# Patient Record
Sex: Female | Born: 1955 | Race: Black or African American | Hispanic: No | Marital: Married | Smoking: Never smoker
Health system: Southern US, Community
[De-identification: ages and names within clinical notes are randomized; demographics above are authoritative.]

## PROBLEM LIST (undated history)

## (undated) DIAGNOSIS — I10 Essential (primary) hypertension: Secondary | ICD-10-CM

## (undated) DIAGNOSIS — N189 Chronic kidney disease, unspecified: Secondary | ICD-10-CM

## (undated) DIAGNOSIS — M1990 Unspecified osteoarthritis, unspecified site: Secondary | ICD-10-CM

## (undated) DIAGNOSIS — K5090 Crohn's disease, unspecified, without complications: Secondary | ICD-10-CM

## (undated) DIAGNOSIS — K624 Stenosis of anus and rectum: Secondary | ICD-10-CM

## (undated) DIAGNOSIS — K209 Esophagitis, unspecified without bleeding: Secondary | ICD-10-CM

## (undated) DIAGNOSIS — E785 Hyperlipidemia, unspecified: Secondary | ICD-10-CM

## (undated) DIAGNOSIS — K649 Unspecified hemorrhoids: Secondary | ICD-10-CM

## (undated) DIAGNOSIS — I5189 Other ill-defined heart diseases: Secondary | ICD-10-CM

## (undated) DIAGNOSIS — K219 Gastro-esophageal reflux disease without esophagitis: Secondary | ICD-10-CM

## (undated) DIAGNOSIS — E119 Type 2 diabetes mellitus without complications: Secondary | ICD-10-CM

## (undated) DIAGNOSIS — K449 Diaphragmatic hernia without obstruction or gangrene: Secondary | ICD-10-CM

## (undated) HISTORY — DX: Stenosis of anus and rectum: K62.4

## (undated) HISTORY — DX: Esophagitis, unspecified: K20.9

## (undated) HISTORY — DX: Gastro-esophageal reflux disease without esophagitis: K21.9

## (undated) HISTORY — DX: Unspecified hemorrhoids: K64.9

## (undated) HISTORY — DX: Diaphragmatic hernia without obstruction or gangrene: K44.9

## (undated) HISTORY — DX: Chronic kidney disease, unspecified: N18.9

## (undated) HISTORY — DX: Esophagitis, unspecified without bleeding: K20.90

## (undated) HISTORY — DX: Hyperlipidemia, unspecified: E78.5

## (undated) HISTORY — DX: Other ill-defined heart diseases: I51.89

## (undated) HISTORY — DX: Essential (primary) hypertension: I10

## (undated) HISTORY — DX: Unspecified osteoarthritis, unspecified site: M19.90

## (undated) HISTORY — PX: BREAST BIOPSY: SHX20

## (undated) HISTORY — DX: Crohn's disease, unspecified, without complications: K50.90

---

## 1998-09-08 ENCOUNTER — Other Ambulatory Visit: Admission: RE | Admit: 1998-09-08 | Discharge: 1998-09-08 | Payer: Self-pay | Admitting: Internal Medicine

## 1999-09-10 ENCOUNTER — Other Ambulatory Visit: Admission: RE | Admit: 1999-09-10 | Discharge: 1999-09-10 | Payer: Self-pay | Admitting: Internal Medicine

## 2000-09-12 ENCOUNTER — Other Ambulatory Visit: Admission: RE | Admit: 2000-09-12 | Discharge: 2000-09-12 | Payer: Self-pay | Admitting: Internal Medicine

## 2001-09-13 ENCOUNTER — Other Ambulatory Visit: Admission: RE | Admit: 2001-09-13 | Discharge: 2001-09-13 | Payer: Self-pay | Admitting: Internal Medicine

## 2001-09-22 ENCOUNTER — Encounter: Admission: RE | Admit: 2001-09-22 | Discharge: 2001-12-21 | Payer: Self-pay | Admitting: Internal Medicine

## 2003-01-04 ENCOUNTER — Other Ambulatory Visit: Admission: RE | Admit: 2003-01-04 | Discharge: 2003-01-04 | Payer: Self-pay | Admitting: Internal Medicine

## 2003-03-11 ENCOUNTER — Encounter: Admission: RE | Admit: 2003-03-11 | Discharge: 2003-03-11 | Payer: Self-pay | Admitting: Internal Medicine

## 2003-04-03 ENCOUNTER — Ambulatory Visit (HOSPITAL_COMMUNITY): Admission: RE | Admit: 2003-04-03 | Discharge: 2003-04-03 | Payer: Self-pay | Admitting: Internal Medicine

## 2004-01-29 ENCOUNTER — Other Ambulatory Visit: Admission: RE | Admit: 2004-01-29 | Discharge: 2004-01-29 | Payer: Self-pay | Admitting: Internal Medicine

## 2004-03-11 ENCOUNTER — Ambulatory Visit: Payer: Self-pay | Admitting: Internal Medicine

## 2004-03-24 ENCOUNTER — Ambulatory Visit: Payer: Self-pay | Admitting: Internal Medicine

## 2004-07-21 ENCOUNTER — Ambulatory Visit: Payer: Self-pay | Admitting: Internal Medicine

## 2004-07-29 ENCOUNTER — Encounter: Admission: RE | Admit: 2004-07-29 | Discharge: 2004-07-29 | Payer: Self-pay | Admitting: Internal Medicine

## 2005-03-24 ENCOUNTER — Ambulatory Visit: Payer: Self-pay | Admitting: Internal Medicine

## 2005-05-04 ENCOUNTER — Emergency Department (HOSPITAL_COMMUNITY): Admission: EM | Admit: 2005-05-04 | Discharge: 2005-05-04 | Payer: Self-pay | Admitting: Emergency Medicine

## 2005-05-17 ENCOUNTER — Ambulatory Visit: Payer: Self-pay | Admitting: Internal Medicine

## 2005-05-25 ENCOUNTER — Ambulatory Visit: Payer: Self-pay | Admitting: Hospitalist

## 2006-04-20 ENCOUNTER — Emergency Department (HOSPITAL_COMMUNITY): Admission: EM | Admit: 2006-04-20 | Discharge: 2006-04-20 | Payer: Self-pay | Admitting: Emergency Medicine

## 2006-05-24 ENCOUNTER — Telehealth: Payer: Self-pay | Admitting: *Deleted

## 2006-05-30 DIAGNOSIS — E785 Hyperlipidemia, unspecified: Secondary | ICD-10-CM | POA: Insufficient documentation

## 2006-05-30 DIAGNOSIS — J45909 Unspecified asthma, uncomplicated: Secondary | ICD-10-CM | POA: Insufficient documentation

## 2006-05-30 DIAGNOSIS — I10 Essential (primary) hypertension: Secondary | ICD-10-CM | POA: Insufficient documentation

## 2006-06-01 ENCOUNTER — Ambulatory Visit: Payer: Self-pay | Admitting: Internal Medicine

## 2006-06-01 ENCOUNTER — Encounter (INDEPENDENT_AMBULATORY_CARE_PROVIDER_SITE_OTHER): Payer: Self-pay | Admitting: Unknown Physician Specialty

## 2006-06-01 ENCOUNTER — Ambulatory Visit (HOSPITAL_COMMUNITY): Admission: RE | Admit: 2006-06-01 | Discharge: 2006-06-01 | Payer: Self-pay | Admitting: Internal Medicine

## 2006-06-01 DIAGNOSIS — R809 Proteinuria, unspecified: Secondary | ICD-10-CM | POA: Insufficient documentation

## 2006-06-01 DIAGNOSIS — E119 Type 2 diabetes mellitus without complications: Secondary | ICD-10-CM | POA: Insufficient documentation

## 2006-06-01 DIAGNOSIS — M79609 Pain in unspecified limb: Secondary | ICD-10-CM | POA: Insufficient documentation

## 2006-06-01 HISTORY — DX: Proteinuria, unspecified: R80.9

## 2006-06-01 LAB — CONVERTED CEMR LAB
ALT: 12 units/L (ref 0–35)
AST: 13 units/L (ref 0–37)
Albumin: 4.1 g/dL (ref 3.5–5.2)
Alkaline Phosphatase: 107 units/L (ref 39–117)
BUN: 13 mg/dL (ref 6–23)
CO2: 26 meq/L (ref 19–32)
Calcium: 9.6 mg/dL (ref 8.4–10.5)
Chloride: 102 meq/L (ref 96–112)
Cholesterol: 210 mg/dL — ABNORMAL HIGH (ref 0–200)
Creatinine, Ser: 1.11 mg/dL (ref 0.40–1.20)
Glucose, Bld: 128 mg/dL
Glucose, Bld: 110 mg/dL — ABNORMAL HIGH (ref 70–99)
HDL: 41 mg/dL (ref >39)
Hgb A1c MFr Bld: 6.3 %
LDL Cholesterol: 128 mg/dL — ABNORMAL HIGH (ref 0–99)
Potassium: 4.8 meq/L (ref 3.5–5.3)
Sodium: 141 meq/L (ref 135–145)
Total Bilirubin: 0.3 mg/dL (ref 0.3–1.2)
Total CHOL/HDL Ratio: 5.1
Total Protein: 7.7 g/dL (ref 6.0–8.3)
Triglycerides: 203 mg/dL — ABNORMAL HIGH (ref <150)
VLDL: 41 mg/dL — ABNORMAL HIGH (ref 0–40)

## 2006-06-07 ENCOUNTER — Encounter (INDEPENDENT_AMBULATORY_CARE_PROVIDER_SITE_OTHER): Payer: Self-pay | Admitting: Unknown Physician Specialty

## 2006-06-07 ENCOUNTER — Encounter (INDEPENDENT_AMBULATORY_CARE_PROVIDER_SITE_OTHER): Payer: Self-pay | Admitting: *Deleted

## 2006-06-15 ENCOUNTER — Ambulatory Visit: Payer: Self-pay | Admitting: Internal Medicine

## 2006-06-15 ENCOUNTER — Encounter (INDEPENDENT_AMBULATORY_CARE_PROVIDER_SITE_OTHER): Payer: Self-pay | Admitting: Unknown Physician Specialty

## 2006-08-08 ENCOUNTER — Telehealth: Payer: Self-pay | Admitting: *Deleted

## 2006-12-27 ENCOUNTER — Telehealth (INDEPENDENT_AMBULATORY_CARE_PROVIDER_SITE_OTHER): Payer: Self-pay | Admitting: *Deleted

## 2007-08-03 ENCOUNTER — Telehealth (INDEPENDENT_AMBULATORY_CARE_PROVIDER_SITE_OTHER): Payer: Self-pay | Admitting: *Deleted

## 2007-09-24 ENCOUNTER — Ambulatory Visit (HOSPITAL_COMMUNITY): Admission: RE | Admit: 2007-09-24 | Discharge: 2007-09-24 | Payer: Self-pay | Admitting: Family Medicine

## 2007-12-22 LAB — CONVERTED CEMR LAB: Pap Smear: NORMAL

## 2008-02-29 ENCOUNTER — Ambulatory Visit: Payer: Self-pay | Admitting: *Deleted

## 2008-03-04 ENCOUNTER — Telehealth (INDEPENDENT_AMBULATORY_CARE_PROVIDER_SITE_OTHER): Payer: Self-pay | Admitting: *Deleted

## 2008-03-28 ENCOUNTER — Ambulatory Visit: Payer: Self-pay | Admitting: Diagnostic Radiology

## 2008-03-28 ENCOUNTER — Emergency Department (HOSPITAL_BASED_OUTPATIENT_CLINIC_OR_DEPARTMENT_OTHER): Admission: EM | Admit: 2008-03-28 | Discharge: 2008-03-28 | Payer: Self-pay | Admitting: Emergency Medicine

## 2008-04-02 ENCOUNTER — Ambulatory Visit: Payer: Self-pay | Admitting: *Deleted

## 2008-04-02 LAB — CONVERTED CEMR LAB
ALT: 17 units/L (ref 0–35)
AST: 17 units/L (ref 0–37)
Albumin: 3.6 g/dL (ref 3.5–5.2)
Alkaline Phosphatase: 93 units/L (ref 39–117)
BUN: 12 mg/dL (ref 6–23)
Basophils Absolute: 0 10*3/uL (ref 0.0–0.1)
Basophils Relative: 0.6 % (ref 0.0–3.0)
CO2: 33 meq/L — ABNORMAL HIGH (ref 19–32)
Calcium: 9.8 mg/dL (ref 8.4–10.5)
Chloride: 99 meq/L (ref 96–112)
Cholesterol: 185 mg/dL (ref 0–200)
Creatinine, Ser: 1.1 mg/dL (ref 0.4–1.2)
Creatinine,U: 103 mg/dL
Eosinophils Absolute: 0.2 10*3/uL (ref 0.0–0.7)
Eosinophils Relative: 2.9 % (ref 0.0–5.0)
GFR calc Af Amer: 67 mL/min
GFR calc non Af Amer: 55 mL/min
Glucose, Bld: 133 mg/dL — ABNORMAL HIGH (ref 70–99)
HCT: 38.4 % (ref 36.0–46.0)
HDL: 40.3 mg/dL (ref >39.0)
Hemoglobin: 12.5 g/dL (ref 12.0–15.0)
Hgb A1c MFr Bld: 7 % — ABNORMAL HIGH (ref 4.6–6.0)
LDL Cholesterol: 110 mg/dL — ABNORMAL HIGH (ref 0–99)
Lymphocytes Relative: 38.4 % (ref 12.0–46.0)
MCHC: 32.7 g/dL (ref 30.0–36.0)
MCV: 74.6 fL — ABNORMAL LOW (ref 78.0–100.0)
Microalb Creat Ratio: 2.9 mg/g (ref 0.0–30.0)
Microalb, Ur: 0.3 mg/dL (ref 0.0–1.9)
Monocytes Absolute: 0.3 10*3/uL (ref 0.1–1.0)
Monocytes Relative: 5.7 % (ref 3.0–12.0)
Neutro Abs: 3.1 10*3/uL (ref 1.4–7.7)
Neutrophils Relative %: 52.4 % (ref 43.0–77.0)
Platelets: 267 10*3/uL (ref 150–400)
Potassium: 4.1 meq/L (ref 3.5–5.1)
RBC: 5.14 M/uL — ABNORMAL HIGH (ref 3.87–5.11)
RDW: 14.5 % (ref 11.5–14.6)
Sodium: 138 meq/L (ref 135–145)
TSH: 2.52 microintl units/mL (ref 0.35–5.50)
Total Bilirubin: 0.5 mg/dL (ref 0.3–1.2)
Total CHOL/HDL Ratio: 4.6
Total Protein: 7.7 g/dL (ref 6.0–8.3)
Triglycerides: 176 mg/dL — ABNORMAL HIGH (ref 0–149)
VLDL: 35 mg/dL (ref 0–40)
WBC: 5.8 10*3/uL (ref 4.5–10.5)

## 2008-06-17 ENCOUNTER — Telehealth (INDEPENDENT_AMBULATORY_CARE_PROVIDER_SITE_OTHER): Payer: Self-pay | Admitting: *Deleted

## 2008-07-15 ENCOUNTER — Ambulatory Visit: Payer: Self-pay | Admitting: *Deleted

## 2008-07-15 LAB — CONVERTED CEMR LAB
ALT: 16 units/L (ref 0–35)
AST: 19 units/L (ref 0–37)
Albumin: 3.7 g/dL (ref 3.5–5.2)
Alkaline Phosphatase: 117 units/L (ref 39–117)
BUN: 10 mg/dL (ref 6–23)
CO2: 30 meq/L (ref 19–32)
Calcium: 9.3 mg/dL (ref 8.4–10.5)
Chloride: 103 meq/L (ref 96–112)
Creatinine, Ser: 1 mg/dL (ref 0.4–1.2)
GFR calc non Af Amer: 74.04 mL/min (ref >60)
Glucose, Bld: 93 mg/dL (ref 70–99)
Potassium: 4.4 meq/L (ref 3.5–5.1)
Sodium: 140 meq/L (ref 135–145)
Total Bilirubin: 0.5 mg/dL (ref 0.3–1.2)
Total Protein: 7.3 g/dL (ref 6.0–8.3)

## 2008-07-17 ENCOUNTER — Ambulatory Visit: Payer: Self-pay | Admitting: *Deleted

## 2008-07-17 DIAGNOSIS — Z8669 Personal history of other diseases of the nervous system and sense organs: Secondary | ICD-10-CM

## 2008-07-17 DIAGNOSIS — G43009 Migraine without aura, not intractable, without status migrainosus: Secondary | ICD-10-CM | POA: Insufficient documentation

## 2008-07-17 HISTORY — DX: Personal history of other diseases of the nervous system and sense organs: Z86.69

## 2008-07-29 ENCOUNTER — Ambulatory Visit: Payer: Self-pay | Admitting: *Deleted

## 2008-08-01 ENCOUNTER — Encounter: Payer: Self-pay | Admitting: Internal Medicine

## 2008-08-20 ENCOUNTER — Telehealth: Payer: Self-pay | Admitting: Internal Medicine

## 2008-09-03 ENCOUNTER — Ambulatory Visit: Payer: Self-pay | Admitting: Family Medicine

## 2008-09-03 ENCOUNTER — Telehealth: Payer: Self-pay | Admitting: Family Medicine

## 2008-09-03 LAB — CONVERTED CEMR LAB
ALT: 17 units/L (ref 0–35)
AST: 19 units/L (ref 0–37)
Albumin: 3.5 g/dL (ref 3.5–5.2)
Alkaline Phosphatase: 124 units/L — ABNORMAL HIGH (ref 39–117)
BUN: 13 mg/dL (ref 6–23)
CO2: 32 meq/L (ref 19–32)
Calcium: 9.3 mg/dL (ref 8.4–10.5)
Chloride: 103 meq/L (ref 96–112)
Cholesterol: 165 mg/dL (ref 0–200)
Creatinine, Ser: 0.9 mg/dL (ref 0.4–1.2)
GFR calc non Af Amer: 83.57 mL/min (ref >60)
Glucose, Bld: 126 mg/dL — ABNORMAL HIGH (ref 70–99)
HDL: 38.5 mg/dL — ABNORMAL LOW (ref >39.00)
Hgb A1c MFr Bld: 6.8 % — ABNORMAL HIGH (ref 4.6–6.5)
LDL Cholesterol: 102 mg/dL — ABNORMAL HIGH (ref 0–99)
Potassium: 4.2 meq/L (ref 3.5–5.1)
Sodium: 141 meq/L (ref 135–145)
Total Bilirubin: 0.4 mg/dL (ref 0.3–1.2)
Total CHOL/HDL Ratio: 4
Total Protein: 7.5 g/dL (ref 6.0–8.3)
Triglycerides: 124 mg/dL (ref 0.0–149.0)
VLDL: 24.8 mg/dL (ref 0.0–40.0)

## 2008-09-04 ENCOUNTER — Telehealth (INDEPENDENT_AMBULATORY_CARE_PROVIDER_SITE_OTHER): Payer: Self-pay | Admitting: *Deleted

## 2009-01-31 ENCOUNTER — Other Ambulatory Visit: Admission: RE | Admit: 2009-01-31 | Discharge: 2009-01-31 | Payer: Self-pay | Admitting: Family Medicine

## 2009-01-31 ENCOUNTER — Encounter: Payer: Self-pay | Admitting: Family Medicine

## 2009-01-31 ENCOUNTER — Ambulatory Visit: Payer: Self-pay | Admitting: Family Medicine

## 2009-02-05 ENCOUNTER — Telehealth (INDEPENDENT_AMBULATORY_CARE_PROVIDER_SITE_OTHER): Payer: Self-pay | Admitting: *Deleted

## 2009-04-03 ENCOUNTER — Telehealth (INDEPENDENT_AMBULATORY_CARE_PROVIDER_SITE_OTHER): Payer: Self-pay | Admitting: *Deleted

## 2009-06-03 ENCOUNTER — Ambulatory Visit: Payer: Self-pay | Admitting: Family Medicine

## 2009-06-03 ENCOUNTER — Encounter: Payer: Self-pay | Admitting: Family Medicine

## 2009-06-03 DIAGNOSIS — R350 Frequency of micturition: Secondary | ICD-10-CM | POA: Insufficient documentation

## 2009-06-03 DIAGNOSIS — R079 Chest pain, unspecified: Secondary | ICD-10-CM | POA: Insufficient documentation

## 2009-06-03 LAB — CONVERTED CEMR LAB
Bilirubin Urine: NEGATIVE
Glucose, Urine, Semiquant: NEGATIVE
Ketones, urine, test strip: NEGATIVE
Nitrite: NEGATIVE
Protein, U semiquant: NEGATIVE
Specific Gravity, Urine: 1.01
Urobilinogen, UA: 0.2
pH: 6

## 2009-06-06 ENCOUNTER — Telehealth (INDEPENDENT_AMBULATORY_CARE_PROVIDER_SITE_OTHER): Payer: Self-pay | Admitting: *Deleted

## 2009-06-10 ENCOUNTER — Encounter (INDEPENDENT_AMBULATORY_CARE_PROVIDER_SITE_OTHER): Payer: Self-pay | Admitting: *Deleted

## 2009-06-10 DIAGNOSIS — N390 Urinary tract infection, site not specified: Secondary | ICD-10-CM | POA: Insufficient documentation

## 2009-06-10 LAB — CONVERTED CEMR LAB
ALT: 17 units/L (ref 0–35)
AST: 14 units/L (ref 0–37)
Albumin: 3.8 g/dL (ref 3.5–5.2)
Alkaline Phosphatase: 119 units/L — ABNORMAL HIGH (ref 39–117)
BUN: 9 mg/dL (ref 6–23)
Bilirubin, Direct: 0 mg/dL (ref 0.0–0.3)
CK-MB: 1.6 ng/mL (ref 0.3–4.0)
CO2: 32 meq/L (ref 19–32)
Calcium: 9.6 mg/dL (ref 8.4–10.5)
Chloride: 108 meq/L (ref 96–112)
Cholesterol: 171 mg/dL (ref 0–200)
Creatinine, Ser: 0.9 mg/dL (ref 0.4–1.2)
Creatinine,U: 134.2 mg/dL
GFR calc non Af Amer: 83.34 mL/min (ref >60)
Glucose, Bld: 112 mg/dL — ABNORMAL HIGH (ref 70–99)
HDL: 49.6 mg/dL (ref >39.00)
Hgb A1c MFr Bld: 7 % — ABNORMAL HIGH (ref 4.6–6.5)
LDL Cholesterol: 100 mg/dL — ABNORMAL HIGH (ref 0–99)
Microalb Creat Ratio: 20.9 mg/g (ref 0.0–30.0)
Microalb, Ur: 2.8 mg/dL — ABNORMAL HIGH (ref 0.0–1.9)
Potassium: 4.3 meq/L (ref 3.5–5.1)
Relative Index: 1.7 (ref 0.0–2.5)
Sodium: 145 meq/L (ref 135–145)
Total Bilirubin: 0.4 mg/dL (ref 0.3–1.2)
Total CHOL/HDL Ratio: 3
Total CK: 96 units/L (ref 7–177)
Total Protein: 8.6 g/dL — ABNORMAL HIGH (ref 6.0–8.3)
Triglycerides: 108 mg/dL (ref 0.0–149.0)
VLDL: 21.6 mg/dL (ref 0.0–40.0)

## 2009-06-23 ENCOUNTER — Ambulatory Visit: Payer: Self-pay | Admitting: Internal Medicine

## 2009-06-23 ENCOUNTER — Ambulatory Visit: Payer: Self-pay

## 2009-06-23 ENCOUNTER — Ambulatory Visit (HOSPITAL_COMMUNITY): Admission: RE | Admit: 2009-06-23 | Discharge: 2009-06-23 | Payer: Self-pay | Admitting: Family Medicine

## 2009-06-23 ENCOUNTER — Encounter: Payer: Self-pay | Admitting: Family Medicine

## 2009-06-29 DIAGNOSIS — I519 Heart disease, unspecified: Secondary | ICD-10-CM | POA: Insufficient documentation

## 2009-06-30 ENCOUNTER — Encounter (INDEPENDENT_AMBULATORY_CARE_PROVIDER_SITE_OTHER): Payer: Self-pay | Admitting: *Deleted

## 2009-07-17 ENCOUNTER — Ambulatory Visit: Payer: Self-pay | Admitting: Cardiology

## 2009-07-17 DIAGNOSIS — E669 Obesity, unspecified: Secondary | ICD-10-CM | POA: Insufficient documentation

## 2009-07-17 DIAGNOSIS — R0600 Dyspnea, unspecified: Secondary | ICD-10-CM | POA: Insufficient documentation

## 2009-07-20 LAB — CONVERTED CEMR LAB: Pro B Natriuretic peptide (BNP): 22 pg/mL (ref 0.0–100.0)

## 2009-07-28 ENCOUNTER — Telehealth (INDEPENDENT_AMBULATORY_CARE_PROVIDER_SITE_OTHER): Payer: Self-pay | Admitting: *Deleted

## 2009-07-29 ENCOUNTER — Ambulatory Visit: Payer: Self-pay

## 2009-07-29 ENCOUNTER — Encounter (HOSPITAL_COMMUNITY): Admission: RE | Admit: 2009-07-29 | Discharge: 2009-09-24 | Payer: Self-pay | Admitting: Cardiology

## 2009-07-29 ENCOUNTER — Ambulatory Visit: Payer: Self-pay | Admitting: Cardiology

## 2009-07-29 HISTORY — PX: CARDIOVASCULAR STRESS TEST: SHX262

## 2009-08-12 ENCOUNTER — Telehealth: Payer: Self-pay | Admitting: Family Medicine

## 2009-11-17 ENCOUNTER — Telehealth (INDEPENDENT_AMBULATORY_CARE_PROVIDER_SITE_OTHER): Payer: Self-pay | Admitting: *Deleted

## 2010-04-22 ENCOUNTER — Telehealth (INDEPENDENT_AMBULATORY_CARE_PROVIDER_SITE_OTHER): Payer: Self-pay | Admitting: *Deleted

## 2010-04-28 ENCOUNTER — Ambulatory Visit
Admission: RE | Admit: 2010-04-28 | Discharge: 2010-04-28 | Payer: Self-pay | Source: Home / Self Care | Attending: Family Medicine | Admitting: Family Medicine

## 2010-04-28 ENCOUNTER — Other Ambulatory Visit: Payer: Self-pay | Admitting: Family Medicine

## 2010-04-28 ENCOUNTER — Encounter: Payer: Self-pay | Admitting: Family Medicine

## 2010-04-28 LAB — BASIC METABOLIC PANEL
BUN: 14 mg/dL (ref 6–23)
CO2: 31 mEq/L (ref 19–32)
Calcium: 9.3 mg/dL (ref 8.4–10.5)
Chloride: 104 mEq/L (ref 96–112)
Creatinine, Ser: 0.8 mg/dL (ref 0.4–1.2)
GFR: 89.95 mL/min (ref >60.00)
Glucose, Bld: 90 mg/dL (ref 70–99)
Potassium: 3.8 mEq/L (ref 3.5–5.1)
Sodium: 141 mEq/L (ref 135–145)

## 2010-04-28 LAB — MICROALBUMIN / CREATININE URINE RATIO
Creatinine,U: 96.1 mg/dL
Microalb Creat Ratio: 7.2 mg/g (ref 0.0–30.0)
Microalb, Ur: 6.9 mg/dL — ABNORMAL HIGH (ref 0.0–1.9)

## 2010-04-28 LAB — LIPID PANEL
Cholesterol: 172 mg/dL (ref 0–200)
HDL: 37.9 mg/dL — ABNORMAL LOW (ref >39.00)
LDL Cholesterol: 114 mg/dL — ABNORMAL HIGH (ref 0–99)
Total CHOL/HDL Ratio: 5
Triglycerides: 102 mg/dL (ref 0.0–149.0)
VLDL: 20.4 mg/dL (ref 0.0–40.0)

## 2010-04-28 LAB — CBC WITH DIFFERENTIAL/PLATELET
Basophils Absolute: 0 10*3/uL (ref 0.0–0.1)
Basophils Relative: 0.2 % (ref 0.0–3.0)
Eosinophils Absolute: 0.2 10*3/uL (ref 0.0–0.7)
Eosinophils Relative: 3 % (ref 0.0–5.0)
HCT: 38.5 % (ref 36.0–46.0)
Hemoglobin: 12.2 g/dL (ref 12.0–15.0)
Lymphocytes Relative: 38.6 % (ref 12.0–46.0)
Lymphs Abs: 2 10*3/uL (ref 0.7–4.0)
MCHC: 31.7 g/dL (ref 30.0–36.0)
MCV: 74.1 fl — ABNORMAL LOW (ref 78.0–100.0)
Monocytes Absolute: 0.3 10*3/uL (ref 0.1–1.0)
Monocytes Relative: 5.7 % (ref 3.0–12.0)
Neutro Abs: 2.8 10*3/uL (ref 1.4–7.7)
Neutrophils Relative %: 52.5 % (ref 43.0–77.0)
Platelets: 312 10*3/uL (ref 150.0–400.0)
RBC: 5.19 Mil/uL — ABNORMAL HIGH (ref 3.87–5.11)
RDW: 16.7 % — ABNORMAL HIGH (ref 11.5–14.6)
WBC: 5.3 10*3/uL (ref 4.5–10.5)

## 2010-04-28 LAB — HEPATIC FUNCTION PANEL
ALT: 16 U/L (ref 0–35)
AST: 17 U/L (ref 0–37)
Albumin: 3.7 g/dL (ref 3.5–5.2)
Alkaline Phosphatase: 120 U/L — ABNORMAL HIGH (ref 39–117)
Bilirubin, Direct: 0 mg/dL (ref 0.0–0.3)
Total Bilirubin: 0.6 mg/dL (ref 0.3–1.2)
Total Protein: 7.5 g/dL (ref 6.0–8.3)

## 2010-04-28 LAB — CONVERTED CEMR LAB: Blood Glucose, Fingerstick: 115

## 2010-04-28 LAB — HEMOGLOBIN A1C: Hgb A1c MFr Bld: 7.1 % — ABNORMAL HIGH (ref 4.6–6.5)

## 2010-04-28 LAB — TSH: TSH: 1.47 u[IU]/mL (ref 0.35–5.50)

## 2010-04-29 ENCOUNTER — Telehealth (INDEPENDENT_AMBULATORY_CARE_PROVIDER_SITE_OTHER): Payer: Self-pay | Admitting: *Deleted

## 2010-05-15 ENCOUNTER — Ambulatory Visit
Admission: RE | Admit: 2010-05-15 | Discharge: 2010-05-15 | Payer: Self-pay | Source: Home / Self Care | Attending: Family Medicine | Admitting: Family Medicine

## 2010-05-15 ENCOUNTER — Encounter: Payer: Self-pay | Admitting: Family Medicine

## 2010-05-15 ENCOUNTER — Other Ambulatory Visit: Payer: Self-pay | Admitting: Family Medicine

## 2010-05-15 DIAGNOSIS — R740 Nonspecific elevation of levels of transaminase and lactic acid dehydrogenase [LDH]: Secondary | ICD-10-CM

## 2010-05-15 LAB — HEPATIC FUNCTION PANEL
ALT: 17 U/L (ref 0–35)
AST: 17 U/L (ref 0–37)
Albumin: 3.8 g/dL (ref 3.5–5.2)
Alkaline Phosphatase: 143 U/L — ABNORMAL HIGH (ref 39–117)
Bilirubin, Direct: 0.1 mg/dL (ref 0.0–0.3)
Total Bilirubin: 0.3 mg/dL (ref 0.3–1.2)
Total Protein: 7.7 g/dL (ref 6.0–8.3)

## 2010-05-17 ENCOUNTER — Encounter: Payer: Self-pay | Admitting: Family Medicine

## 2010-05-20 ENCOUNTER — Encounter
Admission: RE | Admit: 2010-05-20 | Discharge: 2010-05-20 | Payer: Self-pay | Source: Home / Self Care | Attending: Family Medicine | Admitting: Family Medicine

## 2010-05-20 ENCOUNTER — Telehealth: Payer: Self-pay | Admitting: Family Medicine

## 2010-05-24 LAB — CONVERTED CEMR LAB
ALT: 15 units/L (ref 0–35)
AST: 18 units/L (ref 0–37)
Albumin: 3.7 g/dL (ref 3.5–5.2)
Alkaline Phosphatase: 113 units/L (ref 39–117)
BUN: 9 mg/dL (ref 6–23)
Basophils Absolute: 0 10*3/uL (ref 0.0–0.1)
Basophils Relative: 0.6 % (ref 0.0–3.0)
Bilirubin, Direct: 0.1 mg/dL (ref 0.0–0.3)
CO2: 24 meq/L (ref 19–32)
Calcium: 9.5 mg/dL (ref 8.4–10.5)
Chloride: 106 meq/L (ref 96–112)
Cholesterol: 173 mg/dL (ref 0–200)
Creatinine, Ser: 1 mg/dL (ref 0.4–1.2)
Eosinophils Absolute: 0.1 10*3/uL (ref 0.0–0.7)
Eosinophils Relative: 2.1 % (ref 0.0–5.0)
GFR calc non Af Amer: 73.89 mL/min (ref >60)
Glucose, Bld: 114 mg/dL — ABNORMAL HIGH (ref 70–99)
HCT: 37.2 % (ref 36.0–46.0)
HDL: 37.5 mg/dL — ABNORMAL LOW (ref >39.00)
Hemoglobin: 11.9 g/dL — ABNORMAL LOW (ref 12.0–15.0)
Hgb A1c MFr Bld: 6.8 % — ABNORMAL HIGH (ref 4.6–6.5)
LDL Cholesterol: 120 mg/dL — ABNORMAL HIGH (ref 0–99)
Lymphocytes Relative: 38.7 % (ref 12.0–46.0)
Lymphs Abs: 2 10*3/uL (ref 0.7–4.0)
MCHC: 32 g/dL (ref 30.0–36.0)
MCV: 75.4 fL — ABNORMAL LOW (ref 78.0–100.0)
Monocytes Absolute: 0.4 10*3/uL (ref 0.1–1.0)
Monocytes Relative: 7.3 % (ref 3.0–12.0)
Neutro Abs: 2.6 10*3/uL (ref 1.4–7.7)
Neutrophils Relative %: 51.3 % (ref 43.0–77.0)
Platelets: 268 10*3/uL (ref 150.0–400.0)
Potassium: 4.5 meq/L (ref 3.5–5.1)
RBC: 4.94 M/uL (ref 3.87–5.11)
RDW: 14.5 % (ref 11.5–14.6)
Sodium: 142 meq/L (ref 135–145)
TSH: 2.03 microintl units/mL (ref 0.35–5.50)
Total Bilirubin: 0.5 mg/dL (ref 0.3–1.2)
Total CHOL/HDL Ratio: 5
Total Protein: 7.1 g/dL (ref 6.0–8.3)
Triglycerides: 78 mg/dL (ref 0.0–149.0)
VLDL: 15.6 mg/dL (ref 0.0–40.0)
WBC: 5.1 10*3/uL (ref 4.5–10.5)

## 2010-05-28 NOTE — Progress Notes (Signed)
  Phone Note Call from Patient   Summary of Call: Pt still symptompatic- final urine result not back. Per dr Etter Sjogren send in cipro 500 mg two times a day.     New/Updated Medications: CIPRO 500 MG TABS (CIPROFLOXACIN HCL) 1 by mouth two times a day for 5 days. Prescriptions: CIPRO 500 MG TABS (CIPROFLOXACIN HCL) 1 by mouth two times a day for 5 days.  #10 x 0   Entered by:   Allyn Kenner CMA   Authorized by:   Garnet Koyanagi DO   Signed by:   Allyn Kenner CMA on 06/06/2009   Method used:   Electronically to        [facility].* (retail)       [phone]       [place], [place]  [address]       Ph: [phone]       Fax: [phone]   RxID:   [id number]

## 2010-05-28 NOTE — Assessment & Plan Note (Signed)
Summary: 2 WEEK RTO/KN   Vital Signs:  Patient profile:   55 year old female Weight:      223.4 pounds Pulse rate:   88 / minute Pulse rhythm:   regular BP sitting:   150 / 70  (left arm) Cuff size:   large  Vitals Entered By: Aron Baba CMA Deborra Medina) (May 15, 2010 9:38 AM) CC: 2 week BP f/u--thinks HCTZ may be causing chest discomfort   History of Present Illness: Pt here for bp check.  Pt states she was having chest pain on R side chest.  When she stopped all her meds she never had chest pain but had it prior to stopping everything --- has seen Dr Percival Spanish in the past.   Pt thinks it may be one of her meds.    Current Medications (verified): 1)  Centrum  Tabs (Multiple Vitamins-Minerals) .... Take 1 Tablet By Mouth Once A Day 2)  Kombiglyze Xr 08-998 Mg Xr24h-Tab (Saxagliptin-Metformin) .... Take 1 By Mouth Once Daily 3)  Ventolin Hfa 108 (90 Base) Mcg/act Aers (Albuterol Sulfate) .Marland Kitchen.. 1-2 Puffs Every 3-4 Hours As Needed Shortness of Breath 4)  Qvar 40 Mcg/act Aers (Beclomethasone Dipropionate) .Marland Kitchen.. 1 Puff Two Times A Day - Rinse Mouth After Use 5)  Bayer Low Strength 81 Mg Tbec (Aspirin) .... Take 1 Tablet By Mouth Once A Day 6)  Cozaar 100 Mg Tabs (Losartan Potassium) .Marland Kitchen.. 1 Tab By Mouth Daily 7)  Hydrochlorothiazide 12.5 Mg  Tabs (Hydrochlorothiazide) .... Take 1 Tab  By Mouth Every Morning 8)  Freestyle Freedom Lite Strips and Lancets .... Accu Check Two Times A Day 9)  Welchol 3.75 Gm Pack (Colesevelam Hcl) .Marland Kitchen.. 1 Packet Once Daily 10)  Alavert 10 Mg Tabs (Loratadine) .Marland Kitchen.. 1 By Mouth Daily 11)  Fish Oil   Oil (Fish Oil) .Marland Kitchen.. 1 Po Daily 12)  Vitamin B Complex-C   Caps (B Complex-C) .Marland Kitchen.. 1 By Mouth Daily 13)  Vitamin B-12 100 Mcg Tabs (Cyanocobalamin) .Marland Kitchen.. 1 By Mouth Daily 14)  Lipitor 20 Mg Tabs (Atorvastatin Calcium) .... Take 1 By Mouth At Bedtime 15)  Toprol Xl 50 Mg Xr24h-Tab (Metoprolol Succinate) .Marland Kitchen.. 1 By Mouth Once Daily  Allergies (verified): 1)  !  Lisinopril 2)  Pravachol  Past History:  Past Medical History: Last updated: 07/16/2009 Chronic renal insuff Cr 1.4 in 2/68 DIASTOLIC DYSFUNCTION (TMH-962.2) CHEST PAIN UNSPECIFIED (ICD-786.50) HYPERTENSION (ICD-401.9) HYPERLIPIDEMIA (ICD-272.4)   stopped meds due to Side Effect DM (ICD-250.00)  HbA1c 6.2 in 1/07 ASTHMA (ICD-493.90) UTI (ICD-599.0) FREQUENCY, URINARY (ICD-788.41) COMMON MIGRAINE (ICD-346.10) FOOT PAIN (ICD-729.5) THUMB PAIN, BILATERAL (ICD-729.5)  Past Surgical History: Last updated: 07/17/2009 C-section  Family History: Last updated: 07/17/2009 Mother - HTN, CVA at the age of 46s Aunt - s/p bil amputation due to DM No early heart disease  Social History: Last updated: 02/29/2008 Occupation:customer service in medical records Married 2 children  Risk Factors: Alcohol Use: 0 (01/31/2009) Caffeine Use: 0 (02/29/2008) Exercise: no (01/31/2009)  Risk Factors: Smoking Status: never (01/31/2009) Passive Smoke Exposure: no (02/29/2008)  Family History: Reviewed history from 07/17/2009 and no changes required. Mother - HTN, CVA at the age of 79s Aunt - s/p bil amputation due to DM No early heart disease  Social History: Reviewed history from 02/29/2008 and no changes required. 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 Lungs:  Normal respiratory effort, chest expands symmetrically. Lungs are clear to auscultation, no crackles or wheezes. Heart:  normal rate and no murmur.   Extremities:  No clubbing, cyanosis, edema, or deformity noted with normal full range of motion of all joints.   Psych:  Oriented X3 and normally interactive.     Impression & Recommendations:  Problem # 1:  HYPERTENSION  (ICD-401.9) stop norvasc ----take metoprolol The following medications were removed from the medication list:    Norvasc 10 Mg Tabs (Amlodipine besylate) .Marland Kitchen... 1 by mouth once daily Her updated medication list for this problem includes:    Cozaar 100 Mg Tabs (Losartan potassium) .Marland Kitchen... 1 tab by mouth daily    Hydrochlorothiazide 12.5 Mg Tabs (Hydrochlorothiazide) .Marland Kitchen... Take 1 tab  by mouth every morning    Toprol Xl 50 Mg Xr24h-tab (Metoprolol succinate) .Marland Kitchen... 1 by mouth once daily  BP today: 150/70 Prior BP: 160/102 (04/28/2010)  Labs Reviewed: K+: 3.8 (04/28/2010) Creat: : 0.8 (04/28/2010)   Chol: 172 (04/28/2010)   HDL: 37.90 (04/28/2010)   LDL: 114 (04/28/2010)   TG: 102.0 (04/28/2010)  Orders: EKG w/ Interpretation (93000)  Problem # 2:  CHEST PAIN UNSPECIFIED (ICD-786.50)  Orders: EKG w/ Interpretation (93000)  Complete Medication List: 1)  Centrum Tabs (Multiple vitamins-minerals) .... Take 1 tablet by mouth once a day 2)  Kombiglyze Xr 08-998 Mg Xr24h-tab (Saxagliptin-metformin) .... Take 1 by mouth once daily 3)  Ventolin Hfa 108 (90 Base) Mcg/act Aers (Albuterol sulfate) .Marland Kitchen.. 1-2 puffs every 3-4 hours as needed shortness of breath 4)  Qvar 40 Mcg/act Aers (Beclomethasone dipropionate) .Marland Kitchen.. 1 puff two times a day - rinse mouth after use 5)  Bayer Low Strength 81 Mg Tbec (Aspirin) .... Take 1 tablet by mouth once a day 6)  Cozaar 100 Mg Tabs (Losartan potassium) .Marland Kitchen.. 1 tab by mouth daily 7)  Hydrochlorothiazide 12.5 Mg Tabs (Hydrochlorothiazide) .... Take 1 tab  by mouth every morning 8)  Freestyle Freedom Lite Strips and Lancets  .... Accu check two times a day 9)  Welchol 3.75 Gm Pack (Colesevelam hcl) .Marland Kitchen.. 1 packet once daily 10)  Alavert 10 Mg Tabs (Loratadine) .Marland Kitchen.. 1 by mouth daily 11)  Fish Oil Oil (Fish oil) .Marland Kitchen.. 1 po daily 12)  Vitamin B Complex-c Caps (B complex-c) .Marland Kitchen.. 1 by mouth daily 13)  Vitamin B-12 100 Mcg Tabs (Cyanocobalamin) .Marland Kitchen.. 1 by mouth daily 14)   Lipitor 20 Mg Tabs (Atorvastatin calcium) .... Take 1 by mouth at bedtime 15)  Toprol Xl 50 Mg Xr24h-tab (Metoprolol succinate) .Marland Kitchen.. 1 by mouth once daily  Other Orders: Venipuncture (88916) TLB-Hepatic/Liver Function Pnl (80076-HEPATIC) T- * Misc. Laboratory test [phone]) Specimen Handling [phone])  Patient Instructions: 1)  Please schedule a follow-up appointment in 2 weeks.  2)  Stop norvasc --- take metoprolol Prescriptions: TOPROL XL 50 MG XR24H-TAB (METOPROLOL SUCCINATE) 1 by mouth once daily  #30 x 2   Entered and Authorized by:   Garnet Koyanagi DO   Signed by:   Garnet Koyanagi DO on 05/15/2010   Method used:   Electronically to        [facility].* (retail)       [phone]       [place], [place]  [address]       Ph: [phone]       Fax: [phone]   RxID:   [id number]  Orders Added: 1)  Venipuncture [75830] 2)  TLB-Hepatic/Liver Function Pnl [80076-HEPATIC] 3)  T- * Misc. Laboratory test [99999] 4)  Specimen Handling [99000] 5)  Est. Patient Level III [74600] 6)  EKG w/ Interpretation [93000]

## 2010-05-28 NOTE — Miscellaneous (Signed)
   Clinical Lists Changes  Problems: Added new problem of UTI (ICD-599.0) Orders: Added new Test order of T-Culture, Urine ([id number]) - Signed

## 2010-05-28 NOTE — Assessment & Plan Note (Signed)
Summary: Cardiology Nuclear Study  Nuclear Med Background Indications for Stress Test: Evaluation for Ischemia   History: Asthma, Echo  History Comments: 2/11 Echo:EF=60-65%   Symptoms: Chest Pain, Chest Pain with Exertion, DOE, Fatigue  Symptoms Comments: CP>back. Last episode of CP:2 weeks ago.   Nuclear Pre-Procedure Cardiac Risk Factors: Hypertension, Lipids, NIDDM, Obesity Caffeine/Decaff Intake: None NPO After: 7:30 PM Lungs: Clear.  Ventolin inhaler used prior to exercise.  O2 Sat 98% on RA. IV 0.9% NS with Angio Cath: 22g     IV Site: (R) AC IV Started by: Irven Baltimore RN Chest Size (in) 42     Cup Size D     Height (in): 60 Weight (lb): 218 BMI: 42.73  Nuclear Med Study 1 or 2 day study:  1 day     Stress Test Type:  Stress Reading MD:  Kirk Ruths, MD     Referring MD:  Minus Breeding, MD Resting Radionuclide:  Technetium 38mTetrofosmin     Resting Radionuclide Dose:  11 mCi  Stress Radionuclide:  Technetium 971metrofosmin     Stress Radionuclide Dose:  33 mCi   Stress Protocol Exercise Time (min):  5:30 min     Max HR:  160 bpm     Predicted Max HR:  16423pm  Max Systolic BP: 15953m Hg     Percent Max HR:  97.56 %     METS: 7.0 Rate Pressure Product:  [id number]  Stress Test Technologist:  ShValetta FullerMA-N     Nuclear Technologist:  StCharlton AmorNMT  Rest Procedure  Myocardial perfusion imaging was performed at rest 45 minutes following the intravenous administration of Myoview Technetium 9925mtrofosmin.  Stress Procedure  The patient exercised for 5:30.  The patient stopped due to fatigue.  She c/o nonspecific chest pain, 7/10, immediately post exercise. There were no diagnostic ST-T wave changes.  Myoview was injected at peak exercise and myocardial perfusion imaging was performed after a brief delay.  QPS Raw Data Images:  Mild breast attenuation.  Normal left ventricular size. Stress Images:  There is decreased uptake in the anterior  wall. Rest Images:  There is decreased uptake in the anterior wall. Subtraction (SDS):  There is a fixed anterior defect that is most consistent with breast attenuation. Transient Ischemic Dilatation:  .94  (Normal <1.22)  Lung/Heart Ratio:  .23  (Normal <0.45)  Quantitative Gated Spect Images QGS EDV:  70 ml QGS ESV:  21 ml QGS EF:  70 % QGS cine images:  Normal wall motion.   Overall Impression  Exercise Capacity: Fair exercise capacity. BP Response: Normal blood pressure response. Clinical Symptoms: There is  chest pain ECG Impression: Insignificant upsloping ST segment depression. Overall Impression: There is breast attenuation but no sign of scar or ischemia.  Appended Document: Cardiology Nuclear Study [place].  No evidence of ischemia.  Appended Document: Cardiology Nuclear Study pt aware of results

## 2010-05-28 NOTE — Progress Notes (Signed)
Summary: Results--   Phone Note Outgoing Call   Call placed by: Aron Baba CMA Deborra Medina),  April 29, 2010 2:01 PM Call placed to: Patient Details for Reason: Pt needs kombiglyza  08/998 1 by mouth once daily #30  2 refills----give coupon with rx alk phos high--  repeat lfts,  alk phos fractionated. GGT  790.4 Ideally your LDL (bad cholesterol) should be <_70___, your HDL (good cholesterol) should be >_40__ and your triglycerides should be< 150.  Diet and exercise will increase HDL and decrease the LDL and triglycerides. Read Dr. Langston Masker book--Eat Drink and Be Healthy.  We will recheck labs in __3_ months. Start Lipitor 20 mg #30  1 by mouth at bedtime ,  2 refills 250.00  272.4    hgba1c, hep, bmp, lipid   Summary of Call: Discuss with patient, Rx sent, copy of labs and coupon placed up front for pick-up.............Marland KitchenFelecia Deloach CMA  April 29, 2010 4:51 PM     New/Updated Medications: KOMBIGLYZE XR 08-998 MG XR24H-TAB (SAXAGLIPTIN-METFORMIN) Take 1 by mouth once daily LIPITOR 20 MG TABS (ATORVASTATIN CALCIUM) Take 1 by mouth at bedtime Prescriptions: LIPITOR 20 MG TABS (ATORVASTATIN CALCIUM) Take 1 by mouth at bedtime  #30 x 2   Entered by:   Rolla Flatten CMA   Authorized by:   Garnet Koyanagi DO   Signed by:   Rolla Flatten CMA on 04/29/2010   Method used:   Faxed to ...       [facility].* (retail)       [phone]       [place], [place]  [address]       Ph: [phone]       Fax: [phone]   RxID:   [id number] KOMBIGLYZE XR 08-998 MG XR24H-TAB (SAXAGLIPTIN-METFORMIN) Take 1 by mouth once daily  #30 x 2   Entered by:   Rolla Flatten CMA   Authorized by:   Garnet Koyanagi DO   Signed by:   Rolla Flatten CMA on 04/29/2010   Method used:   Faxed to ...       [facility].* (retail)       [phone]       [place], [place]  [address]       Ph: [phone]       Fax:  [phone]   RxID:   [id number]

## 2010-05-28 NOTE — Progress Notes (Signed)
Summary: REFILL REQUEST  Phone Note Refill Request Call back at [phone] Message from:  Pharmacy on April 22, 2010 9:33 AM  Refills Requested: Medication #1:  HYDROCHLOROTHIAZIDE 12.5 MG  TABS Take 1 tab  by mouth every morning   Dosage confirmed as above?Dosage Confirmed   Supply Requested: 1 month [place]  Next Appointment Scheduled: 1.3.12 Initial call taken by: Osborn Coho,  April 22, 2010 9:34 AM    Prescriptions: HYDROCHLOROTHIAZIDE 12.5 MG  TABS (HYDROCHLOROTHIAZIDE) Take 1 tab  by mouth every morning  #30 x 0   Entered by:   Malachi Bonds CMA   Authorized by:   Annye Asa MD   Signed by:   Malachi Bonds CMA on 04/22/2010   Method used:   Electronically to        [facility] (retail)       [phone]       [place], [place]  [address]       Ph: [phone]       Fax: [phone]   RxID:   [id number]

## 2010-05-28 NOTE — Progress Notes (Signed)
Summary: Wants to discuss meds  Phone Note Call from Patient   Caller: Patient Details for Reason: wants to discuss meds Summary of Call: call from patient wanted to schedule an appt with Dr.Lowne to discuss BP meds. Stated her BP has been running high, and she thinks she needs more affordable meds as well, I offered the patient and appt to come in a seen another provider due to Dr. Etter Sjogren being out of town and the patient declined an OV this week. Stated she can wait until next week. Her BP has been 160/90, I advised that is elevated and if she starts to feel bad before her appt, she will need to be seen here, go to the ED or urgent care, she stated she will be ok until seen on tuesday but if she starts to feel bad she will go and be seen at the hospt. appt scheduled 04/28/10 @ 10:45... Initial call taken by: Aron Baba CMA Deborra Medina),  April 22, 2010 8:57 AM

## 2010-05-28 NOTE — Progress Notes (Signed)
Summary: losartan refill   Phone Note Refill Request Call back at [phone] Message from:  Pharmacy on November 17, 2009 8:36 AM  Refills Requested: Medication #1:  COZAAR 100 MG TABS 1 tab by mouth daily   Dosage confirmed as above?Dosage Confirmed   Supply Requested: 1 month   Last Refilled: 10/04/2009 [facility].  Next Appointment Scheduled: none Initial call taken by: Osborn Coho,  November 17, 2009 8:36 AM    Prescriptions: COZAAR 100 MG TABS (LOSARTAN POTASSIUM) 1 tab by mouth daily  #30 x 0   Entered by:   Malachi Bonds CMA   Authorized by:   Annye Asa MD   Signed by:   Malachi Bonds CMA on 11/17/2009   Method used:   Electronically to        [facility].* (retail)       [phone]       [place], [place]  [address]       Ph: [phone]       Fax: [phone]   RxID:   [id number]

## 2010-05-28 NOTE — Progress Notes (Signed)
Summary: Nuclear Pre-Procedure  Phone Note Outgoing Call   Call placed by: Perrin Maltese, EMT-P,  July 28, 2009 2:50 PM Summary of Call: Left message with information on Myoview Information Sheet (see scanned document for details).      Nuclear Med Background Indications for Stress Test: Evaluation for Ischemia   History: Asthma, Echo  History Comments: 02/11 ECHO EF 60-65%   Symptoms: Chest Pain, DOE    Nuclear Pre-Procedure Cardiac Risk Factors: Hypertension, Lipids, NIDDM Height (in): 54  Nuclear Med Study Referring MD:  J.Hochrein

## 2010-05-28 NOTE — Assessment & Plan Note (Signed)
Summary: REACTIONS TO MEDS/PULL MUSCLE AND URINATING ALOT/KDC   Vital Signs:  Patient profile:   55 year old female Height:      60 inches Weight:      221 pounds BMI:     43.32 Temp:     98.7 degrees F oral Pulse rate:   82 / minute Pulse rhythm:   regular BP sitting:   138 / 90  (left arm) Cuff size:   large  Vitals Entered By: Allyn Kenner CMA (June 03, 2009 11:41 AM) CC: Pt c/o possible reactions to meds, urinating more often, states it feels like a pulled muscle in her chest. New meds are the amlpdipine and losartan and metformin 1000   History of Present Illness:       This is a 55 year old woman who presents with Chest Pain.  The symptoms began 4-6 months ago.  Pt here for f/u of DM, Htn etc but c/o CP since starting norvasc and losartan.  It only occurs at work.   .  The patient reports exertional chest pain, shortness of breath, and palpitations, but denies resting chest pain, nausea, vomiting, diaphoresis, dizziness, light headedness, syncope, and indigestion.  The pain is described as intermittent.  The pain is located in the substernal area and the pain does not radiate.  Episodes of chest pain last 1-2 minutes.  The pain is relieved or improved with rest.    Hypertension follow-up      The patient also presents for Hypertension follow-up.  The patient complains of urinary frequency, but denies lightheadedness, headaches, edema, impotence, rash, and fatigue.  Associated symptoms include chest pain and dyspnea.  The patient denies the following associated symptoms: chest pressure, exercise intolerance, palpitations, syncope, leg edema, and pedal edema.  The patient reports that dietary compliance has been fair.  The patient reports no exercise.  Adjunctive measures currently used by the patient include salt restriction.    Type 1 diabetes mellitus follow-up      The patient is also here for Type 2 diabetes mellitus follow-up.  The patient complains of polyuria, but denies  polydipsia, blurred vision, self managed hypoglycemia, hypoglycemia requiring help, weight loss, weight gain, and numbness of extremities.  Other symptoms include chest pain.  The patient denies the following symptoms: neuropathic pain, vomiting, orthostatic symptoms, poor wound healing, intermittent claudication, vision loss, and foot ulcer.  Since the last visit the patient reports not exercising regularly and not monitoring blood glucose.  Since the last visit, the patient reports having had eye care by an ophthalmologist.    Asthma History    Initial Asthma Severity Rating:    Age range: 12+ years    Symptoms: 0-2 days/week    Nighttime Awakenings: 0-2/month    Interferes w/ normal activity: no limitations    SABA use (not for EIB): 0-2 days/week    Exacerbations requiring oral systemic steroids: 0-1/year    Asthma Severity Assessment: Intermittent    Allergies: 1)  ! Lisinopril 2)  Pravachol (Pravastatin Sodium)  Physical Exam  General:  Well-developed,well-nourished,in no acute distress; alert,appropriate and cooperative throughout examination Lungs:  Normal respiratory effort, chest expands symmetrically. Lungs are clear to auscultation, no crackles or wheezes. Heart:  normal rate and Grade  2 /6 systolic ejection murmur.   Abdomen:  Bowel sounds positive,abdomen soft and non-tender without masses, organomegaly or hernias noted. Pulses:  +2 carotid, radial, DP Psych:  Oriented X3, memory intact for recent and remote, normally interactive, and good eye  contact.     Impression & Recommendations:  Problem # 1:  CHEST PAIN UNSPECIFIED (ICD-786.50) go to ER if pain returns Orders: Echo Referral (Echo) TLB-Cardiac Panel (58099_[id number]) T-D-Dimer Fibrin Derivatives Quantitive ([id number]) EKG w/ Interpretation (93000) UA Dipstick w/o Micro (manual) (81002)  Problem # 2:  FREQUENCY, URINARY (ICD-788.41)  pt prefers to wait on treatment until culture comes  back Orders: T-Culture, Urine ([id number]) Venipuncture (29924) TLB-Lipid Panel (80061-LIPID) TLB-BMP (Basic Metabolic Panel-BMET) (26834-[id number]) TLB-Hepatic/Liver Function Pnl (80076-HEPATIC) TLB-Microalbumin/Creat Ratio, Urine (82043-MALB) TLB-A1C / Hgb A1C (Glycohemoglobin) (83036-A1C) EKG w/ Interpretation (93000) UA Dipstick w/o Micro (manual) (81002)  Discussed use of medication.   Problem # 3:  DM (ICD-250.00)  Her updated medication list for this problem includes:    Metformin Hcl 1000 Mg Tabs (Metformin hcl) .Marland Kitchen... Take one tablet two times a day    Bayer Low Strength 81 Mg Tbec (Aspirin) .Marland Kitchen... Take 1 tablet by mouth once a day    Cozaar 100 Mg Tabs (Losartan potassium) .Marland Kitchen... 1 tab by mouth daily  Orders: Venipuncture (79892) TLB-Lipid Panel (80061-LIPID) TLB-BMP (Basic Metabolic Panel-BMET) (11941-DEYCXKG) TLB-Hepatic/Liver Function Pnl (80076-HEPATIC) TLB-Microalbumin/Creat Ratio, Urine (82043-MALB) TLB-A1C / Hgb A1C (Glycohemoglobin) (83036-A1C) EKG w/ Interpretation (93000) UA Dipstick w/o Micro (manual) (81002)  Labs Reviewed: Creat: 1.0 (01/31/2009)    Reviewed HgBA1c results: 6.8 (01/31/2009)  6.8 (09/03/2008)  Problem # 4:  HYPERLIPIDEMIA (ICD-272.4) pt not taking meds secondary to muscle ache---try welchol--- consider lipid clinic The following medications were removed from the medication list:    Zetia 10 Mg Tabs (Ezetimibe) .Marland Kitchen... Take one tablet daily Her updated medication list for this problem includes:    Welchol 3.75 Gm Pack (Colesevelam hcl) .Marland Kitchen... 1 packet once daily  Orders: Venipuncture (81856) TLB-Lipid Panel (80061-LIPID) TLB-BMP (Basic Metabolic Panel-BMET) (31497-WYOVZCH) TLB-Hepatic/Liver Function Pnl (80076-HEPATIC) TLB-Microalbumin/Creat Ratio, Urine (82043-MALB) TLB-A1C / Hgb A1C (Glycohemoglobin) (83036-A1C) EKG w/ Interpretation (93000) UA Dipstick w/o Micro (manual) (81002)  Labs Reviewed: SGOT: 18 (01/31/2009)   SGPT: 15  (01/31/2009)   HDL:37.50 (01/31/2009), 38.50 (09/03/2008)  LDL:120 (01/31/2009), 102 (09/03/2008)  Chol:173 (01/31/2009), 165 (09/03/2008)  Trig:78.0 (01/31/2009), 124.0 (09/03/2008)  Problem # 5:  HYPERTENSION (ICD-401.9)  Her updated medication list for this problem includes:    Cozaar 100 Mg Tabs (Losartan potassium) .Marland Kitchen... 1 tab by mouth daily    Norvasc 10 Mg Tabs (Amlodipine besylate) .Marland Kitchen... 1 by mouth once daily    Hydrochlorothiazide 12.5 Mg Tabs (Hydrochlorothiazide) .Marland Kitchen... Take 1 tab  by mouth every morning  Orders: Venipuncture (88502) TLB-Lipid Panel (80061-LIPID) TLB-BMP (Basic Metabolic Panel-BMET) (77412-INOMVEH) TLB-Hepatic/Liver Function Pnl (80076-HEPATIC) TLB-Microalbumin/Creat Ratio, Urine (82043-MALB) TLB-A1C / Hgb A1C (Glycohemoglobin) (83036-A1C) Echo Referral (Echo) EKG w/ Interpretation (93000) UA Dipstick w/o Micro (manual) (81002)  BP today: 138/90 Prior BP: 140/96 (01/31/2009)  Labs Reviewed: K+: 4.5 (01/31/2009) Creat: : 1.0 (01/31/2009)   Chol: 173 (01/31/2009)   HDL: 37.50 (01/31/2009)   LDL: 120 (01/31/2009)   TG: 78.0 (01/31/2009)  Problem # 6:  ASTHMA (ICD-493.90) Assessment: Improved  Her updated medication list for this problem includes:    Ventolin Hfa 108 (90 Base) Mcg/act Aers (Albuterol sulfate) .Marland Kitchen... 1-2 puffs every 3-4 hours as needed shortness of breath    Qvar 40 Mcg/act Aers (Beclomethasone dipropionate) .Marland Kitchen... 1 puff two times a day - rinse mouth after use  Orders: EKG w/ Interpretation (93000) UA Dipstick w/o Micro (manual) (81002)  Complete Medication List: 1)  Centrum Tabs (Multiple vitamins-minerals) .... Take 1 tablet by mouth once a day 2)  Metformin Hcl 1000  Mg Tabs (Metformin hcl) .... Take one tablet two times a day 3)  Ventolin Hfa 108 (90 Base) Mcg/act Aers (Albuterol sulfate) .Marland Kitchen.. 1-2 puffs every 3-4 hours as needed shortness of breath 4)  Qvar 40 Mcg/act Aers (Beclomethasone dipropionate) .Marland Kitchen.. 1 puff two times a day -  rinse mouth after use 5)  Bayer Low Strength 81 Mg Tbec (Aspirin) .... Take 1 tablet by mouth once a day 6)  Cozaar 100 Mg Tabs (Losartan potassium) .Marland Kitchen.. 1 tab by mouth daily 7)  Norvasc 10 Mg Tabs (Amlodipine besylate) .Marland Kitchen.. 1 by mouth once daily 8)  Hydrochlorothiazide 12.5 Mg Tabs (Hydrochlorothiazide) .... Take 1 tab  by mouth every morning 9)  Freestyle Freedom Lite Strips and Lancets  .... Accu check two times a day 10)  Welchol 3.75 Gm Pack (Colesevelam hcl) .Marland Kitchen.. 1 packet once daily Prescriptions: FREESTYLE FREEDOM LITE STRIPS AND LANCETS accu check two times a day  #60 x 11   Entered and Authorized by:   Garnet Koyanagi DO   Signed by:   Garnet Koyanagi DO on 06/03/2009   Method used:   Print then Give to Patient   RxID:   [id number] [place] 10 MG TABS (AMLODIPINE BESYLATE) 1 by mouth once daily  #30 x 2   Entered and Authorized by:   Garnet Koyanagi DO   Signed by:   Garnet Koyanagi DO on 06/03/2009   Method used:   Electronically to        [facility] (retail)       [phone]       [place], [place]  [address]       Ph: [phone]       Fax: [phone]   RxID:   [id number]   Laboratory Results   Urine Tests    Routine Urinalysis   Color: yellow Appearance: Hazy Glucose: negative   (Normal Range: Negative) Bilirubin: negative   (Normal Range: Negative) Ketone: negative   (Normal Range: Negative) Spec. Gravity: 1.010   (Normal Range: 1.003-1.035) Blood: trace-lysed   (Normal Range: Negative) pH: 6.0   (Normal Range: 5.0-8.0) Protein: negative   (Normal Range: Negative) Urobilinogen: 0.2   (Normal Range: 0-1) Nitrite: negative   (Normal Range: Negative) Leukocyte Esterace: small   (Normal Range: Negative)    CommentsAllyn Kenner CMA  June 03, 2009 11:54 AM      EKG  Procedure date:  06/03/2009  Findings:      Normal sinus rhythm with rate of:  79 bpm

## 2010-05-28 NOTE — Letter (Signed)
Summary: Primary Care Consult Scheduled Letter  [facility]   [place], [place] [address]   Phone: [phone]  Fax: [phone]      06/30/2009 MRN: [id number]  [address], [place]  [address]    Dear Ms. SMITH-MILLIKEN,    We have scheduled an appointment for you.  At the recommendation of Dr. Garnet Koyanagi, we have scheduled you a consult with Dr. Loralie Champagne of [facility] on 07-17-2009 at 1:45pm.  Their address is [address] floor, [place] [address]. The office phone number is [phone].  If this appointment day and time is not convenient for you, please feel free to call the office of the doctor you are being referred to at the number listed above and reschedule the appointment.    It is important for you to keep your scheduled appointments. We are here to make sure you are given good patient care.   Thank you,    Renee, Patient Care Coordinator [facility]

## 2010-05-28 NOTE — Progress Notes (Signed)
Summary: med reaction  Phone Note Call from Patient Call back at Home Phone [phone]   Caller: Patient Summary of Call: pt states that her DM med was changed from metformin to janumet 50/1000. Pt states that since starting med she has been experiencing nausea, loss of appetite, loss of energy.Pt states that she forgot to take med on one occasion and all symptoms resolved.Marland Kitchen Pls advise............Marland KitchenFelecia Deloach CMA  August 12, 2009 12:58 PM   Follow-up for Phone Call        give samples of lombiglyze Xr  08/998  1 by mouth once daily to try with rx to fill if no problems. Follow-up by: Garnet Koyanagi DO,  August 12, 2009 1:06 PM  Additional Follow-up for Phone Call Additional follow up Details #1::        Pt is aware. Allyn Kenner CMA  August 12, 2009 1:11 PM     New/Updated Medications: KOMBIGLYZE XR 08-998 MG XR24H-TAB (SAXAGLIPTIN-METFORMIN) 1 by mouth once daily Prescriptions: KOMBIGLYZE XR 08-998 MG XR24H-TAB (SAXAGLIPTIN-METFORMIN) 1 by mouth once daily  #30 x 2   Entered and Authorized by:   Garnet Koyanagi DO   Signed by:   Garnet Koyanagi DO on 08/12/2009   Method used:   Print then Give to Patient   RxID:   [id number]

## 2010-05-28 NOTE — Assessment & Plan Note (Signed)
Summary: discuss bp meds//kp   Vital Signs:  Patient profile:   55 year old female Height:      60 inches Weight:      221.6 pounds BMI:     43.43 Pulse rate:   72 / minute Pulse rhythm:   regular BP sitting:   160 / 102  (left arm) Cuff size:   large  Vitals Entered By: Aron Baba CMA Deborra Medina) (April 28, 2010 11:14 AM) CC: wants to discuss meds--patient has not been on any of her meds for over a month due to cost--pt is fasting CBG Result 115   History of Present Illness:  Type 1 diabetes mellitus follow-up      This is a 55 year old woman who presents with Type 2 diabetes mellitus follow-up.  Pt has been out of meds due to cost---started metformin alone.  The patient denies polyuria, polydipsia, blurred vision, self managed hypoglycemia, hypoglycemia requiring help, weight loss, weight gain, and numbness of extremities.  The patient denies the following symptoms: neuropathic pain, chest pain, vomiting, orthostatic symptoms, poor wound healing, intermittent claudication, vision loss, and foot ulcer.  Since the last visit the patient reports good dietary compliance, noncompliance with medications, not exercising regularly, and monitoring blood glucose.  The patient has been measuring capillary blood glucose before breakfast.  Since the last visit, the patient reports having had eye care by an ophthalmologist and no foot care.    Hypertension follow-up      The patient also presents for Hypertension follow-up.  The patient complains of headaches, but denies lightheadedness, urinary frequency, edema, impotence, rash, and fatigue.  The patient denies the following associated symptoms: chest pain, chest pressure, exercise intolerance, dyspnea, palpitations, syncope, leg edema, and pedal edema.  Compliance with medications (by patient report) has been near 100% and poor.  The patient reports that dietary compliance has been fair.  The patient reports no exercise.  Adjunctive measures  currently used by the patient include salt restriction.    Current Medications (verified): 1)  Centrum  Tabs (Multiple Vitamins-Minerals) .... Take 1 Tablet By Mouth Once A Day 2)  Metformin Hcl 1000 Mg Tabs (Metformin Hcl) .Marland Kitchen.. 1 By Mouth Two Times A Day 3)  Ventolin Hfa 108 (90 Base) Mcg/act Aers (Albuterol Sulfate) .Marland Kitchen.. 1-2 Puffs Every 3-4 Hours As Needed Shortness of Breath 4)  Qvar 40 Mcg/act Aers (Beclomethasone Dipropionate) .Marland Kitchen.. 1 Puff Two Times A Day - Rinse Mouth After Use 5)  Bayer Low Strength 81 Mg Tbec (Aspirin) .... Take 1 Tablet By Mouth Once A Day 6)  Cozaar 100 Mg Tabs (Losartan Potassium) .Marland Kitchen.. 1 Tab By Mouth Daily 7)  Norvasc 10 Mg Tabs (Amlodipine Besylate) .Marland Kitchen.. 1 By Mouth Once Daily 8)  Hydrochlorothiazide 12.5 Mg  Tabs (Hydrochlorothiazide) .... Take 1 Tab  By Mouth Every Morning 9)  Freestyle Freedom Lite Strips and Lancets .... Accu Check Two Times A Day 10)  Welchol 3.75 Gm Pack (Colesevelam Hcl) .Marland Kitchen.. 1 Packet Once Daily 11)  Alavert 10 Mg Tabs (Loratadine) .Marland Kitchen.. 1 By Mouth Daily 12)  Fish Oil   Oil (Fish Oil) .Marland Kitchen.. 1 Po Daily 13)  Vitamin B Complex-C   Caps (B Complex-C) .Marland Kitchen.. 1 By Mouth Daily 14)  Vitamin B-12 100 Mcg Tabs (Cyanocobalamin) .Marland Kitchen.. 1 By Mouth Daily 15)  Metformin Hcl 1000 Mg Tabs (Metformin Hcl) .Marland Kitchen.. 1 By Mouth Two Times A Day  Allergies (verified): 1)  ! Lisinopril 2)  Pravachol  Past History:  Past Medical  History: Last updated: 07/16/2009 Chronic renal insuff Cr 1.4 in 4/69 DIASTOLIC DYSFUNCTION (GEX-528.4) CHEST PAIN UNSPECIFIED (ICD-786.50) HYPERTENSION (ICD-401.9) HYPERLIPIDEMIA (ICD-272.4)   stopped meds due to Side Effect DM (ICD-250.00)  HbA1c 6.2 in 1/07 ASTHMA (ICD-493.90) UTI (ICD-599.0) FREQUENCY, URINARY (ICD-788.41) COMMON MIGRAINE (ICD-346.10) FOOT PAIN (ICD-729.5) THUMB PAIN, BILATERAL (ICD-729.5)  Past Surgical History: Last updated: 07/17/2009 C-section  Family History: Last updated: 07/17/2009 Mother - HTN, CVA at  the age of 7s Aunt - s/p bil amputation due to DM No early heart disease  Social History: Last updated: 02/29/2008 Occupation:customer service in medical records Married 2 children  Risk Factors: Alcohol Use: 0 (01/31/2009) Caffeine Use: 0 (02/29/2008) Exercise: no (01/31/2009)  Risk Factors: Smoking Status: never (01/31/2009) Passive Smoke Exposure: no (02/29/2008)  Family History: Reviewed history from 07/17/2009 and no changes required. Mother - HTN, CVA at the age of 61s Aunt - s/p bil amputation due to DM No early heart disease  Social History: Reviewed history from 02/29/2008 and no changes required. 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 Lungs:  Normal respiratory effort, chest expands symmetrically. Lungs are clear to auscultation, no crackles or wheezes. Heart:  normal rate and no murmur.   Extremities:  No clubbing, cyanosis, edema, or deformity noted with normal full range of motion of all joints.    Diabetes Management Exam:    Foot Exam (with socks and/or shoes not present):       Sensory-Pinprick/Light touch:          Left medial foot (L-4): normal          Left dorsal foot (L-5): normal          Left lateral foot (S-1): normal          Right medial foot (L-4): normal          Right dorsal foot (L-5): normal          Right lateral foot (S-1): normal       Sensory-Monofilament:          Left foot: normal          Right foot: normal       Inspection:          Left foot: normal          Right foot: normal       Nails:          Left foot: normal          Right foot: normal    Eye Exam:       Eye Exam done elsewhere   Impression & Recommendations:  Problem # 1:  HYPERTENSION (ICD-401.9) Assessment  Deteriorated  Her updated medication list for this problem includes:    Cozaar 100 Mg Tabs (Losartan potassium) .Marland Kitchen... 1 tab by mouth daily    Norvasc 10 Mg Tabs (Amlodipine besylate) .Marland Kitchen... 1 by mouth once daily    Hydrochlorothiazide 12.5 Mg Tabs (Hydrochlorothiazide) .Marland Kitchen... Take 1 tab  by mouth every morning  Orders: Venipuncture (13244) TLB-Lipid Panel (80061-LIPID) TLB-BMP (Basic Metabolic Panel-BMET) (01027-OZDGUYQ) TLB-CBC Platelet - w/Differential (85025-CBCD) TLB-Hepatic/Liver Function Pnl (80076-HEPATIC) TLB-TSH (Thyroid Stimulating Hormone) (84443-TSH) TLB-A1C / Hgb A1C (Glycohemoglobin) (83036-A1C) TLB-Microalbumin/Creat Ratio, Urine (82043-MALB) Specimen Handling (99000)  BP today: 160/102 Prior BP: 138/90 (06/03/2009)  Labs Reviewed: K+: 4.3 (06/03/2009)  Creat: : 0.9 (06/03/2009)   Chol: 171 (06/03/2009)   HDL: 49.60 (06/03/2009)   LDL: 100 (06/03/2009)   TG: 108.0 (06/03/2009)  Problem # 2:  HYPERLIPIDEMIA (ICD-272.4)  Her updated medication list for this problem includes:    Welchol 3.75 Gm Pack (Colesevelam hcl) .Marland Kitchen... 1 packet once daily  Orders: Venipuncture (23762) TLB-Lipid Panel (80061-LIPID) TLB-BMP (Basic Metabolic Panel-BMET) (83151-VOHYWVP) TLB-CBC Platelet - w/Differential (85025-CBCD) TLB-Hepatic/Liver Function Pnl (80076-HEPATIC) TLB-TSH (Thyroid Stimulating Hormone) (84443-TSH) TLB-A1C / Hgb A1C (Glycohemoglobin) (83036-A1C) TLB-Microalbumin/Creat Ratio, Urine (82043-MALB) Specimen Handling (99000)  Labs Reviewed: SGOT: 14 (06/03/2009)   SGPT: 17 (06/03/2009)   HDL:49.60 (06/03/2009), 37.50 (01/31/2009)  LDL:100 (06/03/2009), 120 (01/31/2009)  Chol:171 (06/03/2009), 173 (01/31/2009)  Trig:108.0 (06/03/2009), 78.0 (01/31/2009)  Problem # 3:  OBESITY, UNSPECIFIED (ICD-278.00)  Ht: 60 (04/28/2010)   Wt: 221.6 (04/28/2010)   BMI: 43.43 (04/28/2010)  Problem # 4:  DM (ICD-250.00)  Her updated medication list for this problem includes:     Metformin Hcl 1000 Mg Tabs (Metformin hcl) .Marland Kitchen... 1 by mouth two times a day    Bayer Low Strength 81 Mg Tbec (Aspirin) .Marland Kitchen... Take 1 tablet by mouth once a day    Cozaar 100 Mg Tabs (Losartan potassium) .Marland Kitchen... 1 tab by mouth daily  Orders: Venipuncture (71062) TLB-Lipid Panel (80061-LIPID) TLB-BMP (Basic Metabolic Panel-BMET) (69485-IOEVOJJ) TLB-CBC Platelet - w/Differential (85025-CBCD) TLB-Hepatic/Liver Function Pnl (80076-HEPATIC) TLB-TSH (Thyroid Stimulating Hormone) (84443-TSH) TLB-A1C / Hgb A1C (Glycohemoglobin) (83036-A1C) TLB-Microalbumin/Creat Ratio, Urine (82043-MALB) Specimen Handling (99000)  Labs Reviewed: Creat: 0.9 (06/03/2009)    Reviewed HgBA1c results: 7.0 (06/03/2009)  6.8 (01/31/2009)  Complete Medication List: 1)  Centrum Tabs (Multiple vitamins-minerals) .... Take 1 tablet by mouth once a day 2)  Metformin Hcl 1000 Mg Tabs (Metformin hcl) .Marland Kitchen.. 1 by mouth two times a day 3)  Ventolin Hfa 108 (90 Base) Mcg/act Aers (Albuterol sulfate) .Marland Kitchen.. 1-2 puffs every 3-4 hours as needed shortness of breath 4)  Qvar 40 Mcg/act Aers (Beclomethasone dipropionate) .Marland Kitchen.. 1 puff two times a day - rinse mouth after use 5)  Bayer Low Strength 81 Mg Tbec (Aspirin) .... Take 1 tablet by mouth once a day 6)  Cozaar 100 Mg Tabs (Losartan potassium) .Marland Kitchen.. 1 tab by mouth daily 7)  Norvasc 10 Mg Tabs (Amlodipine besylate) .Marland Kitchen.. 1 by mouth once daily 8)  Hydrochlorothiazide 12.5 Mg Tabs (Hydrochlorothiazide) .... Take 1 tab  by mouth every morning 9)  Freestyle Freedom Lite Strips and Lancets  .... Accu check two times a day 10)  Welchol 3.75 Gm Pack (Colesevelam hcl) .Marland Kitchen.. 1 packet once daily 11)  Alavert 10 Mg Tabs (Loratadine) .Marland Kitchen.. 1 by mouth daily 12)  Fish Oil Oil (Fish oil) .Marland Kitchen.. 1 po daily 13)  Vitamin B Complex-c Caps (B complex-c) .Marland Kitchen.. 1 by mouth daily 14)  Vitamin B-12 100 Mcg Tabs (Cyanocobalamin) .Marland Kitchen.. 1 by mouth daily 15)  Metformin Hcl 1000 Mg Tabs (Metformin hcl) .Marland Kitchen.. 1 by mouth two  times a day  Patient Instructions: 1)  Please schedule a follow-up appointment in 2 weeks.  Prescriptions: METFORMIN HCL 1000 MG TABS (METFORMIN HCL) 1 by mouth two times a day  #60 x 2   Entered and Authorized by:   Garnet Koyanagi DO   Signed by:   Garnet Koyanagi DO on 04/28/2010   Method used:   Electronically to        [facility] (retail)       [phone]       [place]  [place], [place]  [address]       Ph: [phone]       Fax: [phone]   RxID:   [id number] WELCHOL 3.75 GM PACK (COLESEVELAM HCL) 1 packet once daily  #30 x 2   Entered and Authorized by:   Garnet Koyanagi DO   Signed by:   Garnet Koyanagi DO on 04/28/2010   Method used:   Electronically to        [facility] (retail)       [phone]       [place], [place]  [address]       Ph: [phone]       Fax: [phone]   RxID:   [id number] HYDROCHLOROTHIAZIDE 12.5 MG  TABS (HYDROCHLOROTHIAZIDE) Take 1 tab  by mouth every morning  #90 x 3   Entered and Authorized by:   Garnet Koyanagi DO   Signed by:   Garnet Koyanagi DO on 04/28/2010   Method used:   Electronically to        [facility] (retail)       [phone]       [place], [place]  [address]       Ph: [phone]       Fax: [phone]   RxID:   [id number] NORVASC 10 MG TABS (AMLODIPINE BESYLATE) 1 by mouth once daily  #90 x 3   Entered and Authorized by:   Garnet Koyanagi DO   Signed by:   Garnet Koyanagi DO on 04/28/2010   Method used:   Electronically to        [facility] (retail)       [phone]       [place], [place]  [address]       Ph: [phone]       Fax: [phone]   RxID:   [id number] COZAAR 100 MG TABS (LOSARTAN POTASSIUM) 1 tab by mouth daily  #90 x 3   Entered and Authorized by:   Garnet Koyanagi DO   Signed by:   Garnet Koyanagi DO on 04/28/2010   Method  used:   Electronically to        [facility] (retail)       [phone]       [place], [place]  [address]       Ph: [phone]       Fax: [phone]   RxID:   [id number]    Orders Added: 1)  Venipuncture [92446] 2)  TLB-Lipid Panel [80061-LIPID] 3)  TLB-BMP (Basic Metabolic Panel-BMET) [28638-TRRNHAF] 4)  TLB-CBC Platelet - w/Differential [85025-CBCD] 5)  TLB-Hepatic/Liver Function Pnl [80076-HEPATIC] 6)  TLB-TSH (Thyroid Stimulating Hormone) [84443-TSH] 7)  TLB-A1C / Hgb A1C (Glycohemoglobin) [83036-A1C] 8)  TLB-Microalbumin/Creat Ratio, Urine [82043-MALB] 9)  Specimen Handling [99000] 10)  Est. Patient Level IV [79038]     Laboratory Results   Blood Tests     CBG Random:: 11m/dL

## 2010-05-28 NOTE — Assessment & Plan Note (Signed)
Summary: np6/DIASTOLIC DYSFUNCTION  Medications Added ALAVERT 10 MG TABS (LORATADINE) 1 by mouth daily FISH OIL   OIL (FISH OIL) 1 po daily VITAMIN B COMPLEX-C   CAPS (B COMPLEX-C) 1 by mouth daily VITAMIN B-12 100 MCG TABS (CYANOCOBALAMIN) 1 by mouth daily METFORMIN HCL 1000 MG TABS (METFORMIN HCL) 1 by mouth two times a day      Allergies Added:   Visit Type:  Initial Consult Primary Provider:  Annye Asa MD  CC:  Dyspnea and Chest Pain.  History of Present Illness: The patient presents for evaluation of chest pain and dyspnea. She said this started around January. She noticed on a couple of occasions that walking from her car into work cause significant dyspnea it required several minutes of rest. This doesn't happen routinely. It was not associated with chest discomfort, neck or arm discomfort. She thought this was different than her asthma symptoms. She hasn't been describing PND or orthopnea and has had no resting symptoms. She has had some resting chest discomfort however. This has been happening for about 6 months. She describes a midsternal discomfort that can radiate through to the back and lower chest. It seems to actually get better with deep breathing. She cannot describe it as sharp or dull. It is not like any previous symptoms. It comes and goes spontaneously. She has no jaw or or arm discomfort. She does not describe palpitations, presyncope or syncope.    Of note she did have an echocardiogram demonstrating a well-preserved ejection fraction but some evidence of diastolic dysfunction.  Current Medications (verified): 1)  Centrum  Tabs (Multiple Vitamins-Minerals) .... Take 1 Tablet By Mouth Once A Day 2)  Janumet 50-1000 Mg Tabs (Sitagliptin-Metformin Hcl) .Marland Kitchen.. 1 By Mouth Two Times A Day 3)  Ventolin Hfa 108 (90 Base) Mcg/act Aers (Albuterol Sulfate) .Marland Kitchen.. 1-2 Puffs Every 3-4 Hours As Needed Shortness of Breath 4)  Qvar 40 Mcg/act Aers (Beclomethasone Dipropionate)  .Marland Kitchen.. 1 Puff Two Times A Day - Rinse Mouth After Use 5)  Bayer Low Strength 81 Mg Tbec (Aspirin) .... Take 1 Tablet By Mouth Once A Day 6)  Cozaar 100 Mg Tabs (Losartan Potassium) .Marland Kitchen.. 1 Tab By Mouth Daily 7)  Norvasc 10 Mg Tabs (Amlodipine Besylate) .Marland Kitchen.. 1 By Mouth Once Daily 8)  Hydrochlorothiazide 12.5 Mg  Tabs (Hydrochlorothiazide) .... Take 1 Tab  By Mouth Every Morning 9)  Freestyle Freedom Lite Strips and Lancets .... Accu Check Two Times A Day 10)  Welchol 3.75 Gm Pack (Colesevelam Hcl) .Marland Kitchen.. 1 Packet Once Daily 11)  Alavert 10 Mg Tabs (Loratadine) .Marland Kitchen.. 1 By Mouth Daily 12)  Fish Oil   Oil (Fish Oil) .Marland Kitchen.. 1 Po Daily 13)  Vitamin B Complex-C   Caps (B Complex-C) .Marland Kitchen.. 1 By Mouth Daily 14)  Vitamin B-12 100 Mcg Tabs (Cyanocobalamin) .Marland Kitchen.. 1 By Mouth Daily 15)  Metformin Hcl 1000 Mg Tabs (Metformin Hcl) .Marland Kitchen.. 1 By Mouth Two Times A Day  Allergies (verified): 1)  ! Lisinopril 2)  Pravachol (Pravastatin Sodium)  Past History:  Past Medical History: Reviewed history from 07/16/2009 and no changes required. Chronic renal insuff Cr 1.4 in 7/35 DIASTOLIC DYSFUNCTION (HGD-924.2) CHEST PAIN UNSPECIFIED (ICD-786.50) HYPERTENSION (ICD-401.9) HYPERLIPIDEMIA (ICD-272.4)   stopped meds due to Side Effect DM (ICD-250.00)  HbA1c 6.2 in 1/07 ASTHMA (ICD-493.90) UTI (ICD-599.0) FREQUENCY, URINARY (ICD-788.41) COMMON MIGRAINE (ICD-346.10) FOOT PAIN (ICD-729.5) THUMB PAIN, BILATERAL (ICD-729.5)  Past Surgical History: C-section  Family History: Mother - HTN, CVA at the age of 54s  Aunt - s/p bil amputation due to DM No early heart disease  Review of Systems       As stated in the HPI and negative for all other systems.   Vital Signs:  Patient profile:   55 year old female Height:      60 inches Weight:      218 pounds BMI:     42.73 Pulse rate:   94 / minute Resp:     16 per minute  Vitals Entered By: Levora Angel, CNA (July 17, 2009 1:55 PM)  Physical Exam  General:  Well  developed, well nourished, in no acute distress. Head:  normocephalic and atraumatic Eyes:  PERRLA/EOM intact; conjunctiva and lids normal. Mouth:  Teeth, gums and palate normal. Oral mucosa normal. Neck:  Neck supple, no JVD. No masses, thyromegaly or abnormal cervical nodes. Chest Wall:  no deformities or breast masses noted Lungs:  Clear bilaterally to auscultation and percussion. Abdomen:  Bowel sounds positive; abdomen soft and non-tender without masses, organomegaly, or hernias noted. No hepatosplenomegaly, Obese Msk:  Back normal, normal gait. Muscle strength and tone normal. Extremities:  No clubbing or cyanosis. Neurologic:  Alert and oriented x 3. Skin:  Intact without lesions or rashes. Cervical Nodes:  no significant adenopathy Axillary Nodes:  no significant adenopathy Psych:  Normal affect.   Detailed Cardiovascular Exam  Neck    Carotids: Carotids full and equal bilaterally without bruits.      Neck Veins: Normal, no JVD.    Heart    Inspection: no deformities or lifts noted.      Palpation: normal PMI with no thrills palpable.      Auscultation: regular rate and rhythm, S1, S2 without murmurs, rubs, gallops, or clicks.    Vascular    Abdominal Aorta: no palpable masses, pulsations, or audible bruits.      Femoral Pulses: normal femoral pulses bilaterally.      Pedal Pulses: normal pedal pulses bilaterally.      Radial Pulses: normal radial pulses bilaterally.      Peripheral Circulation: no clubbing, cyanosis, or edema noted with normal capillary refill.     EKG  Procedure date:  06/03/2009  Findings:      normal sinus rhythm, rate 79, left axis deviation, no acute ST-T wave changes  Impression & Recommendations:  Problem # 1:  DYSPNEA (ICD-786.05) I suspect that this is multifactorial.  There is evidence of diastolic dysfunction. I will check a BNP level. Certainly ischemia could be playing a role in a patient with significant risk factors. I will rule  out obstructive coronary disease. Given her significant risk factors the pretest probability of obstructive disease is at least moderately high. Therefore, according to appropriateness guidelines, stress perfusion testing is indicated given the significant possibility of false-negative or false positive studies with treadmill testing alone in this population.  Problem # 2:  HYPERTENSION (ICD-401.9) Her blood pressure is controlled. She will continue the meds as listed. Of note we had a difficult time hearing it today but I reviewed previous readings.  Problem # 3:  DM (ICD-250.00) Her hemoglobin A1c in February was 7. She'll continue the meds as listed.  Problem # 4:  HYPERLIPIDEMIA (GNF-621.4) In February her HDL was 49.6 and LDL 100. Statistically she would benefit from addition of a statin but I will defer to her primary physicians.  Problem # 5:  OBESITY, UNSPECIFIED (ICD-278.00) I had a long discussion with the patient and I prescribed exercise and the [place]. She will begin the exercise program after the above screening.  Other Orders: Nuclear Stress Test (Nuc Stress Test) TLB-BNP (B-Natriuretic Peptide) (83880-BNPR)  Patient Instructions: 1)  Your physician recommends that you schedule a follow-up appointment as needed after testing 2)  Your physician recommends that you have  lab work today  BNP 780.05 3)  Your physician recommends that you continue on your current medications as directed. Please refer to the Current Medication list given to you today. 4)  Your physician has requested that you have an exercise stress myoview.  For further information please visit [email or web address].  Please follow instruction sheet, as given.

## 2010-05-28 NOTE — Progress Notes (Signed)
Summary: still having chest discomfort  Phone Note Call from Patient   Caller: Patient Call For: Garnet Koyanagi DO Summary of Call: Call from patient and she stated she was still having the chest discomfort that she described at her [place]. Pt has seen cardiology in the past and stated we should have the results from her visits. Wants to know what to do.Marland KitchenMarland KitchenMarland KitchenPlease advise  Initial call taken by: Aron Baba CMA Deborra Medina),  May 20, 2010 12:20 PM  Follow-up for Phone Call        She should call cardiology to let them know but if she is having CP now she should go to ER. Follow-up by: Garnet Koyanagi DO,  May 20, 2010 12:35 PM  Additional Follow-up for Phone Call Additional follow up Details #1::        Pt is aware of the above and agreed.... Aron Baba CMA Deborra Medina)  May 20, 2010 12:49 PM

## 2010-05-29 ENCOUNTER — Ambulatory Visit: Payer: Self-pay | Admitting: Family Medicine

## 2010-05-29 ENCOUNTER — Ambulatory Visit: Admit: 2010-05-29 | Payer: Self-pay | Admitting: Family Medicine

## 2010-07-06 ENCOUNTER — Encounter: Payer: Self-pay | Admitting: Family Medicine

## 2017-05-03 ENCOUNTER — Telehealth: Payer: Self-pay | Admitting: Physician Assistant

## 2017-05-03 NOTE — Telephone Encounter (Signed)
Tried to call pt to remind them of their appt tomorrow 05/04/17

## 2017-05-04 ENCOUNTER — Ambulatory Visit: Payer: Self-pay | Admitting: Physician Assistant

## 2017-05-04 ENCOUNTER — Encounter: Payer: Self-pay | Admitting: Physician Assistant

## 2017-05-04 ENCOUNTER — Other Ambulatory Visit: Payer: Self-pay

## 2017-05-04 VITALS — BP 138/80 | HR 82 | Temp 98.4°F | Ht 59.0 in | Wt 216.8 lb

## 2017-05-04 DIAGNOSIS — K649 Unspecified hemorrhoids: Secondary | ICD-10-CM

## 2017-05-04 DIAGNOSIS — R21 Rash and other nonspecific skin eruption: Secondary | ICD-10-CM

## 2017-05-04 MED ORDER — TERBINAFINE HCL 250 MG PO TABS
250.0000 mg | ORAL_TABLET | Freq: Every day | ORAL | 0 refills | Status: DC
Start: 2017-05-04 — End: 2017-12-14

## 2017-05-04 MED ORDER — HYDROCORTISONE ACETATE 25 MG RE SUPP: 25.0000 mg | Freq: Two times a day (BID) | RECTAL | 0 refills | Status: DC

## 2017-05-04 NOTE — Patient Instructions (Addendum)
Please make sure you increase your fiber intake I would like you to take miralax as well, once per day.  Get this over the counter. Take medication as prescribed.  Please await contact for general surgeon.   Please soak 4 times per day for 15 minutes in warm water Hemorrhoids Hemorrhoids are swollen veins in and around the rectum or anus. Hemorrhoids can cause pain, itching, or bleeding. Most of the time, they do not cause serious problems. They usually get better with diet changes, lifestyle changes, and other home treatments. Follow these instructions at home: Eating and drinking  Eat foods that have fiber, such as whole grains, beans, nuts, fruits, and vegetables. Ask your doctor about taking products that have added fiber (fibersupplements).  Drink enough fluid to keep your pee (urine) clear or pale yellow. For Pain and Swelling  Take a warm-water bath (sitz bath) for 20 minutes to ease pain. Do this 3-4 times a day.  If directed, put ice on the painful area. It may be helpful to use ice between your warm baths. ? Put ice in a plastic bag. ? Place a towel between your skin and the bag. ? Leave the ice on for 20 minutes, 2-3 times a day. General instructions  Take over-the-counter and prescription medicines only as told by your doctor. ? Medicated creams and medicines that are inserted into the anus (suppositories) may be used or applied as told.  Exercise often.  Go to the bathroom when you have the urge to poop (to have a bowel movement). Do not wait.  Avoid pushing too hard (straining) when you poop.  Keep the butt area dry and clean. Use wet toilet paper or moist paper towels.  Do not sit on the toilet for a long time. Contact a doctor if:  You have any of these: ? Pain and swelling that do not get better with treatment or medicine. ? Bleeding that will not stop. ? Trouble pooping or you cannot poop. ? Pain or swelling outside the area of the hemorrhoids. This  information is not intended to replace advice given to you by your health care provider. Make sure you discuss any questions you have with your health care provider. Document Released: 01/20/2008 Document Revised: 09/18/2015 Document Reviewed: 12/25/2014 Elsevier Interactive Patient Education  2018 [employer].    IF you received an x-ray today, you will receive an invoice from [facility]. Please contact [facility] at [phone] with questions or concerns regarding your invoice.   IF you received labwork today, you will receive an invoice from [facility]. Please contact [facility] at [phone] with questions or concerns regarding your invoice.   Our billing staff will not be able to assist you with questions regarding bills from these companies.  You will be contacted with the lab results as soon as they are available. The fastest way to get your results is to activate your My Chart account. Instructions are located on the last page of this paperwork. If you have not heard from [place] regarding the results in 2 weeks, please contact this office.        IF you received an x-ray today, you will receive an invoice from [facility]. Please contact [facility]iology at [phone] with questions or concerns regarding your invoice.   IF you received labwork today, you will receive an invoice from [facility] [place]. Please contact [facility] at [phone] with questions or concerns regarding your invoice.   Our billing staff will not be able to assist you  with questions regarding bills from these companies.  You will be contacted with the lab results as soon as they are available. The fastest way to get your results is to activate your My Chart account. Instructions are located on the last page of this paperwork. If you have not heard from [place] regarding the results in 2 weeks, please contact this office.

## 2017-05-04 NOTE — Progress Notes (Signed)
PRIMARY CARE AT [place], [place] [address] [phone]  Date:  05/04/2017   Name:  Kathy [name]   DOB:  07-08-1955   MRN:  [id number]  PCP:  Patient, No Pcp Per    History of Present Illness:  Kathy [name] is a 62 y.o. female patient who presents to PCP with  Chief Complaint  Patient presents with  . Mass    INSIDE OF BUTTOCKS FOR THE PAST 3 MOS. HAS BLOOD DURING BOWEL MOVEMENT. PAIN DURING BOWEL MOVEMENT OR TOUCH  . Establish Care    LOOKING TO EST CARE. HAS NOT BEEN SEEN IN A WHILE FOR CHRONIC     Patient has had 1 month of an appreciable mass at her buttock.  Painful bowel movements feels like something is cutting.  She has no abdominal pain associtated.  She does suffer from chronic constipation.   Last colonoscopy was 2017, she reports.     Patient Active Problem List   Diagnosis Date Noted  . TRANSAMINASES, SERUM, ELEVATED 05/15/2010  . OBESITY, UNSPECIFIED 07/17/2009  . DYSPNEA 07/17/2009  . DIASTOLIC DYSFUNCTION 44/31/5400  . UTI 06/10/2009  . CHEST PAIN UNSPECIFIED 06/03/2009  . FREQUENCY, URINARY 06/03/2009  . COMMON MIGRAINE 07/17/2008  . DM 06/01/2006  . Pain in limb 06/01/2006  . HYPERLIPIDEMIA 05/30/2006  . HYPERTENSION 05/30/2006  . ASTHMA 05/30/2006    Past Medical History:  Diagnosis Date  . Asthma   . Chronic kidney disease    Kidney insuff Cr 1.4 in 1/07  . Diabetes mellitus    hbg A1c 6.2 in 1/07  . Diastolic dysfunction   . Hyperlipidemia    stopped meds due to side effects  . Hypertension     Past Surgical History:  Procedure Laterality Date  . CESAREAN SECTION      Social History   Tobacco Use  . Smoking status: Never Smoker  . Smokeless tobacco: Never Used  Substance Use Topics  . Alcohol use: No    Frequency: Never  . Drug use: No    Family History  Problem Relation Age of Onset  . Hypertension Mother 70  . Stroke Mother 68  . Diabetes Maternal Aunt        s/p bilater amputation due to  DM    Allergies  Allergen Reactions  . Lisinopril     REACTION: itching  . Pravastatin Sodium     REACTION: leg cramps    Medication list has been reviewed and updated.  Current Outpatient Medications on File Prior to Visit  Medication Sig Dispense Refill  . albuterol (VENTOLIN HFA) 108 (90 BASE) MCG/ACT inhaler Inhale 2 puffs into the lungs every 4 (four) hours as needed.      Marland Kitchen aspirin 81 MG tablet Take 81 mg by mouth daily.      Marland Kitchen atorvastatin (LIPITOR) 20 MG tablet Take 20 mg by mouth at bedtime.      . B Complex-C (B-COMPLEX WITH VITAMIN C) tablet Take 1 tablet by mouth daily.      . beclomethasone (QVAR) 40 MCG/ACT inhaler Inhale 1 puff into the lungs 2 (two) times daily. Rinse mouth after use     . Colesevelam HCl (WELCHOL) 3.75 G PACK Take 37.5 g by mouth daily.      . fish oil-omega-3 fatty acids 1000 MG capsule Take 1 g by mouth daily.      Marland Kitchen glucose blood (FREESTYLE LITE) test strip 1 each by Other route as needed. Use as instructed     .  hydrochlorothiazide (,MICROZIDE/HYDRODIURIL,) 12.5 MG capsule Take 12.5 mg by mouth daily.      Marland Kitchen loratadine (ALAVERT) 10 MG tablet Take 10 mg by mouth daily.      Marland Kitchen losartan (COZAAR) 100 MG tablet Take 100 mg by mouth daily.      . metoprolol (TOPROL-XL) 50 MG 24 hr tablet Take 50 mg by mouth daily.      . Multiple Vitamins-Minerals (CENTRUM PO) Take 1 tablet by mouth daily.      . Saxagliptin-Metformin (KOMBIGLYZE XR) 08-998 MG TB24 Take 1 tablet by mouth daily.      . vitamin B-12 (CYANOCOBALAMIN) 100 MCG tablet Take 50 mcg by mouth daily.       No current facility-administered medications on file prior to visit.     ROS ROS otherwise unremarkable unless listed above.  Physical Examination: There were no vitals taken for this visit. Ideal Body Weight:    Physical Exam  Constitutional: She is oriented to person, place, and time. She appears well-developed and well-nourished. No distress.  HENT:  Head: Normocephalic and  atraumatic.  Right Ear: External ear normal.  Left Ear: External ear normal.  Eyes: Conjunctivae and EOM are normal. Pupils are equal, round, and reactive to light.  Cardiovascular: Normal rate.  Pulmonary/Chest: Effort normal. No respiratory distress.  Genitourinary: Rectal exam shows external hemorrhoid (thrombosed appearance). Pelvic exam was performed with patient in the knee-chest position.  Genitourinary Comments: Left buttock adjacent to the anus, is hyperpigmented and thickened.  No tenderness to this area.   Sanguinous materials when hemorrhoid was incised.   Neurological: She is alert and oriented to person, place, and time.  Skin: She is not diaphoretic.  Psychiatric: She has a normal mood and affect. Her behavior is normal.     Assessment and Plan: Kathy [name] is a 62 y.o. female who is here today for cc of  Chief Complaint  Patient presents with  . Mass    INSIDE OF BUTTOCKS FOR THE PAST 3 MOS. HAS BLOOD DURING BOWEL MOVEMENT. PAIN DURING BOWEL MOVEMENT OR TOUCH  . Establish Care    LOOKING TO EST CARE. HAS NOT BEEN SEEN IN A WHILE FOR CHRONIC  --hemorrhoid opened and may continue to express however, there is concern of rash near the anus.  I am referring her to general surgery at this time for follow up Given anusol and terbinafine to cover possible fungal etiology of the rash.  Hemorrhoids, unspecified hemorrhoid type - Plan: hydrocortisone (ANUSOL-HC) 25 MG suppository, WOUND CULTURE, Ambulatory referral to General Surgery  Rash - Plan: terbinafine (LAMISIL) 250 MG tablet, Ambulatory referral to General Surgery  Ivar Drape, PA-C [facility] 1/9/20196:15 PM

## 2017-05-06 LAB — WOUND CULTURE

## 2017-07-27 ENCOUNTER — Encounter: Payer: Self-pay | Admitting: Physician Assistant

## 2017-09-08 ENCOUNTER — Ambulatory Visit (HOSPITAL_COMMUNITY)
Admission: EM | Admit: 2017-09-08 | Discharge: 2017-09-08 | Disposition: A | Discharge status: Home / Self Care | Payer: Self-pay | Attending: Family Medicine | Admitting: Family Medicine

## 2017-09-08 ENCOUNTER — Encounter (HOSPITAL_COMMUNITY): Payer: Self-pay | Admitting: Family Medicine

## 2017-09-08 DIAGNOSIS — K6289 Other specified diseases of anus and rectum: Secondary | ICD-10-CM

## 2017-09-08 NOTE — ED Provider Notes (Signed)
[place]   [id number] 09/08/17 Arrival Time: 4098  SUBJECTIVE:  Kathy [name] is a 62 y.o. female who presents with complaint of worsening rectal pain for the past 5 months that intitially began 9 months ago.  States she was straining more frequently with BMs.  Describes her pain as intermittent and tearing sensation in character.  Has tried vaseline, and preparation H without relief.  Worse with having a BM.  Denies similar symptoms in the past.  Last BM this AM.    Complains of decreased appetite, blood in the commode, and mucus.    Denies fever, chills, weight changes, nausea, vomiting, chest pain, SOB, diarrhea, constipation, hematochezia, melena, pencil-thin stools, dysuria, difficulty urinating, increased frequency or urgency, flank pain, loss of bowel or bladder function.  Last colonoscopy: fall 2017 normal without polyps or diverticulosis  No LMP recorded.  ROS: As per HPI.  Past Medical History:  Diagnosis Date  . Asthma   . Chronic kidney disease    Kidney insuff Cr 1.4 in 1/07  . Diabetes mellitus    hbg A1c 6.2 in 1/07  . Diastolic dysfunction   . Hyperlipidemia    stopped meds due to side effects  . Hypertension    Past Surgical History:  Procedure Laterality Date  . CESAREAN SECTION     Allergies  Allergen Reactions  . Lisinopril     REACTION: itching  . Pravastatin Sodium     REACTION: leg cramps   No current facility-administered medications on file prior to encounter.    Current Outpatient Medications on File Prior to Encounter  Medication Sig Dispense Refill  . albuterol (VENTOLIN HFA) 108 (90 BASE) MCG/ACT inhaler Inhale 2 puffs into the lungs every 4 (four) hours as needed.      Marland Kitchen amlodipine-atorvastatin (CADUET) 10-10 MG tablet Take 1 tablet by mouth daily.    Marland Kitchen aspirin 81 MG tablet Take 81 mg by mouth daily.      Marland Kitchen atorvastatin (LIPITOR) 20 MG tablet Take 20 mg by mouth at bedtime.      . B Complex-C (B-COMPLEX WITH VITAMIN  C) tablet Take 1 tablet by mouth daily.      . beclomethasone (QVAR) 40 MCG/ACT inhaler Inhale 1 puff into the lungs 2 (two) times daily. Rinse mouth after use     . Colesevelam HCl (WELCHOL) 3.75 G PACK Take 37.5 g by mouth daily.      . fish oil-omega-3 fatty acids 1000 MG capsule Take 1 g by mouth daily.      Marland Kitchen glucose blood (FREESTYLE LITE) test strip 1 each by Other route as needed. Use as instructed     . hydrochlorothiazide (,MICROZIDE/HYDRODIURIL,) 12.5 MG capsule Take 12.5 mg by mouth daily.      . hydrocortisone (ANUSOL-HC) 25 MG suppository Place 1 suppository (25 mg total) rectally 2 (two) times daily. 12 suppository 0  . loratadine (ALAVERT) 10 MG tablet Take 10 mg by mouth daily.      Marland Kitchen losartan (COZAAR) 100 MG tablet Take 100 mg by mouth daily.      . metoprolol (TOPROL-XL) 50 MG 24 hr tablet Take 50 mg by mouth daily.      . Multiple Vitamins-Minerals (CENTRUM PO) Take 1 tablet by mouth daily.      . Saxagliptin-Metformin (KOMBIGLYZE XR) 08-998 MG TB24 Take 1 tablet by mouth daily.      Marland Kitchen terbinafine (LAMISIL) 250 MG tablet Take 1 tablet (250 mg total) by mouth daily. 14 tablet 0  .  vitamin B-12 (CYANOCOBALAMIN) 100 MCG tablet Take 50 mcg by mouth daily.       Social History   Socioeconomic History  . Marital status: Married    Spouse name: Not on file  . Number of children: Not on file  . Years of education: Not on file  . Highest education level: Not on file  Occupational History  . Not on file  Social Needs  . Financial resource strain: Not on file  . Food insecurity:    Worry: Not on file    Inability: Not on file  . Transportation needs:    Medical: Not on file    Non-medical: Not on file  Tobacco Use  . Smoking status: Never Smoker  . Smokeless tobacco: Never Used  Substance and Sexual Activity  . Alcohol use: No    Frequency: Never  . Drug use: No  . Sexual activity: Not on file  Lifestyle  . Physical activity:    Days per week: Not on file     Minutes per session: Not on file  . Stress: Not on file  Relationships  . Social connections:    Talks on phone: Not on file    Gets together: Not on file    Attends religious service: Not on file    Active member of club or organization: Not on file    Attends meetings of clubs or organizations: Not on file    Relationship status: Not on file  . Intimate partner violence:    Fear of current or ex partner: Not on file    Emotionally abused: Not on file    Physically abused: Not on file    Forced sexual activity: Not on file  Other Topics Concern  . Not on file  Social History Narrative  . Not on file   Family History  Problem Relation Age of Onset  . Hypertension Mother 64  . Stroke Mother 78  . Diabetes Maternal Aunt        s/p bilater amputation due to DM     OBJECTIVE:  Vitals:   09/08/17 1147  BP: (!) 162/72  Pulse: 83  Resp: 18  Temp: 98.6 F (37 C)  SpO2: 100%    General appearance: AOx3 in no acute distress HEENT: NCAT.  Oropharynx clear.  Lungs: clear to auscultation bilaterally without adventitious breath sounds Heart: regular rate and rhythm.  Radial pulses 2+ symmetrical bilaterally Abdomen: soft, non-distended; normal active bowel sounds; non-tender to light and deep palpation; no guarding Rectal: External: Approximately 4 external masses varying in size from 0.5 - 1 cm around the anal opening.  Internal: Limited due to patients discomfort, but hard mass(es) felt in 6-8 o'clock position.  Hemoccult positive.   Back: no CVA tenderness Extremities: no edema; symmetrical with no gross deformities Skin: warm and dry Neurologic: normal gait Psychological: alert and cooperative; normal mood and affect  Labs:  Hemoccult positive  Imaging: No results found.   ASSESSMENT & PLAN:  1. Mass of anus     No orders of the defined types were placed in this encounter.   Recommend further evaluation with colorectal surgeon Try sitz baths as needed in the  meantime for symptomatic relief Return or go to the ER if you have any new or worsening symptoms  Reviewed expectations re: course of current medical issues. Questions answered. Outlined signs and symptoms indicating need for more acute intervention. Patient verbalized understanding. After Visit Summary given.   Lestine Box, PA-C 09/08/17  1321  

## 2017-09-08 NOTE — ED Triage Notes (Addendum)
Pt here for possible hemorrhoids. sts that this has been ongoing for 9 months. But recently became,e much worse. She is having blood with BM and clots at times. Reports severe pain with BM. She also hasnt taken any of her chronic medication to include diabetes and HTN since September due to unemployment.

## 2017-09-08 NOTE — Discharge Instructions (Addendum)
Recommend further evaluation with colorectal surgeon Try sitz baths as needed in the meantime for symptomatic relief Return or go to the ER if you have any new or worsening symptoms

## 2017-09-09 LAB — OCCULT BLOOD, POC DEVICE: FECAL OCCULT BLD: POSITIVE — AB

## 2017-09-20 ENCOUNTER — Ambulatory Visit: Payer: Self-pay | Admitting: General Surgery

## 2017-09-20 NOTE — H&P (Signed)
CC: Anal pain x7yr HPI: Kathy [name] a pleasant 62yoFwith hx of HTN, DM, who presents to our clinic today in follow-up from an emergency department visit from 5/16/194 anal pain. She describes a one-year history of persistent anal pain with bowel movements. She describes the pain as being sharp and knifelike. For the last couple of months she has also noted increase in the frequency of fecal incontinence which she says she never had issues with prior. She describes mucoid drainage/discharge from the anal area as well as some stool which occurs daily. She does not wear a pad or diaper. She does not alter her activities based on this. She notes some blood with her bowel movements intermittently. She is not taking a daily fiber supplement. She does report drinking 64 ounces of water per day. She has 2-3 semisolid stools per day. Denies history of constipation.  [facility] [place] She states she was told she had no polyps or other concerning findings. She was told she needed a repeat colonoscopy in 10 years  PMH: HTN (currently controlled on medication); diabetes mellitus (controlled on oral hypoglycemics); HLD (well controlled on statin); denies taking blood thinners aside from 836masa daily.  PSH: C-section via Pfannenstiel; denies any other prior abdominal or anorectal procedures  FHx: Denies FHx of malignancy  Social: Denies use of tobacco/EtOH/drugs  ROS: A comprehensive 10 system review of systems was completed with the patient and pertinent findings as noted above.  The patient is a 62ear old female.   Past Surgical History (ASabino Gasser5/28/2019 11:16 AM) Cesarean Section - 1   Diagnostic Studies History (ASabino Gasser5/28/2019 11:16 AM) Colonoscopy  1-5 years ago Mammogram  1-3 years ago Pap Smear  1-5 years ago  Allergies (ASabino Gasser5/28/2019 11:17 AM) No  Known Drug Allergies [09/20/2017]: Allergies Reconciled   Medication History (ASabino Gasser5/28/2019 11:17 AM) No Current Medications Medications Reconciled  Social History (ASabino Gasser5/28/2019 11:16 AM) Alcohol use  Occasional alcohol use. Caffeine use  Tea. No drug use  Tobacco use  Never smoker.  Family History (ASabino Gasser5/28/2019 11:16 AM) Arthritis  Mother, Sister. Cerebrovascular Accident  Mother. Respiratory Condition  Father.  Pregnancy / Birth History (ASabino Gasser5/28/2019 11:16 AM) Age at menarche  [age]ears. Age of menopause  <43 Gravida  2 Irregular periods  Maternal age  64-20ara  2  Other Problems (ASabino Gasser5/28/2019 11:16 AM) Asthma  Diabetes Mellitus  Gastroesophageal Reflux Disease  High blood pressure  Migraine Headache     Review of Systems (ASabino Gasser5/28/2019 11:16 AM) General Not Present- Appetite Loss, Chills, Fatigue, Fever, Night Sweats, Weight Gain and Weight Loss. Skin Not Present- Change in Wart/Mole, Dryness, Hives, Jaundice, New Lesions, Non-Healing Wounds, Rash and Ulcer. HEENT Present- Seasonal Allergies and Wears glasses/contact lenses. Not Present- Earache, Hearing Loss, Hoarseness, Nose Bleed, Oral Ulcers, Ringing in the Ears, Sinus Pain, Sore Throat, Visual Disturbances and Yellow Eyes. Respiratory Present- Chronic Cough. Not Present- Bloody sputum, Difficulty Breathing, Snoring and Wheezing. Breast Not Present- Breast Mass, Breast Pain, Nipple Discharge and Skin Changes. Gastrointestinal Present- Hemorrhoids and Rectal Pain. Not Present- Abdominal Pain, Bloating, Bloody Stool, Change in Bowel Habits, Chronic diarrhea, Constipation, Difficulty Swallowing, Excessive gas, Gets full quickly at meals, Indigestion, Nausea and Vomiting. Female Genitourinary Not Present- Frequency, Nocturia, Painful Urination, Pelvic Pain and Urgency. Musculoskeletal Not Present- Back Pain, Joint Pain, Joint  Stiffness, Muscle Pain, Muscle Weakness and Swelling of Extremities. Neurological Not Present- Decreased Memory, Fainting, Headaches, Numbness, Seizures, Tingling, Tremor, Trouble walking and Weakness. Psychiatric Not Present- Anxiety, Bipolar, Change in Sleep Pattern, Depression, Fearful and Frequent crying. Endocrine Not Present- Cold Intolerance, Excessive Hunger, Hair Changes, Heat Intolerance, Hot flashes and New Diabetes. Hematology Not Present- Blood Thinners, Easy Bruising, Excessive bleeding, Gland problems, HIV and Persistent Infections.  Vitals Sabino Gasser; 09/20/2017 11:18 AM) 09/20/2017 11:17 AM Weight: 211.5 lb Height: 59in Body Surface Area: 1.89 m Body Mass Index: 42.72 kg/m  Temp.: 98.75F(Oral)  Pulse: 108 (Regular)  BP: 136/82 (Sitting, Left Arm, Standard)       Physical Exam [place]rell Gave M. Amire Leazer MD; 09/20/2017 11:46 AM) The physical exam findings are as follows: Note:Constitutional: No acute distress; conversant; no deformities Eyes: Moist conjunctiva; no lid lag; anicteric sclerae; pupils equal round and reactive to light Neck: Trachea midline; no palpable thyromegaly Lungs: Normal respiratory effort; no tactile fremitus CV: Regular rate and rhythm; no palpable thrill; no pitting edema GI: Abdomen soft, nontender, nondistended; no palpable hepatosplenomegaly Anorectal: 3 column external hemorrhoids-possible recent thromboses, firm nodular-like mass in the left lateral position, no surrounding erythema, no palpable fluctuance, unable to adequately visualize anal canal to inspect for fissure due to intolerance. Anoscopy and DRE was not attempted due to intolerance MSK: Normal gait; no clubbing/cyanosis Psychiatric: Appropriate affect; alert and oriented 3 Lymphatic: No palpable cervical or axillary lymphadenopathy **A chaperone, Lennart Pall, was present for the entire physical exam    Assessment & Plan Harrell Gave M. Leani Myron MD; 09/20/2017 11:49  AM) MASS OF ANUS (K62.89) Impression: Kathy [name] is a very pleasant 62yoF with hx of HTN, DM here today with external hemorrhoids, probable recent thrombosis, and additionally a firm/nodular-like mass in the left lateral position -We discussed the anatomy and physiology of the GI tract and pathophysiology of hemorrhoids as well as anal masses with associated illustrations. We discussed the possibility this could represent an underlying anal cancer. Given her poor tolerance in the office for exam as well as need for tissue biopsy, I recommended she undergo anal exam under anesthesia with possible biopsy. I am leaving town later this week for a conference and have asked my partner, Dr. Marcello Moores, to assist in her care for purposes of expediting her exam and biopsy -The planned procedure, material risks (including, but not limited to, pain, bleeding, infection, scarring, need for blood transfusion, damage to anal sphincter, incontinence of gas and/or stool, need for additional procedures, recurrence, pneumonia, heart attack, stroke, death) benefits and alternatives to surgery were discussed at length. The patient's questions were answered to her satisfaction, she voiced understanding and elected to proceed with surgery. Additionally, we discussed typical postoperative expectations and the recovery process. -I recommended she begin taking a daily fiber supplement (Metamucil/Citrucel/FiberCon/Benefiber/Konsyl), continue drinking 64 ounces of water per day (8, 8 ounce glasses) and minimize time on commode 2-3 minutes.  Signed electronically by Ileana Roup, MD (09/20/2017 11:50 AM)

## 2017-09-23 ENCOUNTER — Encounter (HOSPITAL_BASED_OUTPATIENT_CLINIC_OR_DEPARTMENT_OTHER): Payer: Self-pay | Admitting: *Deleted

## 2017-09-23 ENCOUNTER — Other Ambulatory Visit: Payer: Self-pay

## 2017-09-23 NOTE — Progress Notes (Addendum)
Spoke w/ pt via phone for pre-op interview.  Npo after mn.  Arrive at 0530.  Needs istat and ekg.  Pt has not taken any of her medication for HTN, DM2, or Asthma since 05/ 2018 , per pt unable to pay for them since unemployed and no insurance.  Will review w/ anesthesia. [facility]  And spoke w/ triage nurse, April, asked April to let dr Marcello Moores aware about pt not taking and has not taken any of medication since May 2018 due to unemployment and no insurance.  H&P dated 09-20-2017 done by dr white stated that HTN and DM2 controlled with medication, which pt is not taking.  April stated she will let dr Marcello Moores know. Will await any further instruction/orders.  ADDENDUM:  REVIEWED CHART W/ DR CARIGNAN MDA, FACE TO FACE, STATED OK TO PROCEED.

## 2017-09-30 NOTE — Progress Notes (Signed)
Pt contacted in regards to surgical instructions; pt verbalized understanding of arrival time for Monday 10/03/2017, no food or drink after midnight and which medications she is allowed to take am of surgery with sip of water. Pt aware to bring albuterol inhaler with her day of surgery.

## 2017-09-30 NOTE — Progress Notes (Signed)
Pt needs surgical orders placed in epic for surgery scheduled on Monday 10/03/2017. Thanks.

## 2017-10-03 ENCOUNTER — Ambulatory Visit (HOSPITAL_COMMUNITY)
Admission: RE | Admit: 2017-10-03 | Discharge: 2017-10-03 | Disposition: A | Discharge status: Home / Self Care | Payer: Self-pay | Source: Ambulatory Visit | Attending: Surgery | Admitting: Surgery

## 2017-10-03 ENCOUNTER — Ambulatory Visit (HOSPITAL_COMMUNITY): Payer: Self-pay | Admitting: Anesthesiology

## 2017-10-03 ENCOUNTER — Encounter (HOSPITAL_COMMUNITY): Payer: Self-pay | Admitting: Anesthesiology

## 2017-10-03 ENCOUNTER — Other Ambulatory Visit: Payer: Self-pay

## 2017-10-03 ENCOUNTER — Encounter (HOSPITAL_COMMUNITY)
Admission: RE | Disposition: A | Discharge status: Home / Self Care | Payer: Self-pay | Source: Ambulatory Visit | Attending: Surgery

## 2017-10-03 DIAGNOSIS — K6289 Other specified diseases of anus and rectum: Secondary | ICD-10-CM | POA: Insufficient documentation

## 2017-10-03 DIAGNOSIS — I10 Essential (primary) hypertension: Secondary | ICD-10-CM | POA: Insufficient documentation

## 2017-10-03 DIAGNOSIS — E785 Hyperlipidemia, unspecified: Secondary | ICD-10-CM | POA: Insufficient documentation

## 2017-10-03 DIAGNOSIS — K644 Residual hemorrhoidal skin tags: Secondary | ICD-10-CM | POA: Insufficient documentation

## 2017-10-03 DIAGNOSIS — Z7984 Long term (current) use of oral hypoglycemic drugs: Secondary | ICD-10-CM | POA: Insufficient documentation

## 2017-10-03 DIAGNOSIS — Z79899 Other long term (current) drug therapy: Secondary | ICD-10-CM | POA: Insufficient documentation

## 2017-10-03 DIAGNOSIS — E119 Type 2 diabetes mellitus without complications: Secondary | ICD-10-CM | POA: Insufficient documentation

## 2017-10-03 HISTORY — PX: RECTAL BIOPSY: SHX2303

## 2017-10-03 HISTORY — DX: Type 2 diabetes mellitus without complications: E11.9

## 2017-10-03 LAB — CBC
HCT: 37.9 % (ref 36.0–46.0)
HEMOGLOBIN: 11.9 g/dL — AB (ref 12.0–15.0)
MCH: 22.6 pg — ABNORMAL LOW (ref 26.0–34.0)
MCHC: 31.4 g/dL (ref 30.0–36.0)
MCV: 71.9 fL — AB (ref 78.0–100.0)
PLATELETS: 332 10*3/uL (ref 150–400)
RBC: 5.27 MIL/uL — ABNORMAL HIGH (ref 3.87–5.11)
RDW: 16 % — ABNORMAL HIGH (ref 11.5–15.5)
WBC: 5.9 10*3/uL (ref 4.0–10.5)

## 2017-10-03 LAB — BASIC METABOLIC PANEL
Anion gap: 8 (ref 5–15)
BUN: 16 mg/dL (ref 6–20)
CHLORIDE: 103 mmol/L (ref 101–111)
CO2: 29 mmol/L (ref 22–32)
CREATININE: 1.16 mg/dL — AB (ref 0.44–1.00)
Calcium: 9.2 mg/dL (ref 8.9–10.3)
GFR calc Af Amer: 56 mL/min — ABNORMAL LOW (ref >60)
GFR calc non Af Amer: 49 mL/min — ABNORMAL LOW (ref >60)
GLUCOSE: 170 mg/dL — AB (ref 65–99)
Potassium: 4.2 mmol/L (ref 3.5–5.1)
SODIUM: 140 mmol/L (ref 135–145)

## 2017-10-03 LAB — HEMOGLOBIN A1C
HEMOGLOBIN A1C: 6.8 % — AB (ref 4.8–5.6)
MEAN PLASMA GLUCOSE: 148.46 mg/dL

## 2017-10-03 LAB — GLUCOSE, CAPILLARY
GLUCOSE-CAPILLARY: 178 mg/dL — AB (ref 65–99)
Glucose-Capillary: 174 mg/dL — ABNORMAL HIGH (ref 65–99)

## 2017-10-03 SURGERY — EXAM UNDER ANESTHESIA
Anesthesia: General | Site: Anus

## 2017-10-03 MED ORDER — ONDANSETRON HCL 4 MG/2ML IJ SOLN
INTRAMUSCULAR | Status: AC
  Filled 2017-10-03: qty 2

## 2017-10-03 MED ORDER — BUPIVACAINE-EPINEPHRINE (PF) 0.25% -1:200000 IJ SOLN
INTRAMUSCULAR | Status: AC
  Filled 2017-10-03: qty 30

## 2017-10-03 MED ORDER — LACTATED RINGERS IV SOLN
INTRAVENOUS | Status: DC | PRN
  Administered 2017-10-03: 06:00:00 via INTRAVENOUS

## 2017-10-03 MED ORDER — BUPIVACAINE-EPINEPHRINE 0.25% -1:200000 IJ SOLN
INTRAMUSCULAR | Status: DC | PRN
  Administered 2017-10-03: 30 mL

## 2017-10-03 MED ORDER — MIDAZOLAM HCL 2 MG/2ML IJ SOLN
INTRAMUSCULAR | Status: AC
  Filled 2017-10-03: qty 2

## 2017-10-03 MED ORDER — FENTANYL CITRATE (PF) 100 MCG/2ML IJ SOLN
INTRAMUSCULAR | Status: DC | PRN
  Administered 2017-10-03: 50 ug via INTRAVENOUS

## 2017-10-03 MED ORDER — ACETAMINOPHEN 500 MG PO TABS
1000.0000 mg | ORAL_TABLET | ORAL | Status: AC
  Administered 2017-10-03: 1000 mg via ORAL
  Filled 2017-10-03: qty 2

## 2017-10-03 MED ORDER — DEXAMETHASONE SODIUM PHOSPHATE 10 MG/ML IJ SOLN
INTRAMUSCULAR | Status: AC
  Filled 2017-10-03: qty 1

## 2017-10-03 MED ORDER — OXYCODONE HCL 5 MG PO TABS: 10.0000 mg | ORAL_TABLET | Freq: Four times a day (QID) | ORAL | 0 refills | Status: AC | PRN

## 2017-10-03 MED ORDER — BUPIVACAINE LIPOSOME 1.3 % IJ SUSP
20.0000 mL | Freq: Once | INTRAMUSCULAR | Status: AC
  Administered 2017-10-03: 20 mL
  Filled 2017-10-03: qty 20

## 2017-10-03 MED ORDER — DIBUCAINE 1 % RE OINT
TOPICAL_OINTMENT | RECTAL | Status: AC
  Filled 2017-10-03: qty 28

## 2017-10-03 MED ORDER — PROPOFOL 10 MG/ML IV BOLUS
INTRAVENOUS | Status: AC
  Filled 2017-10-03: qty 40

## 2017-10-03 MED ORDER — LACTATED RINGERS IV SOLN
INTRAVENOUS | Status: DC
Start: 2017-10-03 — End: 2017-10-03
  Administered 2017-10-03: 07:00:00 via INTRAVENOUS

## 2017-10-03 MED ORDER — FENTANYL CITRATE (PF) 100 MCG/2ML IJ SOLN: 25.0000 ug | INTRAMUSCULAR | Status: DC | PRN

## 2017-10-03 MED ORDER — MIDAZOLAM HCL 5 MG/5ML IJ SOLN
INTRAMUSCULAR | Status: DC | PRN
  Administered 2017-10-03: 2 mg via INTRAVENOUS

## 2017-10-03 MED ORDER — ONDANSETRON HCL 4 MG/2ML IJ SOLN
INTRAMUSCULAR | Status: DC | PRN
  Administered 2017-10-03: 4 mg via INTRAVENOUS

## 2017-10-03 MED ORDER — ESMOLOL HCL 100 MG/10ML IV SOLN
INTRAVENOUS | Status: DC | PRN
  Administered 2017-10-03 (×5): 10 mg via INTRAVENOUS

## 2017-10-03 MED ORDER — PROPOFOL 10 MG/ML IV BOLUS
INTRAVENOUS | Status: DC | PRN
  Administered 2017-10-03: 170 mg via INTRAVENOUS

## 2017-10-03 MED ORDER — SUGAMMADEX SODIUM 200 MG/2ML IV SOLN
INTRAVENOUS | Status: DC | PRN
  Administered 2017-10-03: 400 mg via INTRAVENOUS

## 2017-10-03 MED ORDER — FENTANYL CITRATE (PF) 100 MCG/2ML IJ SOLN
INTRAMUSCULAR | Status: AC
  Filled 2017-10-03: qty 2

## 2017-10-03 MED ORDER — DEXAMETHASONE SODIUM PHOSPHATE 10 MG/ML IJ SOLN
INTRAMUSCULAR | Status: DC | PRN
  Administered 2017-10-03: 8 mg via INTRAVENOUS

## 2017-10-03 MED ORDER — LIDOCAINE 2% (20 MG/ML) 5 ML SYRINGE
INTRAMUSCULAR | Status: DC | PRN
  Administered 2017-10-03: 80 mg via INTRAVENOUS

## 2017-10-03 MED ORDER — ROCURONIUM BROMIDE 10 MG/ML (PF) SYRINGE
PREFILLED_SYRINGE | INTRAVENOUS | Status: DC | PRN
  Administered 2017-10-03: 50 mg via INTRAVENOUS

## 2017-10-03 MED ORDER — SUGAMMADEX SODIUM 200 MG/2ML IV SOLN
INTRAVENOUS | Status: AC
  Filled 2017-10-03: qty 2

## 2017-10-03 SURGICAL SUPPLY — 34 items
BLADE HEX COATED 2.75 (ELECTRODE) ×4 IMPLANT
BLADE SURG 15 STRL LF DISP TIS (BLADE) ×2 IMPLANT
BLADE SURG 15 STRL SS (BLADE) ×4
COVER BACK TABLE 60X90IN (DRAPES) ×4 IMPLANT
DECANTER SPIKE VIAL GLASS SM (MISCELLANEOUS) ×4 IMPLANT
DRAIN PENROSE 18X1/2 LTX STRL (DRAIN) ×4 IMPLANT
DRSG PAD ABDOMINAL 8X10 ST (GAUZE/BANDAGES/DRESSINGS) ×4 IMPLANT
ELECT PENCIL ROCKER SW 15FT (MISCELLANEOUS) ×4 IMPLANT
ELECT REM PT RETURN 15FT ADLT (MISCELLANEOUS) ×4 IMPLANT
GAUZE 4X4 16PLY RFD (DISPOSABLE) ×4 IMPLANT
GAUZE SPONGE 4X4 12PLY STRL (GAUZE/BANDAGES/DRESSINGS) ×4 IMPLANT
GLOVE BIO SURGEON STRL SZ7.5 (GLOVE) ×4 IMPLANT
GLOVE BIOGEL PI IND STRL 7.0 (GLOVE) ×2 IMPLANT
GLOVE BIOGEL PI INDICATOR 7.0 (GLOVE) ×2
GLOVE INDICATOR 8.0 STRL GRN (GLOVE) ×8 IMPLANT
GOWN STRL REUS W/TWL LRG LVL3 (GOWN DISPOSABLE) ×4 IMPLANT
GOWN STRL REUS W/TWL XL LVL3 (GOWN DISPOSABLE) ×8 IMPLANT
KIT BASIN OR (CUSTOM PROCEDURE TRAY) ×4 IMPLANT
LUBRICANT JELLY K Y 4OZ (MISCELLANEOUS) ×4 IMPLANT
NDL HYPO 25X1 1.5 SAFETY (NEEDLE) ×1 IMPLANT
NDL SAFETY ECLIPSE 18X1.5 (NEEDLE) ×2 IMPLANT
NEEDLE HYPO 18GX1.5 SHARP (NEEDLE) ×4
NEEDLE HYPO 25X1 1.5 SAFETY (NEEDLE) ×4 IMPLANT
NS IRRIG 1000ML POUR BTL (IV SOLUTION) ×4 IMPLANT
PACK LITHOTOMY IV (CUSTOM PROCEDURE TRAY) ×4 IMPLANT
PAD ABD 8X10 STRL (GAUZE/BANDAGES/DRESSINGS) ×2 IMPLANT
SPONGE SURGIFOAM ABS GEL 12-7 (HEMOSTASIS) ×4 IMPLANT
SUT CHROMIC 2 0 SH (SUTURE) IMPLANT
SUT CHROMIC 3 0 SH 27 (SUTURE) IMPLANT
SYR CONTROL 10ML LL (SYRINGE) ×4 IMPLANT
TOWEL OR 17X26 10 PK STRL BLUE (TOWEL DISPOSABLE) ×4 IMPLANT
TOWEL OR NON WOVEN STRL DISP B (DISPOSABLE) ×4 IMPLANT
UNDERPAD 30X30 (UNDERPADS AND DIAPERS) ×4 IMPLANT
YANKAUER SUCT BULB TIP 10FT TU (MISCELLANEOUS) ×4 IMPLANT

## 2017-10-03 NOTE — H&P (Signed)
CC: Anal pain x32yr- here today for EUA/biopsies  HPI: Kathy [name] a pleasant 89yoFwith hx of HTN, DM, who presented to our clinic today in follow-up from an emergency department visit from 09/08/17 for anal pain. She described a one-year history of persistent anal pain with bowel movements. She described the pain as being sharp and knifelike. For the last couple of months she has also noted increase in the frequency of fecal incontinence which she says she never had issues with prior. She describes mucoid drainage/discharge from the anal area as well as some stool which occurs daily. She does not wear a pad or diaper. She does not alter her activities based on this. She notes some blood with her bowel movements intermittently. She is not taking a daily fiber supplement. She does report drinking 64 ounces of water per day. She has 2-3 semisolid stools per day. Denies history of constipation.  [place] She states she was told she had no polyps or other concerning findings. She was told she needed a repeat colonoscopy in 10 years  PMH: HTN (stated controlled on medication); diabetes mellitus (controlled on oral hypoglycemics); HLD (well controlled on statin); denies taking blood thinners aside from 859masa daily.  PSH: C-section via Pfannenstiel; denies any other prior abdominal or anorectal procedures  FHx: Denies FHx of malignancy  Social: Denies use of tobacco/EtOH/drugs  ROS: A comprehensive 10 system review of systems was completed with the patient and pertinent findings as noted above.  The patient is a 89ear old female.   Past Surgical History (ASabino Gasser5/28/2019 11:16 AM) Cesarean Section - 1   Diagnostic Studies History (ASabino Gasser5/28/2019 11:16 AM) Colonoscopy  1-5 years ago Mammogram  1-3 years ago Pap Smear  1-5 years ago  Allergies (ASabino Gasser5/28/2019 11:17 AM) No Known Drug Allergies [09/20/2017]: Allergies Reconciled   Medication History (ASabino Gasser5/28/2019 11:17 AM) No Current Medications Medications Reconciled  Social History (ASabino Gasser5/28/2019 11:16 AM) Alcohol use  Occasional alcohol use. Caffeine use  Tea. No drug use  Tobacco use  Never smoker.  Family History (ASabino Gasser5/28/2019 11:16 AM) Arthritis  Mother, Sister. Cerebrovascular Accident  Mother. Respiratory Condition  Father.  Pregnancy / Birth History (ASabino Gasser5/28/2019 11:16 AM) Age at menarche  [age]. Age of menopause  <43 Gravida  2 Irregular periods  Maternal age  64-20ara  2  Other Problems (ASabino Gasser5/28/2019 11:16 AM) Asthma  Diabetes Mellitus  Gastroesophageal Reflux Disease  High blood pressure  Migraine Headache     Review of Systems (ASabino Gasser5/28/2019 11:16 AM) General Not Present- Appetite Loss, Chills, Fatigue, Fever, Night Sweats, Weight Gain and Weight Loss. Skin Not Present- Change in Wart/Mole, Dryness, Hives, Jaundice, New Lesions, Non-Healing Wounds, Rash and Ulcer. HEENT Present- Seasonal Allergies and Wears glasses/contact lenses. Not Present- Earache, Hearing Loss, Hoarseness, Nose Bleed, Oral Ulcers, Ringing in the Ears, Sinus Pain, Sore Throat, Visual Disturbances and Yellow Eyes. Respiratory Present- Chronic Cough. Not Present- Bloody sputum, Difficulty Breathing, Snoring and Wheezing. Breast Not Present- Breast Mass, Breast Pain, Nipple Discharge and Skin Changes. Gastrointestinal Present- Hemorrhoids and Rectal Pain. Not Present- Abdominal Pain, Bloating, Bloody Stool, Change in Bowel Habits, Chronic diarrhea, Constipation, Difficulty Swallowing, Excessive gas, Gets full quickly at meals, Indigestion, Nausea and Vomiting. Female Genitourinary Not Present- Frequency, Nocturia, Painful Urination, Pelvic Pain and Urgency. Musculoskeletal Not  Present-  Back Pain, Joint Pain, Joint Stiffness, Muscle Pain, Muscle Weakness and Swelling of Extremities. Neurological Not Present- Decreased Memory, Fainting, Headaches, Numbness, Seizures, Tingling, Tremor, Trouble walking and Weakness. Psychiatric Not Present- Anxiety, Bipolar, Change in Sleep Pattern, Depression, Fearful and Frequent crying. Endocrine Not Present- Cold Intolerance, Excessive Hunger, Hair Changes, Heat Intolerance, Hot flashes and New Diabetes. Hematology Not Present- Blood Thinners, Easy Bruising, Excessive bleeding, Gland problems, HIV and Persistent Infections.  Vitals Sabino Gasser; 09/20/2017 11:18 AM) 09/20/2017 11:17 AM Weight: 211.5 lb Height: 59in Body Surface Area: 1.89 m Body Mass Index: 42.72 kg/m  Temp.: 98.38F(Oral)  Pulse: 108 (Regular)  BP: 136/82 (Sitting, Left Arm, Standard)       Physical Exam Harrell Gave M. Jawan Chavarria MD; 09/20/2017 11:46 AM) The physical exam findings are as follows: Note:Constitutional: No acute distress; conversant; no deformities Eyes: Moist conjunctiva; no lid lag; anicteric sclerae; pupils equal round and reactive to light Neck: Trachea midline; no palpable thyromegaly Lungs: Normal respiratory effort; no tactile fremitus CV: Regular rate and rhythm; no palpable thrill; no pitting edema GI: Abdomen soft, nontender, nondistended; no palpable hepatosplenomegaly Anorectal: 3 column external hemorrhoids-possible recent thromboses, firm nodular-like mass in the left lateral position, no surrounding erythema, no palpable fluctuance, unable to adequately visualize anal canal to inspect for fissure due to intolerance. Anoscopy and DRE was not attempted due to intolerance MSK: Normal gait; no clubbing/cyanosis Psychiatric: Appropriate affect; alert and oriented 3 Lymphatic: No palpable cervical or axillary lymphadenopathy **A chaperone, Lennart Pall, was present for the entire physical exam  Assessment & Plan   Impression: [name] is a very pleasant 12yoF with hx of HTN, DM here today with external hemorrhoids, probable recent thrombosis, and additionally a firm/nodular-like mass in the left lateral position  -We discussed the anatomy and physiology of the GI tract and pathophysiology of hemorrhoids as well as anal masses with associated illustrations. We discussed the possibility this could represent an underlying anal cancer. Given her poor tolerance in the office for exam as well as need for tissue biopsy, I recommended she undergo anal exam under anesthesia with possible biopsy.  -The planned procedure, material risks (including, but not limited to, pain, bleeding, infection, scarring, need for blood transfusion, damage to anal sphincter, incontinence of gas and/or stool, need for additional procedures, recurrence, pneumonia, heart attack, stroke, death) benefits and alternatives to surgery were again discussed at length. The patient's questions were answered to her satisfaction, she voiced understanding and elected to proceed with surgery. Additionally, we discussed typical postoperative expectations and the recovery process.  Sharon Mt. Dema Severin, M.D. [facility]

## 2017-10-03 NOTE — Transfer of Care (Signed)
Immediate Anesthesia Transfer of Care Note  Patient: Kathy [name]  Procedure(s) Performed: Procedure(s): ANAL EXAM UNDER ANESTHESIA (N/A) POSSIBLE BIOPSY RECTAL (N/A)  Patient Location: PACU  Anesthesia Type:General  Level of Consciousness:  sedated, patient cooperative and responds to stimulation  Airway & Oxygen Therapy:Patient Spontanous Breathing and Patient connected to face mask oxgen  Post-op Assessment:  Report given to PACU RN and Post -op Vital signs reviewed and stable  Post vital signs:  Reviewed and stable  Last Vitals:  Vitals:   10/03/17 0559 10/03/17 0933  BP: (!) 190/95 (!) 160/76  Pulse: 88   Resp: 16 18  Temp: 36.8 C 36.4 C  SpO2: 29% 51%    Complications: No apparent anesthesia complications

## 2017-10-03 NOTE — Anesthesia Procedure Notes (Signed)
Procedure Name: Intubation Date/Time: 10/03/2017 8:45 AM Performed by: Lavina Hamman, CRNA Pre-anesthesia Checklist: Patient identified, Emergency Drugs available, Suction available, Patient being monitored and Timeout performed Patient Re-evaluated:Patient Re-evaluated prior to induction Oxygen Delivery Method: Circle system utilized Preoxygenation: Pre-oxygenation with 100% oxygen Induction Type: IV induction Ventilation: Mask ventilation without difficulty Laryngoscope Size: Mac and 4 Grade View: Grade II Tube type: Oral Tube size: 7.0 mm Number of attempts: 1 Airway Equipment and Method: Stylet Placement Confirmation: ETT inserted through vocal cords under direct vision,  positive ETCO2,  CO2 detector and breath sounds checked- equal and bilateral Secured at: 21 cm Tube secured with: Tape Dental Injury: Teeth and Oropharynx as per pre-operative assessment

## 2017-10-03 NOTE — Anesthesia Postprocedure Evaluation (Signed)
Anesthesia Post Note  Patient: Surveyor, mining  Procedure(s) Performed: ANAL EXAM UNDER ANESTHESIA (N/A Anus) POSSIBLE BIOPSY RECTAL (N/A )     Patient location during evaluation: PACU Anesthesia Type: General Level of consciousness: awake Pain management: pain level controlled Vital Signs Assessment: post-procedure vital signs reviewed and stable Respiratory status: spontaneous breathing Cardiovascular status: stable Anesthetic complications: no    Last Vitals:  Vitals:   10/03/17 0938 10/03/17 0945  BP:  (!) 162/72  Pulse: 95 91  Resp: 20 19  Temp:    SpO2: 99% 100%    Last Pain:  Vitals:   10/03/17 1019  TempSrc:   PainSc: 0-No pain                 Labrian Torregrossa

## 2017-10-03 NOTE — Anesthesia Preprocedure Evaluation (Addendum)
Anesthesia Evaluation  Patient identified by MRN, date of birth, ID band Patient awake    Reviewed: Allergy & Precautions, NPO status , Patient's Chart, lab work & pertinent test results  Airway Mallampati: II  TM Distance: >3 FB     Dental   Pulmonary shortness of breath, asthma ,    breath sounds clear to auscultation       Cardiovascular hypertension,  Rhythm:Regular Rate:Normal     Neuro/Psych  Headaches,    GI/Hepatic Neg liver ROS, GERD  ,  Endo/Other  diabetes  Renal/GU      Musculoskeletal   Abdominal   Peds  Hematology   Anesthesia Other Findings   Reproductive/Obstetrics                            Anesthesia Physical Anesthesia Plan  ASA: III  Anesthesia Plan: General   Post-op Pain Management:    Induction: Intravenous, Cricoid pressure planned and Rapid sequence  PONV Risk Score and Plan: Treatment may vary due to age or medical condition, Ondansetron, Dexamethasone and Midazolam  Airway Management Planned: Oral ETT  Additional Equipment:   Intra-op Plan:   Post-operative Plan: Extubation in OR  Informed Consent:   Dental advisory given  Plan Discussed with: CRNA, Anesthesiologist and Surgeon  Anesthesia Plan Comments:       Anesthesia Quick Evaluation

## 2017-10-03 NOTE — Op Note (Addendum)
10/03/2017  9:20 AM  PATIENT:  Kathy [name]  62 y.o. female  Patient Care Team: Patient, No Pcp Per as PCP - General (General Practice)  PRE-OPERATIVE DIAGNOSIS:  anal pain, perianal nodules concerning for anal malignancy  POST-OPERATIVE DIAGNOSIS:  Same  PROCEDURE:   1. Anal exam under anesthesia 2. Biopsy of anal canal/perianal nodules  SURGEON:  Surgeon(s): Ileana Roup, MD  ASSISTANT: none   ANESTHESIA:   general  SPECIMEN:   1. Right anterior perianal nodule 2. Right posterior perianal nodule 3. Left lateral perianal nodule 4. Viral swab  DISPOSITION OF SPECIMEN:  PATHOLOGY (nodules) & Lab (viral swab)  COUNTS:  YES  PLAN OF CARE: Discharge to home after PACU  PATIENT DISPOSITION:  PACU - hemodynamically stable.  INDICATION:  [name] is a pleasant 59yoF with hx of HTN, DM, who presented to our clinic today in follow-up from an emergency department visit from 09/08/17 for anal pain. She described a one-year history of persistent anal pain with bowel movements. She described the pain as being sharp and knifelike. For the last couple of months she has also noted increase in the frequency of fecal incontinence which she says she never had issues with prior. She describes mucoid drainage/discharge from the anal area as well as some stool which occurs daily. She does not wear a pad or diaper. She does not alter her activities based on this. She notes some blood with her bowel movements intermittently. She is not taking a daily fiber supplement. She does report drinking 64 ounces of water per day. She has 2-3 semisolid stools per day. Denies history of constipation.  She reports having had a colonoscopy in 2017 while living in [place] [facility] [place]. She states she was told she had no polyps or other concerning findings. She was told she needed a repeat colonoscopy in 10 years  On exam, she had 3 column external  hemorrhoidal tags and firm nodular-like mass in the left lateral position.  She was unable to tolerate a digital rectal exam in the office.  Given her pain and exam findings, she was recommended to undergo exam under anesthesia and biopsy of anal canal lesions. Please refer to H&P regarding details of this discussion  OR FINDINGS: Shallow ulcerations versus excoriations of the perianal skin.  Viral swabs were obtained of this.  Nodular-like mass seen left laterally and anteriorly.  She also had firm nodularity of each of the respective hemorrhoidal columns.  Biopsies were taken of all of these.  DESCRIPTION: The patient was identified in the preoperative holding area and taken to the OR where they were placed on the operating room table. SCDs were placed.  General endotracheal anesthesia was induced without difficulty. The patient was then positioned in prone jackknife position with buttocks gently taped apart. Pressure points were examined and padded.  The patient was then prepped and draped in usual sterile fashion.  A surgical timeout was performed indicating the correct patient, procedure, positioning.  A rectal block was performed using Marcaine with epinephrine mixed with Exparel.  A digital rectal exam was performed and circumferential firm like mass was felt of the anal canal.  No palpable masses were noted in the distal rectum.  Marcaine with Exparel was injected as a pudendal nerve block bilaterally.  The firm nodular-like masses in the left lateral position as well as anterior positions were biopsied.  The external skin tags in the right anterior and right posterior positions were also firm and nodular-like  in appearance, all concerning for malignancy.  The skin tags were also excised for additional biopsy specimen.  A viral culture was performed with some excoriations versus ulcerations of the perianal skin.  All specimens were passed off individually labeled.  Additional local anesthetic was  infiltrated into the perianal area.  Hemostasis was then verified with electrocautery.  A piece of Proctofoam coated with topical anesthetic was applied to the anal canal.  A dressing consisting of 4 x 4's and an ABD pad were placed over the buttocks. The patient was then taken out of the prone jackknife position, rolled onto a stretcher, extubated, and transported to PACU in satisfactory condition.  DISPOSITION: PACU in satisfactory condition.  Note: This dictation was prepared with Dragon/digital dictation along with [employer]. Any transcriptional errors that result from this process are unintentional.

## 2017-10-03 NOTE — Discharge Instructions (Signed)
ANORECTAL SURGERY: POST OP INSTRUCTIONS  1. DIET: Follow a light bland diet the first 24 hours after arrival home, such as soup, liquids, crackers, etc.  Be sure to include lots of fluids daily.  Avoid fast food or heavy meals as your are more likely to get nauseated.  Eat a low fat diet the next few days after surgery.    2. Take your usually prescribed home medications unless otherwise directed.  3. PAIN CONTROL: a. It is helpful to take an over-the-counter pain medication regularly for the first few days/weeks.  Choose from the following that works best for you: i. Ibuprofen (Advil, etc) Three 250m tabs every 6 hours as needed. ii. Acetaminophen (Tylenol, etc) 500-6569mevery 6 hours as needed iii. NOTE: You may take both of these medications together - most patients find it most helpful when alternating between the two (i.e. Ibuprofen at 6am, tylenol at 9am, ibuprofen at 12pm ...) b. A  prescription for pain medication may have been prescribed for you at discharge.  Take your pain medication as prescribed.  i. If you are having problems/concerns with the prescription medicine, please call us[place]or further advice.  4. Avoid getting constipated.  Between the surgery and the pain medications, it is common to experience some constipation.  Increasing fluid intake (64oz of water per day) and taking a fiber supplement (such as Metamucil, Citrucel, FiberCon) 1-2 times a day regularly will usually help prevent this problem from occurring.  Take Miralax (over the counter) 1-2x/day (1-2 cap fulls per day) while taking a narcotic pain medication. If no bowel movement after 48hours, you may additionally take a laxative like a bottle of Milk of Magnesia which can be purchased over the counter. Avoid enemas if possible as these are often painful.   5. Watch out for diarrhea.  If you have many loose bowel movements, simplify your diet to bland foods.  Stop any stool softeners and decrease your fiber supplement.  If this worsens or does not improve, please call us[place]orea 6. Wash / shower every day.  If you were discharged with a dressing, you may remove this the day after your surgery. You may shower normally, getting soap/water on your wound, particularly after bowel movements.  7. Soaking in a warm bath filled a couple inches ("Sitz bath") is a great way to clean the area after a bowel movement and many patients find it is a way to soothe the area.  8. ACTIVITIES as tolerated:   a. You may resume regular (light) daily activities beginning the next day--such as daily self-care, walking, climbing stairs--gradually increasing activities as tolerated.  If you can walk 30 minutes without difficulty, it is safe to try more intense activity such as jogging, treadmill, bicycling, low-impact aerobics, etc. b. Refrain from any heavy lifting or straining for the first 2 weeks after your procedure, particularly if your surgery was for hemorrhoids. c. Avoid activities that make your pain worse d. You may drive when you are no longer taking prescription pain medication, you can comfortably wear a seatbelt, and you can safely maneuver your car and apply brakes.  9. FOLLOW UP in our office a. Please call [facility] at [phone] to set up an appointment to see your surgeon in the office for a follow-up appointment approximately 2 weeks after your surgery. b. Make sure that you call for this appointment the day you arrive home to insure a convenient appointment time.  9. If you have disability or family leave forms  that need to be completed, you may have them completed by your primary care physician's office; for return to work instructions, please ask our office staff and they will be happy to assist you in obtaining this documentation   When to call us [phone]: 1. Poor pain control 2. Reactions / problems with new medications (rash/itching, etc)  3. Fever over 101.5 F (38.5 C) 4. Inability to  urinate 5. Nausea/vomiting 6. Worsening swelling or bruising 7. Continued bleeding from incision. 8. Increased pain, redness, or drainage from the incision  The clinic staff is available to answer your questions during regular business hours (8:30am-5pm).  Please dont hesitate to call and ask to speak to one of our nurses for clinical concerns.   A surgeon from [facility] is always on call at the hospitals   If you have a medical emergency, go to the nearest emergency room or call 911.   [facility], [place], [place], [place], [place]  [address] ? MAIN: [phone] FAX [phone] [email or web address]

## 2017-10-04 ENCOUNTER — Encounter (HOSPITAL_COMMUNITY): Payer: Self-pay | Admitting: Surgery

## 2017-10-06 LAB — HSV CULTURE AND TYPING

## 2017-10-06 LAB — CHLAMYDIA CULTURE: Chlamydia Trachomatis Culture: NEGATIVE

## 2017-11-02 ENCOUNTER — Telehealth: Payer: Self-pay | Admitting: *Deleted

## 2017-11-02 NOTE — Telephone Encounter (Signed)
Dr Hilarie Fredrickson has received office note from [facility] from 10-24-17, Dr Dema Severin indicating he would like patient seen by our office for possible IBD. I have contacted patient regarding setting up an office appointment (per Dr Vena Rua request). She has financial constraints as she does not work and has no Insurance underwriter. However, she is advised that we can do a "1 time bill" and we can give her information to apply for financial assistance. She has gone ahead and scheduled an appointment with Dr Hilarie Fredrickson for 01/10/18 at 845 am.

## 2017-12-14 ENCOUNTER — Encounter: Payer: Self-pay | Admitting: Physician Assistant

## 2017-12-14 ENCOUNTER — Other Ambulatory Visit: Payer: Self-pay

## 2017-12-14 ENCOUNTER — Ambulatory Visit: Payer: Self-pay | Admitting: Physician Assistant

## 2017-12-14 VITALS — BP 180/98 | HR 97 | Temp 98.6°F | Resp 16 | Ht 59.45 in | Wt 214.0 lb

## 2017-12-14 DIAGNOSIS — E1165 Type 2 diabetes mellitus with hyperglycemia: Secondary | ICD-10-CM

## 2017-12-14 DIAGNOSIS — D509 Iron deficiency anemia, unspecified: Secondary | ICD-10-CM

## 2017-12-14 DIAGNOSIS — I119 Hypertensive heart disease without heart failure: Secondary | ICD-10-CM

## 2017-12-14 DIAGNOSIS — E785 Hyperlipidemia, unspecified: Secondary | ICD-10-CM

## 2017-12-14 DIAGNOSIS — Z8679 Personal history of other diseases of the circulatory system: Secondary | ICD-10-CM

## 2017-12-14 LAB — GLUCOSE, POCT (MANUAL RESULT ENTRY): POC Glucose: 161 mg/dl — AB (ref 70–99)

## 2017-12-14 MED ORDER — METOPROLOL SUCCINATE ER 50 MG PO TB24: 50.0000 mg | ORAL_TABLET | Freq: Every day | ORAL | 0 refills | Status: DC

## 2017-12-14 MED ORDER — GLIPIZIDE 5 MG PO TABS: 5.0000 mg | ORAL_TABLET | Freq: Every day | ORAL | 0 refills | Status: DC

## 2017-12-14 MED ORDER — SIMVASTATIN 10 MG PO TABS: 10.0000 mg | ORAL_TABLET | Freq: Every day | ORAL | 0 refills | Status: DC

## 2017-12-14 MED ORDER — AMLODIPINE BESYLATE 5 MG PO TABS: 5.0000 mg | ORAL_TABLET | Freq: Every day | ORAL | 0 refills | Status: DC

## 2017-12-14 MED ORDER — HYDROCHLOROTHIAZIDE 12.5 MG PO CAPS: 12.5000 mg | ORAL_CAPSULE | Freq: Every day | ORAL | 0 refills | Status: DC

## 2017-12-14 NOTE — Patient Instructions (Addendum)
Please make an appointment to see one of our providers in about 2 weeks for a blood pressure and vital signs check.  Please keep you phone close so you can establish with [facility].   Please purchase a glucose monitor and check you blood sugar each morning before you eat.  Please bring the results to your next appoitnment.   For you blood pressure I have prescribed a med to help your heart expand, a med to help you move some fluid out, and a medication to help you blood vessels relax.    For your diabetes I have prescribed glipizide at a low dose.    For you cholesterol I have prescribed the lowest dose of simvastatin.  You have not tried this med yet.   If you have lab work done today you will be contacted with your lab results within the next 2 weeks.  If you have not heard from [place] then please contact us. The fastest way to get your results is to register for My Chart.   IF you received an x-ray today, you will receive an invoice from [facility]. Please contact [facility] at [phone] with questions or concerns regarding your invoice.   IF you received labwork today, you will receive an invoice from [facility]. Please contact [facility] at [phone] with questions or concerns regarding your invoice.   Our billing staff will not be able to assist you with questions regarding bills from these companies.  You will be contacted with the lab results as soon as they are available. The fastest way to get your results is to activate your My Chart account. Instructions are located on the last page of this paperwork. If you have not heard from [place] regarding the results in 2 weeks, please contact this office.

## 2017-12-14 NOTE — Progress Notes (Signed)
12/14/2017 10:13 AM   DOB: July 01, 1955 / MRN: [id number]  SUBJECTIVE:  Kathy [name] is a 62 y.o. female presenting for medication refills.  She has a long history of HTN and most recent EKG shows LVH with an axis deviation.  She has an echo from 2012 that shows some diastolic dysfunction.  She agrees to cardiology eval today. She wants to go back on Norvasc mostly. She refuses to take ACE/ARB because she has heard they cause cancer.  She has a history of diabetes.  She refuses to take metformin.  She has no insurance.  She is not complaining of sock and glove paresthesia today. She also refuses to take lipitor and has pravastatin listed as an allergy. She has heard these cause cramping in the legs.     She is allergic to lisinopril and pravastatin sodium.   She  has a past medical history of Anal pain, Asthma, Diastolic dysfunction, Hyperlipidemia, Hypertension, and Type 2 diabetes mellitus ([place]).    She  reports that she has never smoked. She has never used smokeless tobacco. She reports that she does not drink alcohol or use drugs. She  has no sexual activity history on file. The patient  has a past surgical history that includes Cesarean section (1980s); Colonoscopy (2017); transthoracic echocardiogram (06/23/2009); Cardiovascular stress test (07/29/2009); and Rectal biopsy (N/A, 10/03/2017).  Her family history includes Diabetes in her maternal aunt; Hypertension (age of onset: 22) in her mother; Stroke (age of onset: 64) in her mother.  Review of Systems  Eyes: Negative.   Respiratory: Negative for cough and shortness of breath.   Cardiovascular: Negative for chest pain and leg swelling.  Skin: Negative for rash.  Neurological: Negative for dizziness.    The problem list and medications were reviewed and updated by myself where necessary and exist elsewhere in the encounter.   OBJECTIVE:  BP (!) 180/98   Pulse 97   Temp 98.6 F (37 C) (Oral)   Resp 16   Ht 4' 11.45"  (1.51 m)   Wt 214 lb (97.1 kg)   SpO2 99%   BMI 42.57 kg/m   Wt Readings from Last 3 Encounters:  12/14/17 214 lb (97.1 kg)  10/03/17 213 lb (96.6 kg)  05/04/17 216 lb 12.8 oz (98.3 kg)   Temp Readings from Last 3 Encounters:  12/14/17 98.6 F (37 C) (Oral)  10/03/17 97.7 F (36.5 C)  09/08/17 98.6 F (37 C)   BP Readings from Last 3 Encounters:  12/14/17 (!) 180/98  10/03/17 (!) 162/72  09/08/17 (!) 162/72   Pulse Readings from Last 3 Encounters:  12/14/17 97  10/03/17 91  09/08/17 83    Physical Exam  Constitutional: She is oriented to person, place, and time. She appears well-nourished.  Non-toxic appearance. No distress.  Eyes: Pupils are equal, round, and reactive to light. EOM are normal.  Cardiovascular: Normal rate, regular rhythm, S1 normal, S2 normal, normal heart sounds and intact distal pulses. Exam reveals no gallop, no friction rub and no decreased pulses.  No murmur heard. Pulmonary/Chest: Effort normal. No stridor. No respiratory distress. She has no wheezes. She has no rales.  Abdominal: She exhibits no distension.  Musculoskeletal: She exhibits no edema.  Neurological: She is alert and oriented to person, place, and time. She has normal strength and normal reflexes. She is not disoriented. She displays no atrophy. No cranial nerve deficit or sensory deficit. She exhibits normal muscle tone. Coordination and gait normal.  Skin: Skin is  warm and dry. She is not diaphoretic. No pallor.  Psychiatric: She has a normal mood and affect. Her behavior is normal.  Vitals reviewed.   Lab Results  Component Value Date   HGBA1C 6.8 (H) 10/03/2017    Lab Results  Component Value Date   WBC 5.9 10/03/2017   HGB 11.9 (L) 10/03/2017   HCT 37.9 10/03/2017   MCV 71.9 (L) 10/03/2017   PLT 332 10/03/2017    Lab Results  Component Value Date   CREATININE 1.16 (H) 10/03/2017   BUN 16 10/03/2017   NA 140 10/03/2017   K 4.2 10/03/2017   CL 103 10/03/2017    CO2 29 10/03/2017    Lab Results  Component Value Date   ALT 17 05/15/2010   AST 17 05/15/2010   ALKPHOS 143 (H) 05/15/2010   BILITOT 0.3 05/15/2010    Lab Results  Component Value Date   TSH 1.47 04/28/2010    Lab Results  Component Value Date   CHOL 172 04/28/2010   HDL 37.90 (L) 04/28/2010   LDLCALC 114 (H) 04/28/2010   TRIG 102.0 04/28/2010   CHOLHDL 5 04/28/2010   Lipid Panel     Component Value Date/Time   CHOL 172 04/28/2010 1138   TRIG 102.0 04/28/2010 1138   HDL 37.90 (L) 04/28/2010 1138   CHOLHDL 5 04/28/2010 1138   VLDL 20.4 04/28/2010 1138   LDLCALC 114 (H) 04/28/2010 1138      ASSESSMENT AND PLAN:  Ileen was seen today for hypertension and diabetes.  Diagnoses and all orders for this visit:  History of diastolic dysfunction -     Creatinine, serum -     Lipid panel -     metoprolol succinate (TOPROL-XL) 50 MG 24 hr tablet; Take 1 tablet (50 mg total) by mouth daily. -     hydrochlorothiazide (MICROZIDE) 12.5 MG capsule; Take 1 capsule (12.5 mg total) by mouth daily. For blood pressure.  Hypertensive heart disease, unspecified whether heart failure present -     Creatinine, serum -     Potassium -     amLODipine (NORVASC) 5 MG tablet; Take 1 tablet (5 mg total) by mouth daily. -     Ambulatory referral to Cardiology  Uncontrolled type 2 diabetes mellitus with hyperglycemia ([place]) -     Hemoglobin A1c -     POCT glucose (manual entry) -     simvastatin (ZOCOR) 10 MG tablet; Take 1 tablet (10 mg total) by mouth at bedtime.  Microcytic anemia -     Iron, TIBC and Ferritin Panel  Dyslipidemia -     POCT glucose (manual entry)  Other orders -     glipiZIDE (GLUCOTROL) 5 MG tablet; Take 1 tablet (5 mg total) by mouth daily before breakfast.    The patient is advised to call or return to clinic if she does not see an improvement in symptoms, or to seek the care of the closest emergency department if she worsens with the above plan.    Philis Fendt, MHS, PA-C [facility] 12/14/2017 10:13 AM

## 2017-12-15 LAB — POTASSIUM: Potassium: 4.7 mmol/L (ref 3.5–5.2)

## 2017-12-15 LAB — CREATININE, SERUM
Creatinine, Ser: 1.06 mg/dL — ABNORMAL HIGH (ref 0.57–1.00)
GFR calc non Af Amer: 56 mL/min/{1.73_m2} — ABNORMAL LOW (ref >59)
GFR, EST AFRICAN AMERICAN: 64 mL/min/{1.73_m2} (ref >59)

## 2017-12-15 LAB — IRON,TIBC AND FERRITIN PANEL
Ferritin: 103 ng/mL (ref 15–150)
IRON: 35 ug/dL (ref 27–139)
Iron Saturation: 13 % — ABNORMAL LOW (ref 15–55)
TIBC: 274 ug/dL (ref 250–450)
UIBC: 239 ug/dL (ref 118–369)

## 2017-12-15 LAB — LIPID PANEL
CHOLESTEROL TOTAL: 177 mg/dL (ref 100–199)
Chol/HDL Ratio: 4.3 ratio (ref 0.0–4.4)
HDL: 41 mg/dL (ref >39)
LDL CALC: 114 mg/dL — AB (ref 0–99)
Triglycerides: 112 mg/dL (ref 0–149)
VLDL CHOLESTEROL CAL: 22 mg/dL (ref 5–40)

## 2017-12-15 LAB — HEMOGLOBIN A1C
ESTIMATED AVERAGE GLUCOSE: 148 mg/dL
Hgb A1c MFr Bld: 6.8 % — ABNORMAL HIGH (ref 4.8–5.6)

## 2018-01-05 ENCOUNTER — Encounter: Payer: Self-pay | Admitting: *Deleted

## 2018-01-10 ENCOUNTER — Other Ambulatory Visit (INDEPENDENT_AMBULATORY_CARE_PROVIDER_SITE_OTHER): Payer: Self-pay

## 2018-01-10 ENCOUNTER — Ambulatory Visit (INDEPENDENT_AMBULATORY_CARE_PROVIDER_SITE_OTHER): Payer: Self-pay | Admitting: Internal Medicine

## 2018-01-10 ENCOUNTER — Encounter: Payer: Self-pay | Admitting: Internal Medicine

## 2018-01-10 ENCOUNTER — Ambulatory Visit (INDEPENDENT_AMBULATORY_CARE_PROVIDER_SITE_OTHER)
Admission: RE | Admit: 2018-01-10 | Discharge: 2018-01-10 | Disposition: A | Discharge status: Home / Self Care | Payer: Self-pay | Source: Ambulatory Visit | Attending: Internal Medicine | Admitting: Internal Medicine

## 2018-01-10 VITALS — BP 100/78 | HR 84 | Ht 58.75 in | Wt 211.0 lb

## 2018-01-10 DIAGNOSIS — K6289 Other specified diseases of anus and rectum: Secondary | ICD-10-CM

## 2018-01-10 DIAGNOSIS — R109 Unspecified abdominal pain: Secondary | ICD-10-CM

## 2018-01-10 DIAGNOSIS — L929 Granulomatous disorder of the skin and subcutaneous tissue, unspecified: Secondary | ICD-10-CM

## 2018-01-10 DIAGNOSIS — R630 Anorexia: Secondary | ICD-10-CM

## 2018-01-10 DIAGNOSIS — R1084 Generalized abdominal pain: Secondary | ICD-10-CM

## 2018-01-10 LAB — COMPREHENSIVE METABOLIC PANEL
ALK PHOS: 109 U/L (ref 39–117)
ALT: 9 U/L (ref 0–35)
AST: 10 U/L (ref 0–37)
Albumin: 3.8 g/dL (ref 3.5–5.2)
BILIRUBIN TOTAL: 0.3 mg/dL (ref 0.2–1.2)
BUN: 18 mg/dL (ref 6–23)
CO2: 27 mEq/L (ref 19–32)
Calcium: 9.9 mg/dL (ref 8.4–10.5)
Chloride: 99 mEq/L (ref 96–112)
Creatinine, Ser: 1.28 mg/dL — ABNORMAL HIGH (ref 0.40–1.20)
GFR: 53.91 mL/min — AB (ref >60.00)
Glucose, Bld: 159 mg/dL — ABNORMAL HIGH (ref 70–99)
POTASSIUM: 3.8 meq/L (ref 3.5–5.1)
Sodium: 138 mEq/L (ref 135–145)
TOTAL PROTEIN: 8.6 g/dL — AB (ref 6.0–8.3)

## 2018-01-10 LAB — CBC WITH DIFFERENTIAL/PLATELET
BASOS ABS: 0 10*3/uL (ref 0.0–0.1)
Basophils Relative: 0.6 % (ref 0.0–3.0)
EOS PCT: 3.5 % (ref 0.0–5.0)
Eosinophils Absolute: 0.3 10*3/uL (ref 0.0–0.7)
HCT: 36.6 % (ref 36.0–46.0)
HEMOGLOBIN: 11.5 g/dL — AB (ref 12.0–15.0)
Lymphocytes Relative: 19.7 % (ref 12.0–46.0)
Lymphs Abs: 1.6 10*3/uL (ref 0.7–4.0)
MCHC: 31.4 g/dL (ref 30.0–36.0)
MCV: 69.8 fl — ABNORMAL LOW (ref 78.0–100.0)
MONO ABS: 0.7 10*3/uL (ref 0.1–1.0)
Monocytes Relative: 8.6 % (ref 3.0–12.0)
Neutro Abs: 5.5 10*3/uL (ref 1.4–7.7)
Neutrophils Relative %: 67.6 % (ref 43.0–77.0)
Platelets: 378 10*3/uL (ref 150.0–400.0)
RBC: 5.24 Mil/uL — ABNORMAL HIGH (ref 3.87–5.11)
RDW: 15.1 % (ref 11.5–15.5)
WBC: 8.1 10*3/uL (ref 4.0–10.5)

## 2018-01-10 LAB — HIGH SENSITIVITY CRP: CRP HIGH SENSITIVITY: 58.89 mg/L — AB (ref 0.000–5.000)

## 2018-01-10 LAB — SEDIMENTATION RATE: SED RATE: 130 mm/h — AB (ref 0–30)

## 2018-01-10 NOTE — Progress Notes (Signed)
Patient ID: Kathy [name], female   DOB: [birth year], 62 y.o.   MRN: [id number] HPI: Kathy [name] is a 62 year old female with a past medical history of DM2, HTN, HL, asthma, seen in consultation at the request of Dr. Annye English to evaluate perianal pain and swelling after exam under anesthesia revealed granulomatous inflammation of the distal rectum/anal canal.  She is here alone today.  She reports that since around May 2018 she has been dealing with painful perianal swelling.  Initially she thought this was a very "painful hemorrhoid" though she is never had hemorrhoidal problems in the past.  She was having perianal drainage, pain with passing stool, sitting and walking.  She was having at times urgent bowel movements and at times had uncontrollable bowel movement.  She reports it feels painful to sit at times she has had blood in her stool.  She reports it is very painful to wipe and clean her anus and perianal skin.  She is used Vaseline as a protectant but has not used any other medication.  Previously she was having more of a diarrhea type response but more recently stools have been formed and at times mildly constipated.  She is taking Metamucil or equivalent fiber supplement and MiraLAX 17 g on a near daily basis.  In addition to her perianal symptoms she has had decreased appetite and a mid abdominal pain.  This pain is also present at times in the epigastrium.  It is present with or without eating though there are times when it seems worse with eating.  She has not lost significant weight.  She is not having frequent heartburn and denies dysphagia and odynophagia.  She had a colonoscopy in 2017 in [place] which she reports was normal except for internal hemorrhoids.  No family history of GI tract malignancy.  No family history of sarcoidosis.  She is currently unemployed though after relocating to [place] from [place] she was looking for a job but simply does not feel that she  has energy to maintain employment at present.  She does not use tobacco, never has.  No alcohol.  No illicit drug use.  Dr. Dema Severin took the patient to the operating room in June 2019 for exam under anesthesia.  This showed right anterior and posterior perianal nodules as well as left lateral perianal nodules.  These were biopsied and showed granulomatous inflammation without dysplasia or malignancy in all biopsy specimens.  Pathology revealed abundant noncaseating granulomas with associated chronic inflammation.  There were giant cells with foreign body material.  The differential was felt to include inflammation associated with a fistula, infection, sarcoid or less likely IBD.  GMS, PAS and AFB stains were negative for organisms.  There was no malignancy.  She denies family history of sarcoidosis.  Past Medical History:  Diagnosis Date  . Anal pain   . Asthma    09-23-2017  per pt has not done inhaler since May 2018, no insurance (QVAR bid and Ventolin prn)  . Diastolic dysfunction    per echo [id number]  grade 2 diastolic dysfunction , ef 60-65%  . GERD (gastroesophageal reflux disease)   . Hemorrhoids   . Hyperlipidemia    stopped meds due to side effects  . Hypertension    09-23-2017  per pt has takes bp meds since May 2018 no insurance (losartan 163m qd, HCTZ 12.558mqd, and toprol 50 qd)  . Type 2 diabetes mellitus ([place]   09-23-2017 per pt has not taken diabetic meds since  May 2018, no insurance (kombiglyze 5-1043m qd)    Past Surgical History:  Procedure Laterality Date  . CARDIOVASCULAR STRESS TEST  07/29/2009   normal nuclear study w/ no ischemia/  normal LV function and wall motion,  ef 70%  . CESAREAN SECTION  1980s  . COLONOSCOPY  2017  . RECTAL BIOPSY N/A 10/03/2017   Procedure: POSSIBLE BIOPSY RECTAL;  Surgeon: WIleana Roup MD;  Location: [facility];  Service: General;  Laterality: N/A;  . TRANSTHORACIC ECHOCARDIOGRAM  06/23/2009   ef 60-65%,  grade 2 diastolic  dysfunction/  trivial TR    Outpatient Medications Prior to Visit  Medication Sig Dispense Refill  . amLODipine (NORVASC) 5 MG tablet Take 1 tablet (5 mg total) by mouth daily. 90 tablet 0  . glipiZIDE (GLUCOTROL) 5 MG tablet Take 1 tablet (5 mg total) by mouth daily before breakfast. 90 tablet 0  . hydrochlorothiazide (MICROZIDE) 12.5 MG capsule Take 1 capsule (12.5 mg total) by mouth daily. For blood pressure. 90 capsule 0  . metoprolol succinate (TOPROL-XL) 50 MG 24 hr tablet Take 1 tablet (50 mg total) by mouth daily. 90 tablet 0  . simvastatin (ZOCOR) 10 MG tablet Take 1 tablet (10 mg total) by mouth at bedtime. 90 tablet 0  . aspirin 81 MG tablet Take 81 mg by mouth daily.      .Marland Kitchenglucose blood (FREESTYLE LITE) test strip 1 each by Other route as needed. Use as instructed     . losartan (COZAAR) 100 MG tablet Take 100 mg by mouth daily.       No facility-administered medications prior to visit.     Allergies  Allergen Reactions  . Lipitor [Atorvastatin Calcium]     Leg cramps   . Lisinopril Itching  . Pravachol [Pravastatin Sodium]   . Pravastatin Sodium Other (See Comments)    leg cramps    Family History  Problem Relation Age of Onset  . Hypertension Mother 753 . Stroke Mother 735 . Diabetes Maternal Aunt        s/p bilater amputation due to DM  . Asthma Father     Social History   Tobacco Use  . Smoking status: Never Smoker  . Smokeless tobacco: Never Used  Substance Use Topics  . Alcohol use: No    Frequency: Never  . Drug use: No    ROS: As per history of present illness, otherwise negative  BP 100/78 (BP Location: Left Wrist, Patient Position: Sitting, Cuff Size: Normal)   Pulse 84   Ht 4' 10.75" (1.492 m) Comment: height measured without shoes  Wt 211 lb (95.7 kg)   BMI 42.98 kg/m  Constitutional: Well-developed and well-nourished. No distress. HEENT: Normocephalic and atraumatic. Oropharynx is clear and moist. Conjunctivae are normal.  No scleral  icterus. Neck: Neck supple. Trachea midline. Cardiovascular: Normal rate, regular rhythm and intact distal pulses. No M/R/G Pulmonary/chest: Effort normal and breath sounds normal. No wheezing, rales or rhonchi. Abdominal: Soft, epigastric and midabdominal tenderness without rebound or guarding, nondistended. Bowel sounds active throughout.  Rectal: External exam only reveals tender nodular mucosa at the anus which is erythematous, there is skin breakdown in the intergluteal cleft which is tender, there is evidence of fistulous type communication without purulent drainage in the left lateral position (very site of prior biopsy) Extremities: no clubbing, cyanosis, trace pretibial edema Neurological: Alert and oriented to person place and time. Skin: Skin is warm and dry.  Psychiatric: Normal mood and affect. Behavior is  normal.  RELEVANT LABS AND IMAGING: CBC    Component Value Date/Time   WBC 5.9 10/03/2017 0649   RBC 5.27 (H) 10/03/2017 0649   HGB 11.9 (L) 10/03/2017 0649   HCT 37.9 10/03/2017 0649   PLT 332 10/03/2017 0649   MCV 71.9 (L) 10/03/2017 0649   MCH 22.6 (L) 10/03/2017 0649   MCHC 31.4 10/03/2017 0649   RDW 16.0 (H) 10/03/2017 0649   LYMPHSABS 2.0 04/28/2010 1138   MONOABS 0.3 04/28/2010 1138   EOSABS 0.2 04/28/2010 1138   BASOSABS 0.0 04/28/2010 1138    CMP     Component Value Date/Time   NA 140 10/03/2017 0753   K 4.7 12/14/2017 1009   CL 103 10/03/2017 0753   CO2 29 10/03/2017 0753   GLUCOSE 170 (H) 10/03/2017 0753   BUN 16 10/03/2017 0753   CREATININE 1.06 (H) 12/14/2017 1009   CALCIUM 9.2 10/03/2017 0753   PROT 7.7 05/15/2010 0959   ALBUMIN 3.8 05/15/2010 0959   AST 17 05/15/2010 0959   ALT 17 05/15/2010 0959   ALKPHOS 143 (H) 05/15/2010 0959   BILITOT 0.3 05/15/2010 0959   GFRNONAA 56 (L) 12/14/2017 1009   GFRAA 64 12/14/2017 1009    ASSESSMENT/PLAN: 62 year old female with a past medical history of DM2, HTN, HL, asthma, seen in consultation at  the request of Dr. Annye English to evaluate perianal pain and swelling after exam under anesthesia revealed granulomatous inflammation of the distal rectum/anal canal.  1.  Granulomatous inflammation anal canal/peri-anal skin/moderate to severe perianal and anal pain/abdominal pain/poor appetite --Unfortunately she has been dealing with severe perianal discomfort for over a year.  She does not have insurance and I have encouraged her to apply for [facility] assistance program. --Differential includes perianal Crohn's disease though with the extensive noncaseating granuloma I am also concerned that she may have sarcoid.  Perianal sarcoid is rare particularly in the absence of more diffuse sarcoidosis.  For this reason I have recommended the following evaluation to better determine the etiology of this inflammation so we can pick appropriate treatment --Blood work today to include CBC, CMP, CRP, ESR, ACE level, IBD extended panel --Chest x-ray to evaluate for hilar adenopathy --Abdominal and pelvic CT scan with contrast --evaluate mid abdominal pain, evaluate for IBD, evaluate for evidence of intra-abdominal sarcoidosis --Upper endoscopy and colonoscopy --rule out GI sarcoid and IBD/Crohn's disease.  Discussed the risk, benefits and alternatives to upper endoscopy and colonoscopy and she is agreeable and wishes to proceed --Obtain records from prior colonoscopy from [facility] and clinic in [place] per box instruction for perianal pain.  Desitin over-the-counter as a barrier protectant for perianal skin.  Oral pain medication offered, patient declines for now. --Continue MiraLAX and fiber supplement to avoid constipation/hard stool --Plans to establish soon with primary care which I encouraged her to follow through with     Cc: Dr. Ruffin [facility]

## 2018-01-10 NOTE — Patient Instructions (Addendum)
We have requested your 2017 colonoscopy from [facility] in [place], [place] ________________________________________________________________________ Your provider has requested that you go to the basement level for lab work before leaving today. Press "B" on the elevator. The lab is located at the first door on the left as you exit the elevator. _________________________________________________________________________ Kathy [name] have been scheduled for an endoscopy and colonoscopy. Please follow the written instructions given to you at your visit today. Please pick up your prep supplies at the pharmacy within the next 1-3 days. If you use inhalers (even only as needed), please bring them with you on the day of your procedure. Your physician has requested that you go to www.startemmi.com and enter the access code given to you at your visit today. This web site gives a general overview about your procedure. However, you should still follow specific instructions given to you by our office regarding your preparation for the procedure. _________________________________________________________________________ Your provider has requested that you have a chest x ray before leaving today. Please go to the basement floor to our Radiology department for the test. ________________________________________________________________________ You have been scheduled for a CT scan of the abdomen and pelvis at [facility] ([address]---this is in the same building as Press photographer).   You are scheduled on Thursday, 01/12/18 at 9:00 am. You should arrive 15 minutes prior to your appointment time for registration. Please follow the written instructions below on the day of your exam:  WARNING: IF YOU ARE ALLERGIC TO IODINE/X-RAY DYE, PLEASE NOTIFY RADIOLOGY IMMEDIATELY AT [phone]! YOU WILL BE GIVEN A 13 HOUR PREMEDICATION PREP.  1) Do not eat or drink anything after 5:00 am (4 hours  prior to your test) 2) You have been given 2 bottles of oral contrast to drink. The solution may taste better if refrigerated, but do NOT add ice or any other liquid to this solution. Shake well before drinking.    Drink 1 bottle of contrast @ 7:00 am (2 hours prior to your exam)  Drink 1 bottle of contrast @ 8:00 am (1 hour prior to your exam)  You may take any medications as prescribed with a small amount of water except for the following: Metformin, Glucophage, Glucovance, Avandamet, Riomet, Fortamet, Actoplus Met, Janumet, Glumetza or Metaglip. The above medications must be held the day of the exam AND 48 hours after the exam.  The purpose of you drinking the oral contrast is to aid in the visualization of your intestinal tract. The contrast solution may cause some diarrhea. Before your exam is started, you will be given a small amount of fluid to drink. Depending on your individual set of symptoms, you may also receive an intravenous injection of x-ray contrast/dye. Plan on being at [facility] for 30 minutes or longer, depending on the type of exam you are having performed.  This test typically takes 30-45 minutes to complete.  If you have any questions regarding your exam or if you need to reschedule, you may call the CT department at [phone] between the hours of 8:00 am and 5:00 pm, Monday-Friday.  ________________________________________________________________________ Kathy [name] may purchase Recticare (lidocaine 5% gel) over the counter and apply to the rectum as needed. You may also purchase Desitin over the counter an apply as necessary to the rectum. ________________________________________________________________________  If you are age 30 or older, your body mass index should be between 23-30. Your Body mass index is 42.98 kg/m. If this is out of the aforementioned range listed, please consider follow up  with your Primary Care Provider.  If you are age 53 or younger, your  body mass index should be between 19-25. Your Body mass index is 42.98 kg/m. If this is out of the aformentioned range listed, please consider follow up with your Primary Care Provider.

## 2018-01-11 ENCOUNTER — Other Ambulatory Visit: Payer: Self-pay

## 2018-01-11 ENCOUNTER — Other Ambulatory Visit (INDEPENDENT_AMBULATORY_CARE_PROVIDER_SITE_OTHER): Payer: Self-pay

## 2018-01-11 DIAGNOSIS — D860 Sarcoidosis of lung: Secondary | ICD-10-CM

## 2018-01-11 DIAGNOSIS — D509 Iron deficiency anemia, unspecified: Secondary | ICD-10-CM

## 2018-01-11 LAB — FERRITIN: Ferritin: 62.1 ng/mL (ref 10.0–291.0)

## 2018-01-11 LAB — IBC PANEL
Iron: 39 ug/dL — ABNORMAL LOW (ref 42–145)
SATURATION RATIOS: 11.9 % — AB (ref 20.0–50.0)
TRANSFERRIN: 235 mg/dL (ref 212.0–360.0)

## 2018-01-12 ENCOUNTER — Ambulatory Visit (INDEPENDENT_AMBULATORY_CARE_PROVIDER_SITE_OTHER)
Admission: RE | Admit: 2018-01-12 | Discharge: 2018-01-12 | Disposition: A | Discharge status: Home / Self Care | Payer: Self-pay | Source: Ambulatory Visit | Attending: Internal Medicine | Admitting: Internal Medicine

## 2018-01-12 DIAGNOSIS — L929 Granulomatous disorder of the skin and subcutaneous tissue, unspecified: Secondary | ICD-10-CM

## 2018-01-12 DIAGNOSIS — R1084 Generalized abdominal pain: Secondary | ICD-10-CM

## 2018-01-12 DIAGNOSIS — K6289 Other specified diseases of anus and rectum: Secondary | ICD-10-CM

## 2018-01-12 LAB — ANGIOTENSIN CONVERTING ENZYME: ANGIOTENSIN-CONVERTING ENZYME: 45 U/L (ref 9–67)

## 2018-01-12 MED ORDER — IOPAMIDOL (ISOVUE-300) INJECTION 61%
100.0000 mL | Freq: Once | INTRAVENOUS | Status: AC | PRN
  Administered 2018-01-12: 100 mL via INTRAVENOUS

## 2018-01-17 LAB — IBD EXPANDED PANEL
ACCA: 39 units (ref 0–90)
ALCA: 48 units (ref 0–60)
AMCA: 43 U (ref 0–100)
ATYPICAL PANCA: NEGATIVE
gASCA: 36 units (ref 0–50)

## 2018-01-18 ENCOUNTER — Ambulatory Visit (AMBULATORY_SURGERY_CENTER): Payer: Self-pay | Admitting: Internal Medicine

## 2018-01-18 ENCOUNTER — Encounter: Payer: Self-pay | Admitting: Internal Medicine

## 2018-01-18 VITALS — BP 141/70 | HR 84 | Temp 98.2°F | Resp 17 | Ht <= 58 in | Wt 211.0 lb

## 2018-01-18 DIAGNOSIS — K6389 Other specified diseases of intestine: Secondary | ICD-10-CM

## 2018-01-18 DIAGNOSIS — L929 Granulomatous disorder of the skin and subcutaneous tissue, unspecified: Secondary | ICD-10-CM

## 2018-01-18 DIAGNOSIS — K2950 Unspecified chronic gastritis without bleeding: Secondary | ICD-10-CM

## 2018-01-18 DIAGNOSIS — K6289 Other specified diseases of anus and rectum: Secondary | ICD-10-CM

## 2018-01-18 MED ORDER — OMEPRAZOLE 40 MG PO CPDR: 40.0000 mg | DELAYED_RELEASE_CAPSULE | Freq: Every day | ORAL | 3 refills | Status: DC

## 2018-01-18 MED ORDER — SODIUM CHLORIDE 0.9 % IV SOLN: 500.0000 mL | Freq: Once | INTRAVENOUS | Status: DC

## 2018-01-18 MED ORDER — PREDNISONE 10 MG PO TABS: 40.0000 mg | ORAL_TABLET | Freq: Every day | ORAL | 0 refills | Status: DC

## 2018-01-18 NOTE — Progress Notes (Signed)
A and O x3. Report to RN. Tolerated MAC anesthesia well.Teeth unchanged after procedure.

## 2018-01-18 NOTE — Op Note (Signed)
[facility] Patient Name: Kathy [name] Procedure Date: 01/18/2018 2:05 PM MRN: [id number] Endoscopist: Jerene Bears , MD Age: 62 Referring MD:  Date of Birth: 1955-12-25 Gender: Female Account #: [id number] Procedure:                Upper GI endoscopy Indications:              Generalized abdominal pain, nausea, poor appetite,                            very elevated inflammatory markers, perianal                            pain/masslike lesion biopsies show noncaseating                            granulomas, rule out upper GI Crohn's versus                            sarcoidosis Medicines:                Monitored Anesthesia Care Procedure:                Pre-Anesthesia Assessment:                           - Prior to the procedure, a History and Physical                            was performed, and patient medications and                            allergies were reviewed. The patient's tolerance of                            previous anesthesia was also reviewed. The risks                            and benefits of the procedure and the sedation                            options and risks were discussed with the patient.                            All questions were answered, and informed consent                            was obtained. Prior Anticoagulants: The patient has                            taken no previous anticoagulant or antiplatelet                            agents. ASA Grade Assessment: II - A patient with  mild systemic disease. After reviewing the risks                            and benefits, the patient was deemed in                            satisfactory condition to undergo the procedure.                           After obtaining informed consent, the endoscope was                            passed under direct vision. Throughout the                            procedure, the patient's blood pressure, pulse, and                            oxygen saturations were monitored continuously. The                            Endoscope was introduced through the mouth, and                            advanced to the second part of duodenum. The upper                            GI endoscopy was accomplished without difficulty.                            The patient tolerated the procedure well. Scope In: Scope Out: Findings:                 LA Grade A (one or more mucosal breaks less than 5                            mm, not extending between tops of 2 mucosal folds)                            esophagitis with no bleeding was found 34 to 35 cm                            from the incisors.                           A 2 cm hiatal hernia was present.                           The entire examined stomach was normal. Biopsies                            were taken with a cold forceps for histology.  The examined duodenum was normal. Complications:            No immediate complications. Estimated Blood Loss:     Estimated blood loss was minimal. Impression:               - Mild reflux esophagitis.                           - 2 cm hiatal hernia.                           - Normal stomach. Biopsied.                           - Normal examined duodenum. Recommendation:           - Patient has a contact number available for                            emergencies. The signs and symptoms of potential                            delayed complications were discussed with the                            patient. Return to normal activities tomorrow.                            Written discharge instructions were provided to the                            patient.                           - Resume previous diet.                           - Continue present medications.                           - Begin omeprazole 40 mg once daily even esophagitis                           - Await pathology results.                            - See the other procedure note for documentation of                            additional recommendations. Jerene Bears, MD 01/18/2018 2:48:22 PM This report has been signed electronically.

## 2018-01-18 NOTE — Progress Notes (Signed)
Surg path bottle #1 was gastric antrum & body cb r/o gastric sarcoid specimen was put in computer as lower procedure.  Error it was upper procedure.  maw

## 2018-01-18 NOTE — Patient Instructions (Addendum)
YOU HAD AN ENDOSCOPIC PROCEDURE TODAY AT [facility]:   Refer to the procedure report that was given to you for any specific questions about what was found during the examination.  If the procedure report does not answer your questions, please call your gastroenterologist to clarify.  If you requested that your care partner not be given the details of your procedure findings, then the procedure report has been included in a sealed envelope for you to review at your convenience later.  YOU SHOULD EXPECT: Some feelings of bloating in the abdomen. Passage of more gas than usual.  Walking can help get rid of the air that was put into your GI tract during the procedure and reduce the bloating. If you had a lower endoscopy (such as a colonoscopy or flexible sigmoidoscopy) you may notice spotting of blood in your stool or on the toilet paper. If you underwent a bowel prep for your procedure, you may not have a normal bowel movement for a few days.  Please Note:  You might notice some irritation and congestion in your nose or some drainage.  This is from the oxygen used during your procedure.  There is no need for concern and it should clear up in a day or so.  SYMPTOMS TO REPORT IMMEDIATELY:   Following lower endoscopy (colonoscopy or flexible sigmoidoscopy):  Excessive amounts of blood in the stool  Significant tenderness or worsening of abdominal pains  Swelling of the abdomen that is new, acute  Fever of 100F or higher   Following upper endoscopy (EGD)  Vomiting of blood or coffee ground material  New chest pain or pain under the shoulder blades  Painful or persistently difficult swallowing  New shortness of breath  Fever of 100F or higher  Black, tarry-looking stools  For urgent or emergent issues, a gastroenterologist can be reached at any hour by calling [phone].   DIET:  We do recommend a small meal at first, but then you may proceed to your regular diet.  Drink  plenty of fluids but you should avoid alcoholic beverages for 24 hours.  MEDICATIONS: Continue present medications. Begin Omeprazole 40 mg by mouth once daily. Start Prednisone 40 mg by mouth once daily until you see Dr. Lake Bells next month at which time we may discuss tapering (decreasing) the Prednisone. Use Mirilax 17 grams daily to avoid hard stools/constipation (this is an over the counter med).   FOLLOW UP: Follow up with Dr. Raquel James in his office November 7th at 0900.  Please see handouts given to you by your recovery nurse.  DO NOT DRIVE or use heavy machinery until tomorrow (because of the sedation medicines used during the test).    FOLLOW UP: Our staff will call the number listed on your records the next business day following your procedure to check on you and address any questions or concerns that you may have regarding the information given to you following your procedure. If we do not reach you, we will leave a message.  However, if you are feeling well and you are not experiencing any problems, there is no need to return our call.  We will assume that you have returned to your regular daily activities without incident.  If any biopsies were taken you will be contacted by phone or by letter within the next 1-3 weeks.  Please call us at [phone] if you have not heard about the biopsies in 3 weeks.   Thank you for allowing [place]  to provide for your healthcare needs today.  SIGNATURES/CONFIDENTIALITY: You and/or your care partner have signed paperwork which will be entered into your electronic medical record.  These signatures attest to the fact that that the information above on your After Visit Summary has been reviewed and is understood.  Full responsibility of the confidentiality of this discharge information lies with you and/or your care-partner.

## 2018-01-18 NOTE — Op Note (Signed)
[place] Patient Name: Kathy [name] Procedure Date: 01/18/2018 2:05 PM MRN: [id number] Endoscopist: Jerene Bears , MD Age: 62 Referring MD:  Date of Birth: [birth year] Gender: Female Account #: [id number] Procedure:                Colonoscopy Indications:              Generalized abdominal pain, Abnormal CT of the GI                            tract showing rectal and anus thickening, Rectal                            pain, anal pain, anal mass which was biopsied                            recently at exam under anesthesia with Dr. Dema Severin                            showing noncaseating granulomatous inflammation,                            very elevated CRP and sed rate, hilar adenopathy,                            rule out Crohn's colitis/perianal Crohn's versus                            sarcoidosis Medicines:                Monitored Anesthesia Care Procedure:                Pre-Anesthesia Assessment:                           - Prior to the procedure, a History and Physical                            was performed, and patient medications and                            allergies were reviewed. The patient's tolerance of                            previous anesthesia was also reviewed. The risks                            and benefits of the procedure and the sedation                            options and risks were discussed with the patient.                            All questions were answered, and informed consent  was obtained. Prior Anticoagulants: The patient has                            taken no previous anticoagulant or antiplatelet                            agents. ASA Grade Assessment: II - A patient with                            mild systemic disease. After reviewing the risks                            and benefits, the patient was deemed in                            satisfactory condition to undergo the procedure.                          After obtaining informed consent, the colonoscope                            was passed under direct vision. Throughout the                            procedure, the patient's blood pressure, pulse, and                            oxygen saturations were monitored continuously. The                            Model PCF-H190DL [id number]) scope was introduced                            through the anus and advanced to the terminal                            ileum. The colonoscopy was performed without                            difficulty. The patient tolerated the procedure                            well. The quality of the bowel preparation was                            good. The terminal ileum, ileocecal valve,                            appendiceal orifice, and rectum were photographed. Scope In: 2:22:31 PM Scope Out: 2:39:29 PM Scope Withdrawal Time: 0 hours 14 minutes 45 seconds  Total Procedure Duration: 0 hours 16 minutes 58 seconds  Findings:                 The digital rectal exam findings include palpable  anal lesions which are firm and extend into the                            distal rectum.                           The terminal ileum appeared normal.                           Localized mild, very small patch of mucosal changes                            characterized by erythema were found in the cecum.                            Biopsies were taken with a cold forceps for                            histology.                           Normal mucosa was found in the descending colon, in                            the transverse colon and in the ascending colon.                            This was biopsied with a cold forceps for histology.                           Diffuse severe inflammation characterized by                            adherent blood, congestion (edema), erythema,                            friability and  granularity was found in the rectum,                            in the recto-sigmoid colon, in the mid sigmoid                            colon and in the distal sigmoid colon. The                            inflammation in the sigmoid is very mild, becoming                            more severe as you approach the distal rectum.                            Biopsies were taken with a cold forceps for  histology.                           Retroflexion in the rectum was not performed due to                            anatomy/severe inflammation. Complications:            No immediate complications. Estimated Blood Loss:     Estimated blood loss was minimal. Impression:               - No specimens collected. Recommendation:           - Patient has a contact number available for                            emergencies. The signs and symptoms of potential                            delayed complications were discussed with the                            patient. Return to normal activities tomorrow.                            Written discharge instructions were provided to the                            patient.                           - Resume previous diet.                           - Continue present medications.                           - Would use MiraLax 17 g daily to avoid hard                            stools/constipation.                           - Begin prednisone 40 mg daily (findings certainly                            suspicious for Crohn's disease though with                            noncaseating granulomas and hilar adenopathy need                            to exclude sarcoidosis; prednisone should treat                            both). She has pulmonary appointment with Dr.  McQuaid on 02/09/2018. Would continue prednisone 40                            mg daily until seeing Dr. Lake Bells and then we will                             discuss tapering.                           - Await pathology results.                           - Repeat colonoscopy is recommended. The                            colonoscopy date will be determined after pathology                            results from today's exam become available for                            review.                           - Office follow-up with me next available. Jerene Bears, MD 01/18/2018 2:58:26 PM This report has been signed electronically.

## 2018-01-18 NOTE — Progress Notes (Signed)
Called to room to assist during endoscopic procedure.  Patient ID and intended procedure confirmed with present staff. Received instructions for my participation in the procedure from the performing physician.  

## 2018-01-19 ENCOUNTER — Telehealth: Payer: Self-pay | Admitting: *Deleted

## 2018-01-19 NOTE — Telephone Encounter (Signed)
  Follow up Call-  Call back number 01/18/2018  Post procedure Call Back phone  # [phone]  Permission to leave phone message Yes  Some recent data might be hidden     Patient questions:  Do you have a fever, pain , or abdominal swelling? No. Pain Score  0 *  Have you tolerated food without any problems? Yes.    Have you been able to return to your normal activities? Yes.    Do you have any questions about your discharge instructions: Diet   No. Medications  No. Follow up visit  No.  Do you have questions or concerns about your Care? No.  Actions: * If pain score is 4 or above: No action needed, pain <4.

## 2018-02-09 ENCOUNTER — Encounter: Payer: Self-pay | Admitting: Pulmonary Disease

## 2018-02-09 ENCOUNTER — Ambulatory Visit (INDEPENDENT_AMBULATORY_CARE_PROVIDER_SITE_OTHER): Payer: Self-pay | Admitting: Pulmonary Disease

## 2018-02-09 ENCOUNTER — Other Ambulatory Visit: Payer: Self-pay

## 2018-02-09 VITALS — BP 148/98 | HR 78 | Ht <= 58 in | Wt 213.0 lb

## 2018-02-09 DIAGNOSIS — B399 Histoplasmosis, unspecified: Secondary | ICD-10-CM

## 2018-02-09 DIAGNOSIS — Z23 Encounter for immunization: Secondary | ICD-10-CM

## 2018-02-09 DIAGNOSIS — K635 Polyp of colon: Secondary | ICD-10-CM

## 2018-02-09 DIAGNOSIS — J45909 Unspecified asthma, uncomplicated: Secondary | ICD-10-CM

## 2018-02-09 DIAGNOSIS — D869 Sarcoidosis, unspecified: Secondary | ICD-10-CM

## 2018-02-09 NOTE — Patient Instructions (Signed)
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  However in the meantime I would like to check a urine test for a fungus called histoplasmosis as this can sometimes cause this problem.  I would also like to check a blood test to look for evidence of tuberculosis. Continue taking prednisone as directed by Dr. Hilarie Fredrickson for now  Asthma: Practice good hand hygiene Stay active Continue using albuterol as needed for chest tightness wheezing or shortness of breath We will review the lung function testing with you Let us know if you develop worsening shortness of breath  Deconditioning/shortness of breath: I think you need to start exercising regularly  We will see you back in 2 to 3 weeks to go over these results or sooner if needed

## 2018-02-09 NOTE — Progress Notes (Signed)
Synopsis: Referred in October 2019 for possible sarcoidosis.  She developed painful perianal nodules in 2019 and on biopsy was noted to have non-caseating granulomatous inflammation.   Subjective:   PATIENT ID: Kathy [name] GENDER: female DOB: Sep 16, 1955, MRN: [id number]   HPI  Chief Complaint  Patient presents with  . Consult    States she had abnormal CXR and was referred to [place]. Says they think she may have Sarcoidosis.     Aldine is here to see me today for a possible diagnosis of sarcoidosis.  She says that she has never smoked cigarettes and most of her life she has worked in [employer] though she has worked in some factories from time to time.  She says she has never worked in a prison or jail type environment.  She says that she grew up in [place] near [place] and has spent most of her life there.  She has lived off and on in the [place] area.  She says that her father died of asthma and respiratory related problems, she is not sure if he smoked cigarettes.  She says that she remembers having severe asthma as a child and spending time in an oxygen tent in a hospital.  She also recalls times where she has had to come to [facility] for evaluation of shortness of breath which she always attributed to asthma.  She says that she will get shortness of breath which is typically worse in the fall.  She says in addition to this though she has noticed increasing exertional dyspnea and fatigue for the last year.  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  Eventually she had to quit the job because of fatigue and shortness of breath with exertion.  She does not have problems with cough.  In 2019 she developed severe anal pain with defecation.  She was evaluated by Dr. Dema Severin and found to have a new finding of colonic and rectal  polyps.  These were biopsied and showed granulomatous inflammation, noncaseating.  She was then referred to gastroenterology and she underwent an upper endoscopy and a colonoscopy.  This showed findings of a lymphocytic inflammation in the colon but no other diagnostic findings.  She says that she was started on prednisone by Dr. Hilarie Fredrickson and the pain in her rectum has improved.  She still struggles with fatigue on a day-to-day basis.  Past Medical History:  Diagnosis Date  . Anal pain   . Asthma    09-23-2017  per pt has not done inhaler since May 2018, no insurance (QVAR bid and Ventolin prn)  . Diastolic dysfunction    per echo [id number]  grade 2 diastolic dysfunction , ef 60-65%  . GERD (gastroesophageal reflux disease)   . Hemorrhoids   . Hyperlipidemia    stopped meds due to side effects  . Hypertension    09-23-2017  per pt has takes bp meds since May 2018 no insurance (losartan 19m qd, HCTZ 12.566mqd, and toprol 50 qd)  . Type 2 diabetes mellitus ([facility]   09-23-2017 per pt has not taken diabetic meds since May 2018, no insurance ([id number])     Family History  Problem Relation Age of Onset  . Hypertension Mother 70 54 Stroke Mother 70 57 Diabetes Maternal Aunt  s/p bilater amputation due to DM  . Asthma Father   . Colon cancer Neg Hx   . Esophageal cancer Neg Hx   . Stomach cancer Neg Hx   . Rectal cancer Neg Hx      Social History   Socioeconomic History  . Marital status: Married    Spouse name: Not on file  . Number of children: 2  . Years of education: Not on file  . Highest education level: Not on file  Occupational History  . Not on file  Social Needs  . Financial resource strain: Not on file  . Food insecurity:    Worry: Not on file    Inability: Not on file  . Transportation needs:    Medical: Not on file    Non-medical: Not on file  Tobacco Use  . Smoking status: Never Smoker  . Smokeless tobacco: Never Used  Substance and  Sexual Activity  . Alcohol use: No    Frequency: Never  . Drug use: No  . Sexual activity: Not on file  Lifestyle  . Physical activity:    Days per week: Not on file    Minutes per session: Not on file  . Stress: Not on file  Relationships  . Social connections:    Talks on phone: Not on file    Gets together: Not on file    Attends religious service: Not on file    Active member of club or organization: Not on file    Attends meetings of clubs or organizations: Not on file    Relationship status: Not on file  . Intimate partner violence:    Fear of current or ex partner: Not on file    Emotionally abused: Not on file    Physically abused: Not on file    Forced sexual activity: Not on file  Other Topics Concern  . Not on file  Social History Narrative  . Not on file     Allergies  Allergen Reactions  . Lipitor [Atorvastatin Calcium]     Leg cramps   . Lisinopril Itching  . Pravachol [Pravastatin Sodium]   . Pravastatin Sodium Other (See Comments)    leg cramps     Outpatient Medications Prior to Visit  Medication Sig Dispense Refill  . amLODipine (NORVASC) 5 MG tablet Take 1 tablet (5 mg total) by mouth daily. 90 tablet 0  . glipiZIDE (GLUCOTROL) 5 MG tablet Take 1 tablet (5 mg total) by mouth daily before breakfast. 90 tablet 0  . hydrochlorothiazide (MICROZIDE) 12.5 MG capsule Take 1 capsule (12.5 mg total) by mouth daily. For blood pressure. 90 capsule 0  . metoprolol succinate (TOPROL-XL) 50 MG 24 hr tablet Take 1 tablet (50 mg total) by mouth daily. 90 tablet 0  . omeprazole (PRILOSEC) 40 MG capsule Take 1 capsule (40 mg total) by mouth daily. 90 capsule 3  . predniSONE (DELTASONE) 10 MG tablet Take 4 tablets (40 mg total) by mouth daily with breakfast. 120 tablet 0  . simvastatin (ZOCOR) 10 MG tablet Take 1 tablet (10 mg total) by mouth at bedtime. 90 tablet 0   Facility-Administered Medications Prior to Visit  Medication Dose Route Frequency Provider Last  Rate Last Dose  . 0.9 %  sodium chloride infusion  500 mL Intravenous Once Pyrtle, [name], MD        Review of Systems  Constitutional: Negative.  Negative for chills, fever, malaise/fatigue and weight loss.  HENT: Negative.  Negative for congestion,  nosebleeds, sinus pain and sore throat.   Eyes: Negative.  Negative for photophobia, pain and discharge.  Respiratory: Positive for shortness of breath. Negative for cough, hemoptysis, sputum production and wheezing.   Cardiovascular: Positive for chest pain and leg swelling. Negative for palpitations, orthopnea, claudication and PND.  Gastrointestinal: Positive for heartburn. Negative for abdominal pain, blood in stool, constipation, diarrhea, melena, nausea and vomiting.  Genitourinary: Negative.  Negative for dysuria, frequency, hematuria and urgency.  Musculoskeletal: Negative.  Negative for back pain, joint pain, myalgias and neck pain.  Skin: Negative.  Negative for itching and rash.  Neurological: Negative.  Negative for tingling, tremors, sensory change, speech change, focal weakness, seizures, weakness and headaches.  Endo/Heme/Allergies: Negative.   Psychiatric/Behavioral: Negative.  Negative for memory loss, substance abuse and suicidal ideas. The patient is not nervous/anxious.       Objective:  Physical Exam   Vitals:   02/09/18 0955  BP: (!) 148/98  Pulse: 78  SpO2: 99%  Weight: 213 lb (96.6 kg)  Height: 4' 8.4" (1.433 m)   RA   Gen: well appearing, no acute distress HENT: NCAT, OP clear, neck supple without masses Eyes: PERRL, EOMi Lymph: no cervical lymphadenopathy PULM: CTA B CV: RRR, no mgr, no JVD GI: BS+, soft, nontender, no hsm Derm: no rash or skin breakdown MSK: normal bulk and tone Neuro: A&Ox4, CN II-XII intact, strength 5/5 in all 4 extremities Psyche: normal mood and affect   CBC    Component Value Date/Time   WBC 8.1 01/10/2018 1009   RBC 5.24 (H) 01/10/2018 1009   HGB 11.5 (L) 01/10/2018  1009   HCT 36.6 01/10/2018 1009   PLT 378.0 01/10/2018 1009   MCV 69.8 Repeated and verified X2. (L) 01/10/2018 1009   MCH 22.6 (L) 10/03/2017 0649   MCHC 31.4 01/10/2018 1009   RDW 15.1 01/10/2018 1009   LYMPHSABS 1.6 01/10/2018 1009   MONOABS 0.7 01/10/2018 1009   EOSABS 0.3 01/10/2018 1009   BASOSABS 0.0 01/10/2018 1009     Chest imaging: 12/2017 CXR > personally reviewed, normal pulmoanry parenchyma, calcified hilar lymph nodes 10/219 CT abdomen/pelvis> lung windows personally reviewed showing normal pulmonary parenchyma; nonspecific mod circumferential wall thickening throughout mid to lower rectum, could be IBD or neoplasm, no evidence of malignancy, no adenopathy  PFT:  Labs: 12/2017 ANCA neg, CRP elevated, ACE 45, microcytic anemia noted  Path: 09/2017 perianal nodules: granulomatous inflammation 12/2017 gastric antrum: chronic inflammation and focal fibrosis, neg h. Pylori, no malignancy 12/2017 colon biopsies (multiple): mildly increased intraepithelial lymphocytes: microscopic colitis, though some focal neutrophilic inflammation noted, not characteristic of this finding  Echo:  Heart Catheterization:   Records from her visit with general surgery and gastroenterology reviewed.  Summarized above.  She ended up having a biopsy twice from her anal area.  The initial nodule showed noncaseating granulomatous inflammation.  Special stains were not performed.  She has been started on prednisone for possible sarcoid versus Crohn's disease     Assessment & Plan:   Histoplasmosis - Plan: QuantiFERON-TB Gold Plus, Histoplasma Antigen, Urine, CANCELED: Histoplasma Antigen, Urine, CANCELED: QuantiFERON-TB Gold Plus  Sarcoidosis - Plan: CT Chest High Resolution, Pulmonary function test  Polyp of colon, unspecified part of colon, unspecified type  Uncomplicated asthma, unspecified asthma severity, unspecified whether persistent  Discussion: Laterria comes to see me today for a  possible diagnosis of sarcoidosis.  She has a unique presentation and that the only active inflammation which has been identified it has been in her  anus and rectum.  This is quite unusual for sarcoidosis though she does have evidence of calcified lymph nodes on a chest x-ray which is suggestive of a prior diagnosis of sarcoid.  I think the differential diagnosis includes sarcoidosis versus less likely Crohn's disease or even less likely an occult infection like histoplasmosis or TB as these can manifest with colonic manifestations and cause calcified lymph nodes.  She has lived in an environment where she would be exposed to histoplasmosis.  I think the best approach moving forward is to rule out evidence of active histoplasmosis and tuberculosis and if those tests are negative she should be treated with a biologic agent for sarcoidosis.  There are reports in the literature of patients with anal sarcoidosis who have done quite well with Humira.  Her shortness of breath is likely multifactorial due to obesity, deconditioning, and asthma.  I doubt there is evidence of pulmonary parenchymal disease but I think it would be helpful to have a high-resolution CT scan of her chest and a lung function test.  Plan: Possible sarcoidosis: We will check a high-resolution CT scan of your chest We will check a lung function test  Granulomatous inflammation seen on colon biopsy: I think this is probably due to sarcoidosis and ultimately need to be treated with Humira.  However in the meantime I would like to check a urine test for a fungus called histoplasmosis as this can sometimes cause this problem.  I would also like to check a blood test to look for evidence of tuberculosis. Continue taking prednisone as directed by Dr. Hilarie Fredrickson for now  Asthma: Practice good hand hygiene Stay active Continue using albuterol as needed for chest tightness wheezing or shortness of breath We will review the lung function  testing with you Let us know if you develop worsening shortness of breath  Deconditioning/shortness of breath: I think you need to start exercising regularly  We will see you back in 2 to 3 weeks to go over these results or sooner if needed  Greater than 50% of this 60-minute visit spent face-to-face     Current Outpatient Medications:  .  amLODipine (NORVASC) 5 MG tablet, Take 1 tablet (5 mg total) by mouth daily., Disp: 90 tablet, Rfl: 0 .  glipiZIDE (GLUCOTROL) 5 MG tablet, Take 1 tablet (5 mg total) by mouth daily before breakfast., Disp: 90 tablet, Rfl: 0 .  hydrochlorothiazide (MICROZIDE) 12.5 MG capsule, Take 1 capsule (12.5 mg total) by mouth daily. For blood pressure., Disp: 90 capsule, Rfl: 0 .  metoprolol succinate (TOPROL-XL) 50 MG 24 hr tablet, Take 1 tablet (50 mg total) by mouth daily., Disp: 90 tablet, Rfl: 0 .  omeprazole (PRILOSEC) 40 MG capsule, Take 1 capsule (40 mg total) by mouth daily., Disp: 90 capsule, Rfl: 3 .  predniSONE (DELTASONE) 10 MG tablet, Take 4 tablets (40 mg total) by mouth daily with breakfast., Disp: 120 tablet, Rfl: 0 .  simvastatin (ZOCOR) 10 MG tablet, Take 1 tablet (10 mg total) by mouth at bedtime., Disp: 90 tablet, Rfl: 0  Current Facility-Administered Medications:  .  0.9 %  sodium chloride infusion, 500 mL, Intravenous, Once, Pyrtle, [name], MD

## 2018-02-13 ENCOUNTER — Ambulatory Visit (INDEPENDENT_AMBULATORY_CARE_PROVIDER_SITE_OTHER): Payer: Self-pay | Admitting: Pulmonary Disease

## 2018-02-13 DIAGNOSIS — D869 Sarcoidosis, unspecified: Secondary | ICD-10-CM

## 2018-02-13 LAB — PULMONARY FUNCTION TEST
DL/VA % pred: 146 %
DL/VA: 5.98 ml/min/mmHg/L
DLCO cor % pred: 109 %
DLCO cor: 19.19 ml/min/mmHg
DLCO unc % pred: 102 %
DLCO unc: 17.97 ml/min/mmHg
FEF 25-75 POST: 1.78 L/s
FEF 25-75 Pre: 1.29 L/sec
FEF2575-%Change-Post: 38 %
FEF2575-%PRED-POST: 112 %
FEF2575-%Pred-Pre: 81 %
FEV1-%Change-Post: 7 %
FEV1-%Pred-Post: 101 %
FEV1-%Pred-Pre: 94 %
FEV1-PRE: 1.47 L
FEV1-Post: 1.59 L
FEV1FVC-%CHANGE-POST: 0 %
FEV1FVC-%PRED-PRE: 100 %
FEV6-%Change-Post: 5 %
FEV6-%Pred-Post: 102 %
FEV6-%Pred-Pre: 96 %
FEV6-POST: 1.97 L
FEV6-Pre: 1.86 L
FEV6FVC-%PRED-POST: 104 %
FEV6FVC-%Pred-Pre: 104 %
FVC-%CHANGE-POST: 7 %
FVC-%PRED-PRE: 92 %
FVC-%Pred-Post: 98 %
FVC-POST: 1.99 L
FVC-PRE: 1.86 L
PRE FEV1/FVC RATIO: 79 %
Post FEV1/FVC ratio: 80 %
Post FEV6/FVC ratio: 100 %
Pre FEV6/FVC Ratio: 100 %

## 2018-02-13 LAB — QUANTIFERON-TB GOLD PLUS
Mitogen-NIL: >10 IU/mL
NIL: 0.04 [IU]/mL
QuantiFERON-TB Gold Plus: NEGATIVE
TB1-NIL: 0.01 IU/mL
TB2-NIL: 0.01 IU/mL

## 2018-02-13 LAB — HISTOPLASMA ANTIGEN, URINE

## 2018-02-13 NOTE — Progress Notes (Signed)
PFT done today. 

## 2018-02-20 ENCOUNTER — Encounter: Payer: Self-pay | Admitting: *Deleted

## 2018-02-21 ENCOUNTER — Other Ambulatory Visit: Payer: Self-pay

## 2018-02-21 DIAGNOSIS — K6289 Other specified diseases of anus and rectum: Secondary | ICD-10-CM

## 2018-02-21 MED ORDER — PREDNISONE 20 MG PO TABS: 30.0000 mg | ORAL_TABLET | Freq: Every day | ORAL | 0 refills | Status: DC

## 2018-02-22 ENCOUNTER — Ambulatory Visit (INDEPENDENT_AMBULATORY_CARE_PROVIDER_SITE_OTHER)
Admission: RE | Admit: 2018-02-22 | Discharge: 2018-02-22 | Disposition: A | Discharge status: Home / Self Care | Payer: Self-pay | Source: Ambulatory Visit | Attending: Pulmonary Disease | Admitting: Pulmonary Disease

## 2018-02-22 DIAGNOSIS — D869 Sarcoidosis, unspecified: Secondary | ICD-10-CM

## 2018-03-01 ENCOUNTER — Ambulatory Visit (HOSPITAL_COMMUNITY)
Admission: RE | Admit: 2018-03-01 | Discharge: 2018-03-01 | Disposition: A | Discharge status: Home / Self Care | Payer: Self-pay | Source: Ambulatory Visit | Attending: Internal Medicine | Admitting: Internal Medicine

## 2018-03-01 DIAGNOSIS — D259 Leiomyoma of uterus, unspecified: Secondary | ICD-10-CM | POA: Insufficient documentation

## 2018-03-01 DIAGNOSIS — K6289 Other specified diseases of anus and rectum: Secondary | ICD-10-CM | POA: Insufficient documentation

## 2018-03-01 LAB — POCT I-STAT CREATININE: Creatinine, Ser: 1.2 mg/dL — ABNORMAL HIGH (ref 0.44–1.00)

## 2018-03-01 MED ORDER — GADOBUTROL 1 MMOL/ML IV SOLN
9.0000 mL | Freq: Once | INTRAVENOUS | Status: AC | PRN
  Administered 2018-03-01: 9 mL via INTRAVENOUS

## 2018-03-02 ENCOUNTER — Encounter: Payer: Self-pay | Admitting: Internal Medicine

## 2018-03-02 ENCOUNTER — Other Ambulatory Visit (INDEPENDENT_AMBULATORY_CARE_PROVIDER_SITE_OTHER): Payer: Self-pay

## 2018-03-02 ENCOUNTER — Ambulatory Visit (INDEPENDENT_AMBULATORY_CARE_PROVIDER_SITE_OTHER): Payer: Self-pay | Admitting: Internal Medicine

## 2018-03-02 VITALS — BP 144/80 | HR 72 | Ht <= 58 in | Wt 218.0 lb

## 2018-03-02 DIAGNOSIS — K5010 Crohn's disease of large intestine without complications: Secondary | ICD-10-CM

## 2018-03-02 DIAGNOSIS — K50113 Crohn's disease of large intestine with fistula: Secondary | ICD-10-CM

## 2018-03-02 DIAGNOSIS — K2100 Gastro-esophageal reflux disease with esophagitis, without bleeding: Secondary | ICD-10-CM

## 2018-03-02 DIAGNOSIS — K2950 Unspecified chronic gastritis without bleeding: Secondary | ICD-10-CM

## 2018-03-02 LAB — CBC WITH DIFFERENTIAL/PLATELET
BASOS ABS: 0 10*3/uL (ref 0.0–0.1)
BASOS PCT: 0.5 % (ref 0.0–3.0)
EOS PCT: 0.2 % (ref 0.0–5.0)
Eosinophils Absolute: 0 10*3/uL (ref 0.0–0.7)
HCT: 39 % (ref 36.0–46.0)
Hemoglobin: 12.3 g/dL (ref 12.0–15.0)
LYMPHS ABS: 2.2 10*3/uL (ref 0.7–4.0)
Lymphocytes Relative: 27.1 % (ref 12.0–46.0)
MCHC: 31.5 g/dL (ref 30.0–36.0)
MCV: 70.3 fl — ABNORMAL LOW (ref 78.0–100.0)
MONO ABS: 0.6 10*3/uL (ref 0.1–1.0)
MONOS PCT: 7.6 % (ref 3.0–12.0)
Neutro Abs: 5.2 10*3/uL (ref 1.4–7.7)
Neutrophils Relative %: 64.6 % (ref 43.0–77.0)
Platelets: 320 10*3/uL (ref 150.0–400.0)
RBC: 5.55 Mil/uL — AB (ref 3.87–5.11)
RDW: 18.4 % — ABNORMAL HIGH (ref 11.5–15.5)
WBC: 8.1 10*3/uL (ref 4.0–10.5)

## 2018-03-02 LAB — COMPREHENSIVE METABOLIC PANEL
ALK PHOS: 111 U/L (ref 39–117)
ALT: 17 U/L (ref 0–35)
AST: 6 U/L (ref 0–37)
Albumin: 3.8 g/dL (ref 3.5–5.2)
BUN: 25 mg/dL — AB (ref 6–23)
CHLORIDE: 98 meq/L (ref 96–112)
CO2: 33 meq/L — AB (ref 19–32)
Calcium: 9.9 mg/dL (ref 8.4–10.5)
Creatinine, Ser: 1.06 mg/dL (ref 0.40–1.20)
GFR: 66.99 mL/min (ref >60.00)
GLUCOSE: 267 mg/dL — AB (ref 70–99)
POTASSIUM: 4.4 meq/L (ref 3.5–5.1)
SODIUM: 137 meq/L (ref 135–145)
TOTAL PROTEIN: 7.2 g/dL (ref 6.0–8.3)
Total Bilirubin: 0.3 mg/dL (ref 0.2–1.2)

## 2018-03-02 LAB — HIGH SENSITIVITY CRP: CRP HIGH SENSITIVITY: 19.13 mg/L — AB (ref 0.000–5.000)

## 2018-03-02 LAB — SEDIMENTATION RATE

## 2018-03-02 MED ORDER — DICYCLOMINE HCL 10 MG PO CAPS: 10.0000 mg | ORAL_CAPSULE | Freq: Three times a day (TID) | ORAL | 2 refills | Status: DC | PRN

## 2018-03-02 NOTE — Patient Instructions (Addendum)
Continue Prednisone 30 mg daily x 7 more days, then decrease to 20 mg daily x 14 days, the decrease further to 10 mg daily x additional 30 days (by this time, you will be seeing [place] for further instruction).  Please fill out the paperwork we have given you regarding Humira and bring it back as quickly as possible so we can get things initiated for you.   Your provider has requested that you go to the basement level for lab work before leaving today. Press "B" on the elevator. The lab is located at the first door on the left as you exit the elevator.  We have sent the following medications to your pharmacy for you to pick up at your convenience: Bentyl 10 mg three times daily as needed for cramping   You have been scheduled for a follow up with Dr Hilarie Fredrickson on April 28, 2018 at 31 am.  If you are age 83 or older, your body mass index should be between 23-30. Your Body mass index is 45.56 kg/m. If this is out of the aforementioned range listed, please consider follow up with your Primary Care Provider.  If you are age 77 or younger, your body mass index should be between 19-25. Your Body mass index is 45.56 kg/m. If this is out of the aformentioned range listed, please consider follow up with your Primary Care Provider.

## 2018-03-02 NOTE — Progress Notes (Signed)
Subjective:    Patient ID: [name], female    DOB: June 28, 1955, 62 y.o.   MRN: [id number]  HPI Cortasia Screws is a 62 year old female with a history of colitis/proctitis with perianal involvement, hypertension, hyperlipidemia, diabetes, asthma who is seen in follow-up.  I initially saw her on 01/10/2018 to evaluate her perianal and rectal pain along with the previously biopsied granulomatous inflammation in the perianal tissue.  She then came for colonoscopy on 01/18/2018.  Colonoscopy revealed palpable anal lesions which were firm extending into the distal rectum from the anal canal, normal terminal ileum, very small patch of mucosal erythema in the cecum which was biopsied, normal mucosa from the ascending colon to descending colon.  Severe diffuse inflammation characterized by adherent blood, erythema, friability in the rectum, rectosigmoid and sigmoid to the mid sigmoid.  The sigmoid inflammation was mild compared to the rectal inflammation.  This was biopsied extensively.  Upper endoscopy performed on the same day showed mild esophagitis, 2 cm hiatal hernia and was otherwise normal.  Pathology results from her studies = chronic inflammation and focal fibrosis in the body and antrum.  No H. pylori, metaplasia or dysplasia.  Cecal and ascending colon = mildly increased intraepithelial lymphocytes.  Descending and transverse colon = colitis with increased intraepithelial lymphocytes.  Sigmoid colon = colitis and increased intraepithelial lymphocytes.  Rectum = colitis with increased intraepithelial lymphocytes. --The microscopic comment said there was moderate to markedly increased infiltrate in the lamina propria with foci with neutrophils.  There was intraepithelial lymphocytosis and mild crypt architecture distortion.  It was felt this may be lymphocytic colitis but that the neutrophils in the lamina propria are unusual and lymphocytic colitis.  After her visit she had a CT scan  of the abdomen and pelvis which showed nonspecific moderate circumferential wall thickening throughout the mid to lower rectum with hyperenhancement and haziness of the perirectal fat.  No abscess or free air.  Moderate to large colonic stool volume.  No adenopathy.  She then saw Dr. Lake Bells to rule out sarcoidosis.  He has excluded sarcoidosis and histoplasmosis.  Her ACE level was negative.  Her histoplasmosis testing was negative.  A high-resolution CT of her chest showed no evidence of interstitial lung disease or sarcoid.  I then ordered an MR pelvis which was performed yesterday.  MR pelvis showed edema and mucosal hyperenhancement in the rectum and anus most consistent with proctitis.  There was a subtle fistulous track from the 5 o'clock position of the anus to the intergluteal cleft.  Trans-sphincteric.  No abscess.  Since her colonoscopy I have had her on prednisone as we have worked on diagnosis and with consultation by Dr. Lake Bells.  She took prednisone 40 mg for 1 month and then I reduced to 30 mg which she is taking currently.  Her perianal and rectal pain is definitively better but also very much still present.  She has had several days with diarrhea occurring 4-6 times per day which is nonbloody but it can be urgent and has led to an accident.  On other days bowel movements are more normal for her.  She can occasionally see blood with wiping.  This seems less than before.  She has diffuse abdominal cramping which gets worse after she eats.  She is taking the omeprazole 40 mg daily which was started after her endoscopy showing mild esophagitis.  With the prednisone she seen disruption her sleep but also considerable fatigue and muscle weakness.  She was feeling fatigue before  prednisone but she feels it is worse.   Review of Systems As per HPI, otherwise negative  Current Medications, Allergies, Past Medical History, Past Surgical History, Family History and Social History were reviewed in  [employer] record.     Objective:   Physical Exam BP (!) 144/80 Comment: not taken meds today  Pulse 72   Ht 4' 10" (1.473 m)   Wt 218 lb (98.9 kg)   BMI 45.56 kg/m  Constitutional: Well-developed and well-nourished. No distress. HEENT: Normocephalic and atraumatic. Oropharynx is clear and moist. Conjunctivae are normal.  No scleral icterus. Neck: Neck supple. Trachea midline. Cardiovascular: Normal rate, regular rhythm and intact distal pulses. No M/R/G Pulmonary/chest: Effort normal and breath sounds normal. No wheezing, rales or rhonchi. Abdominal: Soft, highly tender without rebound or guarding, nondistended. Bowel sounds active throughout. There are no masses palpable. No hepatosplenomegaly. Rectal: Skin cracking in the intergluteal cleft superiorly, perianal firmness and swelling though improved compared to rectal exam at time of colonoscopy, less though present perianal tenderness, internal rectal exam not performed today Extremities: no clubbing, cyanosis, or edema Neurological: Alert and oriented to person place and time. Skin: Skin is warm and dry. Psychiatric: Normal mood and affect. Behavior is normal.  ACE level was normal ESR 130 CRP 58.9 IBD panel negative    Assessment & Plan:  62 year old female with a history of colitis/proctitis with perianal involvement, hypertension, hyperlipidemia, diabetes, asthma who is seen in follow-up.  1.  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  I was suspicious for sarcoid but after pulmonary evaluation, negative ACE level I feel that Crohn's is more likely than sarcoid.  She did have granulomatous inflammation at the time of biopsy with Dr. Dema Severin in the summer 2019.  We reviewed the pathology results together today and while not absolutely consistent with Crohn's, this certainly is not microscopic/lymphocytic  colitis.  There is macroscopic inflammation, fistula, neither of which are consistent with microscopic colitis.  Prednisone has definitively been helpful but is not a long-term solution and is causing muscle weakness, worsening fatigue and also interfering with sleep.  I recommended that we start Humira therapy for her Crohn's disease.    We also discussed the risks in great detail including the risk of infection (including reactivation of latent TB and underlying viral hepatitis), hepatotoxicity, rash, leukopenia,  malignancy (specifically lymphoma), demyelinating disease, and even heart failure.  This discussion she is agreeable and wishes to proceed. --We will apply for drug assistance until she has Medicare next year  Continue prednisone 30 mg x 7 more days then reduce to 20 mg x 2 weeks then 10 mg x 4 weeks Start Bentyl 10 mg 3 times daily as needed for crampy abdominal pain Return to clinic in about a month Call me if worsening his prednisone tapers We will start Humira as soon as possible  2.  GERD and gastritis --esophagitis and gastritis on recent endoscopy.  Continue omeprazole 40 mg daily.  Repeat CBC, CMP sed rate and CRP today

## 2018-03-06 ENCOUNTER — Other Ambulatory Visit: Payer: Self-pay

## 2018-03-06 DIAGNOSIS — K50918 Crohn's disease, unspecified, with other complication: Secondary | ICD-10-CM

## 2018-03-06 MED ORDER — METHOTREXATE SODIUM 15 MG PO TABS: 15.0000 mg | ORAL_TABLET | ORAL | 6 refills | Status: DC

## 2018-03-08 ENCOUNTER — Ambulatory Visit: Payer: Self-pay | Admitting: Internal Medicine

## 2018-03-08 ENCOUNTER — Other Ambulatory Visit: Payer: Self-pay

## 2018-03-08 DIAGNOSIS — K449 Diaphragmatic hernia without obstruction or gangrene: Secondary | ICD-10-CM

## 2018-03-08 DIAGNOSIS — K210 Gastro-esophageal reflux disease with esophagitis: Secondary | ICD-10-CM

## 2018-03-08 DIAGNOSIS — E1159 Type 2 diabetes mellitus with other circulatory complications: Secondary | ICD-10-CM

## 2018-03-08 DIAGNOSIS — Z7984 Long term (current) use of oral hypoglycemic drugs: Secondary | ICD-10-CM

## 2018-03-08 DIAGNOSIS — B373 Candidiasis of vulva and vagina: Secondary | ICD-10-CM

## 2018-03-08 DIAGNOSIS — E119 Type 2 diabetes mellitus without complications: Secondary | ICD-10-CM

## 2018-03-08 DIAGNOSIS — I10 Essential (primary) hypertension: Secondary | ICD-10-CM

## 2018-03-08 DIAGNOSIS — Z79899 Other long term (current) drug therapy: Secondary | ICD-10-CM

## 2018-03-08 DIAGNOSIS — Z7952 Long term (current) use of systemic steroids: Secondary | ICD-10-CM

## 2018-03-08 DIAGNOSIS — I5189 Other ill-defined heart diseases: Secondary | ICD-10-CM

## 2018-03-08 DIAGNOSIS — B379 Candidiasis, unspecified: Secondary | ICD-10-CM

## 2018-03-08 DIAGNOSIS — I119 Hypertensive heart disease without heart failure: Secondary | ICD-10-CM

## 2018-03-08 DIAGNOSIS — I152 Hypertension secondary to endocrine disorders: Secondary | ICD-10-CM

## 2018-03-08 DIAGNOSIS — K50919 Crohn's disease, unspecified, with unspecified complications: Secondary | ICD-10-CM

## 2018-03-08 DIAGNOSIS — K5090 Crohn's disease, unspecified, without complications: Secondary | ICD-10-CM

## 2018-03-08 DIAGNOSIS — K219 Gastro-esophageal reflux disease without esophagitis: Secondary | ICD-10-CM

## 2018-03-08 MED ORDER — AMLODIPINE BESYLATE 10 MG PO TABS: 10.0000 mg | ORAL_TABLET | Freq: Every day | ORAL | 3 refills | Status: DC

## 2018-03-08 MED ORDER — FLUCONAZOLE 150 MG PO TABS: 150.0000 mg | ORAL_TABLET | Freq: Once | ORAL | 0 refills | Status: AC

## 2018-03-08 NOTE — Progress Notes (Signed)
CC: establishing care   HPI:  [name] is a 62 y.o. with PMH as listed below who presents to establish care. Please see the assessment and plans for the status of the patient chronic medical problems.   Past Medical History:  Diagnosis Date  . Asthma    09-23-2017  per pt has not done inhaler since May 2018, no insurance (QVAR bid and Ventolin prn)  . Diastolic dysfunction    per echo [id number]  grade 2 diastolic dysfunction , ef 60-65%  . Esophagitis   . GERD (gastroesophageal reflux disease)   . Hemorrhoids   . Hiatal hernia   . Hyperlipidemia    stopped meds due to side effects  . Hypertension    09-23-2017  per pt has takes bp meds since May 2018 no insurance (losartan 124m qd, HCTZ 12.564mqd, and toprol 50 qd)  . Type 2 diabetes mellitus (HCDaviess   09-23-2017 per pt has not taken diabetic meds since May 2018, no insurance (kombiglyze [id number])   Review of Systems:  Refer to history of present illness and assessment and plans for pertinent review of systems, all others reviewed and negative   Past Surgical History:  Procedure Laterality Date  . CARDIOVASCULAR STRESS TEST  07/29/2009   normal nuclear study w/ no ischemia/  normal LV function and wall motion,  ef 70%  . CESAREAN SECTION  1980s  . RECTAL BIOPSY N/A 10/03/2017   Procedure: POSSIBLE BIOPSY RECTAL;  Surgeon: WhiIleana RoupD;  Location: [facility];  Service: General;  Laterality: N/A;   Social History   Socioeconomic History  . Marital status: Married    Spouse name: Not on file  . Number of children: 2  . Years of education: Not on file  . Highest education level: Not on file  Occupational History  . Not on file  Social Needs  . Financial resource strain: Not on file  . Food insecurity:    Worry: Not on file    Inability: Not on file  . Transportation needs:    Medical: Not on file    Non-medical: Not on file  Tobacco Use  . Smoking status: Never Smoker  . Smokeless  tobacco: Never Used  Substance and Sexual Activity  . Alcohol use: No    Frequency: Never  . Drug use: No  . Sexual activity: Not on file  Lifestyle  . Physical activity:    Days per week: Not on file    Minutes per session: Not on file  . Stress: Not on file  Relationships  . Social connections:    Talks on phone: Not on file    Gets together: Not on file    Attends religious service: Not on file    Active member of club or organization: Not on file    Attends meetings of clubs or organizations: Not on file    Relationship status: Not on file  Other Topics Concern  . Not on file  Social History Narrative  . Not on file   Family History  Problem Relation Age of Onset  . Hypertension Mother 70 63 Stroke Mother 70 23 Diabetes Maternal Aunt        s/p bilater amputation due to DM  . Asthma Father   . Colon cancer Neg Hx   . Esophageal cancer Neg Hx   . Stomach cancer Neg Hx   . Rectal cancer Neg Hx    Physical Exam:  Vitals:  03/08/18 1010  BP: (!) 142/59  Pulse: 76  Temp: 97.7 F (36.5 C)  TempSrc: Oral  SpO2: 100%  Weight: 214 lb 14.4 oz (97.5 kg)  Height: 4' 10"  (1.473 m)   General: well appearing, no acute distress HEENT: no thyromegally, no cervical lymphadenopathy  Cardiac: no lower extremity edema    Pulm:  Normal work of breathing, lungs are clear to auscultation  Gastrointestinal: abdomen is soft, suprapubic tenderness to palpation  Neuro: alert and oriented x3, no facial droop or deficit of arm and leg movement  Psych: normal affect, normal mood   Skin: no rashes evident on the exposed skin of the hands, face, lower legs, back   Assessment & Plan:   Hypertension  Amlodipine 5 mg daily, HCTZ 12.5 mg daily, metoprolol succinate 50 mg daily. Last BMP was within the last month and showed normal renal function and electrolytes. At this time she is on prednisone for treatment of crohns and awaiting transition to humira next year when she has insurance  coverage, the steroids are likely contributing to this increase in blood pressure. STOP bang screening is concerning for intermediate risk of sleep apnea ( snoring, daytime fatigue, BMP >35, age >10). She is uninsured and has had great medical expenses in these past few months, she is requesting that sleep study testing be deferred until later in the year. She has a blood pressure monitor at home and is open to taking some recordings and bringing in her monitor for review at her next office visit.  - obtain sleep study when she has insurance coverage  - discussed sodium restriction, light exercise - increase amlodipine 5 mg > 10 mg  - continue HCTZ   Diabetes  Diabetes was diagnosed over 10 years ago. Diabetes is well controlled, her last hemoglobin A1c was 6.8 when checked 3 months ago. She is prescribed Glipizide 5 mg daily.  - follow up in 3 months for hemoglobin A1c, urine microalbumin, retinal eye exam   - Last LDL was 114 - on low potency simvastatin 10 mg daily, she has had trouble with leg cramps while on atorvastatin in the past and declines increasing to a higher intensity statin today.   Crohns  Patient recently underwent workup for what is likely chrons disease. She is following with [facility] and managed with methotrexate 15 mg weekly and prednisone 30 mg daily. She uses bentyl for symptom control. The plan is to begin Humira when she obtains Medicare next year.   Diastolic dysfunction  G8QP 2011 on echo, follow up nuclear stress test was normal. She denies symptoms of dyspnea on exertion, orthopnea, PND, or peripheral edema. We will continue to monitor.   Yeast infection  Having symptoms similar to yeast infection that she has had in the past with puritis and thick vaginal discharge.   - Prescribed fluconazole one time dose with return precautions   GERD  Last endoscopy showed esophagitis and she was found to have a hiatal hernia.  - cont omeprazole 40 mg daily    See Encounters Tab for problem based charting.  Patient seen with Dr. Evette Doffing

## 2018-03-08 NOTE — Patient Instructions (Addendum)
Thank you for coming to the clinic today. It was a pleasure to see you.   For your high blood pressure - please increase the dose of your amlodipine to 10 mg daily. If you record your blood pressure at home please bring the list of your blood pressures with you to the next office visit.    FOLLOW-UP INSTRUCTIONS When: 3 months with your new primary care doctor  For: follow up of your diabetes  What to bring: all of your medication bottles   Please call the internal medicine center clinic if you have any questions or concerns, we may be able to help and keep you from a long and expensive emergency room wait. Our clinic and after hours phone number is [phone], the best time to call is Monday through Friday 9 am to 4 pm but there is always someone available 24/7 if you have an emergency. If you need medication refills please notify your pharmacy one week in advance and they will send [place] a request.

## 2018-03-09 ENCOUNTER — Encounter: Payer: Self-pay | Admitting: Internal Medicine

## 2018-03-09 DIAGNOSIS — K5090 Crohn's disease, unspecified, without complications: Secondary | ICD-10-CM | POA: Insufficient documentation

## 2018-03-09 DIAGNOSIS — B379 Candidiasis, unspecified: Secondary | ICD-10-CM | POA: Insufficient documentation

## 2018-03-09 DIAGNOSIS — I5189 Other ill-defined heart diseases: Secondary | ICD-10-CM | POA: Insufficient documentation

## 2018-03-09 DIAGNOSIS — K219 Gastro-esophageal reflux disease without esophagitis: Secondary | ICD-10-CM | POA: Insufficient documentation

## 2018-03-09 NOTE — Assessment & Plan Note (Signed)
Amlodipine 5 mg daily, HCTZ 12.5 mg daily, metoprolol succinate 50 mg daily. Last BMP was within the last month and showed normal renal function and electrolytes. At this time she is on prednisone for treatment of crohns and awaiting transition to humira next year when she has insurance coverage, the steroids are likely contributing to this increase in blood pressure. STOP bang screening is concerning for intermediate risk of sleep apnea ( snoring, daytime fatigue, BMP >35, age >93). She is uninsured and has had great medical expenses in these past few months, she is requesting that sleep study testing be deferred until later in the year. She has a blood pressure monitor at home and is open to taking some recordings and bringing in her monitor for review at her next office visit.  - obtain sleep study when she has insurance coverage  - discussed sodium restriction, light exercise - increase amlodipine 5 mg > 10 mg  - continue HCTZ

## 2018-03-09 NOTE — Assessment & Plan Note (Signed)
Having symptoms similar to yeast infection that she has had in the past with puritis and thick vaginal discharge.   - Prescribed fluconazole one time dose with return precautions

## 2018-03-09 NOTE — Assessment & Plan Note (Signed)
Diabetes was diagnosed over 10 years ago. Diabetes is well controlled, her last hemoglobin A1c was 6.8 when checked 3 months ago. She is prescribed Glipizide 5 mg daily.  - follow up in 3 months for hemoglobin A1c, urine microalbumin, retinal eye exam   - Last LDL was 114 - on low potency simvastatin 10 mg daily, she has had trouble with leg cramps while on atorvastatin in the past and declines increasing to a higher intensity statin today.

## 2018-03-09 NOTE — Assessment & Plan Note (Signed)
Patient recently underwent workup for what is likely chrons disease. She is following with [facility] and managed with methotrexate 15 mg weekly and prednisone 30 mg daily. She uses bentyl for symptom control. The plan is to begin Humira when she obtains Medicare next year.

## 2018-03-09 NOTE — Assessment & Plan Note (Addendum)
G2DD 2011 on echo, follow up nuclear stress test was normal. She denies symptoms of dyspnea on exertion, orthopnea, PND, or peripheral edema. We will continue to monitor.

## 2018-03-09 NOTE — Assessment & Plan Note (Signed)
Last endoscopy showed esophagitis and she was found to have a hiatal hernia.  - cont omeprazole 40 mg daily

## 2018-03-09 NOTE — Progress Notes (Signed)
Medicine attending: Medical history, presenting problems, physical findings, and medications, reviewed with resident physician Dr Ledell Noss on the day of the patient visit and I concur with her evaluation and management plan. New patient to our clinic. HTN, type 2 DM on oral agent, diastolic cardiac dysfunction,GERD, & Crohn's Colitis recently diagnosed & started on prednisone and methotrexate, followed by GI.  Acute vaginal yeast infection which we will Rx in view of steroids & DM. Will need to monitor sugars closely on steroids.

## 2018-03-15 ENCOUNTER — Other Ambulatory Visit: Payer: Self-pay | Admitting: Physician Assistant

## 2018-03-15 ENCOUNTER — Telehealth: Payer: Self-pay | Admitting: Internal Medicine

## 2018-03-15 ENCOUNTER — Ambulatory Visit: Payer: Self-pay | Admitting: Pulmonary Disease

## 2018-03-15 DIAGNOSIS — Z8679 Personal history of other diseases of the circulatory system: Secondary | ICD-10-CM

## 2018-03-15 DIAGNOSIS — I119 Hypertensive heart disease without heart failure: Secondary | ICD-10-CM

## 2018-03-15 DIAGNOSIS — E1165 Type 2 diabetes mellitus with hyperglycemia: Secondary | ICD-10-CM

## 2018-03-15 NOTE — Telephone Encounter (Signed)
Pt calling with questions regarding when and how often she is to take the MTX. Reviewed with pt that she is to take the methotrexate once a week. Pt verbalized understanding and is to start her Humira tomorrow and Mtx. Pt will needs labs done in 2 weeks. Reminder and orders in epic.

## 2018-03-15 NOTE — Telephone Encounter (Signed)
Patient has questions regarding humira

## 2018-03-16 ENCOUNTER — Other Ambulatory Visit: Payer: Self-pay | Admitting: Internal Medicine

## 2018-03-16 ENCOUNTER — Telehealth: Payer: Self-pay

## 2018-03-16 ENCOUNTER — Other Ambulatory Visit: Payer: Self-pay

## 2018-03-16 MED ORDER — METHOTREXATE 2.5 MG PO TABS: 15.0000 mg | ORAL_TABLET | ORAL | 0 refills | Status: DC

## 2018-03-16 MED ORDER — METHOTREXATE 2.5 MG PO TABS: 15.0000 mg | ORAL_TABLET | ORAL | 6 refills | Status: DC

## 2018-03-16 NOTE — Telephone Encounter (Signed)
Would [facility] be able to help here? May have to wait until next year when she gets medicare Also, if she gets orange card, [facility] may be much much cheaper for MTX Proceed with Humira

## 2018-03-16 NOTE — Telephone Encounter (Signed)
Called and left message for pt to call back. Have coupon for her to use for the methotrexate and need to discuss this with patient.

## 2018-03-16 NOTE — Telephone Encounter (Signed)
Dr Hilarie Fredrickson, please see comment below from pharmacy and advise...  "Pharmacy comment: Patient can't afford.patient has No insurance. 4 of the 7m tablets cost $218. please consider this change to reduce patient cost. Thank you"  To my knowledge, there is no patient assistance for a methotrexate..Marland KitchenMarland Kitchen

## 2018-03-16 NOTE — Telephone Encounter (Signed)
I have spoken to pharmacy at [facility]. They state that we can send methotrexate prescription to them and script should be cheaper at their pharmacy. If the script is still unaffordable for patient, they will give patient other resources to help her get the medication. I have sent prescription to [facility] which I have also now spoken with patient about and she is agreeable to this plan. She will call back if she is still unable to obtain this medication.

## 2018-03-20 NOTE — Telephone Encounter (Signed)
Coupon and letter mailed to pt. Have been unable to reach her by phone after multiple attempts.

## 2018-03-27 ENCOUNTER — Other Ambulatory Visit: Payer: Self-pay | Admitting: Physician Assistant

## 2018-03-27 ENCOUNTER — Ambulatory Visit: Payer: Self-pay | Admitting: Family Medicine

## 2018-03-27 DIAGNOSIS — E1165 Type 2 diabetes mellitus with hyperglycemia: Secondary | ICD-10-CM

## 2018-03-31 ENCOUNTER — Other Ambulatory Visit: Payer: Self-pay | Admitting: *Deleted

## 2018-03-31 ENCOUNTER — Telehealth: Payer: Self-pay | Admitting: Internal Medicine

## 2018-03-31 DIAGNOSIS — K6289 Other specified diseases of anus and rectum: Secondary | ICD-10-CM

## 2018-03-31 MED ORDER — PREDNISONE 20 MG PO TABS: 20.0000 mg | ORAL_TABLET | Freq: Every day | ORAL | 0 refills | Status: DC

## 2018-03-31 NOTE — Telephone Encounter (Signed)
Pt called and made a mistake with her Humira starter kit. She did the 138m dose 2 weeks ago but yesterday she grabbed the 433mpen and did that dose instead of the 8051mose. Pt wants to know what she should do. Please advise.

## 2018-03-31 NOTE — Telephone Encounter (Signed)
Patient has questions regarding Humira dosage

## 2018-03-31 NOTE — Telephone Encounter (Signed)
Left message for pt to call back.  There are no samples. Pt instructed to go ahead with the 43m dose today. She knows to continue with pred taper and will start mtx tomorrow.

## 2018-03-31 NOTE — Telephone Encounter (Signed)
Please see if we have a 40 milligram Humira sample that she could come and get and take today to complete the 80 mg loading dose #2 If 40 mg dose not available I would instruct her to go ahead and take the 80 mg dose today; this will be a slightly higher than usual second dose but I would rather this be the case than have her underdosed given the severity of her disease  She should continue the prednisone taper as instructed at last office visit She will also soon be starting methotrexate

## 2018-04-14 ENCOUNTER — Telehealth: Payer: Self-pay

## 2018-04-14 NOTE — Telephone Encounter (Signed)
Spoke with pt and reminded her she is due for labs on Monday, pt aware. Order in epic.

## 2018-04-14 NOTE — Telephone Encounter (Signed)
-----   Message from Algernon Huxley, RN sent at 03/31/2018 10:25 AM EST ----- Regarding: FW: labs  Pt needs labs 12/23 ----- Message ----- From: Algernon Huxley, RN Sent: 03/30/2018 To: Algernon Huxley, RN Subject: labs                                           Pt needs labs since starting MTX 11/21

## 2018-04-17 ENCOUNTER — Other Ambulatory Visit (INDEPENDENT_AMBULATORY_CARE_PROVIDER_SITE_OTHER): Payer: Self-pay

## 2018-04-17 DIAGNOSIS — K50918 Crohn's disease, unspecified, with other complication: Secondary | ICD-10-CM

## 2018-04-17 LAB — COMPREHENSIVE METABOLIC PANEL
ALT: 18 U/L (ref 0–35)
AST: 9 U/L (ref 0–37)
Albumin: 3.5 g/dL (ref 3.5–5.2)
Alkaline Phosphatase: 76 U/L (ref 39–117)
BILIRUBIN TOTAL: 0.5 mg/dL (ref 0.2–1.2)
BUN: 18 mg/dL (ref 6–23)
CO2: 34 mEq/L — ABNORMAL HIGH (ref 19–32)
Calcium: 9.4 mg/dL (ref 8.4–10.5)
Chloride: 100 mEq/L (ref 96–112)
Creatinine, Ser: 1.07 mg/dL (ref 0.40–1.20)
GFR: 66.24 mL/min (ref >60.00)
GLUCOSE: 131 mg/dL — AB (ref 70–99)
Potassium: 4.1 mEq/L (ref 3.5–5.1)
Sodium: 141 mEq/L (ref 135–145)
Total Protein: 6.4 g/dL (ref 6.0–8.3)

## 2018-04-17 LAB — CBC WITH DIFFERENTIAL/PLATELET
Basophils Absolute: 0 10*3/uL (ref 0.0–0.1)
Basophils Relative: 0.3 % (ref 0.0–3.0)
EOS PCT: 0.8 % (ref 0.0–5.0)
Eosinophils Absolute: 0.1 10*3/uL (ref 0.0–0.7)
HCT: 38.3 % (ref 36.0–46.0)
Hemoglobin: 12.1 g/dL (ref 12.0–15.0)
LYMPHS ABS: 3.2 10*3/uL (ref 0.7–4.0)
Lymphocytes Relative: 51.1 % — ABNORMAL HIGH (ref 12.0–46.0)
MCHC: 31.6 g/dL (ref 30.0–36.0)
MCV: 73.5 fl — AB (ref 78.0–100.0)
MONOS PCT: 6.9 % (ref 3.0–12.0)
Monocytes Absolute: 0.4 10*3/uL (ref 0.1–1.0)
NEUTROS PCT: 40.9 % — AB (ref 43.0–77.0)
Neutro Abs: 2.6 10*3/uL (ref 1.4–7.7)
PLATELETS: 262 10*3/uL (ref 150.0–400.0)
RBC: 5.21 Mil/uL — ABNORMAL HIGH (ref 3.87–5.11)
RDW: 20.1 % — ABNORMAL HIGH (ref 11.5–15.5)
WBC: 6.3 10*3/uL (ref 4.0–10.5)

## 2018-04-20 ENCOUNTER — Other Ambulatory Visit: Payer: Self-pay

## 2018-04-20 DIAGNOSIS — K5090 Crohn's disease, unspecified, without complications: Secondary | ICD-10-CM

## 2018-04-28 ENCOUNTER — Encounter: Payer: Self-pay | Admitting: Internal Medicine

## 2018-04-28 ENCOUNTER — Ambulatory Visit (INDEPENDENT_AMBULATORY_CARE_PROVIDER_SITE_OTHER): Payer: Self-pay | Admitting: Internal Medicine

## 2018-04-28 VITALS — BP 126/84 | HR 72 | Ht <= 58 in | Wt 227.5 lb

## 2018-04-28 DIAGNOSIS — K2100 Gastro-esophageal reflux disease with esophagitis, without bleeding: Secondary | ICD-10-CM

## 2018-04-28 DIAGNOSIS — Z79899 Other long term (current) drug therapy: Secondary | ICD-10-CM

## 2018-04-28 DIAGNOSIS — K5010 Crohn's disease of large intestine without complications: Secondary | ICD-10-CM

## 2018-04-28 DIAGNOSIS — K50113 Crohn's disease of large intestine with fistula: Secondary | ICD-10-CM

## 2018-04-28 DIAGNOSIS — K5900 Constipation, unspecified: Secondary | ICD-10-CM

## 2018-04-28 MED ORDER — METHOTREXATE 2.5 MG PO TABS: 15.0000 mg | ORAL_TABLET | ORAL | 2 refills | Status: DC

## 2018-04-28 MED ORDER — PREDNISONE 5 MG PO TABS: 5.0000 mg | ORAL_TABLET | ORAL | 0 refills | Status: DC

## 2018-04-28 NOTE — Patient Instructions (Addendum)
Continue Humira.  Continue Methotrexate 15 mg once weekly.  Take Miralax (polyethelyne glycol) 17 grams (1 capful) dissolved in at least 8 ounces of water/juice once daily. You may titrate this as needed.  Decrease your prednisone to 10 mg daily x 7 days (starting today). Then, decrease to 5 mg daily x 14 days, then discontinue.  Continue omeprazole 40 mg daily.  Please follow up with Dr Hilarie Fredrickson, 06/23/18 at 9:00 am.  If you are age 65 or older, your body mass index should be between 23-30. Your Body mass index is 47.55 kg/m. If this is out of the aforementioned range listed, please consider follow up with your Primary Care Provider.  If you are age 43 or younger, your body mass index should be between 19-25. Your Body mass index is 47.55 kg/m. If this is out of the aformentioned range listed, please consider follow up with your Primary Care Provider.

## 2018-04-28 NOTE — Progress Notes (Signed)
Subjective:    Patient ID: Kathy [name], female    DOB: Jul 19, 1955, 63 y.o.   MRN: [id number]  HPI Kathy [name] is a 63 year old female with a history of Crohn's colitis with perianal involvement, hypertension, hyperlipidemia, diabetes, asthma who is here for follow-up.  She is here alone today and was last seen on 03/02/2018.  Since her last visit she has started Humira with standard induction along with oral methotrexate therapy 15 mg once weekly.  She has been on prednisone 20 mg daily.  She is feeling considerably better.  Her perianal and rectal pain has resolved.  Her bowel movements have become more regular with much less diarrhea.  No recent rectal bleeding.  She has been gaining weight which she is concerned about and attributes to steroids.  Bowel movements can occur once per day but feel incomplete and this will go for several days and then she will have 4-5 stools on a given day.  Some straining recently for a bowel movement.  No nausea or vomiting.  No abdominal pain.  No fevers or chills.  She traveled to [place] over the holidays and had a wonderful trip.  She has continued omeprazole 40 mg daily started after endoscopy showed mild esophagitis.  No heartburn, dysphagia or odynophagia.  Review of Systems As per HPI, otherwise negative  Current Medications, Allergies, Past Medical History, Past Surgical History, Family History and Social History were reviewed in [employer] record.     Objective:   Physical Exam BP 126/84   Pulse 72   Ht 4' 10"  (1.473 m)   Wt 227 lb 8 oz (103.2 kg)   BMI 47.55 kg/m  Constitutional: Well-developed and well-nourished. No distress. HEENT: Normocephalic and atraumatic.  Conjunctivae are normal.  No scleral icterus. Neck: Neck supple. Trachea midline. Cardiovascular: Normal rate, regular rhythm and intact distal pulses. No M/R/G Pulmonary/chest: Effort normal and breath sounds normal. No wheezing, rales  or rhonchi. Abdominal: Soft, obese,  nontender, nondistended. Bowel sounds active throughout.  Perianal exam/rectal: External exam only but much improved perianal edema and firmness, skin lesions have healed in the intergluteal cleft superiorly, no tenderness to palpation around the perianal skin or anus Extremities: no clubbing, cyanosis, or edema Neurological: Alert and oriented to person place and time. Skin: Skin is warm and dry. Psychiatric: Normal mood and affect. Behavior is normal.  CBC    Component Value Date/Time   WBC 6.3 04/17/2018 1427   RBC 5.21 (H) 04/17/2018 1427   HGB 12.1 04/17/2018 1427   HCT 38.3 04/17/2018 1427   PLT 262.0 04/17/2018 1427   MCV 73.5 (L) 04/17/2018 1427   MCH 22.6 (L) 10/03/2017 0649   MCHC 31.6 04/17/2018 1427   RDW 20.1 (H) 04/17/2018 1427   LYMPHSABS 3.2 04/17/2018 1427   MONOABS 0.4 04/17/2018 1427   EOSABS 0.1 04/17/2018 1427   BASOSABS 0.0 04/17/2018 1427   CMP     Component Value Date/Time   NA 141 04/17/2018 1427   K 4.1 04/17/2018 1427   CL 100 04/17/2018 1427   CO2 34 (H) 04/17/2018 1427   GLUCOSE 131 (H) 04/17/2018 1427   BUN 18 04/17/2018 1427   CREATININE 1.07 04/17/2018 1427   CALCIUM 9.4 04/17/2018 1427   PROT 6.4 04/17/2018 1427   ALBUMIN 3.5 04/17/2018 1427   AST 9 04/17/2018 1427   ALT 18 04/17/2018 1427   ALKPHOS 76 04/17/2018 1427   BILITOT 0.5 04/17/2018 1427   GFRNONAA 56 (L) 12/14/2017 1009  GFRAA 64 12/14/2017 1009   hsCRP 19 (Nov 2019); 58 (Sept 2019)  Iron/TIBC/Ferritin/ %Sat    Component Value Date/Time   IRON 39 (L) 01/11/2018 1218   IRON 35 12/14/2017 1009   TIBC 274 12/14/2017 1009   FERRITIN 62.1 01/11/2018 1218   FERRITIN 103 12/14/2017 1009   IRONPCTSAT 11.9 (L) 01/11/2018 1218   IRONPCTSAT 13 (L) 12/14/2017 1009        Assessment & Plan:  63 year old female with a history of Crohn's colitis with perianal involvement, hypertension, hyperlipidemia, diabetes, asthma who is here for  follow-up.  1.  Crohn's colitis with perianal involvement/perianal fistula --she has improved tremendously since starting Humira and methotrexate therapy.  Humira started early December 2019.  She is tolerating both of these medications well.  It is certainly time to taper her prednisone. --Continue Humira 40 mg every 14 days --Continue oral methotrexate 15 mg once weekly --Decrease prednisone to 10 mg x 7 days then 5 mg x 14 days then stop.  She is reminded to notify me if symptoms worsen as she tapers off of prednisone  2.  Mild constipation --resulting from improving colitis.  Can add MiraLAX 17 g daily, titrate dose for effect and complete bowel movement  3.  GERD with history of esophagitis and gastritis --we will continue omeprazole 40 mg daily.  We will certainly need this while on steroids.  To consider tapering off at next visit and with weight loss.  54-monthfollow-up, sooner if necessary  25 minutes spent with the patient today. Greater than 50% was spent in counseling and coordination of care with the patient

## 2018-05-15 ENCOUNTER — Telehealth: Payer: Self-pay

## 2018-05-15 NOTE — Telephone Encounter (Signed)
Left message for pt to call back  °

## 2018-05-15 NOTE — Telephone Encounter (Signed)
-----   Message from Algernon Huxley, RN sent at 04/20/2018 10:57 AM EST ----- Regarding: Labs Pt needs labs, orders in epic.

## 2018-05-16 NOTE — Telephone Encounter (Signed)
Pt knows to come for labs, orders in epic.

## 2018-05-18 NOTE — Progress Notes (Signed)
   CC: Diabetes Mellitus  HPI:  [name] is a 63 y.o. 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 Hilarie Fredrickson, diastolic heart failure who presents for diabetes follow-up. Please see problem based charting for evaluation, assessment, and plan.       Past Medical History:  Diagnosis Date  . Asthma    09-23-2017  per pt has not done inhaler since May 2018, no insurance (QVAR bid and Ventolin prn)  . Diastolic dysfunction    per echo [id number]  grade 2 diastolic dysfunction , ef 60-65%  . Esophagitis   . GERD (gastroesophageal reflux disease)   . Hemorrhoids   . Hiatal hernia   . Hyperlipidemia    stopped meds due to side effects  . Hypertension    09-23-2017  per pt has takes bp meds since May 2018 no insurance (losartan 138m qd, HCTZ 12.528mqd, and toprol 50 qd)  . Type 2 diabetes mellitus (HCParker   09-23-2017 per pt has not taken diabetic meds since May 2018, no insurance (kombiglyze [id number])   Review of Systems:    Review of Systems  Respiratory: Positive for shortness of breath.   Cardiovascular: Negative for chest pain.  Gastrointestinal: Negative for nausea and vomiting.   Physical Exam:  Vitals:   05/19/18 1337  BP: (!) 117/50  Pulse: 76  Temp: 98 F (36.7 C)  TempSrc: Oral  SpO2: 99%  Weight: 230 lb 4.8 oz (104.5 kg)  Height: 4' 10"  (1.473 m)   Physical Exam  Constitutional: She appears well-developed and well-nourished. No distress.  HENT:  Head: Normocephalic and atraumatic.  Eyes: Conjunctivae are normal.  Cardiovascular: Normal rate, regular rhythm and normal heart sounds.  Orthopneic. JVD could not be appreciated due to body habitus  Respiratory: Effort normal and breath sounds normal. No respiratory distress. She has no wheezes.  GI: Soft. Bowel sounds are normal. She exhibits no distension. There is abdominal tenderness (epigastric and luq to  palpation).  Musculoskeletal:        General: No edema.  Skin: She is not diaphoretic. No erythema.  Psychiatric: Judgment and thought content normal. Her mood appears anxious. Her affect is angry. Her speech is rapid and/or pressured. She is agitated.    Assessment & Plan:   See Encounters Tab for problem based charting.  Patient discussed with Dr. VinEvette Doffing

## 2018-05-19 ENCOUNTER — Encounter: Payer: Self-pay | Admitting: Internal Medicine

## 2018-05-19 ENCOUNTER — Ambulatory Visit: Payer: Self-pay | Admitting: Internal Medicine

## 2018-05-19 ENCOUNTER — Other Ambulatory Visit: Payer: Self-pay

## 2018-05-19 ENCOUNTER — Other Ambulatory Visit (INDEPENDENT_AMBULATORY_CARE_PROVIDER_SITE_OTHER): Payer: Self-pay

## 2018-05-19 VITALS — BP 117/50 | HR 76 | Temp 98.0°F | Ht <= 58 in | Wt 230.3 lb

## 2018-05-19 DIAGNOSIS — R0602 Shortness of breath: Secondary | ICD-10-CM

## 2018-05-19 DIAGNOSIS — R451 Restlessness and agitation: Secondary | ICD-10-CM

## 2018-05-19 DIAGNOSIS — E11649 Type 2 diabetes mellitus with hypoglycemia without coma: Secondary | ICD-10-CM

## 2018-05-19 DIAGNOSIS — K5090 Crohn's disease, unspecified, without complications: Secondary | ICD-10-CM

## 2018-05-19 DIAGNOSIS — I152 Hypertension secondary to endocrine disorders: Secondary | ICD-10-CM

## 2018-05-19 DIAGNOSIS — Z7984 Long term (current) use of oral hypoglycemic drugs: Secondary | ICD-10-CM

## 2018-05-19 DIAGNOSIS — E119 Type 2 diabetes mellitus without complications: Secondary | ICD-10-CM

## 2018-05-19 DIAGNOSIS — K50919 Crohn's disease, unspecified, with unspecified complications: Secondary | ICD-10-CM

## 2018-05-19 DIAGNOSIS — I5030 Unspecified diastolic (congestive) heart failure: Secondary | ICD-10-CM

## 2018-05-19 DIAGNOSIS — E1159 Type 2 diabetes mellitus with other circulatory complications: Secondary | ICD-10-CM

## 2018-05-19 DIAGNOSIS — K209 Esophagitis, unspecified: Secondary | ICD-10-CM

## 2018-05-19 DIAGNOSIS — I10 Essential (primary) hypertension: Secondary | ICD-10-CM

## 2018-05-19 DIAGNOSIS — K50113 Crohn's disease of large intestine with fistula: Secondary | ICD-10-CM

## 2018-05-19 DIAGNOSIS — F419 Anxiety disorder, unspecified: Secondary | ICD-10-CM

## 2018-05-19 DIAGNOSIS — R0600 Dyspnea, unspecified: Secondary | ICD-10-CM

## 2018-05-19 DIAGNOSIS — Z0000 Encounter for general adult medical examination without abnormal findings: Secondary | ICD-10-CM | POA: Insufficient documentation

## 2018-05-19 DIAGNOSIS — K50118 Crohn's disease of large intestine with other complication: Secondary | ICD-10-CM

## 2018-05-19 DIAGNOSIS — K449 Diaphragmatic hernia without obstruction or gangrene: Secondary | ICD-10-CM

## 2018-05-19 DIAGNOSIS — Z79899 Other long term (current) drug therapy: Secondary | ICD-10-CM

## 2018-05-19 DIAGNOSIS — R454 Irritability and anger: Secondary | ICD-10-CM

## 2018-05-19 LAB — IBC PANEL
Iron: 66 ug/dL (ref 42–145)
Saturation Ratios: 23.7 % (ref 20.0–50.0)
Transferrin: 199 mg/dL — ABNORMAL LOW (ref 212.0–360.0)

## 2018-05-19 LAB — CBC WITH DIFFERENTIAL/PLATELET
BASOS ABS: 0.1 10*3/uL (ref 0.0–0.1)
Basophils Relative: 0.7 % (ref 0.0–3.0)
EOS ABS: 0.1 10*3/uL (ref 0.0–0.7)
Eosinophils Relative: 1.4 % (ref 0.0–5.0)
HCT: 35.9 % — ABNORMAL LOW (ref 36.0–46.0)
Hemoglobin: 11.3 g/dL — ABNORMAL LOW (ref 12.0–15.0)
Lymphocytes Relative: 37.2 % (ref 12.0–46.0)
Lymphs Abs: 3.1 10*3/uL (ref 0.7–4.0)
MCHC: 31.4 g/dL (ref 30.0–36.0)
MCV: 74.8 fl — ABNORMAL LOW (ref 78.0–100.0)
MONO ABS: 0.7 10*3/uL (ref 0.1–1.0)
MONOS PCT: 8.9 % (ref 3.0–12.0)
NEUTROS PCT: 51.8 % (ref 43.0–77.0)
Neutro Abs: 4.3 10*3/uL (ref 1.4–7.7)
Platelets: 412 10*3/uL — ABNORMAL HIGH (ref 150.0–400.0)
RBC: 4.8 Mil/uL (ref 3.87–5.11)
RDW: 17.5 % — ABNORMAL HIGH (ref 11.5–15.5)
WBC: 8.3 10*3/uL (ref 4.0–10.5)

## 2018-05-19 LAB — COMPREHENSIVE METABOLIC PANEL
ALBUMIN: 3.4 g/dL — AB (ref 3.5–5.2)
ALK PHOS: 66 U/L (ref 39–117)
ALT: 20 U/L (ref 0–35)
AST: 12 U/L (ref 0–37)
BUN: 16 mg/dL (ref 6–23)
CO2: 31 mEq/L (ref 19–32)
Calcium: 9.2 mg/dL (ref 8.4–10.5)
Chloride: 102 mEq/L (ref 96–112)
Creatinine, Ser: 1.09 mg/dL (ref 0.40–1.20)
GFR: 60.99 mL/min (ref >60.00)
Glucose, Bld: 81 mg/dL (ref 70–99)
POTASSIUM: 3.9 meq/L (ref 3.5–5.1)
SODIUM: 142 meq/L (ref 135–145)
TOTAL PROTEIN: 6.3 g/dL (ref 6.0–8.3)
Total Bilirubin: 0.2 mg/dL (ref 0.2–1.2)

## 2018-05-19 LAB — GLUCOSE, CAPILLARY
Glucose-Capillary: 40 mg/dL — CL (ref 70–99)
Glucose-Capillary: 113 mg/dL — ABNORMAL HIGH (ref 70–99)

## 2018-05-19 LAB — HIGH SENSITIVITY CRP: CRP HIGH SENSITIVITY: 34.99 mg/L — AB (ref 0.000–5.000)

## 2018-05-19 LAB — POCT GLYCOSYLATED HEMOGLOBIN (HGB A1C): HEMOGLOBIN A1C: 10.8 % — AB (ref 4.0–5.6)

## 2018-05-19 LAB — BRAIN NATRIURETIC PEPTIDE: B Natriuretic Peptide: 48 pg/mL (ref 0.0–100.0)

## 2018-05-19 LAB — FERRITIN: FERRITIN: 169.4 ng/mL (ref 10.0–291.0)

## 2018-05-19 MED ORDER — GLIPIZIDE ER 10 MG PO TB24: 10.0000 mg | ORAL_TABLET | Freq: Every day | ORAL | 1 refills | Status: DC

## 2018-05-19 NOTE — Assessment & Plan Note (Signed)
The patient's blood pressure during this visit was 117/50. The patient is currently taking hydrochlorothiazide 12.5 mg daily, metoprolol 50 mg daily, amlodipine 10 mg daily. His last blood pressure visits are   BP Readings from Last 3 Encounters:  05/19/18 (!) 117/50  04/28/18 126/84  03/08/18 (!) 142/59   Assessment and plan  Patient's blood pressure is under good control, recommend continuing current regimen.

## 2018-05-19 NOTE — Assessment & Plan Note (Addendum)
The patient states that she has been exhausted and short of breath for the last few months. Patient is a never smoker.  When asked to elaborate on her sympotms the patient mentioned "I don't know how to describe it, I just cant walk around in the grocery store". She also stated that she was frustrated in having to answer so many questions about her problems.   Assessment and plan  The patient does not have peripheral edema. JVD could not be appreciated. She was orthopneic on exam. She does not have iron deficiency anemia based on normal iron and ferritin. BNP within normal limits. Will need to consider ordering spirometry after discussing with patient at next visit.

## 2018-05-19 NOTE — Assessment & Plan Note (Signed)
Patient had colonoscopy was in September 2019 which showed palpable anal lesions extending into distal rectum and small patch of mucosal erythema in the cecum with friability in the rectum, rectosigmoid and sigmoid.  Upper endoscopy showed mild esophagitis with a 2 cm hiatal hernia.  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 Biopsy found noncaseating granulomas which prompted workup for possible sarcoidosis that was negative.   The patient was diagnosed with crohns disease and she was recently started on humira 1-2 months ago and has been on prednisone for the past few months. The patient states that she is tolerating the humira well and it is helping control her crohns symptoms.   Assessment and plan  The patient has been tapered off prednisone (last done 1 day ago). She should continue to follow with GI and do the following:  -Continue Bentyl 10 mg 3 times daily -Continue Humira 40 mg every 14 days

## 2018-05-19 NOTE — Assessment & Plan Note (Signed)
Ordered urine microalbumin and hep c ab

## 2018-05-19 NOTE — Assessment & Plan Note (Signed)
The patient's last a1c=6.8 in August 2019 and her blood glucose was 40. She is currently only taking glipizide 53m qd.She has gained 15lbs over the past 3 months.   Assessment and plan  The patient was given juice and snacks to manage her hypoglycemia and her blood glucose reading subsequently was 114. She mentioned that she had never had such a late appointment (afternoon) causing her to not be able to eat her lunch prior to coming.  The patient's uncontrolled diabetes is likely due to her concomitant diagnosis of crohn's disease and because she has been on prednisone for the last several months. Due to her a1c being so high I recommended she be started on a glp1 agonist which can help her with weight loss and a1c control. The patient initially agreed to the plan even after being told that it was an injectable medication, but later refused. I explained to her that since her blood glucose readings are so uncontrolled an oral medication is likely to not control her diabetes. I told her that this medication is not insulin and educated her on the many benefits.   The patient became very angry and expressed that it was unreasonable that I would think that she would be alright with having to inject herself so man times as she was already having to take humira. The patient seems to have a lot of stress and anxiety (which is understandable) associated with her new Crohn's diagnosis and starting Humira.   I therefore opted to increase the patient's glipizide from 538mto 1070mShe will need to be monitored closely for hypoglycemia episodes given that she is on a sulfonylurea and not eating meals at consistent times.

## 2018-05-19 NOTE — Progress Notes (Signed)
Hypoglycemic Event  CBG: 40  Treatment: 1 tube glucose gel and 3 glucose tabs.  Symptoms: Shaky - pt stated she thought it was related to "something else going on".  Follow-up CBG: [id number] PM CBG Result:113  Possible Reasons for Event: Stated she has not had lunch yet; "this is my lunchtime".  Comments/MD notified: Yes, Dr Maricela Bo.    [facility]

## 2018-05-19 NOTE — Patient Instructions (Addendum)
It was a pleasure to see you today Kathy [name]. Please make the following changes:  Please take glipizide (glucotrol XL) 100m daily I have ordered some labs that I will get back to you about   If you have any questions or concerns, please call our clinic at [phone] 9am-5pm and after hours call [phone] and ask for the internal medicine resident on call. If you feel you are having a medical emergency please call 911.   Thank you, we look forward to help you remain healthy!  VLars Mage MD Internal Medicine PGY2

## 2018-05-20 LAB — HEPATITIS C ANTIBODY

## 2018-05-20 LAB — MICROALBUMIN / CREATININE URINE RATIO
Creatinine, Urine: 70.6 mg/dL
MICROALB/CREAT RATIO: 10 mg/g{creat} (ref 0–29)
Microalbumin, Urine: 7.3 ug/mL

## 2018-05-22 NOTE — Progress Notes (Signed)
Internal Medicine Clinic Attending  I saw and evaluated the patient.  I personally confirmed the key portions of the history and exam documented by Dr. Chundi and I reviewed pertinent patient test results.  The assessment, diagnosis, and plan were formulated together and I agree with the documentation in the resident's note. 

## 2018-05-23 ENCOUNTER — Telehealth: Payer: Self-pay | Admitting: *Deleted

## 2018-05-23 NOTE — Telephone Encounter (Signed)
Pt called / informed "her hepatitis c screen and her microalbumin/creatinine ratio (to assess for renal disease in diabetics) remains within normal limits." per Dr Maricela Bo.Stated ok and c/o sorethroat - ACC appt scheduled for in the morning.

## 2018-05-23 NOTE — Telephone Encounter (Signed)
-----   Message from Lars Mage, MD sent at 05/23/2018 11:10 AM EST ----- I tried to reach Kathy [name] regarding her lab results as well and was not able to. Could you please call her and let her know that her hepatitis c screen and her microalbumin/creatinine ratio (to assess for renal disease in diabetics) remains within normal limits. Sorry, I dont know why I am not able to reach patients today. Thank you!  Vahini

## 2018-05-24 ENCOUNTER — Telehealth: Payer: Self-pay | Admitting: Internal Medicine

## 2018-05-24 ENCOUNTER — Other Ambulatory Visit: Payer: Self-pay

## 2018-05-24 ENCOUNTER — Ambulatory Visit (INDEPENDENT_AMBULATORY_CARE_PROVIDER_SITE_OTHER): Payer: Self-pay | Admitting: Internal Medicine

## 2018-05-24 ENCOUNTER — Encounter: Payer: Self-pay | Admitting: Internal Medicine

## 2018-05-24 VITALS — BP 116/48 | HR 82 | Temp 98.6°F | Ht <= 58 in | Wt 227.7 lb

## 2018-05-24 DIAGNOSIS — K5090 Crohn's disease, unspecified, without complications: Secondary | ICD-10-CM

## 2018-05-24 DIAGNOSIS — Z79899 Other long term (current) drug therapy: Secondary | ICD-10-CM

## 2018-05-24 DIAGNOSIS — B370 Candidal stomatitis: Secondary | ICD-10-CM

## 2018-05-24 MED ORDER — FLUCONAZOLE 100 MG PO TABS: ORAL_TABLET | ORAL | 0 refills | Status: DC

## 2018-05-24 NOTE — Telephone Encounter (Signed)
Spoke with pt and she is aware, new script sent to pharmacy for pt.

## 2018-05-24 NOTE — Patient Instructions (Signed)
FOLLOW-UP INSTRUCTIONS When: If your symptoms worsen or fail to improve What to bring: All of your medications  I have added fluconzale to your medictions today. Please take 2 tablets of the 151m's the first day and then 1 tablet a day after.   Please do not take your simvastatin while you are taking the fluconazole.   As always if your symptoms worsen, fail to improve, or you develop other concerning symptoms, please notify our office or visit the local ER if we are unavailable. Symptoms including shortness of , high fever, inability to swallow your saliva despite pain, should not be ignored and should encourage you to visit the ED or call 911 if severe.  [facility]. If you have any questions or concerns please call [place] [phone]

## 2018-05-24 NOTE — Telephone Encounter (Signed)
Pt had a sore throat and was seen by her PCP this am. She has yeast in her throat and was prescribed diflucan. Pt is due to take Humira tomorrow and MTX on Saturday. Pt wants to know if it is ok to proceed with humira and mtx. Please advise.

## 2018-05-24 NOTE — Progress Notes (Signed)
   CC: "sorethroat"  HPI:[name] is a 63 y.o. female who presents for evaluation of her sore throat today. Please see individual problem based A/P for details.   PHQ-9: Based on the patients    Office Visit from 05/24/2018 in [place]  PHQ-9 Total Score  3     score we have decided to monitor based on my conversation with the patient today.  Past Medical History:  Diagnosis Date  . Asthma    09-23-2017  per pt has not done inhaler since May 2018, no insurance (QVAR bid and Ventolin prn)  . Diastolic dysfunction    per echo [id number]  grade 2 diastolic dysfunction , ef 60-65%  . Esophagitis   . GERD (gastroesophageal reflux disease)   . Hemorrhoids   . Hiatal hernia   . Hyperlipidemia    stopped meds due to side effects  . Hypertension    09-23-2017  per pt has takes bp meds since May 2018 no insurance (losartan 11m qd, HCTZ 12.525mqd, and toprol 50 qd)  . Type 2 diabetes mellitus ([place]   09-23-2017 per pt has not taken diabetic meds since May 2018, no insurance (kombiglyze [id number])   Review of Systems:  ROS negative except as per HPI.  Physical Exam: Vitals:   05/24/18 0857  BP: (!) 116/48  Pulse: 82  Temp: 98.6 F (37 C)  TempSrc: Oral  SpO2: 99%  Weight: 227 lb 11.2 oz (103.3 kg)  Height: 4' 10"  (1.473 m)   General: A/O x4, in no acute distress, afebrile, nondiaphoretic HEENT: There is notable white plaques on the patients tongue and tonsils that can be partially removed with scrapping, there is no observable abscess or unilateral edema, there is marked erythema of the pharynx but it appears patent. Nasal passages absent inflammation or discharge.  Cardio: RRR, no mrg's Pulmonary: CTA bilaterally, no stridor or wheezing noted  Assessment & Plan:   See Encounters Tab for problem based charting.  Patient discussed with Dr. ButLynnae January

## 2018-05-24 NOTE — Telephone Encounter (Signed)
Yes it is but I would probably lengthen Diflucan treatment Fluconazole 200 mg x 1 day, 100 mg x 13 days Continue methotrexate and Humira

## 2018-05-24 NOTE — Progress Notes (Signed)
Internal Medicine Clinic Attending  Case discussed with Dr. Harbrecht at the time of the visit.  We reviewed the resident's history and exam and pertinent patient test results.  I agree with the assessment, diagnosis, and plan of care documented in the resident's note.   

## 2018-05-24 NOTE — Assessment & Plan Note (Signed)
  Pharyngitis: 2-3 days of throat pain most notably with attempting to swallow large pills or poorly masticated food but present with water and saliva as well. She denied dyspnea, fever, myalgias, chest pain, sinus symptoms, eye/ear pain or drainage, cough, sputum, swollen lymph glands or fatigue. She endorses a history of recently stopping prednisone for her crohn's and and is currently taking both methotrexate and Humira. She denied known recent antibiotic use but endorses a vaginal "yeast" infection currently.   CENTOR score of of 1. Given her physical exam and history with risk factors she is most likely experiencing an oral candidiasis infection.  Plan: Fluconazole 270m loading dose followed by 9 additional days of 1038mPO. Will order HIV as this does not appear on her chart and is an independent risk factor for oral candidiasis.  She appears stable for home treatment but was given strict return precautions. I advised her to return in one week to ensure resolution of her symptoms.

## 2018-05-25 LAB — HIV ANTIBODY (ROUTINE TESTING W REFLEX): HIV SCREEN 4TH GENERATION: NONREACTIVE

## 2018-05-26 ENCOUNTER — Telehealth: Payer: Self-pay | Admitting: Internal Medicine

## 2018-05-26 NOTE — Telephone Encounter (Signed)
Called patient and notified her of the normal HIV. She stated that she is feeling better and is able to swallow food more easily and denied fever or other concern. She will continue her current treatment.  [name], MD Operating Room Services Internal Medicine, PGY-2

## 2018-06-14 ENCOUNTER — Other Ambulatory Visit: Payer: Self-pay | Admitting: Family Medicine

## 2018-06-14 DIAGNOSIS — Z8679 Personal history of other diseases of the circulatory system: Secondary | ICD-10-CM

## 2018-06-14 NOTE — Telephone Encounter (Signed)
Requested medication (s) are due for refill today: Yes  Requested medication (s) are on the active medication list: Yes  Last refill:  03/15/18  Future visit scheduled: No  Notes to clinic:  See request    Requested Prescriptions  Pending Prescriptions Disp Refills   metoprolol succinate (TOPROL-XL) 50 MG 24 hr tablet [Pharmacy Med Name: Metoprolol Succinate ER 50 MG Oral Tablet Extended Release 24 Hour] 90 tablet 0    Sig: TAKE 1 TABLET BY MOUTH ONCE DAILY     Cardiovascular:  Beta Blockers Failed - 06/14/2018  2:20 PM      Failed - Last BP in normal range    BP Readings from Last 1 Encounters:  05/24/18 (!) 116/48         Failed - Valid encounter within last 6 months    Recent Outpatient Visits          6 months ago History of diastolic dysfunction   [facility] [place], PA-C   1 year ago Hemorrhoids, unspecified hemorrhoid type   [facility] at [place], [place], [place]             Passed - Last Heart Rate in normal range    Pulse Readings from Last 1 Encounters:  05/24/18 82

## 2018-06-16 ENCOUNTER — Other Ambulatory Visit: Payer: Self-pay | Admitting: Family Medicine

## 2018-06-16 DIAGNOSIS — Z8679 Personal history of other diseases of the circulatory system: Secondary | ICD-10-CM

## 2018-06-16 NOTE — Telephone Encounter (Signed)
Left message- patient maybe seeing another practice at this time- call to patient- left message for her to call back to let use know if we need to schedule her an appointment for her medication refill.

## 2018-06-16 NOTE — Telephone Encounter (Signed)
Requested medication (s) are due for refill today -yes  Requested medication (s) are on the active medication list -yes  Future visit scheduled -no  Last refill: 03/15/18  Notes to clinic: Patient is requesting medication that may be being followed by another provider now- left message for patient to call office to schedule appointment for BP follow up.  Requested Prescriptions  Pending Prescriptions Disp Refills   hydrochlorothiazide (MICROZIDE) 12.5 MG capsule [Pharmacy Med Name: hydroCHLOROthiazide 12.5 MG Oral Capsule] 90 capsule 0    Sig: TAKE 1 CAPSULE BY MOUTH ONCE DAILY FOR BLOOD PRESSURE     Cardiovascular: Diuretics - Thiazide Failed - 06/16/2018  2:27 PM      Failed - Last BP in normal range    BP Readings from Last 1 Encounters:  05/24/18 (!) 116/48         Failed - Valid encounter within last 6 months    Recent Outpatient Visits          6 months ago History of diastolic dysfunction   [facility] [place], PA-C   1 year ago Hemorrhoids, unspecified hemorrhoid type   [facility] [place], [place] in normal range and within 360 days    Calcium  Date Value Ref Range Status  05/19/2018 9.2 8.4 - 10.5 mg/dL Final         Passed - Cr in normal range and within 360 days    Creatinine, Ser  Date Value Ref Range Status  05/19/2018 1.09 0.40 - 1.20 mg/dL Final         Passed - K in normal range and within 360 days    Potassium  Date Value Ref Range Status  05/19/2018 3.9 3.5 - 5.1 mEq/L Final         Passed - Na in normal range and within 360 days    Sodium  Date Value Ref Range Status  05/19/2018 142 135 - 145 mEq/L Final          Requested Prescriptions  Pending Prescriptions Disp Refills   hydrochlorothiazide (MICROZIDE) 12.5 MG capsule [Pharmacy Med Name: hydroCHLOROthiazide 12.5 MG Oral Capsule] 90 capsule 0    Sig: TAKE 1 CAPSULE BY MOUTH ONCE DAILY FOR BLOOD PRESSURE     Cardiovascular: Diuretics - Thiazide Failed - 06/16/2018  2:27 PM      Failed - Last BP in normal range    BP Readings from Last 1 Encounters:  05/24/18 (!) 116/48         Failed - Valid encounter within last 6 months    Recent Outpatient Visits          6 months ago History of diastolic dysfunction   [facility] [place], [facility], PA-C   1 year ago Hemorrhoids, unspecified hemorrhoid type   [facility] at [place], [place], [place]             Passed - Ca in normal range and within 360 days    Calcium  Date Value Ref Range Status  05/19/2018 9.2 8.4 - 10.5 mg/dL Final         Passed - Cr in normal range and within 360 days    Creatinine, Ser  Date Value Ref Range Status  05/19/2018 1.09 0.40 - 1.20 mg/dL Final         Passed - K in normal range  and within 360 days    Potassium  Date Value Ref Range Status  05/19/2018 3.9 3.5 - 5.1 mEq/L Final         Passed - Na in normal range and within 360 days    Sodium  Date Value Ref Range Status  05/19/2018 142 135 - 145 mEq/L Final

## 2018-06-20 ENCOUNTER — Other Ambulatory Visit: Payer: Self-pay | Admitting: Family Medicine

## 2018-06-20 DIAGNOSIS — Z8679 Personal history of other diseases of the circulatory system: Secondary | ICD-10-CM

## 2018-06-22 ENCOUNTER — Other Ambulatory Visit: Payer: Self-pay

## 2018-06-22 DIAGNOSIS — E7800 Pure hypercholesterolemia, unspecified: Secondary | ICD-10-CM

## 2018-06-22 DIAGNOSIS — I10 Essential (primary) hypertension: Secondary | ICD-10-CM

## 2018-06-22 DIAGNOSIS — E1165 Type 2 diabetes mellitus with hyperglycemia: Secondary | ICD-10-CM

## 2018-06-22 DIAGNOSIS — Z8679 Personal history of other diseases of the circulatory system: Secondary | ICD-10-CM

## 2018-06-22 DIAGNOSIS — E1159 Type 2 diabetes mellitus with other circulatory complications: Secondary | ICD-10-CM

## 2018-06-22 MED ORDER — METOPROLOL SUCCINATE ER 50 MG PO TB24: 50.0000 mg | ORAL_TABLET | Freq: Every day | ORAL | 3 refills | Status: DC

## 2018-06-22 MED ORDER — SIMVASTATIN 10 MG PO TABS: 10.0000 mg | ORAL_TABLET | Freq: Every day | ORAL | 3 refills | Status: DC

## 2018-06-22 NOTE — Telephone Encounter (Signed)
Requesting to speak with a nurse about meds. Please call pt back.

## 2018-06-23 ENCOUNTER — Encounter: Payer: Self-pay | Admitting: Internal Medicine

## 2018-06-23 ENCOUNTER — Ambulatory Visit (INDEPENDENT_AMBULATORY_CARE_PROVIDER_SITE_OTHER): Payer: Self-pay | Admitting: Internal Medicine

## 2018-06-23 ENCOUNTER — Other Ambulatory Visit (INDEPENDENT_AMBULATORY_CARE_PROVIDER_SITE_OTHER): Payer: Self-pay

## 2018-06-23 VITALS — BP 124/72 | HR 88 | Ht 59.0 in | Wt 230.0 lb

## 2018-06-23 DIAGNOSIS — R739 Hyperglycemia, unspecified: Secondary | ICD-10-CM

## 2018-06-23 DIAGNOSIS — K2100 Gastro-esophageal reflux disease with esophagitis, without bleeding: Secondary | ICD-10-CM

## 2018-06-23 DIAGNOSIS — R5383 Other fatigue: Secondary | ICD-10-CM

## 2018-06-23 DIAGNOSIS — R5381 Other malaise: Secondary | ICD-10-CM

## 2018-06-23 DIAGNOSIS — D649 Anemia, unspecified: Secondary | ICD-10-CM

## 2018-06-23 DIAGNOSIS — K50119 Crohn's disease of large intestine with unspecified complications: Secondary | ICD-10-CM

## 2018-06-23 DIAGNOSIS — Z79899 Other long term (current) drug therapy: Secondary | ICD-10-CM

## 2018-06-23 LAB — COMPREHENSIVE METABOLIC PANEL
ALBUMIN: 4.2 g/dL (ref 3.5–5.2)
ALT: 50 U/L — ABNORMAL HIGH (ref 0–35)
AST: 27 U/L (ref 0–37)
Alkaline Phosphatase: 82 U/L (ref 39–117)
BUN: 14 mg/dL (ref 6–23)
CO2: 29 mEq/L (ref 19–32)
Calcium: 9.8 mg/dL (ref 8.4–10.5)
Chloride: 101 mEq/L (ref 96–112)
Creatinine, Ser: 1.14 mg/dL (ref 0.40–1.20)
GFR: 57.9 mL/min — ABNORMAL LOW (ref >60.00)
Glucose, Bld: 246 mg/dL — ABNORMAL HIGH (ref 70–99)
Potassium: 3.5 mEq/L (ref 3.5–5.1)
Sodium: 140 mEq/L (ref 135–145)
TOTAL PROTEIN: 7.3 g/dL (ref 6.0–8.3)
Total Bilirubin: 0.3 mg/dL (ref 0.2–1.2)

## 2018-06-23 LAB — CBC WITH DIFFERENTIAL/PLATELET
Basophils Absolute: 0 10*3/uL (ref 0.0–0.1)
Basophils Relative: 0.6 % (ref 0.0–3.0)
Eosinophils Absolute: 0.2 10*3/uL (ref 0.0–0.7)
Eosinophils Relative: 2.3 % (ref 0.0–5.0)
HCT: 36.2 % (ref 36.0–46.0)
Hemoglobin: 11.3 g/dL — ABNORMAL LOW (ref 12.0–15.0)
Lymphocytes Relative: 34.9 % (ref 12.0–46.0)
Lymphs Abs: 2.5 10*3/uL (ref 0.7–4.0)
MCHC: 31.3 g/dL (ref 30.0–36.0)
MCV: 77.1 fl — ABNORMAL LOW (ref 78.0–100.0)
MONO ABS: 0.8 10*3/uL (ref 0.1–1.0)
Monocytes Relative: 10.9 % (ref 3.0–12.0)
Neutro Abs: 3.6 10*3/uL (ref 1.4–7.7)
Neutrophils Relative %: 51.3 % (ref 43.0–77.0)
Platelets: 313 10*3/uL (ref 150.0–400.0)
RBC: 4.69 Mil/uL (ref 3.87–5.11)
RDW: 18.3 % — ABNORMAL HIGH (ref 11.5–15.5)
WBC: 7.1 10*3/uL (ref 4.0–10.5)

## 2018-06-23 LAB — FERRITIN: FERRITIN: 115.3 ng/mL (ref 10.0–291.0)

## 2018-06-23 NOTE — Addendum Note (Signed)
Addended by: Larina Bras on: 06/23/2018 04:43 PM   Modules accepted: Orders

## 2018-06-23 NOTE — Progress Notes (Signed)
Subjective:    Patient ID: Kathy [name], female    DOB: April 01, 1956, 63 y.o.   MRN: [id number]  HPI Kathy [name] is a 63 year old female with a past medical history of Crohn's colitis with perianal involvement, hypertension, hyperlipidemia, diabetes and asthma who is here for follow-up.  She is here alone today and was seen on 04/28/2018.  She has been maintained on Humira 40 mg every 14 days and methotrexate 15 mg weekly was added approximately 2 months ago.  She reports that her biggest complaint continues to be low energy levels fatigue and generalized malaise.  She is also noticed quite a bit of hair loss which is concerning to her.  She reports feeling mildly depressed mood because of her decreased energy levels.  Her only activity primarily is going to the grocery store when this is required.  Her perianal disease, rectal pain, lower abdominal pain is significantly improved.  She did have diarrhea for the first time in many months late last week but this has resolved.  She does have some smaller bowel movements and then will have days where she will have more complete emptying with more larger bowel movement.  Her appetite tends to fluctuate and she has some mild early satiety.  No vomiting.  She did have a week where she felt like she had a viral infection in January.  Her last Humira dose was yesterday.  Her next methotrexate dose would be tomorrow.  By our scale her weight is 3 pounds up from her last visit.  She will get Medicare coverage on 07/26/2018   Review of Systems As per HPI, otherwise negative  Current Medications, Allergies, Past Medical History, Past Surgical History, Family History and Social History were reviewed in [employer] record.     Objective:   Physical Exam BP 124/72   Pulse 88   Ht 4' 11"  (1.499 m)   Wt 230 lb (104.3 kg)   BMI 46.45 kg/m  Constitutional: Well-developed and well-nourished. No distress. HEENT:  Normocephalic and atraumatic.  Conjunctivae are normal.  No scleral icterus. Neck: Neck supple. Trachea midline. Cardiovascular: Normal rate, regular rhythm and intact distal pulses.  Pulmonary/chest: Effort normal and breath sounds normal. No wheezing, rales or rhonchi. Abdominal: Soft, obese, nontender, nondistended. Bowel sounds active throughout.  Extremities: no clubbing, cyanosis, or edema Neurological: Alert and oriented to person place and time. Skin: Skin is warm and dry. Psychiatric: Normal mood and affect. Behavior is normal.     Assessment & Plan:  63 year old female with a past medical history of Crohn's colitis with perianal involvement, hypertension, hyperlipidemia, diabetes and asthma who is here for follow-up.   1.  Crohn's colitis with perianal involvement/perianal fistula -from a Crohn's perspective I feel that she is doing much better.  It is my opinion that the methotrexate while likely helping Crohn's is being poorly tolerated and probably contributing to the fatigue malaise and hair loss.  I also feel that the steroids which she was treated with for months have contributed to deconditioning, weight gain, hyperglycemia/worsening diabetes all of which is causing worsening fatigue/malaise.  Fortunately she is off prednisone.  I recommended the following --CBC and CMP today --Discontinue methotrexate due to poor tolerance/hair loss/fatigue and malaise --Continue Humira 40 mg every 14 days --Repeat MRI abdomen and pelvis to assess response to therapy but will delay this until April 2020 when she will have Medicare coverage --Follow-up with me shortly after MRI --Continue MiraLAX 17 g on an as-needed  basis  2.  GERD with history of esophagitis and gastritis --continue omeprazole 40 mg daily  3.  Fatigue/malaise/hyperglycemia --see #1.  She has been weaned off steroids but with the weight gain her glucose intolerance is worse.  I have encouraged her to work closely with  primary care to get her blood sugars under better control.  Hyperglycemia is causing overall worsening of energy levels.

## 2018-06-23 NOTE — Patient Instructions (Addendum)
If you are age 65 or older, your body mass index should be between 23-30. Your Body mass index is 46.45 kg/m. If this is out of the aforementioned range listed, please consider follow up with your Primary Care Provider.  If you are age 80 or younger, your body mass index should be between 19-25. Your Body mass index is 46.45 kg/m. If this is out of the aformentioned range listed, please consider follow up with your Primary Care Provider.   Please continue Humira, Omeprazole.  Take Miralax as needed for Constipation.  Your provider has requested that you go to the basement level for lab work before leaving today. Press "B" on the elevator. The lab is located at the first door on the left as you exit the elevator.  You have been scheduled for an MRI at [facility] on 08/09/2018        Your appointment time is 9:00 am. Please arrive 30 minutes prior to your appointment time for registration purposes. Please make certain not to have anything to eat or drink 4 hours prior to your test. In addition, if you have any metal in your body, have a pacemaker or defibrillator, please be sure to let your ordering physician know. This test typically takes 45 minutes to 1 hour to complete. Should you need to reschedule, please call [phone] to do so.  Please follow up with Dr. Hilarie Fredrickson in April.   Please discontinue methotrexate.    Your provider has requested that you go to the basement level for lab work 1 week prior to your MRI. Press "B" on the elevator. The lab is located at the first door on the left as you exit the elevator.

## 2018-07-07 ENCOUNTER — Encounter: Payer: Self-pay | Admitting: Internal Medicine

## 2018-08-09 ENCOUNTER — Ambulatory Visit (HOSPITAL_COMMUNITY): Admission: RE | Admit: 2018-08-09 | Payer: PPO | Source: Ambulatory Visit

## 2018-08-10 ENCOUNTER — Telehealth: Payer: Self-pay | Admitting: *Deleted

## 2018-08-10 DIAGNOSIS — K50119 Crohn's disease of large intestine with unspecified complications: Secondary | ICD-10-CM

## 2018-08-10 NOTE — Telephone Encounter (Signed)
Spoke to [place] in Radiology scheduling. Patient needs to be rescheduled to [facility] Radiology for MRI due to [facility] being made into COVID-19 hospital at this time. Will place new MR orders since previous one was incorrect.

## 2018-08-17 ENCOUNTER — Ambulatory Visit: Payer: Self-pay | Admitting: Internal Medicine

## 2018-09-12 ENCOUNTER — Telehealth: Payer: Self-pay | Admitting: Internal Medicine

## 2018-09-12 NOTE — Telephone Encounter (Signed)
Dr Hilarie Fredrickson confirms that patient should have MR entero abdomen and pelvis . Megan verbalizes understanding.

## 2018-09-12 NOTE — Telephone Encounter (Signed)
Megan from [facility] called needing to speak with nurse for clarification on the order that was sent in for the pt.

## 2018-09-13 ENCOUNTER — Ambulatory Visit (HOSPITAL_COMMUNITY)
Admission: RE | Admit: 2018-09-13 | Discharge: 2018-09-13 | Disposition: A | Discharge status: Home / Self Care | Payer: PPO | Source: Ambulatory Visit | Attending: Internal Medicine | Admitting: Internal Medicine

## 2018-09-13 ENCOUNTER — Other Ambulatory Visit: Payer: Self-pay

## 2018-09-13 ENCOUNTER — Ambulatory Visit (HOSPITAL_COMMUNITY): Admission: RE | Admit: 2018-09-13 | Payer: PPO | Source: Ambulatory Visit

## 2018-09-13 DIAGNOSIS — K529 Noninfective gastroenteritis and colitis, unspecified: Secondary | ICD-10-CM | POA: Diagnosis not present

## 2018-09-13 DIAGNOSIS — K50119 Crohn's disease of large intestine with unspecified complications: Secondary | ICD-10-CM

## 2018-09-13 LAB — POCT I-STAT CREATININE: Creatinine, Ser: 1.4 mg/dL — ABNORMAL HIGH (ref 0.44–1.00)

## 2018-09-13 MED ORDER — GADOBUTROL 1 MMOL/ML IV SOLN
10.0000 mL | Freq: Once | INTRAVENOUS | Status: AC | PRN
  Administered 2018-09-13: 10 mL via INTRAVENOUS

## 2018-10-09 ENCOUNTER — Encounter: Payer: Self-pay | Admitting: Internal Medicine

## 2018-10-09 ENCOUNTER — Other Ambulatory Visit: Payer: Self-pay

## 2018-10-09 ENCOUNTER — Ambulatory Visit (INDEPENDENT_AMBULATORY_CARE_PROVIDER_SITE_OTHER): Payer: PPO | Admitting: Internal Medicine

## 2018-10-09 ENCOUNTER — Other Ambulatory Visit (INDEPENDENT_AMBULATORY_CARE_PROVIDER_SITE_OTHER): Payer: PPO

## 2018-10-09 VITALS — Ht <= 58 in | Wt 230.0 lb

## 2018-10-09 DIAGNOSIS — R197 Diarrhea, unspecified: Secondary | ICD-10-CM

## 2018-10-09 DIAGNOSIS — K50119 Crohn's disease of large intestine with unspecified complications: Secondary | ICD-10-CM | POA: Diagnosis not present

## 2018-10-09 DIAGNOSIS — K2100 Gastro-esophageal reflux disease with esophagitis, without bleeding: Secondary | ICD-10-CM

## 2018-10-09 LAB — CBC WITH DIFFERENTIAL/PLATELET
Basophils Absolute: 0 10*3/uL (ref 0.0–0.1)
Basophils Relative: 0.4 % (ref 0.0–3.0)
Eosinophils Absolute: 0.1 10*3/uL (ref 0.0–0.7)
Eosinophils Relative: 1.8 % (ref 0.0–5.0)
HCT: 37 % (ref 36.0–46.0)
Hemoglobin: 11.8 g/dL — ABNORMAL LOW (ref 12.0–15.0)
Lymphocytes Relative: 28.3 % (ref 12.0–46.0)
Lymphs Abs: 2 10*3/uL (ref 0.7–4.0)
MCHC: 31.9 g/dL (ref 30.0–36.0)
MCV: 72 fl — ABNORMAL LOW (ref 78.0–100.0)
Monocytes Absolute: 1.2 10*3/uL — ABNORMAL HIGH (ref 0.1–1.0)
Monocytes Relative: 16.6 % — ABNORMAL HIGH (ref 3.0–12.0)
Neutro Abs: 3.8 10*3/uL (ref 1.4–7.7)
Neutrophils Relative %: 52.9 % (ref 43.0–77.0)
Platelets: 335 10*3/uL (ref 150.0–400.0)
RBC: 5.15 Mil/uL — ABNORMAL HIGH (ref 3.87–5.11)
RDW: 14 % (ref 11.5–15.5)
WBC: 7.1 10*3/uL (ref 4.0–10.5)

## 2018-10-09 LAB — COMPREHENSIVE METABOLIC PANEL
ALT: 7 U/L (ref 0–35)
AST: 8 U/L (ref 0–37)
Albumin: 3.5 g/dL (ref 3.5–5.2)
Alkaline Phosphatase: 73 U/L (ref 39–117)
BUN: 14 mg/dL (ref 6–23)
CO2: 30 mEq/L (ref 19–32)
Calcium: 9.4 mg/dL (ref 8.4–10.5)
Chloride: 99 mEq/L (ref 96–112)
Creatinine, Ser: 1.18 mg/dL (ref 0.40–1.20)
GFR: 55.59 mL/min — ABNORMAL LOW (ref >60.00)
Glucose, Bld: 154 mg/dL — ABNORMAL HIGH (ref 70–99)
Potassium: 3.7 mEq/L (ref 3.5–5.1)
Sodium: 139 mEq/L (ref 135–145)
Total Bilirubin: 0.3 mg/dL (ref 0.2–1.2)
Total Protein: 7.9 g/dL (ref 6.0–8.3)

## 2018-10-09 NOTE — Progress Notes (Signed)
Subjective:    Patient ID: Kathy [name], female    DOB: [birth year], 63 y.o.   MRN: [id number]  This service was provided via telemedicine.  Telephone encounter The patient was located at home The provider was located in provider's GI office. The patient did consent to this telephone visit and is aware of possible charges through their insurance for this visit.   The persons participating in this telemedicine service were the patient and I. Time spent on call: 21 minutes   HPI Kathy [name] is a 63 year old female with a past medical history of Crohn's colitis with perianal involvement, hypertension, hyperlipidemia, diabetes and asthma who is here for follow-up.  She was last seen in the office on 06/23/2018.  She has been maintained on Humira 40 mg every 14 days.  She was previously on methotrexate for a relative short period, but this was stopped in February 2020 due to intolerance with fatigue, malaise and hair loss.  She is also been off steroids since January or early February 2020.  On 09/13/2018 we performed an MR enterography of the abdomen and pelvis to evaluate her disease activity.  This showed no acute findings in the abdomen or pelvis and no evidence of active IBD or complication.  There were small uterine fibroids the largest being 2 cm.  (Prior MR pelvis in November 2019 before she was completely treated she had edema and mucosal hyperenhancement in the rectum and anus along with subtle fistulous tracts from the anus to intergluteal cleft).  She reports that after her last visit in stopping methotrexate she had a good month where she was having fairly regular bowel movements.  Much improved lower abdominal pain and no rectal pain.  Her hair loss improved and her energy levels also improved.  Then in April and May she was having intermittent, approximately 2 days a week lower abdominal discomfort with fecal urgency and occasional loose stool.  Now over the last 3 weeks  this has become a more daily problem.  She is having 6 stools per day on average which are loose with mucus and mixed with blood.  At times she seen small blood clots in her stool.  She has lower abdominal crampy discomfort before and during bowel movement.  She also has rectal spasm and discomfort after bowel movement.  She has not tried dicyclomine.  She has been on Humira 40 mg every 14 days and her next dose will be 10/12/2018.  Energy levels have waned some with the diarrhea.  She did have the MR enterography discussed above and she reports she had diarrhea after the enteric contrast and she wonders if that did not set off what she is currently experiencing.   Review of Systems As per HPI, otherwise negative  Current Medications, Allergies, Past Medical History, Past Surgical History, Family History and Social History were reviewed in [employer] record.     Objective:   Physical Exam No physical exam, virtual visit  CBC    Component Value Date/Time   WBC 7.1 10/09/2018 1049   RBC 5.15 (H) 10/09/2018 1049   HGB 11.8 (L) 10/09/2018 1049   HCT 37.0 10/09/2018 1049   PLT 335.0 10/09/2018 1049   MCV 72.0 (L) 10/09/2018 1049   MCH 22.6 (L) 10/03/2017 0649   MCHC 31.9 10/09/2018 1049   RDW 14.0 10/09/2018 1049   LYMPHSABS 2.0 10/09/2018 1049   MONOABS 1.2 (H) 10/09/2018 1049   EOSABS 0.1 10/09/2018 1049   BASOSABS 0.0 10/09/2018  1049   CMP     Component Value Date/Time   NA 139 10/09/2018 1049   K 3.7 10/09/2018 1049   CL 99 10/09/2018 1049   CO2 30 10/09/2018 1049   GLUCOSE 154 (H) 10/09/2018 1049   BUN 14 10/09/2018 1049   CREATININE 1.18 10/09/2018 1049   CALCIUM 9.4 10/09/2018 1049   PROT 7.9 10/09/2018 1049   ALBUMIN 3.5 10/09/2018 1049   AST 8 10/09/2018 1049   ALT 7 10/09/2018 1049   ALKPHOS 73 10/09/2018 1049   BILITOT 0.3 10/09/2018 1049   GFRNONAA 56 (L) 12/14/2017 1009   GFRAA 64 12/14/2017 1009   MR ABDOMEN AND PELVIS WITHOUT AND  WITH CONTRAST (MR ENTEROGRAPHY)   TECHNIQUE: Multiplanar, multisequence MRI of the abdomen and pelvis was performed both before and during bolus administration of intravenous contrast. Negative oral contrast VoLumen was given.   CONTRAST:  10 mL Gadavist   COMPARISON:  Pelvic MRI on 03/01/2018, and AP CT on 01/12/2018   FINDINGS: COMBINED FINDINGS FOR BOTH MR ABDOMEN AND PELVIS   Lower Chest: No acute findings.   Hepatobiliary: No hepatic masses identified. Gallbladder is unremarkable. No evidence of biliary ductal dilatation.   Pancreas:  No mass or inflammatory changes.   Spleen: Within normal limits in size and appearance.   Adrenals/Urinary Tract: No masses identified. A few tiny right renal cysts are noted. No evidence of hydronephrosis. Urinary bladder is unremarkable in appearance.   Stomach/Bowel: No evidence of bowel wall thickening, abnormal contrast enhancement, or dilatation. No evidence of mesenteric inflammatory changes, enteric fistula, or abnormal fluid collections. The terminal ileum is normal in appearance.   Vascular/Lymphatic: No pathologically enlarged lymph nodes. No abdominal aortic aneurysm.   Reproductive: A few small uterine fibroids are again seen, largest measuring 2 cm. Both ovaries are normal in appearance. No adnexal mass or free fluid identified.   Other:  None.   Musculoskeletal:  No suspicious bone lesions identified.   IMPRESSION: No acute findings in the abdomen or pelvis. No evidence of active inflammatory bowel disease or complication.   Stable small uterine fibroids, largest measuring 2 cm.     Electronically Signed   By: Earle Gell M.D.   On: 09/13/2018 15:55      Assessment & Plan:  63 year old female with a past medical history of Crohn's colitis with perianal involvement, hypertension, hyperlipidemia, diabetes and asthma who is here for follow-up.  1.  Crohn's colitis with perianal involvement --she has had  recurrent lower abdominal cramping and rectal pain.  Certainly could represent a flare of her known colonic and perianal Crohn's.  I would like to exclude an infection, specifically C. difficile.  Her labs are reassuring and that she is not having progressive anemia nor does she have a leukocytosis.  We discussed her labs today. --She will return the requested GI pathogen panel and C. difficile PCR --Her Humira level and antibody test are pending; await results --Continue Humira 40 mg every 14 days --She can resume dicyclomine 10 mg 3 times daily as needed for lower abdominal crampy discomfort --We may need to start prednisone and even consider flex sig to evaluate disease activity however await the above studies before making this decision  2.  GERD with history of esophagitis and gastritis --upper GI symptoms are controlled at present.  Continue omeprazole 40 mg daily  Follow-up in 3 to 4 weeks, sooner if necessary; further plans based on the above results

## 2018-10-09 NOTE — Patient Instructions (Addendum)
If you are age 65 or older, your body mass index should be between 23-30. Your Body mass index is 48.07 kg/m. If this is out of the aforementioned range listed, please consider follow up with your Primary Care Provider.  If you are age 37 or younger, your body mass index should be between 19-25. Your Body mass index is 48.07 kg/m. If this is out of the aformentioned range listed, please consider follow up with your Primary Care Provider.   GI pathogen panel and C. difficile PCR, CBC w/differential and CMP  Continue Humira 40 mg every 14 days  Resume dicyclomine 10 mg 3 times daily as needed for lower abdominal crampy discomfort  Continue omeprazole 40 mg daily  Follow-up in 3 to 4 weeks, follow up telephone visit on Tuesday, 11/14/2018 @3 :00  sooner if necessary

## 2018-10-10 ENCOUNTER — Other Ambulatory Visit: Payer: PPO

## 2018-10-10 DIAGNOSIS — R197 Diarrhea, unspecified: Secondary | ICD-10-CM | POA: Diagnosis not present

## 2018-10-11 ENCOUNTER — Telehealth: Payer: Self-pay | Admitting: *Deleted

## 2018-10-11 ENCOUNTER — Ambulatory Visit (INDEPENDENT_AMBULATORY_CARE_PROVIDER_SITE_OTHER): Payer: PPO | Admitting: Family Medicine

## 2018-10-11 ENCOUNTER — Other Ambulatory Visit: Payer: Self-pay

## 2018-10-11 ENCOUNTER — Encounter: Payer: Self-pay | Admitting: Family Medicine

## 2018-10-11 VITALS — BP 124/82 | HR 82 | Temp 98.1°F | Resp 17 | Ht <= 58 in | Wt 219.4 lb

## 2018-10-11 DIAGNOSIS — N183 Chronic kidney disease, stage 3 unspecified: Secondary | ICD-10-CM

## 2018-10-11 DIAGNOSIS — Z136 Encounter for screening for cardiovascular disorders: Secondary | ICD-10-CM | POA: Diagnosis not present

## 2018-10-11 DIAGNOSIS — Z0001 Encounter for general adult medical examination with abnormal findings: Secondary | ICD-10-CM

## 2018-10-11 DIAGNOSIS — Z0000 Encounter for general adult medical examination without abnormal findings: Secondary | ICD-10-CM

## 2018-10-11 DIAGNOSIS — K50811 Crohn's disease of both small and large intestine with rectal bleeding: Secondary | ICD-10-CM | POA: Diagnosis not present

## 2018-10-11 DIAGNOSIS — Z1239 Encounter for other screening for malignant neoplasm of breast: Secondary | ICD-10-CM | POA: Diagnosis not present

## 2018-10-11 DIAGNOSIS — E1165 Type 2 diabetes mellitus with hyperglycemia: Secondary | ICD-10-CM | POA: Diagnosis not present

## 2018-10-11 DIAGNOSIS — Z23 Encounter for immunization: Secondary | ICD-10-CM

## 2018-10-11 DIAGNOSIS — E2839 Other primary ovarian failure: Secondary | ICD-10-CM

## 2018-10-11 DIAGNOSIS — R197 Diarrhea, unspecified: Secondary | ICD-10-CM

## 2018-10-11 DIAGNOSIS — K50119 Crohn's disease of large intestine with unspecified complications: Secondary | ICD-10-CM

## 2018-10-11 LAB — POCT GLYCOSYLATED HEMOGLOBIN (HGB A1C): Hemoglobin A1C: 8.2 % — AB (ref 4.0–5.6)

## 2018-10-11 LAB — LIPID PANEL

## 2018-10-11 NOTE — Telephone Encounter (Signed)
I have spoken to patient to advise of negative c diff testing. Advised that we still have some pending tests but that we should schedule her for flex sig tentatively in case the GI pathogen panel comes back negative since she may require a course of steroid which would necessitate [place] evaluating disease activity. Patient is agreeable and has been scheduled for flexible sigmoidoscopy on 10/19/18 at 230pm. She has been given time/date/location/prep for procedure as well as advised that she will need care partner 59 or older to bring her, stay for procedure and take her home. Patient verbalizes understanding of this. In addition, she has been sent written instructions to her home address. Time given for questions.

## 2018-10-11 NOTE — Patient Instructions (Addendum)
We recommend that you schedule a mammogram for breast cancer screening. Typically, you do not need a referral to do this. Please contact a local imaging center to schedule your mammogram.  [facility] - [phone]  *ask for the Radiology Department [facility] ([place]) - [phone] or [phone] also ask to do bone density at the same time [facility] - [phone] [place] [phone] [facility] - [phone]  *ask for the [facility] - [phone]  *ask for the Radiology Department [facility] - [phone]  *ask for the [place] - [phone]     EXERCISE AND DIET:  We recommended that you start or continue a regular exercise program for good health. Regular exercise means any activity that makes your heart beat faster and makes you sweat.  We recommend exercising at least 30 minutes per day at least 3 days a week, preferably 4 or 5.  We also recommend a diet low in fat and sugar.  Inactivity, poor dietary choices and obesity can cause diabetes, heart attack, stroke, and kidney damage, among others.     ALCOHOL AND SMOKING:  Women should limit their alcohol intake to no more than 7 drinks/beers/glasses of wine (combined, not each!) per week. Moderation of alcohol intake to this level decreases your risk of breast cancer and liver damage. And of course, no recreational drugs are part of a healthy lifestyle.  And absolutely no smoking or even second hand smoke. Most people know smoking can cause heart and lung diseases, but did you know it also contributes to weakening of your bones? Aging of your skin?  Yellowing of your teeth and nails?   CALCIUM AND VITAMIN D:  Adequate intake of calcium and Vitamin D are recommended.  The recommendations for exact amounts of these supplements seem to change often, but generally speaking 1,000  mg of calcium (between diet and supplement) and 800 units of Vitamin D per day seems prudent. Certain women may benefit from higher intake of Vitamin D.  If you are among these women, your doctor will have told you during your visit.     PAP SMEARS:  Pap smears, to check for cervical cancer or precancers,  have traditionally been done yearly, although recent scientific advances have shown that most women can have pap smears less often.  However, every woman still should have a physical exam from her doctor every year. It will include a breast check, inspection of the vulva and vagina to check for abnormal growths or skin changes, a visual exam of the cervix, and then an exam to evaluate the size and shape of the uterus and ovaries.  And after 65 years of age, a rectal exam is indicated to check for rectal cancers. We will also provide age appropriate advice regarding health maintenance, like when you should have certain vaccines, screening for sexually transmitted diseases, bone density testing, colonoscopy, mammograms, etc.    MAMMOGRAMS:  All women over 15 years old should have a yearly mammogram. Many facilities now offer a "3D" mammogram, which may cost around $50 extra out of pocket. If possible,  we recommend you accept the option to have the 3D mammogram performed.  It both reduces the number of women who will be called back for extra views which then turn out to be normal, and it is better than the routine mammogram at detecting truly abnormal areas.  COLON CANCER SCREENING: Now recommend starting at age 50. At this time colonoscopy is not covered for routine screening until 50. There are take home tests that can be done between 45-49.    COLONOSCOPY:  Colonoscopy to screen for colon cancer is recommended for all women at age 20.  We know, you hate the idea of the prep.  We agree, BUT, having colon cancer and not knowing it is worse!!  Colon cancer so often starts as a polyp that can be seen and  removed at colonscopy, which can quite literally save your life!  And if your first colonoscopy is normal and you have no family history of colon cancer, most women don't have to have it again for 10 years.  Once every ten years, you can do something that may end up saving your life, right?  We will be happy to help you get it scheduled when you are ready.  Be sure to check your insurance coverage so you understand how much it will cost.  It may be covered as a preventative service at no cost, but you should check your particular policy.      If you have lab work done today you will be contacted with your lab results within the next 2 weeks.  If you have not heard from [place] then please contact us. The fastest way to get your results is to register for My Chart.   IF you received an x-ray today, you will receive an invoice from [facility]. Please contact [facility] at [phone] with questions or concerns regarding your invoice.   IF you received labwork today, you will receive an invoice from [facility]. Please contact [facility] at [phone] with questions or concerns regarding your invoice.   Our billing staff will not be able to assist you with questions regarding bills from these companies.  You will be contacted with the lab results as soon as they are available. The fastest way to get your results is to activate your My Chart account. Instructions are located on the last page of this paperwork. If you have not heard from [place] regarding the results in 2 weeks, please contact this office.

## 2018-10-11 NOTE — Telephone Encounter (Signed)
-----   Message from Jerene Bears, MD sent at 10/11/2018 12:16 PM EDT ----- C. difficile is negative, GI pathogen panel is pending Need to determine etiology for her symptoms, prednisone is hard on her but she may need it.  Probably best to pursue flexible sigmoidoscopy to evaluate disease activity before deciding about steroids Flex sig can be scheduled in the [place] if patient agreeable

## 2018-10-11 NOTE — Progress Notes (Signed)
QUICK REFERENCE INFORMATION:  The ABCs of Providing the Initial Preventive Physical Examination CMS.gov Medicare Learning Network  Welcome To Medicare Physical Kathy [name] is a 62 y.o. female who presents for a Welcome to Medicare exam. Additional concerns addressed today include:  HPI  Crohn's Pt currently experiencing blood diarrhea that she believes is a crohn's flare  She denies fevers or chills She had an appt with GI and left a stool sample for c.diff Results are pending  Uncontrolled diabetes Pt agrees that she is noncompliant and has not been checking her sugars  She states that she is taking the glipizide and feels like her sugars are high She needs an ophthalmologist referral She has not been checking her sugars Lab Results  Component Value Date   HGBA1C 10.8 (A) 05/19/2018      Patient Active Problem List   Diagnosis Date Noted  . Oral pharyngeal candidiasis 05/24/2018  . Healthcare maintenance 05/19/2018  . Crohn disease ([place]) 03/09/2018  . Diastolic dysfunction 09/32/6712  . Yeast infection 03/09/2018  . GERD (gastroesophageal reflux disease) 03/09/2018  . TRANSAMINASES, SERUM, ELEVATED 05/15/2010  . Dyspnea 07/17/2009  . UTI 06/10/2009  . CHEST PAIN UNSPECIFIED 06/03/2009  . FREQUENCY, URINARY 06/03/2009  . COMMON MIGRAINE 07/17/2008  . Diabetes mellitus, controlled ([place]) 06/01/2006  . Pain in limb 06/01/2006  . Hyperlipidemia 05/30/2006  . Essential hypertension 05/30/2006  . ASTHMA 05/30/2006    Past Medical History:  Diagnosis Date  . Asthma    09-23-2017  per pt has not done inhaler since May 2018, no insurance (QVAR bid and Ventolin prn)  . Diastolic dysfunction    per echo [id number]  grade 2 diastolic dysfunction , ef 60-65%  . Esophagitis   . GERD (gastroesophageal reflux disease)   . Hemorrhoids   . Hiatal hernia   . Hyperlipidemia    stopped meds due to side effects  . Hypertension    09-23-2017  per pt has takes bp meds  since May 2018 no insurance (losartan 137m qd, HCTZ 12.582mqd, and toprol 50 qd)  . Type 2 diabetes mellitus ([place]   09-23-2017 per pt has not taken diabetic meds since May 2018, no insurance (kombiglyze [id number])    Past Surgical History:  Procedure Laterality Date  . CARDIOVASCULAR STRESS TEST  07/29/2009   normal nuclear study w/ no ischemia/  normal LV function and wall motion,  ef 70%  . CESAREAN SECTION  1980s  . RECTAL BIOPSY N/A 10/03/2017   Procedure: POSSIBLE BIOPSY RECTAL;  Surgeon: WhiIleana RoupD;  Location: [facility];  Service: General;  Laterality: N/A;     Outpatient Medications Prior to Visit  Medication Sig Dispense Refill  . Adalimumab (HUMIRA PEN) 40 MG/0.4ML PNKT Inject into the skin every 14 (fourteen) days.    . aMarland Kitchenbuterol (PROVENTIL HFA;VENTOLIN HFA) 108 (90 Base) MCG/ACT inhaler Inhale 1 puff into the lungs every 6 (six) hours as needed.    . aMarland KitchenLODipine (NORVASC) 10 MG tablet Take 1 tablet (10 mg total) by mouth daily. 90 tablet 3  . dicyclomine (BENTYL) 10 MG capsule Take 1 capsule (10 mg total) by mouth 3 (three) times daily as needed (cramping). (Patient taking differently: Take 10 mg by mouth as needed (cramping). ) 90 capsule 2  . glipiZIDE (GLUCOTROL XL) 10 MG 24 hr tablet Take 1 tablet (10 mg total) by mouth daily with breakfast. 90 tablet 1  . hydrochlorothiazide (MICROZIDE) 12.5 MG capsule TAKE 1 CAPSULE BY MOUTH ONCE DAILY FOR  BLOOD PRESSURE 90 capsule 0  . metoprolol succinate (TOPROL-XL) 50 MG 24 hr tablet Take 1 tablet (50 mg total) by mouth daily. 90 tablet 3  . omeprazole (PRILOSEC) 40 MG capsule Take 1 capsule (40 mg total) by mouth daily. 90 capsule 3  . polyethylene glycol (MIRALAX / GLYCOLAX) packet Take 17 g by mouth as needed. 1 capful prn    . simvastatin (ZOCOR) 10 MG tablet Take 1 tablet (10 mg total) by mouth at bedtime. 90 tablet 3   No facility-administered medications prior to visit.    Allergies  Allergen Reactions  .  Lipitor [Atorvastatin Calcium]     Leg cramps   . Lisinopril Itching  . Pravachol [Pravastatin Sodium]   . Pravastatin Sodium Other (See Comments)    leg cramps    Family History  Problem Relation Age of Onset  . Hypertension Mother 41  . Stroke Mother 16  . Diabetes Maternal Aunt        s/p bilater amputation due to DM  . Asthma Father   . Colon cancer Neg Hx   . Esophageal cancer Neg Hx   . Stomach cancer Neg Hx   . Rectal cancer Neg Hx     Social History   Socioeconomic History  . Marital status: Married    Spouse name: Not on file  . Number of children: 2  . Years of education: Not on file  . Highest education level: Not on file  Occupational History  . Not on file  Social Needs  . Financial resource strain: Not on file  . Food insecurity    Worry: Not on file    Inability: Not on file  . Transportation needs    Medical: Not on file    Non-medical: Not on file  Tobacco Use  . Smoking status: Never Smoker  . Smokeless tobacco: Never Used  Substance and Sexual Activity  . Alcohol use: No    Frequency: Never  . Drug use: No  . Sexual activity: Not on file  Lifestyle  . Physical activity    Days per week: Not on file    Minutes per session: Not on file  . Stress: Not on file  Relationships  . Social Herbalist on phone: Not on file    Gets together: Not on file    Attends religious service: Not on file    Active member of club or organization: Not on file    Attends meetings of clubs or organizations: Not on file    Relationship status: Not on file  . Intimate partner violence    Fear of current or ex partner: Not on file    Emotionally abused: Not on file    Physically abused: Not on file    Forced sexual activity: Not on file  Other Topics Concern  . Not on file  Social History Narrative  . Not on file     Recent Hospitalizations? No  Current Medical Providers and Suppliers: [facility] Patient Care Team: Forrest Moron, MD as PCP -  General (Internal Medicine) Future Appointments  Date Time Provider [place]  11/14/2018  3:00 PM Pyrtle, [name], MD [facility]     Age-appropriate Screening Schedule: The list below includes current immunization status and future screening recommendations based on patient's age. Orders for these recommended tests are listed in the plan section. The patient has been provided with a written plan. Immunization History  Administered Date(s) Administered  .  Influenza,inj,Quad PF,6+ Mos 02/09/2018  . Td 04/08/2005   Health Maintenance  Topic Date Due  . OPHTHALMOLOGY EXAM  07/27/1963  . MAMMOGRAM  07/27/2003  . PAP SMEAR-Modifier  12/22/2010  . DEXA SCAN  07/27/2018  . PNA vac Low Risk Adult (1 of 2 - PCV13) 07/27/2018  . HEMOGLOBIN A1C  11/17/2018  . INFLUENZA VACCINE  11/25/2018  . FOOT EXAM  12/15/2018  . URINE MICROALBUMIN  05/20/2019  . TETANUS/TDAP  12/07/2023  . COLONOSCOPY  01/19/2028  . Hepatitis C Screening  Completed  . HIV Screening  Completed    Health Habits  Current exercise activities include: none Exercise: 0 times/week. Diet: in general, a "healthy" diet   due to Crohn's disease Alcohol: none  Depression Screen-PHQ2/9 completed today  Depression screen [facility] 2/9 10/11/2018 05/24/2018 05/19/2018 03/08/2018 12/14/2017  Decreased Interest 0 0 0 0 0  Down, Depressed, Hopeless 0 0 0 1 0  PHQ - 2 Score 0 0 0 1 0  Altered sleeping - 0 - 1 -  Tired, decreased energy - 2 - 3 -  Change in appetite - 1 - 1 -  Feeling bad or failure about yourself  - 0 - 0 -  Trouble concentrating - 0 - 0 -  Moving slowly or fidgety/restless - 0 - 0 -  Suicidal thoughts - 0 - 0 -  PHQ-9 Score - 3 - 6 -  Difficult doing work/chores - Not difficult at all - Not difficult at all -    Depression Severity and Treatment Recommendations:  0-4= None  5-9= Mild / Treatment: Support, educate to call if worse; return in one month  10-14= Moderate / Treatment: Support, watchful  waiting; Antidepressant or Psycotherapy  15-19= Moderately severe / Treatment: Antidepressant OR Psychotherapy  >= 20 = Major depression, severe / Antidepressant AND Psychotherapy  Functional Status Survey: Is the patient deaf or have difficulty hearing?: No Does the patient have difficulty seeing, even when wearing glasses/contacts?: No Does the patient have difficulty concentrating, remembering, or making decisions?: No Does the patient have difficulty walking or climbing stairs?: No Does the patient have difficulty dressing or bathing?: No Does the patient have difficulty doing errands alone such as visiting a doctor's office or shopping?: No  Safety Screen  Does the home have:  Rugs in the hallway: no Stairs in home: no  Handrails on the stairs: n/a Poor lighting: no    Hearing Evaluation  Do you have trouble hearing the television when others do not? no  Do you have to strain to hear/understand conversations? no    Falls Risk:  Does the patient need assistance with ambulation? no  Does the patient have a history of a fall in the last 90 days? no Is the patient at risk for falls? no Was the patient's timed "Get Up and Go Test" unsteady or longer than 30 seconds? No  Advanced Care Planning Patient has executed an Advance Directive: no  If no, patient was given the opportunity to execute an Advance Directive today? yes  Are the patient's advanced directives in [employer]? no  This patient has the ability to prepare an Advance Directive: yes Provider is willing to follow the patient's wishes: yes   Cognitive Assessment Does the patient have evidence of cognitive impairment? no The patient does not have evidence of a change in mood/affect, appearance,  speech, memory or motor skills. Minicog- score 5 Mini-Cog - 10/11/18 0909    Normal clock drawing test?  yes  How many words correct?  3       ROS See HPI as well for ROS  General:   No fever, chills or recent illness.  No change in weight Respiratory:                No cough or shortness of breath, wheezing Cardiovascular:        No chest pain or palpitations GI:                           No abdominal pain, dyspepsia or change in bowel habits, +2 weeks of bloody diarrhea believed to be due to a crohn's flare GU:    No dysuria or hesitancy    Objective:   Vitals:   10/11/18 0905  BP: 124/82  Pulse: 82  Resp: 17  Temp: 98.1 F (36.7 C)  TempSrc: Oral  SpO2: 98%  Weight: 219 lb 6.4 oz (99.5 kg)  Height: 4' 10"  (1.473 m)    Body mass index is 45.85 kg/m.   Hearing/Vision exam:  Hearing Screening   125Hz  250Hz  500Hz  1000Hz  2000Hz  3000Hz  4000Hz  6000Hz  8000Hz   Right ear:           Left ear:             Visual Acuity Screening   Right eye Left eye Both eyes  Without correction:     With correction: 20/20 20/30 20/20     Physical Exam  Physical Exam  Constitutional: Oriented to person, place, and time. Appears well-developed and well-nourished.  HENT:  Head: Normocephalic and atraumatic.  Eyes: Conjunctivae and EOM are normal.  Breast exam: with chaperone Symmetric, without skin changes, without nipple discharge or LAD, no palpable masses Cardiovascular: Normal rate, regular rhythm, normal heart sounds and intact distal pulses.  No murmur heard. Pulmonary/Chest: Effort normal and breath sounds normal. No stridor. No respiratory distress. Has no wheezes.  Abdomen: obese, non-distended, normoactive bs, soft, diffuse tenderness throughout, no palpable masses, no rebound or guarding Neurological: Is alert and oriented to person, place, and time.  Skin: Skin is warm. Capillary refill takes less than 2 seconds.  Psychiatric: Has a normal mood and affect. Behavior is normal. Judgment and thought content normal.   ECG: NSR, WITHOUT TWI OR LVH  Assessment:  Welcome To Medicare Exam     Plan:  During the course of the visit the patient was educated and counseled about appropriate screening  and preventive services including:  -  TD, PNEUMONIA VACCINES ORDERED TODAY -  MAMMOGRAM AND BONE DENSITY ORDERED TODAY. PATIENT TO CALL AND SET UP - ECG DONE TODAY -  REFERRAL PLACED FOR OPHTHALMOLOGY   Discussed the patient's BMI with her. The BMI BMI is not in the acceptable range; BMI management plan is completed    The following orders were placed at today's visit;  Jailin was seen today for annual exam and medication refill.  Diagnoses and all orders for this visit:  Welcome to Medicare preventive visit- Women's Health Maintenance Plan Advised monthly breast exam and annual mammogram Advised dental exam every six months Discussed stress management Discussed pap smear screening guidelines  Discussed adding exercise Discussed that this is a welcome to medicare visit so many of the other concerns on her problem visit will need to be addressed at a separate visit.   -     EKG 12-Lead -     DG Bone Density; Future  Screening for breast cancer -  MM Digital Screening  Uncontrolled type 2 diabetes mellitus with hyperglycemia ([place])- WILL CHECK LABS TODAY AND DISCUSS IN GREATER DETAIL IN ONE WEEK -     Ambulatory referral to Ophthalmology -     Pneumococcal polysaccharide vaccine 23-valent greater than or equal to 2yo subcutaneous/IM -     CMP14+EGFR -     Hemoglobin A1c -     Lipid panel -     Microalbumin, urine -     POCT glycosylated hemoglobin (Hb A1C) -     CBC  Need for Td vaccine -     Td vaccine greater than or equal to 7yo preservative free IM  Need for prophylactic vaccination against Streptococcus pneumoniae (pneumococcus) -     Pneumococcal polysaccharide vaccine 23-valent greater than or equal to 2yo subcutaneous/IM  Screening for heart disease  Estrogen deficiency -     DG Bone Density; Future  Crohn's disease of both small and large intestine with rectal bleeding ([place])- pt C. Diff pending, discussed that she should contact GI for worsening diarrhea,  will check hemoglobin today -     CBC     Return in about 1 week (around 10/18/2018) for Diabetes and to discuss labs .  Future Appointments  Date Time Provider [place]  11/14/2018  3:00 PM Pyrtle, [name], MD [facility]    Patient Instructions   We recommend that you schedule a mammogram for breast cancer screening. Typically, you do not need a referral to do this. Please contact a local imaging center to schedule your mammogram.  [facility] - [phone]  *ask for the Radiology Department [facility] ([place]) - [phone] or [phone] also ask to do bone density at the same time [facility] - [phone] [place] [phone] [facility] - [phone]  *ask for the [facility] - [phone]  *ask for the Radiology Department [facility] - [phone]  *ask for the [facility] - [phone]     EXERCISE AND DIET:  We recommended that you start or continue a regular exercise program for good health. Regular exercise means any activity that makes your heart beat faster and makes you sweat.  We recommend exercising at least 30 minutes per day at least 3 days a week, preferably 4 or 5.  We also recommend a diet low in fat and sugar.  Inactivity, poor dietary choices and obesity can cause diabetes, heart attack, stroke, and kidney damage, among others.     ALCOHOL AND SMOKING:  Women should limit their alcohol intake to no more than 7 drinks/beers/glasses of wine (combined, not each!) per week. Moderation of alcohol intake to this level decreases your risk of breast cancer and liver damage. And of course, no recreational drugs are part of a healthy lifestyle.  And absolutely no smoking or even second hand smoke. Most people know smoking can cause heart and lung diseases, but did you know it also contributes to  weakening of your bones? Aging of your skin?  Yellowing of your teeth and nails?   CALCIUM AND VITAMIN D:  Adequate intake of calcium and Vitamin D are recommended.  The recommendations for exact amounts of these supplements seem to change often, but generally speaking 1,000 mg of calcium (between diet and supplement) and 800 units of Vitamin D per day seems prudent. Certain women may benefit from higher intake of Vitamin D.  If you are among these women, your doctor will have told you during your visit.     PAP SMEARS:  Pap smears, to check for cervical cancer or precancers,  have traditionally been done yearly, although recent scientific advances have shown that most women can have pap smears less often.  However, every woman still should have a physical exam from her doctor every year. It will include a breast check, inspection of the vulva and vagina to check for abnormal growths or skin changes, a visual exam of the cervix, and then an exam to evaluate the size and shape of the uterus and ovaries.  And after 65 years of age, a rectal exam is indicated to check for rectal cancers. We will also provide age appropriate advice regarding health maintenance, like when you should have certain vaccines, screening for sexually transmitted diseases, bone density testing, colonoscopy, mammograms, etc.    MAMMOGRAMS:  All women over 89 years old should have a yearly mammogram. Many facilities now offer a "3D" mammogram, which may cost around $50 extra out of pocket. If possible,  we recommend you accept the option to have the 3D mammogram performed.  It both reduces the number of women who will be called back for extra views which then turn out to be normal, and it is better than the routine mammogram at detecting truly abnormal areas.     COLON CANCER SCREENING: Now recommend starting at age 58. At this time colonoscopy is not covered for routine screening until 50. There are take home tests that can be done  between 45-49.    COLONOSCOPY:  Colonoscopy to screen for colon cancer is recommended for all women at age 47.  We know, you hate the idea of the prep.  We agree, BUT, having colon cancer and not knowing it is worse!!  Colon cancer so often starts as a polyp that can be seen and removed at colonscopy, which can quite literally save your life!  And if your first colonoscopy is normal and you have no family history of colon cancer, most women don't have to have it again for 10 years.  Once every ten years, you can do something that may end up saving your life, right?  We will be happy to help you get it scheduled when you are ready.  Be sure to check your insurance coverage so you understand how much it will cost.  It may be covered as a preventative service at no cost, but you should check your particular policy.      If you have lab work done today you will be contacted with your lab results within the next 2 weeks.  If you have not heard from [place] then please contact us. The fastest way to get your results is to register for My Chart.   IF you received an x-ray today, you will receive an invoice from [facility] Radiology. Please contact [facility] Radiology at [phone] with questions or concerns regarding your invoice.   IF you received labwork today, you will receive an invoice from [place]. Please contact [facility] at [phone] with questions or concerns regarding your invoice.   Our billing staff will not be able to assist you with questions regarding bills from these companies.  You will be contacted with the lab results as soon as they are available. The fastest way to get your results is to activate your My Chart account. Instructions are located on the last page of this paperwork. If you have  not heard from [place] regarding the results in 2 weeks, please contact this office.       An after visit summary with all of these plans was given to the patient.

## 2018-10-12 LAB — CMP14+EGFR
ALT: 8 IU/L (ref 0–32)
AST: 11 IU/L (ref 0–40)
Albumin/Globulin Ratio: 0.9 — ABNORMAL LOW (ref 1.2–2.2)
Albumin: 3.5 g/dL — ABNORMAL LOW (ref 3.8–4.8)
Alkaline Phosphatase: 82 IU/L (ref 39–117)
BUN/Creatinine Ratio: 10 — ABNORMAL LOW (ref 12–28)
BUN: 14 mg/dL (ref 8–27)
Bilirubin Total: 0.3 mg/dL (ref 0.0–1.2)
CO2: 26 mmol/L (ref 20–29)
Calcium: 9.7 mg/dL (ref 8.7–10.3)
Chloride: 97 mmol/L (ref 96–106)
Creatinine, Ser: 1.34 mg/dL — ABNORMAL HIGH (ref 0.57–1.00)
GFR calc Af Amer: 48 mL/min/{1.73_m2} — ABNORMAL LOW (ref >59)
GFR calc non Af Amer: 42 mL/min/{1.73_m2} — ABNORMAL LOW (ref >59)
Globulin, Total: 3.9 g/dL (ref 1.5–4.5)
Glucose: 179 mg/dL — ABNORMAL HIGH (ref 65–99)
Potassium: 4.1 mmol/L (ref 3.5–5.2)
Sodium: 139 mmol/L (ref 134–144)
Total Protein: 7.4 g/dL (ref 6.0–8.5)

## 2018-10-12 LAB — CBC
Hematocrit: 36.7 % (ref 34.0–46.6)
Hemoglobin: 11.4 g/dL (ref 11.1–15.9)
MCH: 22.2 pg — ABNORMAL LOW (ref 26.6–33.0)
MCHC: 31.1 g/dL — ABNORMAL LOW (ref 31.5–35.7)
MCV: 72 fL — ABNORMAL LOW (ref 79–97)
Platelets: 350 10*3/uL (ref 150–450)
RBC: 5.13 x10E6/uL (ref 3.77–5.28)
RDW: 13.2 % (ref 11.7–15.4)
WBC: 8 10*3/uL (ref 3.4–10.8)

## 2018-10-12 LAB — GASTROINTESTINAL PATHOGEN PANEL PCR
C. difficile Tox A/B, PCR: NOT DETECTED
Campylobacter, PCR: NOT DETECTED
Cryptosporidium, PCR: NOT DETECTED
E coli (ETEC) LT/ST PCR: NOT DETECTED
E coli (STEC) stx1/stx2, PCR: NOT DETECTED
E coli 0157, PCR: NOT DETECTED
Giardia lamblia, PCR: NOT DETECTED
Norovirus, PCR: NOT DETECTED
Rotavirus A, PCR: NOT DETECTED
Salmonella, PCR: NOT DETECTED
Shigella, PCR: NOT DETECTED

## 2018-10-12 LAB — LIPID PANEL
Chol/HDL Ratio: 3.1 ratio (ref 0.0–4.4)
Cholesterol, Total: 107 mg/dL (ref 100–199)
HDL: 35 mg/dL — ABNORMAL LOW (ref >39)
LDL Calculated: 56 mg/dL (ref 0–99)
Triglycerides: 80 mg/dL (ref 0–149)
VLDL Cholesterol Cal: 16 mg/dL (ref 5–40)

## 2018-10-12 LAB — HEMOGLOBIN A1C
Est. average glucose Bld gHb Est-mCnc: 194 mg/dL
Hgb A1c MFr Bld: 8.4 % — ABNORMAL HIGH (ref 4.8–5.6)

## 2018-10-12 LAB — MICROALBUMIN, URINE: Microalbumin, Urine: 76.3 ug/mL

## 2018-10-12 LAB — CLOSTRIDIUM DIFFICILE TOXIN B, QUALITATIVE, REAL-TIME PCR: Toxigenic C. Difficile by PCR: NOT DETECTED

## 2018-10-12 NOTE — Addendum Note (Signed)
Addended by: Delia Chimes A on: 10/12/2018 12:21 PM   Modules accepted: Orders

## 2018-10-13 ENCOUNTER — Telehealth: Payer: Self-pay | Admitting: Internal Medicine

## 2018-10-13 MED ORDER — DIPHENOXYLATE-ATROPINE 2.5-0.025 MG PO TABS: 1.0000 | ORAL_TABLET | Freq: Four times a day (QID) | ORAL | 0 refills | Status: DC | PRN

## 2018-10-13 NOTE — Telephone Encounter (Signed)
Dr. Loletha Carrow, please see note below, I didn't realized Dr. Hilarie Fredrickson is off today:  This message has been routed to DOD

## 2018-10-13 NOTE — Telephone Encounter (Signed)
I reviewed Dr. Vena Rua recent note as well as a phone note from 2 days ago.  He did not want to start prednisone until he does the sigmoidoscopy next week.  I have sent a prescription to her pharmacy for Lomotil, start at one tablet (but can increase to two tablets) up to four times daily as needed for diarrhea, and to be used instead of (not in addition to ) the dicyclomine or Imodium.  If that does not control diarrhea enough for her to maintain adequate hydration, she should present to the emergency department.  There is a physician on call over the weekend if needed as well.

## 2018-10-13 NOTE — Telephone Encounter (Signed)
Spoke to the patient, notified the patient of Dr. Loletha Carrow' recommendations. The patient notified this RN that she would go pick up the prescription but if the diarrhea could not be controlled, she would report to the ED for evaluation. Nothing further at the time of the call.

## 2018-10-13 NOTE — Telephone Encounter (Signed)
Spoke to the patient who reports 3 weeks of daily watery, (intermittently bloody) and mucousy diarrhea. In the last 24 hours, the patient has had at least 10 episodes of diarrhea. The patient c/o extreme weakness and fatigue. She states that she can hardly bathe and perform other activities of daily living. She doesn't trust herself to drive. She endorses lightheadedness to the point of almost passing out. Decreased PO intake but is drinking water. She has taken off brand Imodium which has not helped, and Bentyl for intermittent RLQ/LLQ abd cramping. She states that she is not taking the medication q8hrs because she forgets when the last time she took it and doesn't want to overmedicate herself. Denies vomiting, endorses nausea, normal urine OP. Please advise.

## 2018-10-14 LAB — SERIAL MONITORING

## 2018-10-15 LAB — ADALIMUMAB+AB (SERIAL MONITOR)
Adalimumab Drug Level: 3.1 ug/mL
Anti-Adalimumab Antibody: <25 ng/mL

## 2018-10-16 ENCOUNTER — Other Ambulatory Visit: Payer: Self-pay

## 2018-10-16 MED ORDER — HUMIRA (2 PEN) 40 MG/0.4ML ~~LOC~~ AJKT: 40.0000 mg | AUTO-INJECTOR | SUBCUTANEOUS | 3 refills | Status: DC

## 2018-10-18 ENCOUNTER — Encounter: Payer: Self-pay | Admitting: Family Medicine

## 2018-10-18 ENCOUNTER — Telehealth: Payer: Self-pay | Admitting: Internal Medicine

## 2018-10-18 ENCOUNTER — Ambulatory Visit (INDEPENDENT_AMBULATORY_CARE_PROVIDER_SITE_OTHER): Payer: PPO | Admitting: Family Medicine

## 2018-10-18 ENCOUNTER — Other Ambulatory Visit: Payer: Self-pay

## 2018-10-18 VITALS — BP 118/70 | HR 85 | Temp 98.3°F | Resp 17 | Ht <= 58 in | Wt 215.8 lb

## 2018-10-18 DIAGNOSIS — K50811 Crohn's disease of both small and large intestine with rectal bleeding: Secondary | ICD-10-CM

## 2018-10-18 DIAGNOSIS — E1165 Type 2 diabetes mellitus with hyperglycemia: Secondary | ICD-10-CM | POA: Diagnosis not present

## 2018-10-18 LAB — GLUCOSE, POCT (MANUAL RESULT ENTRY): POC Glucose: 121 mg/dL — AB (ref 70–99)

## 2018-10-18 NOTE — Progress Notes (Signed)
Established Patient Office Visit  Subjective:  Patient ID: Kathy [name], female    DOB: Sep 10, 1955  Age: 63 y.o. MRN: [id number]  CC:  Chief Complaint  Patient presents with  . 1 week diabetes recheck and discuss labs    HPI Kathy [name] presents for   Diabetes Mellitus: Patient presents for follow up of diabetes. Her glucose is 120s fasting She has poor appetite due to Crohn's diarrhea She has no appetite Evaluation to date has been included: hemoglobin A1C.  Home sugars: BGs consistently in an acceptable range.  Lab Results  Component Value Date   HGBA1C 8.4 (H) 10/11/2018   Crohn's disease and Diarrhea She continues to have diarrhea every day  She will be going to [facility] tomorrow for her Crohn's flare Her c.diff is negative She has been drinking electrolyte solutions She is loosing weight from all the diarrhea She has gross blood per rectum with a few blood clots in her stool Wt Readings from Last 3 Encounters:  10/18/18 215 lb 12.8 oz (97.9 kg)  10/11/18 219 lb 6.4 oz (99.5 kg)  10/09/18 230 lb (104.3 kg)     Past Medical History:  Diagnosis Date  . Asthma    09-23-2017  per pt has not done inhaler since May 2018, no insurance (QVAR bid and Ventolin prn)  . Diastolic dysfunction    per echo [id number]  grade 2 diastolic dysfunction , ef 60-65%  . Esophagitis   . GERD (gastroesophageal reflux disease)   . Hemorrhoids   . Hiatal hernia   . Hyperlipidemia    stopped meds due to side effects  . Hypertension    09-23-2017  per pt has takes bp meds since May 2018 no insurance (losartan 112m qd, HCTZ 12.576mqd, and toprol 50 qd)  . Type 2 diabetes mellitus ([facility]   09-23-2017 per pt has not taken diabetic meds since May 2018, no insurance ([id number] [id number])    Past Surgical History:  Procedure Laterality Date  . CARDIOVASCULAR STRESS TEST  07/29/2009   normal nuclear study w/ no ischemia/  normal LV function and wall motion,  ef 70%  .  CESAREAN SECTION  1980s  . RECTAL BIOPSY N/A 10/03/2017   Procedure: POSSIBLE BIOPSY RECTAL;  Surgeon: WhiIleana RoupD;  Location: [facility];  Service: General;  Laterality: N/A;    Family History  Problem Relation Age of Onset  . Hypertension Mother 70 74 Stroke Mother 70 51 Diabetes Maternal Aunt        s/p bilater amputation due to DM  . Asthma Father   . Colon cancer Neg Hx   . Esophageal cancer Neg Hx   . Stomach cancer Neg Hx   . Rectal cancer Neg Hx     Social History   Socioeconomic History  . Marital status: Married    Spouse name: Not on file  . Number of children: 2  . Years of education: Not on file  . Highest education level: Not on file  Occupational History  . Not on file  Social Needs  . Financial resource strain: Not on file  . Food insecurity    Worry: Not on file    Inability: Not on file  . Transportation needs    Medical: Not on file    Non-medical: Not on file  Tobacco Use  . Smoking status: Never Smoker  . Smokeless tobacco: Never Used  Substance and Sexual Activity  . Alcohol use: No  Frequency: Never  . Drug use: No  . Sexual activity: Not on file  Lifestyle  . Physical activity    Days per week: Not on file    Minutes per session: Not on file  . Stress: Not on file  Relationships  . Social Herbalist on phone: Not on file    Gets together: Not on file    Attends religious service: Not on file    Active member of club or organization: Not on file    Attends meetings of clubs or organizations: Not on file    Relationship status: Not on file  . Intimate partner violence    Fear of current or ex partner: Not on file    Emotionally abused: Not on file    Physically abused: Not on file    Forced sexual activity: Not on file  Other Topics Concern  . Not on file  Social History Narrative  . Not on file    Outpatient Medications Prior to Visit  Medication Sig Dispense Refill  . Adalimumab (HUMIRA PEN) 40 MG/0.4ML  PNKT Inject into the skin every 14 (fourteen) days.    . Adalimumab (HUMIRA PEN) 40 MG/0.4ML PNKT Inject 40 mg into the skin every 7 (seven) days. 12 each 3  . albuterol (PROVENTIL HFA;VENTOLIN HFA) 108 (90 Base) MCG/ACT inhaler Inhale 1 puff into the lungs every 6 (six) hours as needed.    Marland Kitchen amLODipine (NORVASC) 10 MG tablet Take 1 tablet (10 mg total) by mouth daily. 90 tablet 3  . dicyclomine (BENTYL) 10 MG capsule Take 1 capsule (10 mg total) by mouth 3 (three) times daily as needed (cramping). (Patient taking differently: Take 10 mg by mouth as needed (cramping). ) 90 capsule 2  . diphenoxylate-atropine (LOMOTIL) 2.5-0.025 MG tablet Take 1 tablet by mouth 4 (four) times daily as needed for diarrhea or loose stools. 30 tablet 0  . glipiZIDE (GLUCOTROL XL) 10 MG 24 hr tablet Take 1 tablet (10 mg total) by mouth daily with breakfast. 90 tablet 1  . hydrochlorothiazide (MICROZIDE) 12.5 MG capsule TAKE 1 CAPSULE BY MOUTH ONCE DAILY FOR BLOOD PRESSURE 90 capsule 0  . metoprolol succinate (TOPROL-XL) 50 MG 24 hr tablet Take 1 tablet (50 mg total) by mouth daily. 90 tablet 3  . omeprazole (PRILOSEC) 40 MG capsule Take 1 capsule (40 mg total) by mouth daily. 90 capsule 3  . polyethylene glycol (MIRALAX / GLYCOLAX) packet Take 17 g by mouth as needed. 1 capful prn    . simvastatin (ZOCOR) 10 MG tablet Take 1 tablet (10 mg total) by mouth at bedtime. 90 tablet 3   No facility-administered medications prior to visit.     Allergies  Allergen Reactions  . Lipitor [Atorvastatin Calcium]     Leg cramps   . Lisinopril Itching  . Pravachol [Pravastatin Sodium]   . Pravastatin Sodium Other (See Comments)    leg cramps    ROS Review of Systems Review of Systems  Constitutional: Negative for activity change, appetite change, chills and fever.  HENT: Negative for congestion, nosebleeds, trouble swallowing and voice change.   Respiratory: Negative for cough, shortness of breath and wheezing.    Gastrointestinal: see hpi Genitourinary: Negative for difficulty urinating, dysuria, flank pain and hematuria.  Musculoskeletal: Negative for back pain, joint swelling and neck pain.  Neurological: occasional dizziness, no speech difficulty, occasional light-headedness, but no numbness.  See HPI. All other review of systems negative.     Objective:  Physical Exam  BP 118/70 (BP Location: Right Arm, Patient Position: Sitting, Cuff Size: Large)   Pulse 85   Temp 98.3 F (36.8 C) (Oral)   Resp 17   Ht 4' 10"  (1.473 m)   Wt 215 lb 12.8 oz (97.9 kg)   SpO2 97%   BMI 45.10 kg/m  Wt Readings from Last 3 Encounters:  10/18/18 215 lb 12.8 oz (97.9 kg)  10/11/18 219 lb 6.4 oz (99.5 kg)  10/09/18 230 lb (104.3 kg)   Physical Exam  Constitutional: Oriented to person, place, and time. Appears well-developed and well-nourished.  HENT:  Head: Normocephalic and atraumatic.  Eyes: Conjunctivae and EOM are normal.  Cardiovascular: Normal rate, regular rhythm, normal heart sounds and intact distal pulses.  No murmur heard. Pulmonary/Chest: Effort normal and breath sounds normal. No stridor. No respiratory distress. Has no wheezes.  Abdomen: distended, normoactive bs, soft, tender in the upper abdomen above the umbilicus Neurological: Is alert and oriented to person, place, and time.  Skin: Skin is warm. Capillary refill takes less than 2 seconds.  Psychiatric: Has a normal mood and affect. Behavior is normal. Judgment and thought content normal.    Health Maintenance Due  Topic Date Due  . OPHTHALMOLOGY EXAM  07/27/1963  . MAMMOGRAM  07/27/2003  . PAP SMEAR-Modifier  12/22/2010  . DEXA SCAN  07/27/2018  . PNA vac Low Risk Adult (1 of 2 - PCV13) 07/27/2018    There are no preventive care reminders to display for this patient.  Lab Results  Component Value Date   TSH 1.47 04/28/2010   Lab Results  Component Value Date   WBC 8.0 10/11/2018   HGB 11.4 10/11/2018   HCT 36.7  10/11/2018   MCV 72 (L) 10/11/2018   PLT 350 10/11/2018   Lab Results  Component Value Date   NA 139 10/11/2018   K 4.1 10/11/2018   CO2 26 10/11/2018   GLUCOSE 179 (H) 10/11/2018   BUN 14 10/11/2018   CREATININE 1.34 (H) 10/11/2018   BILITOT 0.3 10/11/2018   ALKPHOS 82 10/11/2018   AST 11 10/11/2018   ALT 8 10/11/2018   PROT 7.4 10/11/2018   ALBUMIN 3.5 (L) 10/11/2018   CALCIUM 9.7 10/11/2018   ANIONGAP 8 10/03/2017   GFR 55.59 (L) 10/09/2018   Lab Results  Component Value Date   CHOL 107 10/11/2018   Lab Results  Component Value Date   HDL 35 (L) 10/11/2018   Lab Results  Component Value Date   LDLCALC 56 10/11/2018   Lab Results  Component Value Date   TRIG 80 10/11/2018   Lab Results  Component Value Date   CHOLHDL 3.1 10/11/2018   Lab Results  Component Value Date   HGBA1C 8.4 (H) 10/11/2018      Assessment & Plan:   Problem List Items Addressed This Visit      Digestive   Crohn disease ([place])  - patient has follow up with GI on 10/19/2018 Continues to have diarrhea Await recommendations    Other Visit Diagnoses    Uncontrolled type 2 diabetes mellitus with hyperglycemia ([place])    -  Primary Discussed stopping glipizide if hypoglycemia occurs Continue hydration and fluids Glucose levels in a good range Weight loss noted   Relevant Orders   POCT glucose (manual entry) (Completed)      No orders of the defined types were placed in this encounter.   Follow-up: No follow-ups on file.    Forrest Moron, MD

## 2018-10-18 NOTE — Patient Instructions (Signed)
° ° ° °  If you have lab work done today you will be contacted with your lab results within the next 2 weeks.  If you have not heard from us then please contact us. The fastest way to get your results is to register for My Chart. ° ° °IF you received an x-ray today, you will receive an invoice from [facility]. Please contact [facility] at [phone] with questions or concerns regarding your invoice.  ° °IF you received labwork today, you will receive an invoice from [facility]. Please contact [facility] at [phone] with questions or concerns regarding your invoice.  ° °Our billing staff will not be able to assist you with questions regarding bills from these companies. ° °You will be contacted with the lab results as soon as they are available. The fastest way to get your results is to activate your My Chart account. Instructions are located on the last page of this paperwork. If you have not heard from us regarding the results in 2 weeks, please contact this office. °  ° ° ° °

## 2018-10-18 NOTE — Telephone Encounter (Signed)
Called patient and both phone#'s listed did not have a vmail. Will try again later today Covid-19 Screening Questions:  Do you now or have you had a fever in the last 14 days?   Do you have any respiratory symptoms of shortness of breath or cough now or in the last 14 days?   Do you have any family members or close contacts with diagnosed or suspected Covid-19 in the past 14 days?   Have you been tested for Covid-19 and found to be positive?

## 2018-10-19 ENCOUNTER — Encounter: Payer: Self-pay | Admitting: Internal Medicine

## 2018-10-19 ENCOUNTER — Ambulatory Visit (AMBULATORY_SURGERY_CENTER): Payer: PPO | Admitting: Internal Medicine

## 2018-10-19 ENCOUNTER — Other Ambulatory Visit: Payer: Self-pay

## 2018-10-19 VITALS — BP 114/50 | HR 72 | Temp 99.3°F | Resp 12 | Ht <= 58 in | Wt 215.0 lb

## 2018-10-19 DIAGNOSIS — K50119 Crohn's disease of large intestine with unspecified complications: Secondary | ICD-10-CM

## 2018-10-19 DIAGNOSIS — K529 Noninfective gastroenteritis and colitis, unspecified: Secondary | ICD-10-CM | POA: Diagnosis not present

## 2018-10-19 DIAGNOSIS — Z1211 Encounter for screening for malignant neoplasm of colon: Secondary | ICD-10-CM | POA: Diagnosis not present

## 2018-10-19 MED ORDER — SODIUM CHLORIDE 0.9 % IV SOLN: 500.0000 mL | Freq: Once | INTRAVENOUS | Status: DC

## 2018-10-19 MED ORDER — DEXTROSE 50 % IV SOLN
12.5000 g | Freq: Once | INTRAVENOUS | Status: AC
  Administered 2018-10-19 (×2): 12.5 g via INTRAVENOUS

## 2018-10-19 MED ORDER — DEXTROSE 50 % IV SOLN: 12.5000 g | Freq: Once | INTRAVENOUS | Status: DC

## 2018-10-19 NOTE — Progress Notes (Signed)
Pt's states no medical or surgical changes since previsit or office visit. 

## 2018-10-19 NOTE — Progress Notes (Signed)
[place]

## 2018-10-19 NOTE — Op Note (Signed)
[place] Patient Name: Kathy [name] Procedure Date: 10/19/2018 2:55 PM MRN: [id number] Endoscopist: Jerene Bears , MD Age: 63 Referring MD:  Date of Birth: [birth year] Gender: Female Account #: [id number] Procedure:                Flexible Sigmoidoscopy Indications:              Personal history of perianal Crohn's disease and                            Crohn's colitis; evaluate disease activity with                            recent diarrhea, lower abdominal pain on Humira 40                            mg every 14 days (did not tolerated MTX) Medicines:                Monitored Anesthesia Care Procedure:                Pre-Anesthesia Assessment:                           - Prior to the procedure, a History and Physical                            was performed, and patient medications and                            allergies were reviewed. The patient's tolerance of                            previous anesthesia was also reviewed. The risks                            and benefits of the procedure and the sedation                            options and risks were discussed with the patient.                            All questions were answered, and informed consent                            was obtained. Prior Anticoagulants: The patient has                            taken no previous anticoagulant or antiplatelet                            agents. ASA Grade Assessment: II - A patient with                            mild systemic disease. After reviewing the risks  and benefits, the patient was deemed in                            satisfactory condition to undergo the procedure.                           After obtaining informed consent, the scope was                            passed under direct vision. The Colonoscope was                            introduced through the anus and advanced to the                            sigmoid  colon. The flexible sigmoidoscopy was                            accomplished without difficulty. The patient                            tolerated the procedure well. The quality of the                            bowel preparation was adequate. Scope In: 2:58:29 PM Scope Out: 3:02:29 PM Total Procedure Duration: 0 hours 4 minutes 0 seconds  Findings:                 The perianal exam findings include anal canal                            stenosis.                           Inflammation characterized by adherent blood,                            altered vascularity, congestion (edema), erythema,                            friability, granularity and aphthous ulcerations                            was found in a continuous and circumferential                            pattern from the anus to the sigmoid colon. No                            sites were spared. This was severe, and when                            compared to previous examinations, the findings are  worsened. Biopsies were taken with a cold forceps                            for histology.                           Retroflexion in the rectum was not performed due to                            severe proctitis. Complications:            No immediate complications. Estimated Blood Loss:     Estimated blood loss was minimal. Impression:               - Anal canal stenosis found on perianal exam.                           - Crohn's disease with colonic involvement.                            Inflammation was found from the anus to the sigmoid                            colon. This was severe, worsened compared to                            previous examinations. Biopsied. Recommendation:           - Patient has a contact number available for                            emergencies. The signs and symptoms of potential                            delayed complications were discussed with the                             patient. Return to normal activities tomorrow.                            Written discharge instructions were provided to the                            patient.                           - Resume previous diet.                           - Continue present medications.                           - Humira recently increased to 40 mg every 7 days;                            continue.                           -  Begin prednisone 40 mg daily x 7 days, decrease                            by 5 mg every 7 days until off (8 week taper).                           - Await pathology results.                           - Office follow-up in about 1 month (sooner if                            needed). Jerene Bears, MD 10/19/2018 3:09:22 PM This report has been signed electronically.

## 2018-10-19 NOTE — Patient Instructions (Signed)
Rx called in for prednisone per Dr Vena Rua instruction.  Pick up at [facility] on [address].  YOU HAD AN ENDOSCOPIC PROCEDURE TODAY AT [facility]:   Refer to the procedure report that was given to you for any specific questions about what was found during the examination.  If the procedure report does not answer your questions, please call your gastroenterologist to clarify.  If you requested that your care partner not be given the details of your procedure findings, then the procedure report has been included in a sealed envelope for you to review at your convenience later.  YOU SHOULD EXPECT: Some feelings of bloating in the abdomen. Passage of more gas than usual.  Walking can help get rid of the air that was put into your GI tract during the procedure and reduce the bloating. If you had a lower endoscopy (such as a colonoscopy or flexible sigmoidoscopy) you may notice spotting of blood in your stool or on the toilet paper. If you underwent a bowel prep for your procedure, you may not have a normal bowel movement for a few days.  Please Note:  You might notice some irritation and congestion in your nose or some drainage.  This is from the oxygen used during your procedure.  There is no need for concern and it should clear up in a day or so.  SYMPTOMS TO REPORT IMMEDIATELY:   Following lower endoscopy (colonoscopy or flexible sigmoidoscopy):  Excessive amounts of blood in the stool  Significant tenderness or worsening of abdominal pains  Swelling of the abdomen that is new, acute  Fever of 100F or higher  For urgent or emergent issues, a gastroenterologist can be reached at any hour by calling [phone].   DIET:  We do recommend a small meal at first, but then you may proceed to your regular diet.  Drink plenty of fluids but you should avoid alcoholic beverages for 24 hours.  ACTIVITY:  You should plan to take it easy for the rest of today and you should NOT  DRIVE or use heavy machinery until tomorrow (because of the sedation medicines used during the test).    FOLLOW UP: Our staff will call the number listed on your records 48-72 hours following your procedure to check on you and address any questions or concerns that you may have regarding the information given to you following your procedure. If we do not reach you, we will leave a message.  We will attempt to reach you two times.  During this call, we will ask if you have developed any symptoms of COVID 19. If you develop any symptoms (ie: fever, flu-like symptoms, shortness of breath, cough etc.) before then, please call [phone].  If you test positive for Covid 19 in the 2 weeks post procedure, please call and report this information to [place].    If any biopsies were taken you will be contacted by phone or by letter within the next 1-3 weeks.  Please call us at [phone] if you have not heard about the biopsies in 3 weeks.    SIGNATURES/CONFIDENTIALITY: You and/or your care partner have signed paperwork which will be entered into your electronic medical record.  These signatures attest to the fact that that the information above on your After Visit Summary has been reviewed and is understood.  Full responsibility of the confidentiality of this discharge information lies with you and/or your care-partner.

## 2018-10-19 NOTE — Progress Notes (Signed)
To PACU, VSS. Report to [email or web address] 

## 2018-10-20 ENCOUNTER — Telehealth: Payer: Self-pay | Admitting: Internal Medicine

## 2018-10-20 MED ORDER — PREDNISONE 10 MG PO TABS: ORAL_TABLET | ORAL | 0 refills | Status: DC

## 2018-10-20 NOTE — Telephone Encounter (Signed)
I contacted patient and advised that I have sent prednisone in to her pharmacy. She is asked to let me know if there are any further problems with the prescription.

## 2018-10-23 ENCOUNTER — Telehealth: Payer: Self-pay | Admitting: *Deleted

## 2018-10-23 NOTE — Telephone Encounter (Signed)
1. Have you developed a fever since your procedure? no  2.   Have you had an respiratory symptoms (SOB or cough) since your procedure? no  3.   Have you tested positive for COVID 19 since your procedure no  4.   Have you had any family members/close contacts diagnosed with the COVID 19 since your procedure?  no   If yes to any of these questions please route to Joylene John, RN and Alphonsa Gin, Therapist, sports.  Follow up Call-  Call back number 10/19/2018 01/18/2018  Post procedure Call Back phone  # [phone]  Permission to leave phone message Yes Yes  Some recent data might be hidden     Patient questions:  Do you have a fever, pain , or abdominal swelling? No. Pain Score  0 *  Have you tolerated food without any problems? Yes.    Have you been able to return to your normal activities? Yes.    Do you have any questions about your discharge instructions: Diet   No. Medications  No. Follow up visit  No.  Do you have questions or concerns about your Care? No.  Actions: * If pain score is 4 or above: No action needed, pain <4.

## 2018-10-25 ENCOUNTER — Encounter: Payer: Self-pay | Admitting: Internal Medicine

## 2018-11-13 ENCOUNTER — Telehealth: Payer: Self-pay | Admitting: *Deleted

## 2018-11-13 NOTE — Telephone Encounter (Signed)
Covid-19 screening questions   Do you now or have you had a fever in the last 14 days? NO   Do you have any respiratory symptoms of shortness of breath or cough now or in the last 14 days? NO  Do you have any family members or close contacts with diagnosed or suspected Covid-19 in the past 14 days? NO  Have you been tested for Covid-19 and found to be positive? NO

## 2018-11-14 ENCOUNTER — Ambulatory Visit (INDEPENDENT_AMBULATORY_CARE_PROVIDER_SITE_OTHER): Payer: PPO | Admitting: Internal Medicine

## 2018-11-14 ENCOUNTER — Encounter: Payer: Self-pay | Admitting: Internal Medicine

## 2018-11-14 VITALS — BP 132/80 | HR 86 | Ht <= 58 in | Wt 213.0 lb

## 2018-11-14 DIAGNOSIS — K219 Gastro-esophageal reflux disease without esophagitis: Secondary | ICD-10-CM | POA: Diagnosis not present

## 2018-11-14 DIAGNOSIS — K50111 Crohn's disease of large intestine with rectal bleeding: Secondary | ICD-10-CM | POA: Diagnosis not present

## 2018-11-14 DIAGNOSIS — K6289 Other specified diseases of anus and rectum: Secondary | ICD-10-CM

## 2018-11-14 DIAGNOSIS — Z79899 Other long term (current) drug therapy: Secondary | ICD-10-CM

## 2018-11-14 DIAGNOSIS — K50119 Crohn's disease of large intestine with unspecified complications: Secondary | ICD-10-CM

## 2018-11-14 MED ORDER — TINIDAZOLE 250 MG PO TABS: 250.0000 mg | ORAL_TABLET | Freq: Three times a day (TID) | ORAL | 0 refills | Status: DC

## 2018-11-14 NOTE — Patient Instructions (Addendum)
We have sent the following medications to your pharmacy for you to pick up at your convenience: Tinidazole 250 mg three times daily x 7 days (we have given you additional tablets to keep on hand)  Continue prednisone taper.  Continue Humira once weekly.  Your provider has requested that you go to the basement level for lab work on Thursday, 11/16/18 BEFORE you take your Humira. Press "B" on the elevator. The lab is located at the first door on the left as you exit the elevator.   Please follow up with Dr Hilarie Fredrickson in the next 6-8 weeks.  If you are age 70 or older, your body mass index should be between 23-30. Your Body mass index is 44.52 kg/m. If this is out of the aforementioned range listed, please consider follow up with your Primary Care Provider.  If you are age 75 or younger, your body mass index should be between 19-25. Your Body mass index is 44.52 kg/m. If this is out of the aformentioned range listed, please consider follow up with your Primary Care Provider.

## 2018-11-16 ENCOUNTER — Encounter: Payer: Self-pay | Admitting: Internal Medicine

## 2018-11-16 ENCOUNTER — Other Ambulatory Visit (INDEPENDENT_AMBULATORY_CARE_PROVIDER_SITE_OTHER): Payer: PPO

## 2018-11-16 DIAGNOSIS — K50119 Crohn's disease of large intestine with unspecified complications: Secondary | ICD-10-CM | POA: Diagnosis not present

## 2018-11-16 LAB — COMPREHENSIVE METABOLIC PANEL
ALT: 19 U/L (ref 0–35)
AST: 11 U/L (ref 0–37)
Albumin: 3.7 g/dL (ref 3.5–5.2)
Alkaline Phosphatase: 95 U/L (ref 39–117)
BUN: 31 mg/dL — ABNORMAL HIGH (ref 6–23)
CO2: 31 mEq/L (ref 19–32)
Calcium: 9.9 mg/dL (ref 8.4–10.5)
Chloride: 97 mEq/L (ref 96–112)
Creatinine, Ser: 1.4 mg/dL — ABNORMAL HIGH (ref 0.40–1.20)
GFR: 45.62 mL/min — ABNORMAL LOW (ref >60.00)
Glucose, Bld: 178 mg/dL — ABNORMAL HIGH (ref 70–99)
Potassium: 3.9 mEq/L (ref 3.5–5.1)
Sodium: 137 mEq/L (ref 135–145)
Total Bilirubin: 0.4 mg/dL (ref 0.2–1.2)
Total Protein: 7.4 g/dL (ref 6.0–8.3)

## 2018-11-16 LAB — CBC WITH DIFFERENTIAL/PLATELET
Basophils Absolute: 0.1 10*3/uL (ref 0.0–0.1)
Basophils Relative: 0.7 % (ref 0.0–3.0)
Eosinophils Absolute: 0.1 10*3/uL (ref 0.0–0.7)
Eosinophils Relative: 0.6 % (ref 0.0–5.0)
HCT: 38 % (ref 36.0–46.0)
Hemoglobin: 12 g/dL (ref 12.0–15.0)
Lymphocytes Relative: 41.5 % (ref 12.0–46.0)
Lymphs Abs: 3.7 10*3/uL (ref 0.7–4.0)
MCHC: 31.5 g/dL (ref 30.0–36.0)
MCV: 71.1 fl — ABNORMAL LOW (ref 78.0–100.0)
Monocytes Absolute: 0.6 10*3/uL (ref 0.1–1.0)
Monocytes Relative: 7.1 % (ref 3.0–12.0)
Neutro Abs: 4.4 10*3/uL (ref 1.4–7.7)
Neutrophils Relative %: 50.1 % (ref 43.0–77.0)
Platelets: 234 10*3/uL (ref 150.0–400.0)
RBC: 5.35 Mil/uL — ABNORMAL HIGH (ref 3.87–5.11)
RDW: 18.2 % — ABNORMAL HIGH (ref 11.5–15.5)
WBC: 8.8 10*3/uL (ref 4.0–10.5)

## 2018-11-16 LAB — HIGH SENSITIVITY CRP: CRP, High Sensitivity: 11.15 mg/L — ABNORMAL HIGH (ref 0.000–5.000)

## 2018-11-16 NOTE — Progress Notes (Signed)
Subjective:    Patient ID: Kathy [name], female    DOB: 1955-08-31, 63 y.o.   MRN: [id number]  HPI Kathy [name] is a 63 year old female with a history of Crohn's colitis and perianal Crohn's, hypertension, hyperlipidemia, diabetes and asthma who is here for follow-up.  She was last seen by virtual visit on 10/09/2018.  She is here in person today.  At the time of her last visit she was having severe issues with her Crohn's disease including bloody diarrhea, perianal pain and abdominal pain.  She came for a flexible sigmoidoscopy on 10/19/2018.  This showed anal canal stenosis and inflammation which was severe from the anus to sigmoid.  Biopsies were consistent with moderately active chronic colitis.  No granulomas were seen.  We also discovered that her Humira level was low at 3.0 but she did not have antibodies.  We increased her Humira to 40 mg every 7 days.  I also started her on a prednisone taper at 40 mg decreasing by 5 mg every 7 days until off.  She is currently on 25 mg daily this week.  Her abdominal pain is better her diarrhea is also better.  She still having 4 stools a day.  She reports her colon never feels empty.  She does have problems with fecal smearing.  There is still perianal pain with bowel movement.  She does see bright red blood with wiping.  Dicyclomine seems to help.  Appetite is been okay.  No upper GI complaint today.  No fevers or chills.  Review of Systems As per HPI, otherwise negative  Current Medications, Allergies, Past Medical History, Past Surgical History, Family History and Social History were reviewed in [employer] record.     Objective:   Physical Exam BP 132/80   Pulse 86   Ht 4' 10"  (1.473 m)   Wt 213 lb (96.6 kg)   BMI 44.52 kg/m  Gen: awake, alert, NAD HEENT: anicteric, op clear CV: RRR, no mrg Pulm: CTA b/l Abd: soft, NT/ND, +BS throughout Ext: no c/c/e Neuro: nonfocal  CBC    Component Value  Date/Time   WBC 8.8 11/16/2018 0859   RBC 5.35 (H) 11/16/2018 0859   HGB 12.0 11/16/2018 0859   HGB 11.4 10/11/2018 1009   HCT 38.0 11/16/2018 0859   HCT 36.7 10/11/2018 1009   PLT 234.0 11/16/2018 0859   PLT 350 10/11/2018 1009   MCV 71.1 (L) 11/16/2018 0859   MCV 72 (L) 10/11/2018 1009   MCH 22.2 (L) 10/11/2018 1009   MCH 22.6 (L) 10/03/2017 0649   MCHC 31.5 11/16/2018 0859   RDW 18.2 (H) 11/16/2018 0859   RDW 13.2 10/11/2018 1009   LYMPHSABS 3.7 11/16/2018 0859   MONOABS 0.6 11/16/2018 0859   EOSABS 0.1 11/16/2018 0859   BASOSABS 0.1 11/16/2018 0859   CMP     Component Value Date/Time   NA 137 11/16/2018 0859   NA 139 10/11/2018 1009   K 3.9 11/16/2018 0859   CL 97 11/16/2018 0859   CO2 31 11/16/2018 0859   GLUCOSE 178 (H) 11/16/2018 0859   BUN 31 (H) 11/16/2018 0859   BUN 14 10/11/2018 1009   CREATININE 1.40 (H) 11/16/2018 0859   CALCIUM 9.9 11/16/2018 0859   PROT 7.4 11/16/2018 0859   PROT 7.4 10/11/2018 1009   ALBUMIN 3.7 11/16/2018 0859   ALBUMIN 3.5 (L) 10/11/2018 1009   AST 11 11/16/2018 0859   ALT 19 11/16/2018 0859   ALKPHOS 95 11/16/2018 0859  BILITOT 0.4 11/16/2018 0859   BILITOT 0.3 10/11/2018 1009   GFRNONAA 42 (L) 10/11/2018 1009   GFRAA 48 (L) 10/11/2018 1009       Assessment & Plan:  63 year old female with a history of Crohn's colitis and perianal Crohn's, hypertension, hyperlipidemia, diabetes and asthma who is here for follow-up.   1.  Crohn's colitis with perianal Crohn's disease and anal stenosis --she has had a recent severe flare of her Crohn's disease despite Humira.  Methotrexate which was previously used was definitively helping her Crohn's disease and she was reaching clinical remission but was having some intolerance and also hair loss from this medication.  It was stopped.  It is also notable that her Humira level was quite low at trough and so weekly dosing should help improve symptoms.  I would like to get her off prednisone, though  it seems to be helping.  I also would like to consider starting azathioprine given the benefit from methotrexate previously.  Hopefully this will be able to be tolerated.  We discussed the risks including the risk of leukopenia and hepatitis and the fact that she would need blood monitoring routinely.  We also discussed the idiosyncratic pancreatitis that can occur with this medication.  I need to check a TP MT level before starting it.  Today we will do the following: --CBC, CMP, CRP, Humira level at trough and TPMT --Continue Humira 40 mg every 7 days --Continue prednisone taper --Continue Bentyl as directed and as needed for abdominal pain --Tinnitus I will 250 mg 3 times daily x7 days; with the perianal inflammation I would like to ensure there is no infectious component and she responded to antibiotics previously --Office follow-up in 6 to 8 weeks  2.  GERD with history of esophagitis and gastritis --symptoms currently controlled, continue omeprazole 40 mg daily

## 2018-11-24 LAB — THIOPURINE METHYLTRANSFERASE (TPMT), RBC: Thiopurine Methyltransferase, RBC: 11 nmol/hr/mL RBC — ABNORMAL LOW

## 2018-11-25 LAB — SERIAL MONITORING

## 2018-11-26 LAB — ADALIMUMAB+AB (SERIAL MONITOR)
Adalimumab Drug Level: 6 ug/mL
Anti-Adalimumab Antibody: <25 ng/mL

## 2018-11-28 ENCOUNTER — Other Ambulatory Visit: Payer: Self-pay

## 2018-11-28 DIAGNOSIS — K50119 Crohn's disease of large intestine with unspecified complications: Secondary | ICD-10-CM

## 2018-11-28 MED ORDER — AZATHIOPRINE 50 MG PO TABS: 50.0000 mg | ORAL_TABLET | Freq: Every day | ORAL | 3 refills | Status: DC

## 2018-12-22 ENCOUNTER — Other Ambulatory Visit: Payer: Self-pay | Admitting: Family Medicine

## 2018-12-22 DIAGNOSIS — Z8679 Personal history of other diseases of the circulatory system: Secondary | ICD-10-CM

## 2018-12-25 ENCOUNTER — Other Ambulatory Visit (INDEPENDENT_AMBULATORY_CARE_PROVIDER_SITE_OTHER): Payer: PPO

## 2018-12-25 DIAGNOSIS — K50119 Crohn's disease of large intestine with unspecified complications: Secondary | ICD-10-CM

## 2018-12-25 LAB — COMPREHENSIVE METABOLIC PANEL
ALT: 19 U/L (ref 0–35)
AST: 14 U/L (ref 0–37)
Albumin: 3.5 g/dL (ref 3.5–5.2)
Alkaline Phosphatase: 90 U/L (ref 39–117)
BUN: 7 mg/dL (ref 6–23)
CO2: 32 mEq/L (ref 19–32)
Calcium: 9 mg/dL (ref 8.4–10.5)
Chloride: 104 mEq/L (ref 96–112)
Creatinine, Ser: 1.01 mg/dL (ref 0.40–1.20)
GFR: 66.48 mL/min (ref >60.00)
Glucose, Bld: 187 mg/dL — ABNORMAL HIGH (ref 70–99)
Potassium: 3.5 mEq/L (ref 3.5–5.1)
Sodium: 143 mEq/L (ref 135–145)
Total Bilirubin: 0.3 mg/dL (ref 0.2–1.2)
Total Protein: 7 g/dL (ref 6.0–8.3)

## 2018-12-25 LAB — CBC WITH DIFFERENTIAL/PLATELET
Basophils Absolute: 0 10*3/uL (ref 0.0–0.1)
Basophils Relative: 0.5 % (ref 0.0–3.0)
Eosinophils Absolute: 0.1 10*3/uL (ref 0.0–0.7)
Eosinophils Relative: 1.3 % (ref 0.0–5.0)
HCT: 38 % (ref 36.0–46.0)
Hemoglobin: 11.9 g/dL — ABNORMAL LOW (ref 12.0–15.0)
Lymphocytes Relative: 41.9 % (ref 12.0–46.0)
Lymphs Abs: 2.3 10*3/uL (ref 0.7–4.0)
MCHC: 31.3 g/dL (ref 30.0–36.0)
MCV: 73.9 fl — ABNORMAL LOW (ref 78.0–100.0)
Monocytes Absolute: 0.5 10*3/uL (ref 0.1–1.0)
Monocytes Relative: 9.3 % (ref 3.0–12.0)
Neutro Abs: 2.6 10*3/uL (ref 1.4–7.7)
Neutrophils Relative %: 47 % (ref 43.0–77.0)
Platelets: 262 10*3/uL (ref 150.0–400.0)
RBC: 5.13 Mil/uL — ABNORMAL HIGH (ref 3.87–5.11)
RDW: 21 % — ABNORMAL HIGH (ref 11.5–15.5)
WBC: 5.5 10*3/uL (ref 4.0–10.5)

## 2018-12-26 ENCOUNTER — Ambulatory Visit: Payer: PPO | Admitting: Internal Medicine

## 2019-01-05 ENCOUNTER — Other Ambulatory Visit: Payer: Self-pay | Admitting: Family Medicine

## 2019-01-05 DIAGNOSIS — E119 Type 2 diabetes mellitus without complications: Secondary | ICD-10-CM

## 2019-01-05 MED ORDER — GLIPIZIDE ER 10 MG PO TB24: 10.0000 mg | ORAL_TABLET | Freq: Every day | ORAL | 0 refills | Status: DC

## 2019-01-05 NOTE — Telephone Encounter (Signed)
Medication Refill - Medication: glipiZIDE (GLUCOTROL XL) 10 MG 24 hr tablet   Has the patient contacted their pharmacy? Yes.   (Agent: If no, request that the patient contact the pharmacy for the refill.) (Agent: If yes, when and what did the pharmacy advise?)  Preferred Pharmacy (with phone number or street name):   [facility]  [place] [address]  Phone: [phone] Fax: [phone]     Agent: Please be advised that RX refills may take up to 3 business days. We ask that you follow-up with your pharmacy.

## 2019-01-12 ENCOUNTER — Encounter: Payer: Self-pay | Admitting: Internal Medicine

## 2019-01-12 ENCOUNTER — Ambulatory Visit (INDEPENDENT_AMBULATORY_CARE_PROVIDER_SITE_OTHER): Payer: PPO | Admitting: Internal Medicine

## 2019-01-12 VITALS — BP 122/74 | HR 75 | Temp 97.4°F | Ht <= 58 in | Wt 222.0 lb

## 2019-01-12 DIAGNOSIS — K50119 Crohn's disease of large intestine with unspecified complications: Secondary | ICD-10-CM

## 2019-01-12 DIAGNOSIS — K50111 Crohn's disease of large intestine with rectal bleeding: Secondary | ICD-10-CM | POA: Diagnosis not present

## 2019-01-12 DIAGNOSIS — Z79899 Other long term (current) drug therapy: Secondary | ICD-10-CM | POA: Diagnosis not present

## 2019-01-12 NOTE — Progress Notes (Signed)
Subjective:    Patient ID: Kathy [name], female    DOB: Apr 19, 1956, 63 y.o.   MRN: [id number]  HPI Kathy [name] is a 63 year old female with a history of Crohn's colitis with perianal involvement, hypertension, hyperlipidemia, diabetes and asthma who is here for follow-up.  She was last seen on 11/14/2018.  At the time of her last visit she was experiencing a severe flare of her Crohn's colitis.  We had her on a prednisone taper.  We discovered that her Humira level was slightly low without antibodies despite q. 7-day dosing.  We increased her Humira to 40 mg every 5 days.  We also added azathioprine at 50 mg a day.  She has weaned off steroids entirely.  She reports that she still has rectal bleeding on a daily basis but not as bad as it was.  Seems to depend somewhat on bowel movement.  Bowel movements are occurring 1-2 times per day and with bowel movement she has some anal discomfort.  Bowel movements are mostly formed but can be hard or soft.  The diarrhea has stopped.  She does still have lower abdominal discomfort and is using dicyclomine only when this is severe.  Her appetite is been "so-so".  If stools get too hard she has used MiraLAX.  She is not needing Lomotil.  She is noticed some mild dizziness and wonders if this is related to azathioprine but this is tolerable.  She did 7 days of tinidazole therapy and she feels like this helped.  No fevers or chills.  No respiratory complaints.  Sleep is still not great if she wakes up early.  She feels much better off of prednisone.   Review of Systems As per HPI, otherwise negative  Current Medications, Allergies, Past Medical History, Past Surgical History, Family History and Social History were reviewed in [employer] record.      Objective:   Physical Exam BP 122/74   Pulse 75   Temp (!) 97.4 F (36.3 C)   Ht 4' 10"  (1.473 m)   Wt 222 lb (100.7 kg)   BMI 46.40 kg/m  Gen: awake, alert, NAD  HEENT: anicteric, op clear CV: RRR, no mrg Pulm: CTA b/l Abd: soft, obese NT/ND, +BS throughout Ext: no c/c, trace LE edema Neuro: nonfocal  CBC    Component Value Date/Time   WBC 5.5 12/25/2018 1152   RBC 5.13 (H) 12/25/2018 1152   HGB 11.9 (L) 12/25/2018 1152   HGB 11.4 10/11/2018 1009   HCT 38.0 12/25/2018 1152   HCT 36.7 10/11/2018 1009   PLT 262.0 12/25/2018 1152   PLT 350 10/11/2018 1009   MCV 73.9 (L) 12/25/2018 1152   MCV 72 (L) 10/11/2018 1009   MCH 22.2 (L) 10/11/2018 1009   MCH 22.6 (L) 10/03/2017 0649   MCHC 31.3 12/25/2018 1152   RDW 21.0 (H) 12/25/2018 1152   RDW 13.2 10/11/2018 1009   LYMPHSABS 2.3 12/25/2018 1152   MONOABS 0.5 12/25/2018 1152   EOSABS 0.1 12/25/2018 1152   BASOSABS 0.0 12/25/2018 1152   CMP     Component Value Date/Time   NA 143 12/25/2018 1152   NA 139 10/11/2018 1009   K 3.5 12/25/2018 1152   CL 104 12/25/2018 1152   CO2 32 12/25/2018 1152   GLUCOSE 187 (H) 12/25/2018 1152   BUN 7 12/25/2018 1152   BUN 14 10/11/2018 1009   CREATININE 1.01 12/25/2018 1152   CALCIUM 9.0 12/25/2018 1152   PROT 7.0 12/25/2018  1152   PROT 7.4 10/11/2018 1009   ALBUMIN 3.5 12/25/2018 1152   ALBUMIN 3.5 (L) 10/11/2018 1009   AST 14 12/25/2018 1152   ALT 19 12/25/2018 1152   ALKPHOS 90 12/25/2018 1152   BILITOT 0.3 12/25/2018 1152   BILITOT 0.3 10/11/2018 1009   GFRNONAA 42 (L) 10/11/2018 1009   GFRAA 48 (L) 10/11/2018 1009   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  1.  Crohn's colitis with perianal involvement and anal stenosis --she is doing better certainly now than she was 2 months ago.  I think it is slightly too soon to have seen the full benefit of azathioprine.  We debated repeating flexible sigmoidoscopy for disease assessment  but given her slow improvement even with withdrawal of steroids completely, we will give this a bit more time before making any additional intervention.  She is in agreement with this plan.  Fortunately her blood counts are good and there is no evidence of leukopenia or hepatitis with azathioprine.  Her TPMT was just slightly low so she may be a low metabolizer, either way we are using a low dose.  We do need to repeat labs today  --Continue Humira 40 mg every 5 days --Continue azathioprine 50 mg daily --Check CBC, CMP and thiopurine metabolite panel today --Advised that she can be more liberal with Bentyl 10 mg 3 times daily as this will likely help her lower abdominal crampy discomfort. --Tinidazole did help, but I do not think she needs this now --Office follow-up in about 6 more weeks  2. GERD --controlled on omeprazole.  Continue 40 mg daily  25 minutes spent with the patient today. Greater than 50% was spent in counseling and coordination of care with the patient

## 2019-01-12 NOTE — Patient Instructions (Addendum)
Continue Humira every 5 days.  Continue Azathioprine 50 mg daily.  Continue omeprazole.  Continue dicyclomine. You may take this more liberally.  Your provider has requested that you go to the basement level for lab work before leaving today. Press "B" on the elevator. The lab is located at the first door on the left as you exit the elevator.  Please follow up with Dr Hilarie Fredrickson in 6 weeks. We will call you when a schedule becomes available.  If you are age 76 or older, your body mass index should be between 23-30. Your Body mass index is 46.4 kg/m. If this is out of the aforementioned range listed, please consider follow up with your Primary Care Provider.  If you are age 49 or younger, your body mass index should be between 19-25. Your Body mass index is 46.4 kg/m. If this is out of the aformentioned range listed, please consider follow up with your Primary Care Provider.

## 2019-01-16 ENCOUNTER — Other Ambulatory Visit: Payer: Self-pay

## 2019-01-16 ENCOUNTER — Ambulatory Visit (INDEPENDENT_AMBULATORY_CARE_PROVIDER_SITE_OTHER): Payer: PPO | Admitting: Family Medicine

## 2019-01-16 ENCOUNTER — Encounter: Payer: Self-pay | Admitting: Family Medicine

## 2019-01-16 VITALS — BP 120/65 | HR 71 | Temp 98.5°F | Resp 17 | Ht <= 58 in | Wt 221.0 lb

## 2019-01-16 DIAGNOSIS — Z7952 Long term (current) use of systemic steroids: Secondary | ICD-10-CM

## 2019-01-16 DIAGNOSIS — Z23 Encounter for immunization: Secondary | ICD-10-CM

## 2019-01-16 DIAGNOSIS — K50811 Crohn's disease of both small and large intestine with rectal bleeding: Secondary | ICD-10-CM

## 2019-01-16 DIAGNOSIS — E1165 Type 2 diabetes mellitus with hyperglycemia: Secondary | ICD-10-CM

## 2019-01-16 DIAGNOSIS — Z5181 Encounter for therapeutic drug level monitoring: Secondary | ICD-10-CM

## 2019-01-16 LAB — POCT GLYCOSYLATED HEMOGLOBIN (HGB A1C): Hemoglobin A1C: 9.5 % — AB (ref 4.0–5.6)

## 2019-01-16 NOTE — Patient Instructions (Signed)
° ° ° °  If you have lab work done today you will be contacted with your lab results within the next 2 weeks.  If you have not heard from us then please contact us. The fastest way to get your results is to register for My Chart. ° ° °IF you received an x-ray today, you will receive an invoice from [facility]. Please contact [facility] at [phone] with questions or concerns regarding your invoice.  ° °IF you received labwork today, you will receive an invoice from [facility]. Please contact [facility] at [phone] with questions or concerns regarding your invoice.  ° °Our billing staff will not be able to assist you with questions regarding bills from these companies. ° °You will be contacted with the lab results as soon as they are available. The fastest way to get your results is to activate your My Chart account. Instructions are located on the last page of this paperwork. If you have not heard from us regarding the results in 2 weeks, please contact this office. °  ° ° ° °

## 2019-01-16 NOTE — Progress Notes (Signed)
Established Patient Office Visit  Subjective:  Patient ID: Kathy [name], female    DOB: 04/10/1956  Age: 63 y.o. MRN: [id number]  CC:  Chief Complaint  Patient presents with  . Diabetes    3 month recheck    HPI Kathy [name] presents for   Hyperglycemia and Diabetes Patient reports that she can tell when her blood sugar is elevated She reports that she has not seen any numbers that are too elevated, only a few above 130s She typically gets fasting readings 80-100s She denies any hypoglycemic episodes She was on prednisone from 6/28 to 8/22  She is on Glipizide 47m with breakfast and a diabetic diet She is on zocor but no ace inhibitor or arb Her last eye exam was over a year ago   Crohn's  She states that her crohn's acted up for the past 2-3 months and is now going into remission She states that she had blood bm  She had a sigmoidoscopy that showed anal canal stenosis Her last prednisone was a month ago to treat the inflammation from the anus to the sigmoid colon.    Past Medical History:  Diagnosis Date  . Asthma    09-23-2017  per pt has not done inhaler since May 2018, no insurance (QVAR bid and Ventolin prn)  . Diastolic dysfunction    per echo [phone] grade 2 diastolic dysfunction , ef 60-65%  . Esophagitis   . GERD (gastroesophageal reflux disease)   . Hemorrhoids   . Hiatal hernia   . Hyperlipidemia    stopped meds due to side effects  . Hypertension    09-23-2017  per pt has takes bp meds since May 2018 no insurance (losartan 1069mqd, HCTZ 12.44m344md, and toprol 50 qd)  . [place] 2 [place] [facility]  09-23-2017 per pt has not taken diabetic meds since May 2018, no insurance (kombiglyze 5-1000m43m)    Past Surgical History:  Procedure Laterality Date  . CARDIOVASCULAR STRESS TEST  07/29/2009   normal nuclear study w/ no ischemia/  normal LV function and wall motion,  ef 70%  . CESAREAN SECTION  1980s  . RECTAL BIOPSY N/A  10/03/2017   Procedure: POSSIBLE BIOPSY RECTAL;  Surgeon: WhitIleana Roup;  Location: [facility];  Service: General;  Laterality: N/A;    Family History  Problem Relation Age of Onset  . Hypertension Mother 70  43Stroke Mother 70  56Diabetes Maternal Aunt        s/p bilater amputation due to DM  . Asthma Father   . Colon cancer Neg Hx   . Esophageal cancer Neg Hx   . Stomach cancer Neg Hx   . Rectal cancer Neg Hx     Social History   Socioeconomic History  . Marital status: Married    Spouse name: Not on file  . Number of children: 2  . Years of education: Not on file  . Highest education level: Not on file  Occupational History  . Not on file  Social Needs  . Financial resource strain: Not on file  . Food insecurity    Worry: Not on file    Inability: Not on file  . Transportation needs    Medical: Not on file    Non-medical: Not on file  Tobacco Use  . Smoking status: Never Smoker  . Smokeless tobacco: Never Used  Substance and Sexual Activity  . Alcohol use: No    Frequency:  Never  . Drug use: No  . Sexual activity: Not on file  Lifestyle  . Physical activity    Days per week: Not on file    Minutes per session: Not on file  . Stress: Not on file  Relationships  . Social Herbalist on phone: Not on file    Gets together: Not on file    Attends religious service: Not on file    Active member of club or organization: Not on file    Attends meetings of clubs or organizations: Not on file    Relationship status: Not on file  . Intimate partner violence    Fear of current or ex partner: Not on file    Emotionally abused: Not on file    Physically abused: Not on file    Forced sexual activity: Not on file  Other Topics Concern  . Not on file  Social History Narrative  . Not on file    Outpatient Medications Prior to Visit  Medication Sig Dispense Refill  . Adalimumab (HUMIRA PEN) 40 MG/0.4ML PNKT Inject 40 mg into the skin every 7  (seven) days. (Patient taking differently: Inject 40 mg into the skin. Every 5 days) 12 each 3  . albuterol (PROVENTIL HFA;VENTOLIN HFA) 108 (90 Base) MCG/ACT inhaler Inhale 1 puff into the lungs every 6 (six) hours as needed.    Marland Kitchen amLODipine (NORVASC) 10 MG tablet Take 1 tablet (10 mg total) by mouth daily. 90 tablet 3  . azaTHIOprine (IMURAN) 50 MG tablet Take 1 tablet (50 mg total) by mouth daily. 30 tablet 3  . dicyclomine (BENTYL) 10 MG capsule Take 1 capsule (10 mg total) by mouth 3 (three) times daily as needed (cramping). (Patient taking differently: Take 10 mg by mouth as needed (cramping). ) 90 capsule 2  . diphenoxylate-atropine (LOMOTIL) 2.5-0.025 MG tablet Take 1 tablet by mouth 4 (four) times daily as needed for diarrhea or loose stools. 30 tablet 0  . glipiZIDE (GLUCOTROL XL) 10 MG 24 hr tablet Take 1 tablet (10 mg total) by mouth daily with breakfast. 90 tablet 0  . hydrochlorothiazide (MICROZIDE) 12.5 MG capsule TAKE 1 CAPSULE BY MOUTH ONCE DAILY FOR BLOOD PRESSURE 90 capsule 0  . metoprolol succinate (TOPROL-XL) 50 MG 24 hr tablet Take 1 tablet (50 mg total) by mouth daily. 90 tablet 3  . omeprazole (PRILOSEC) 40 MG capsule Take 1 capsule (40 mg total) by mouth daily. 90 capsule 3  . polyethylene glycol (MIRALAX / GLYCOLAX) packet Take 17 g by mouth as needed. 1 capful prn    . simvastatin (ZOCOR) 10 MG tablet Take 10 mg by mouth daily.     No facility-administered medications prior to visit.     Allergies  Allergen Reactions  . Lipitor [Atorvastatin Calcium]     Leg cramps   . Lisinopril Itching  . Pravachol [Pravastatin Sodium]   . Pravastatin Sodium Other (See Comments)    leg cramps    ROS Review of Systems    Review of Systems  Constitutional: Negative for activity change, appetite change, chills and fever.  HENT: Negative for congestion, nosebleeds, trouble swallowing and voice change.   Respiratory: Negative for cough, shortness of breath and wheezing.    Gastrointestinal: Negative for diarrhea, nausea and vomiting. Persistent blood per rectum Genitourinary: Negative for difficulty urinating, dysuria, flank pain and hematuria.  Musculoskeletal: Negative for back pain, joint swelling and neck pain.  Neurological: Negative for dizziness, speech difficulty, light-headedness and  numbness.  See HPI. All other review of systems negative.   Objective:    Physical Exam  BP 120/65 (BP Location: Left Arm, Patient Position: Sitting, Cuff Size: Large)   Pulse 71   Temp 98.5 F (36.9 C) (Oral)   Resp 17   Ht 4' 10"  (1.473 m)   Wt 221 lb (100.2 kg)   SpO2 100%   BMI 46.19 kg/m  Wt Readings from Last 3 Encounters:  01/16/19 221 lb (100.2 kg)  01/12/19 222 lb (100.7 kg)  11/14/18 213 lb (96.6 kg)   Physical Exam  Constitutional: Oriented to person, place, and time. Appears well-developed and well-nourished.  HENT:  Head: Normocephalic and atraumatic.  Eyes: Conjunctivae and EOM are normal.  Cardiovascular: Normal rate, regular rhythm, normal heart sounds and intact distal pulses.  No murmur heard. Pulmonary/Chest: Effort normal and breath sounds normal. No stridor. No respiratory distress. Has no wheezes.  Neurological: Is alert and oriented to person, place, and time.  Skin: Skin is warm. Capillary refill takes less than 2 seconds.  Psychiatric: Has a normal mood and affect. Behavior is normal. Judgment and thought content normal.    Health Maintenance Due  Topic Date Due  . OPHTHALMOLOGY EXAM  07/27/1963  . MAMMOGRAM  07/27/2003  . PAP SMEAR-Modifier  12/22/2010  . DEXA SCAN  07/27/2018  . PNA vac Low Risk Adult (1 of 2 - PCV13) 07/27/2018  . INFLUENZA VACCINE  11/25/2018  . FOOT EXAM  12/15/2018    There are no preventive care reminders to display for this patient.  Lab Results  Component Value Date   TSH 1.47 04/28/2010   Lab Results  Component Value Date   WBC 5.5 12/25/2018   HGB 11.9 (L) 12/25/2018   HCT 38.0  12/25/2018   MCV 73.9 (L) 12/25/2018   PLT 262.0 12/25/2018   Lab Results  Component Value Date   NA 143 12/25/2018   K 3.5 12/25/2018   CO2 32 12/25/2018   GLUCOSE 187 (H) 12/25/2018   BUN 7 12/25/2018   CREATININE 1.01 12/25/2018   BILITOT 0.3 12/25/2018   ALKPHOS 90 12/25/2018   AST 14 12/25/2018   ALT 19 12/25/2018   PROT 7.0 12/25/2018   ALBUMIN 3.5 12/25/2018   CALCIUM 9.0 12/25/2018   ANIONGAP 8 10/03/2017   GFR 66.48 12/25/2018   Lab Results  Component Value Date   CHOL 107 10/11/2018   Lab Results  Component Value Date   HDL 35 (L) 10/11/2018   Lab Results  Component Value Date   LDLCALC 56 10/11/2018   Lab Results  Component Value Date   TRIG 80 10/11/2018   Lab Results  Component Value Date   CHOLHDL 3.1 10/11/2018   Lab Results  Component Value Date   HGBA1C 9.5 (A) 01/16/2019      Assessment & Plan:   Problem List Items Addressed This Visit      Digestive   Crohn disease ([place])- currently stabilizing Continue with GI recs Discussed that if she needs systemic steroids again we should plan to correct her sugars with insulin while on steroids.  She is agreeable.     Other Visit Diagnoses    Need for prophylactic vaccination against Streptococcus pneumoniae (pneumococcus)    -  Primary   Uncontrolled type 2 diabetes mellitus with hyperglycemia ([place])    - likely due to high dose steroid for crohn's flare Advised her that in the future she will need sliding scale insulin while on prednisone  She should continue her current dose and follow up in 6 weeks   Relevant Orders   POCT glycosylated hemoglobin (Hb A1C) (Completed)   HM Diabetes Foot Exam (Completed)   Need for prophylactic vaccination and inoculation against influenza       Relevant Orders   Flu Vaccine QUAD High Dose(Fluad)   Encounter for monitoring of systemic steroid therapy    - advised pt to remain on the glipizide  Now that she is off steroid prednisone then her sugars should  normalize      No orders of the defined types were placed in this encounter.   Follow-up: No follow-ups on file.    Forrest Moron, MD

## 2019-02-02 ENCOUNTER — Ambulatory Visit: Payer: PPO | Admitting: Internal Medicine

## 2019-02-05 ENCOUNTER — Other Ambulatory Visit: Payer: Self-pay | Admitting: Internal Medicine

## 2019-02-13 ENCOUNTER — Telehealth: Payer: Self-pay | Admitting: Internal Medicine

## 2019-02-13 NOTE — Telephone Encounter (Signed)
Spoke with pt and gave her the phone number to call for her Humira assistance application, it is due for renewal. Pt given the phone number [phone] to call.

## 2019-02-22 ENCOUNTER — Other Ambulatory Visit: Payer: Self-pay

## 2019-02-22 DIAGNOSIS — I119 Hypertensive heart disease without heart failure: Secondary | ICD-10-CM

## 2019-02-22 NOTE — Telephone Encounter (Signed)
Pt would like to be notified if refill sent in.

## 2019-02-22 NOTE — Telephone Encounter (Signed)
Pt is requesting refill of Amlodipine.  Unable to find recent OV discussing plan for HTN or ongoing medication.  It was previously prescribed by another clinic.  Med has been pended.    Thank you,  Wilfred Curtis

## 2019-02-27 MED ORDER — AMLODIPINE BESYLATE 10 MG PO TABS: 10.0000 mg | ORAL_TABLET | Freq: Every day | ORAL | 3 refills | Status: DC

## 2019-02-27 NOTE — Telephone Encounter (Signed)
Medication has been refilled. Please notify the patient.

## 2019-02-27 NOTE — Addendum Note (Signed)
Addended by: Delia Chimes A on: 02/27/2019 01:47 PM   Modules accepted: Orders

## 2019-02-28 ENCOUNTER — Telehealth (INDEPENDENT_AMBULATORY_CARE_PROVIDER_SITE_OTHER): Payer: PPO | Admitting: Family Medicine

## 2019-02-28 ENCOUNTER — Encounter: Payer: Self-pay | Admitting: Family Medicine

## 2019-02-28 ENCOUNTER — Other Ambulatory Visit: Payer: Self-pay

## 2019-02-28 VITALS — Ht <= 58 in

## 2019-02-28 DIAGNOSIS — I10 Essential (primary) hypertension: Secondary | ICD-10-CM | POA: Diagnosis not present

## 2019-02-28 DIAGNOSIS — E1165 Type 2 diabetes mellitus with hyperglycemia: Secondary | ICD-10-CM | POA: Diagnosis not present

## 2019-02-28 DIAGNOSIS — Z8679 Personal history of other diseases of the circulatory system: Secondary | ICD-10-CM | POA: Diagnosis not present

## 2019-02-28 MED ORDER — POLYETHYLENE GLYCOL 3350 17 G PO PACK: 17.0000 g | PACK | ORAL | 11 refills | Status: AC | PRN

## 2019-02-28 MED ORDER — SIMVASTATIN 10 MG PO TABS: 10.0000 mg | ORAL_TABLET | Freq: Every day | ORAL | 3 refills | Status: DC

## 2019-02-28 MED ORDER — ALBUTEROL SULFATE HFA 108 (90 BASE) MCG/ACT IN AERS: 1.0000 | INHALATION_SPRAY | Freq: Four times a day (QID) | RESPIRATORY_TRACT | 3 refills | Status: DC | PRN

## 2019-02-28 MED ORDER — HYDROCHLOROTHIAZIDE 12.5 MG PO CAPS: ORAL_CAPSULE | ORAL | 1 refills | Status: DC

## 2019-02-28 MED ORDER — METOPROLOL SUCCINATE ER 50 MG PO TB24: 50.0000 mg | ORAL_TABLET | Freq: Every day | ORAL | 3 refills | Status: DC

## 2019-02-28 NOTE — Progress Notes (Signed)
DM check up  Med refills--Amlodipine

## 2019-02-28 NOTE — Patient Instructions (Signed)
° ° ° °  If you have lab work done today you will be contacted with your lab results within the next 2 weeks.  If you have not heard from us then please contact us. The fastest way to get your results is to register for My Chart. ° ° °IF you received an x-ray today, you will receive an invoice from [facility]. Please contact [facility] at [phone] with questions or concerns regarding your invoice.  ° °IF you received labwork today, you will receive an invoice from [facility]. Please contact [facility] at [phone] with questions or concerns regarding your invoice.  ° °Our billing staff will not be able to assist you with questions regarding bills from these companies. ° °You will be contacted with the lab results as soon as they are available. The fastest way to get your results is to activate your My Chart account. Instructions are located on the last page of this paperwork. If you have not heard from us regarding the results in 2 weeks, please contact this office. °  ° ° ° °

## 2019-02-28 NOTE — Progress Notes (Signed)
Telemedicine Encounter- SOAP NOTE Established Patient  This telephone encounter was conducted with the patient's (or proxy's) verbal consent via audio telecommunications: yes/no: Yes Patient was instructed to have this encounter in a suitably private space; and to only have persons present to whom they give permission to participate. In addition, patient identity was confirmed by use of name plus two identifiers (DOB and address).  I discussed the limitations, risks, security and privacy concerns of performing an evaluation and management service by telephone and the availability of in person appointments. I also discussed with the patient that there may be a patient responsible charge related to this service. The patient expressed understanding and agreed to proceed.  I spent a total of TIME; 0 MIN TO 60 MIN: 25 minutes talking with the patient or their proxy.  CC: Diabetes and hypertension   Subjective   Kathy [name] is a 63 y.o. established patient. Telephone visit today for  HPI  Diabetes Mellitus: Patient presents for follow up of diabetes. Symptoms: none. Symptoms have stabilized. Patient denies hypoglycemia , nausea, polydipsia and polyuria.  Evaluation to date has been included: hemoglobin A1C.  Home sugars: BGs consistently in an acceptable range.  Lab Results  Component Value Date   HGBA1C 9.5 (A) 01/16/2019    Her fasting glucose is between 100-120s She is having 2 BM a day. chornic blood per rectum related to her Crohn's  Hypertension: Patient here for follow-up of elevated blood pressure. She is not exercising and is adherent to low salt diet.  Blood pressure is well controlled at home. Cardiac symptoms none. Patient denies chest pain, chest pressure/discomfort, claudication, exertional chest pressure/discomfort, fatigue and irregular heart beat.    She ran out of her amlodipine. She states that since running out of the amlodipine.  She states that she is  compliant with all her medications. Amlodipine helps her bp and her headaches.  The reading was 143/84 this past week. BP Readings from Last 3 Encounters:  01/16/19 120/65  01/12/19 122/74  11/14/18 132/80    Patient Active Problem List   Diagnosis Date Noted  . Oral pharyngeal candidiasis 05/24/2018  . Healthcare maintenance 05/19/2018  . Crohn disease ([place]) 03/09/2018  . Diastolic dysfunction 78/29/5621  . Yeast infection 03/09/2018  . GERD (gastroesophageal reflux disease) 03/09/2018  . TRANSAMINASES, SERUM, ELEVATED 05/15/2010  . Dyspnea 07/17/2009  . UTI 06/10/2009  . CHEST PAIN UNSPECIFIED 06/03/2009  . FREQUENCY, URINARY 06/03/2009  . COMMON MIGRAINE 07/17/2008  . Diabetes mellitus, controlled ([place]) 06/01/2006  . Pain in limb 06/01/2006  . Hyperlipidemia 05/30/2006  . Essential hypertension 05/30/2006  . ASTHMA 05/30/2006    Past Medical History:  Diagnosis Date  . Asthma    09-23-2017  per pt has not done inhaler since May 2018, no insurance (QVAR bid and Ventolin prn)  . Diastolic dysfunction    per echo [id number]  grade 2 diastolic dysfunction , ef 60-65%  . Esophagitis   . GERD (gastroesophageal reflux disease)   . Hemorrhoids   . Hiatal hernia   . Hyperlipidemia    stopped meds due to side effects  . Hypertension    09-23-2017  per pt has takes bp meds since May 2018 no insurance (losartan 16m qd, HCTZ 12.531mqd, and toprol 50 qd)  . [place]   09-23-2017 per pt has not taken diabetic meds since May 2018, no insurance (kombiglyze [id number])    Current Outpatient Medications  Medication Sig Dispense Refill  .  Adalimumab (HUMIRA PEN) 40 MG/0.4ML PNKT Inject 40 mg into the skin every 7 (seven) days. (Patient taking differently: Inject 40 mg into the skin. Every 5 days) 12 each 3  . albuterol (VENTOLIN HFA) 108 (90 Base) MCG/ACT inhaler Inhale 1 puff into the lungs every 6 (six) hours as needed. 18 g 3  . azaTHIOprine (IMURAN) 50  MG tablet Take 1 tablet (50 mg total) by mouth daily. 30 tablet 3  . dicyclomine (BENTYL) 10 MG capsule Take 1 capsule (10 mg total) by mouth 3 (three) times daily as needed (cramping). (Patient taking differently: Take 10 mg by mouth as needed (cramping). ) 90 capsule 2  . diphenoxylate-atropine (LOMOTIL) 2.5-0.025 MG tablet Take 1 tablet by mouth 4 (four) times daily as needed for diarrhea or loose stools. 30 tablet 0  . glipiZIDE (GLUCOTROL XL) 10 MG 24 hr tablet Take 1 tablet (10 mg total) by mouth daily with breakfast. 90 tablet 0  . hydrochlorothiazide (MICROZIDE) 12.5 MG capsule TAKE 1 CAPSULE BY MOUTH ONCE DAILY FOR BLOOD PRESSURE 90 capsule 1  . metoprolol succinate (TOPROL-XL) 50 MG 24 hr tablet Take 1 tablet (50 mg total) by mouth daily. 90 tablet 3  . omeprazole (PRILOSEC) 40 MG capsule Take 1 capsule by mouth once daily 90 capsule 0  . polyethylene glycol (MIRALAX / GLYCOLAX) 17 g packet Take 17 g by mouth as needed. 1 capful prn 30 each 11  . simvastatin (ZOCOR) 10 MG tablet Take 1 tablet (10 mg total) by mouth daily. 90 tablet 3  . amLODipine (NORVASC) 10 MG tablet Take 1 tablet (10 mg total) by mouth daily. (Patient not taking: Reported on 02/28/2019) 90 tablet 3   No current facility-administered medications for this visit.     Allergies  Allergen Reactions  . Lipitor [Atorvastatin Calcium]     Leg cramps   . Lisinopril Itching  . Pravachol [Pravastatin Sodium]   . Pravastatin Sodium Other (See Comments)    leg cramps    Social History   Socioeconomic History  . Marital status: Married    Spouse name: Not on file  . Number of children: 2  . Years of education: Not on file  . Highest education level: Not on file  Occupational History  . Not on file  Social Needs  . Financial resource strain: Not on file  . Food insecurity    Worry: Not on file    Inability: Not on file  . Transportation needs    Medical: Not on file    Non-medical: Not on file  Tobacco Use   . Smoking status: Never Smoker  . Smokeless tobacco: Never Used  Substance and Sexual Activity  . Alcohol use: No    Frequency: Never  . Drug use: No  . Sexual activity: Not on file  Lifestyle  . Physical activity    Days per week: Not on file    Minutes per session: Not on file  . Stress: Not on file  Relationships  . Social Herbalist on phone: Not on file    Gets together: Not on file    Attends religious service: Not on file    Active member of club or organization: Not on file    Attends meetings of clubs or organizations: Not on file    Relationship status: Not on file  . Intimate partner violence    Fear of current or ex partner: Not on file    Emotionally abused: Not on  file    Physically abused: Not on file    Forced sexual activity: Not on file  Other Topics Concern  . Not on file  Social History Narrative  . Not on file    ROS Review of Systems  Constitutional: Negative for activity change, appetite change, chills and fever.  HENT: Negative for congestion, nosebleeds, trouble swallowing and voice change.   Respiratory: Negative for cough, shortness of breath and wheezing.   Gastrointestinal: Negative for diarrhea, nausea and vomiting.  Genitourinary: Negative for difficulty urinating, dysuria, flank pain and hematuria.  Musculoskeletal: Negative for back pain, joint swelling and neck pain.  Neurological: Negative for dizziness, speech difficulty, light-headedness and numbness.  See HPI. All other review of systems negative.   Objective   Vitals as reported by the patient: Today's Vitals   02/28/19 0806  Height: 4' 10"  (1.473 m)    Diagnoses and all orders for this visit:  Uncontrolled type 2 diabetes mellitus with hyperglycemia ([place]) - will assess control Last a1c and glucose were elevated Now fasting glucose has improved -     POCT glycosylated hemoglobin (Hb A1C); Future  History of diastolic dysfunction -     hydrochlorothiazide  (MICROZIDE) 12.5 MG capsule; TAKE 1 CAPSULE BY MOUTH ONCE DAILY FOR BLOOD PRESSURE  Essential hypertension - Patient's blood pressure is at goal of 139/89 or less. Condition is stable. Continue current medications and treatment plan. I recommend that you exercise for 30-45 minutes 5 days a week. I also recommend a balanced diet with fruits and vegetables every day, lean meats, and little fried foods. The DASH diet (you can find this online) is a good example of this.  -     metoprolol succinate (TOPROL-XL) 50 MG 24 hr tablet; Take 1 tablet (50 mg total) by mouth daily.  Other orders -     simvastatin (ZOCOR) 10 MG tablet; Take 1 tablet (10 mg total) by mouth daily. -     albuterol (VENTOLIN HFA) 108 (90 Base) MCG/ACT inhaler; Inhale 1 puff into the lungs every 6 (six) hours as needed. -     polyethylene glycol (MIRALAX / GLYCOLAX) 17 g packet; Take 17 g by mouth as needed. 1 capful prn     I discussed the assessment and treatment plan with the patient. The patient was provided an opportunity to ask questions and all were answered. The patient agreed with the plan and demonstrated an understanding of the instructions.   The patient was advised to call back or seek an in-person evaluation if the symptoms worsen or if the condition fails to improve as anticipated.  I provided 20 minutes of non-face-to-face time during this encounter.  Forrest Moron, MD  Primary Care at [facility]

## 2019-03-02 ENCOUNTER — Ambulatory Visit: Payer: PPO

## 2019-03-02 ENCOUNTER — Other Ambulatory Visit: Payer: Self-pay

## 2019-03-02 DIAGNOSIS — E1165 Type 2 diabetes mellitus with hyperglycemia: Secondary | ICD-10-CM

## 2019-03-02 LAB — POCT GLYCOSYLATED HEMOGLOBIN (HGB A1C): Hemoglobin A1C: 9.2 % — AB (ref 4.0–5.6)

## 2019-03-05 ENCOUNTER — Encounter: Payer: Self-pay | Admitting: Internal Medicine

## 2019-03-05 ENCOUNTER — Other Ambulatory Visit (INDEPENDENT_AMBULATORY_CARE_PROVIDER_SITE_OTHER): Payer: PPO

## 2019-03-05 ENCOUNTER — Telehealth: Payer: Self-pay | Admitting: Internal Medicine

## 2019-03-05 ENCOUNTER — Ambulatory Visit (INDEPENDENT_AMBULATORY_CARE_PROVIDER_SITE_OTHER): Payer: PPO | Admitting: Internal Medicine

## 2019-03-05 VITALS — BP 128/74 | HR 69 | Temp 98.4°F | Ht <= 58 in | Wt 222.0 lb

## 2019-03-05 DIAGNOSIS — K50119 Crohn's disease of large intestine with unspecified complications: Secondary | ICD-10-CM

## 2019-03-05 DIAGNOSIS — Z79899 Other long term (current) drug therapy: Secondary | ICD-10-CM | POA: Diagnosis not present

## 2019-03-05 DIAGNOSIS — K50111 Crohn's disease of large intestine with rectal bleeding: Secondary | ICD-10-CM | POA: Diagnosis not present

## 2019-03-05 DIAGNOSIS — K2100 Gastro-esophageal reflux disease with esophagitis, without bleeding: Secondary | ICD-10-CM | POA: Diagnosis not present

## 2019-03-05 LAB — CBC WITH DIFFERENTIAL/PLATELET
Basophils Absolute: 0 10*3/uL (ref 0.0–0.1)
Basophils Relative: 0.4 % (ref 0.0–3.0)
Eosinophils Absolute: 0.1 10*3/uL (ref 0.0–0.7)
Eosinophils Relative: 0.9 % (ref 0.0–5.0)
HCT: 38.7 % (ref 36.0–46.0)
Hemoglobin: 12.3 g/dL (ref 12.0–15.0)
Lymphocytes Relative: 21.1 % (ref 12.0–46.0)
Lymphs Abs: 1.7 10*3/uL (ref 0.7–4.0)
MCHC: 31.7 g/dL (ref 30.0–36.0)
MCV: 73.3 fl — ABNORMAL LOW (ref 78.0–100.0)
Monocytes Absolute: 0.9 10*3/uL (ref 0.1–1.0)
Monocytes Relative: 10.9 % (ref 3.0–12.0)
Neutro Abs: 5.2 10*3/uL (ref 1.4–7.7)
Neutrophils Relative %: 66.7 % (ref 43.0–77.0)
Platelets: 259 10*3/uL (ref 150.0–400.0)
RBC: 5.29 Mil/uL — ABNORMAL HIGH (ref 3.87–5.11)
RDW: 13.6 % (ref 11.5–15.5)
WBC: 7.9 10*3/uL (ref 4.0–10.5)

## 2019-03-05 LAB — COMPREHENSIVE METABOLIC PANEL
ALT: 13 U/L (ref 0–35)
AST: 14 U/L (ref 0–37)
Albumin: 4.1 g/dL (ref 3.5–5.2)
Alkaline Phosphatase: 105 U/L (ref 39–117)
BUN: 17 mg/dL (ref 6–23)
CO2: 31 mEq/L (ref 19–32)
Calcium: 9.7 mg/dL (ref 8.4–10.5)
Chloride: 99 mEq/L (ref 96–112)
Creatinine, Ser: 1.2 mg/dL (ref 0.40–1.20)
GFR: 54.45 mL/min — ABNORMAL LOW (ref >60.00)
Glucose, Bld: 193 mg/dL — ABNORMAL HIGH (ref 70–99)
Potassium: 3.8 mEq/L (ref 3.5–5.1)
Sodium: 138 mEq/L (ref 135–145)
Total Bilirubin: 0.3 mg/dL (ref 0.2–1.2)
Total Protein: 7.9 g/dL (ref 6.0–8.3)

## 2019-03-05 NOTE — Telephone Encounter (Signed)
Kathy [name] called and wanted to know if she can bring her Humira papers for assistance for [place] to fax for her. Let pt know this is fine and we can do this for her, no problem.

## 2019-03-05 NOTE — Progress Notes (Signed)
   Subjective:    Patient ID: Kathy [name], female    DOB: 1956-02-08, 63 y.o.   MRN: [id number]  HPI Kathy [name] is a 63 year old female with a history of Crohn's colitis with perianal involvement, hypertension, hyperlipidemia, diabetes and asthma is here for follow-up.  Last seen on 01/12/2019.  She is here alone today.  She is making slow improvement.  She continues Humira 40 mg every 5 days and azathioprine 50 mg daily.  She is using dicyclomine when she needs it for cramping spells in the abdomen.  This helped significantly.  Energy levels are improving and stools for the most part are formed.  She even needs MiraLAX on occasion to help stools be more regular.  She had one episode which she calls a "spell" last Thursday after eating a spinach salad with tomatoes and nuts for dinner.  She reports this normally does not bother her.  She had nausea vomiting followed by diarrhea.  She took Lomotil and the diarrhea improved.  She has had one episode of red blood in the stool and with wiping since her last visit.  Bleeding has not been as much of a problem of late.  Slowly she is feeling better every day.  She did not have her antihypertensive medication and last week developed elevated blood pressures at home and headache but her prescriptions have been refilled.   Review of Systems As per HPI, otherwise negative  Current Medications, Allergies, Past Medical History, Past Surgical History, Family History and Social History were reviewed in [employer] record.     Objective:   Physical Exam BP 128/74   Pulse 69   Temp 98.4 F (36.9 C)   Ht 4' 10"  (1.473 m)   Wt 222 lb (100.7 kg)   BMI 46.40 kg/m  Gen: awake, alert, NAD HEENT: anicteric, op clear CV: RRR, no mrg Pulm: CTA b/l Abd: soft, NT/ND, +BS throughout Ext: no c/c/e Neuro: nonfocal  Labs pending today       Assessment & Plan:  63 year old female with a history of Crohn's colitis with  perianal involvement, hypertension, hyperlipidemia, diabetes and asthma is here for follow-up.  Last seen on 01/12/2019.   1.  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  She is tolerating therapy and we have been able to achieve steroid free care over the last few months.  We have previously discussed repeat endoscopic evaluation to assess disease activity but for now we will continue current therapy and I would like to see her back in 3 months time. --Continue Humira 40 mg every 5 days --Continue azathioprine 50 mg daily --Check CBC, CMP and thiopurine metabolite panel today --Can use Bentyl as needed for crampy abdominal pain --Can use MiraLAX 17 g daily if needed --She already received influenza vaccination  2.  GERD with history of esophagitis --symptoms well controlled on omeprazole.  Continue 40 mg daily  25 minutes spent with the patient today. Greater than 50% was spent in counseling and coordination of care with the patient

## 2019-03-05 NOTE — Patient Instructions (Addendum)
If you are age 65 or older, your body mass index should be between 23-30. Your Body mass index is 46.4 kg/m. If this is out of the aforementioned range listed, please consider follow up with your Primary Care Provider.  If you are age 26 or younger, your body mass index should be between 19-25. Your Body mass index is 46.4 kg/m. If this is out of the aformentioned range listed, please consider follow up with your Primary Care Provider.   Your provider has requested that you go to the basement level for lab work before leaving today. Press "B" on the elevator. The lab is located at the first door on the left as you exit the elevator. (labs from last office visit)  Follow up in 3 months. Please call back in January 2021 to schedule.

## 2019-03-06 ENCOUNTER — Other Ambulatory Visit: Payer: Self-pay

## 2019-03-06 ENCOUNTER — Telehealth: Payer: Self-pay | Admitting: *Deleted

## 2019-03-06 DIAGNOSIS — K50111 Crohn's disease of large intestine with rectal bleeding: Secondary | ICD-10-CM

## 2019-03-06 NOTE — Telephone Encounter (Signed)
Address: [address], [place], [place] [address] Phone: [phone]

## 2019-03-06 NOTE — Telephone Encounter (Signed)
Pyrtle, [name], MD  Larina Bras, CMA        Kathy [name] needs referral to [facility] or Julien Girt for fibroids and routine GYN care

## 2019-03-08 NOTE — Telephone Encounter (Signed)
Patient has been scheduled to see Dr Marylynn Pearson on Thursday, 03/15/19 at 11:20 am with an 11:00 am arrival. Patient should wear a mask and come to the appointment alone.   I have spoken to patient and have advised of appointment. She verbalizes understanding.  Records have been faxed to fax [phone] attn:Dr Julien Girt.

## 2019-03-09 LAB — THIOPURINE METABOLITES
6 MMP(6-Methylmercaptopurine): <500 pmol/8x10(8)RBC (ref <5700)
6 TG(6-Thioguanine): 74 pmol/8x10(8)RBC — ABNORMAL LOW (ref 235–400)

## 2019-03-12 NOTE — Telephone Encounter (Signed)
Pt states she got a call from [employer] that they did not have her information for Humira assistance. Papers faxed again to [facility] and pt aware.

## 2019-03-12 NOTE — Telephone Encounter (Signed)
Pt calling and would like to speak with you about the Humira assistance program forms.

## 2019-03-29 ENCOUNTER — Telehealth: Payer: Self-pay | Admitting: Family Medicine

## 2019-03-29 NOTE — Telephone Encounter (Signed)
Copied from [place] [id number]. Topic: General - Other >> Mar 28, 2019  4:21 PM Ivar Drape wrote: Reason for CRM:   We were working on clearing up old referrals and ran across one for Dr. Delia Chimes that was sent on 10/13/2018 to [facility] for E11.65 (ICD-10-CM) - Uncontrolled type 2 diabetes mellitus with hyperglycemia ([place]) and N18.3 (ICD-10-CM) - Stage 3 chronic kidney disease.  On calling the patient, she said she didn't know about a referral.  She would like a return call from Dr. Nolon Rod to explain why she needs this referral.

## 2019-03-29 NOTE — Telephone Encounter (Signed)
Copied from [place] [id number]. Topic: General - Other >> Mar 28, 2019  4:21 PM Ivar Drape wrote: Reason for CRM:   We were working on clearing up old referrals and ran across one for Dr. Delia Chimes that was sent on 10/13/2018 to [facility] for E11.65 (ICD-10-CM) - Uncontrolled type 2 diabetes mellitus with hyperglycemia ([place]) and N18.3 (ICD-10-CM) - Stage 3 chronic kidney disease.  On calling the patient, she said she didn't know about a referral.  She would like a return call from Dr. Nolon Rod to explain why she needs this referral.

## 2019-03-29 NOTE — Telephone Encounter (Signed)
Please advise and I will call pt.

## 2019-04-01 ENCOUNTER — Other Ambulatory Visit: Payer: Self-pay | Admitting: Family Medicine

## 2019-04-01 ENCOUNTER — Other Ambulatory Visit: Payer: Self-pay | Admitting: Internal Medicine

## 2019-04-01 DIAGNOSIS — E119 Type 2 diabetes mellitus without complications: Secondary | ICD-10-CM

## 2019-04-05 ENCOUNTER — Telehealth: Payer: Self-pay | Admitting: Internal Medicine

## 2019-04-05 NOTE — Telephone Encounter (Signed)
Kathy [name] with [employer] called. They need the dx code for medication azathioprine, they are trying to see which part of Medicare will cover it.

## 2019-04-06 NOTE — Telephone Encounter (Signed)
I have given diagnosis code of Crohns k51.119, k51.111 for patient's azathioprine to [facility] with [employer].

## 2019-04-09 NOTE — Telephone Encounter (Signed)
Per Dr Danae Chen office, patient no showed appointment on 03/15/19. Patient indicates that she has no recollection of our previous conversation where we spoke about the appointment. Patient has been rescheduled for 04/12/19 at 10:30 am with a 10:15 am arrival and she has been informed of new date and time. She verbalizes understanding of this.

## 2019-04-09 NOTE — Progress Notes (Signed)
NO WAIVER SIGNED PLEASE WRITEOFF. DGADDY, CMA

## 2019-04-09 NOTE — Progress Notes (Signed)
NO WAIVER WAS SIGNED. PLEASE WRITEOFF

## 2019-04-10 ENCOUNTER — Other Ambulatory Visit: Payer: Self-pay

## 2019-04-10 ENCOUNTER — Telehealth: Payer: Self-pay

## 2019-04-10 MED ORDER — HUMIRA (2 PEN) 40 MG/0.4ML ~~LOC~~ AJKT: 40.0000 mg | AUTO-INJECTOR | SUBCUTANEOUS | 3 refills | Status: DC

## 2019-04-10 NOTE — Telephone Encounter (Signed)
-----   Message from Larina Bras, [place] sent at 04/09/2019  9:51 AM EST ----- [place]- I was talking to patient about appt with GYN... She wonders if you can talk to Humira Assistance about expiditing her Humira? She says she had to argue with them about whether they got the forms. Says you helped her so much and they finally told her they had forms and needed more info. She got the needed info to them but they tell her it will be at least another 7-10 business days. Patient states her last pen was used 12-5 and she is scared she is going to have a bad flare if she has to wait that long for another pen.

## 2019-04-10 NOTE — Telephone Encounter (Signed)
Spoke with pt and let her know [employer] sent a letter stating that pt is ineligible because she is enrolled with the medicare prescription extra help program and she should have a nominal copay with little or no deductible. Script sent to pharmacy for pt to see what her copay will be. Pt aware.

## 2019-04-12 DIAGNOSIS — R102 Pelvic and perineal pain: Secondary | ICD-10-CM | POA: Diagnosis not present

## 2019-04-12 DIAGNOSIS — Z6841 Body Mass Index (BMI) 40.0 and over, adult: Secondary | ICD-10-CM | POA: Diagnosis not present

## 2019-04-12 DIAGNOSIS — Z124 Encounter for screening for malignant neoplasm of cervix: Secondary | ICD-10-CM | POA: Diagnosis not present

## 2019-04-12 DIAGNOSIS — N950 Postmenopausal bleeding: Secondary | ICD-10-CM | POA: Diagnosis not present

## 2019-04-30 ENCOUNTER — Telehealth: Payer: Self-pay | Admitting: Internal Medicine

## 2019-04-30 ENCOUNTER — Other Ambulatory Visit: Payer: Self-pay | Admitting: Internal Medicine

## 2019-04-30 NOTE — Telephone Encounter (Signed)
Rx sent 

## 2019-05-03 ENCOUNTER — Telehealth: Payer: Self-pay | Admitting: Internal Medicine

## 2019-05-04 NOTE — Telephone Encounter (Signed)
Patient indicates that her azathioprine is needing a prior authorization. I spoke to [place] at [facility] back 04/05/19 to give information to for PA. I have left a voicemail for Gregary Signs to call me back to see if she can give me some insight as to what additionally needs to be done by our office to assist this patient in getting her medications.   Patient also indicates that she has been out of her Humira for over 1 month because she was denied by [place] patient assistance and her insurance has not paid. She is just now telling us that she has not had the medication in the amount of time. I advised her to never go this long without her medications as this could quickly cause her to go into a Crohns flare and even cause a hospitalization.   I have spoken to Rosanne Sack, RN who will try to work on the Humira portion to help patient as well.

## 2019-05-04 NOTE — Telephone Encounter (Signed)
I have spoken to patient to advise of this information and she is asked to contact pharmacy so she can arrange pick up of this medication immediately. She verbalizes understanding.

## 2019-05-15 ENCOUNTER — Other Ambulatory Visit: Payer: Self-pay | Admitting: Internal Medicine

## 2019-05-15 ENCOUNTER — Other Ambulatory Visit: Payer: Self-pay | Admitting: Gastroenterology

## 2019-05-15 NOTE — Telephone Encounter (Signed)
Looks like a refill request for lomotil on your patient came to me.

## 2019-05-15 NOTE — Telephone Encounter (Signed)
Ok to refill 

## 2019-05-17 ENCOUNTER — Other Ambulatory Visit: Payer: Self-pay | Admitting: Internal Medicine

## 2019-05-29 DIAGNOSIS — N950 Postmenopausal bleeding: Secondary | ICD-10-CM | POA: Diagnosis not present

## 2019-05-29 DIAGNOSIS — N958 Other specified menopausal and perimenopausal disorders: Secondary | ICD-10-CM | POA: Diagnosis not present

## 2019-05-29 DIAGNOSIS — Z1231 Encounter for screening mammogram for malignant neoplasm of breast: Secondary | ICD-10-CM | POA: Diagnosis not present

## 2019-06-07 ENCOUNTER — Other Ambulatory Visit (INDEPENDENT_AMBULATORY_CARE_PROVIDER_SITE_OTHER): Payer: PPO

## 2019-06-07 ENCOUNTER — Other Ambulatory Visit: Payer: Self-pay | Admitting: Obstetrics and Gynecology

## 2019-06-07 ENCOUNTER — Other Ambulatory Visit: Payer: Self-pay

## 2019-06-07 ENCOUNTER — Ambulatory Visit: Payer: PPO | Admitting: Internal Medicine

## 2019-06-07 ENCOUNTER — Encounter: Payer: Self-pay | Admitting: Internal Medicine

## 2019-06-07 VITALS — BP 126/64 | HR 79 | Temp 98.7°F | Ht 59.0 in | Wt 215.4 lb

## 2019-06-07 DIAGNOSIS — K219 Gastro-esophageal reflux disease without esophagitis: Secondary | ICD-10-CM

## 2019-06-07 DIAGNOSIS — K50119 Crohn's disease of large intestine with unspecified complications: Secondary | ICD-10-CM | POA: Diagnosis not present

## 2019-06-07 DIAGNOSIS — G4709 Other insomnia: Secondary | ICD-10-CM

## 2019-06-07 DIAGNOSIS — K50111 Crohn's disease of large intestine with rectal bleeding: Secondary | ICD-10-CM | POA: Diagnosis not present

## 2019-06-07 DIAGNOSIS — K2100 Gastro-esophageal reflux disease with esophagitis, without bleeding: Secondary | ICD-10-CM | POA: Diagnosis not present

## 2019-06-07 DIAGNOSIS — F4321 Adjustment disorder with depressed mood: Secondary | ICD-10-CM

## 2019-06-07 DIAGNOSIS — K51819 Other ulcerative colitis with unspecified complications: Secondary | ICD-10-CM

## 2019-06-07 DIAGNOSIS — K50919 Crohn's disease, unspecified, with unspecified complications: Secondary | ICD-10-CM

## 2019-06-07 DIAGNOSIS — R928 Other abnormal and inconclusive findings on diagnostic imaging of breast: Secondary | ICD-10-CM

## 2019-06-07 LAB — CBC
HCT: 38.3 % (ref 36.0–46.0)
Hemoglobin: 12.1 g/dL (ref 12.0–15.0)
MCHC: 31.5 g/dL (ref 30.0–36.0)
MCV: 71.5 fl — ABNORMAL LOW (ref 78.0–100.0)
Platelets: 347 10*3/uL (ref 150.0–400.0)
RBC: 5.36 Mil/uL — ABNORMAL HIGH (ref 3.87–5.11)
RDW: 16.3 % — ABNORMAL HIGH (ref 11.5–15.5)
WBC: 7.3 10*3/uL (ref 4.0–10.5)

## 2019-06-07 LAB — COMPREHENSIVE METABOLIC PANEL
ALT: 9 U/L (ref 0–35)
AST: 11 U/L (ref 0–37)
Albumin: 4 g/dL (ref 3.5–5.2)
Alkaline Phosphatase: 106 U/L (ref 39–117)
BUN: 17 mg/dL (ref 6–23)
CO2: 31 mEq/L (ref 19–32)
Calcium: 9.9 mg/dL (ref 8.4–10.5)
Chloride: 99 mEq/L (ref 96–112)
Creatinine, Ser: 1.46 mg/dL — ABNORMAL HIGH (ref 0.40–1.20)
GFR: 43.39 mL/min — ABNORMAL LOW (ref >60.00)
Glucose, Bld: 221 mg/dL — ABNORMAL HIGH (ref 70–99)
Potassium: 3.9 mEq/L (ref 3.5–5.1)
Sodium: 137 mEq/L (ref 135–145)
Total Bilirubin: 0.3 mg/dL (ref 0.2–1.2)
Total Protein: 8.8 g/dL — ABNORMAL HIGH (ref 6.0–8.3)

## 2019-06-07 NOTE — Progress Notes (Signed)
Subjective:    Patient ID: Kathy [name], female    DOB: [birth year], 64 y.o.   MRN: [id number]  HPI Kathy [name] is a 64 year old female with a history of Crohn's colitis with perianal involvement, hypertension, hyperlipidemia, diabetes and asthma who is here for follow-up.  She was last seen on 03/05/2019.  She is here alone today.  Unfortunately about a month after she was last seen there was issues with her Humira and azathioprine from an insurance perspective.  She went without her medications while waiting on prior authorization for nearly 6 weeks.  She reports her Humira "ran out" on 03/31/2019 and was not resumed until 05/13/2019.  Azathioprine was off for about a month resuming around 05/04/2019.  She is subsequently been using Humira 40 mg every 7 days with the last dose being 06/03/2019 and has been on azathioprine 50 mg daily.  About 2 to 3 weeks after her medications lapsed she developed recurrent diarrhea, moderate to severe abdominal pain worse after eating and blood with her bowel movements.  She now realizes how much of the medications had been controlling her disease.  She has felt depressed mood and unfortunately her sister died 2 days ago from complications of [id number].  This is the second relative who has died with the virus during the pandemic.  She is sleeping poorly maybe 2 hours per night.  She is using dicyclomine on schedule for abdominal pain but it does not help completely.  Eating worsens the pain and so she at times has avoided eating.  She was having diarrhea as many as 8-9 times per day with mucus and blood but with resuming the medications this has started to taper off.  Stools remain loose predominantly but much less frequently.  No fevers or chills.  No chest pain or shortness of breath.   Review of Systems As per HPI, otherwise negative  Current Medications, Allergies, Past Medical History, Past Surgical History, Family History and Social History were  reviewed in [facility] record.     Objective:   Physical Exam BP 126/64   Pulse 79   Temp 98.7 F (37.1 C)   Ht 4' 11"  (1.499 m)   Wt 215 lb 6.4 oz (97.7 kg)   BMI 43.51 kg/m  Gen: awake, alert, NAD HEENT: anicteric CV: RRR, no mrg Pulm: CTA b/l Abd: soft, diffusely tender without rebound, +BS throughout Ext: no c/c, trace pretibial edema Neuro: nonfocal  CBC    Component Value Date/Time   WBC 7.3 06/07/2019 1051   RBC 5.36 (H) 06/07/2019 1051   HGB 12.1 06/07/2019 1051   HGB 11.4 10/11/2018 1009   HCT 38.3 06/07/2019 1051   HCT 36.7 10/11/2018 1009   PLT 347.0 06/07/2019 1051   PLT 350 10/11/2018 1009   MCV 71.5 (L) 06/07/2019 1051   MCV 72 (L) 10/11/2018 1009   MCH 22.2 (L) 10/11/2018 1009   MCH 22.6 (L) 10/03/2017 0649   MCHC 31.5 06/07/2019 1051   RDW 16.3 (H) 06/07/2019 1051   RDW 13.2 10/11/2018 1009   LYMPHSABS 1.7 03/05/2019 1614   MONOABS 0.9 03/05/2019 1614   EOSABS 0.1 03/05/2019 1614   BASOSABS 0.0 03/05/2019 1614   CMP     Component Value Date/Time   NA 137 06/07/2019 1051   NA 139 10/11/2018 1009   K 3.9 06/07/2019 1051   CL 99 06/07/2019 1051   CO2 31 06/07/2019 1051   GLUCOSE 221 (H) 06/07/2019 1051   BUN 17 06/07/2019  1051   BUN 14 10/11/2018 1009   CREATININE 1.46 (H) 06/07/2019 1051   CALCIUM 9.9 06/07/2019 1051   PROT 8.8 (H) 06/07/2019 1051   PROT 7.4 10/11/2018 1009   ALBUMIN 4.0 06/07/2019 1051   ALBUMIN 3.5 (L) 10/11/2018 1009   AST 11 06/07/2019 1051   ALT 9 06/07/2019 1051   ALKPHOS 106 06/07/2019 1051   BILITOT 0.3 06/07/2019 1051   BILITOT 0.3 10/11/2018 1009   GFRNONAA 42 (L) 10/11/2018 1009   GFRAA 48 (L) 10/11/2018 1009       Assessment & Plan:  64 year old female with a history of Crohn's colitis with perianal involvement, hypertension, hyperlipidemia, diabetes and asthma who is here for follow-up.   1.  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  It does seem that symptoms are improving though slowly and we discussed a prednisone taper.  She would like to avoid prednisone given side effects in the past including weight gain, hyperglycemia and further sleep disruption. --Continue Humira 40 mg every 7 days --Continue azathioprine 50 mg a day --Dicyclomine and Lomotil per instruction as needed --Check Humira level and antibody today; we discussed that antibody formation is more common when therapy is disrupted and I want to ensure adequate level without antibody formation --8-week follow-up, sooner if needed --COVID-19 vaccination recommended  2.  GERD --remains well controlled, history of esophagitis.  Continue omeprazole 40 mg daily  3.  Need for COVID-19 vaccination --she has high risk based on her age but also Crohn's disease with immunosuppressive medication.  I recommended that she get a COVID-19 vaccination when possible and I gave her the number for [place] vaccination appointments.  4.  Grief with insomnia --unfortunately she lost her sister 2 days ago to COVID-19.  Her sister lived in [place].  She seems to be handling this as well as expected.  We discussed SSRI but she is not interested which I understand.  I recommended she try Tylenol PM per box instruction as needed for sleep.  40 minutes total spent today including patient facing time, coordination of care, reviewing medical history/procedures/pertinent radiology studies, and documentation of the encounter.

## 2019-06-07 NOTE — Patient Instructions (Addendum)
If you are age 66 or older, your body mass index should be between 23-30. Your Body mass index is 43.51 kg/m. If this is out of the aforementioned range listed, please consider follow up with your Primary Care Provider.  If you are age 108 or younger, your body mass index should be between 19-25. Your Body mass index is 43.51 kg/m. If this is out of the aformentioned range listed, please consider follow up with your Primary Care Provider.   Please continue: Humira every 7 days Omeprazole daily Azathioprine daily Dicyclomine and Lomotil as needed  Try Tylenol PM as directed for sleep  Return for a follow up in 8-12 weeks.

## 2019-06-16 LAB — SERIAL MONITORING

## 2019-06-19 ENCOUNTER — Ambulatory Visit
Admission: RE | Admit: 2019-06-19 | Discharge: 2019-06-19 | Disposition: A | Discharge status: Home / Self Care | Payer: PPO | Source: Ambulatory Visit | Attending: Obstetrics and Gynecology | Admitting: Obstetrics and Gynecology

## 2019-06-19 ENCOUNTER — Other Ambulatory Visit: Payer: Self-pay

## 2019-06-19 ENCOUNTER — Other Ambulatory Visit: Payer: Self-pay | Admitting: Obstetrics and Gynecology

## 2019-06-19 DIAGNOSIS — R921 Mammographic calcification found on diagnostic imaging of breast: Secondary | ICD-10-CM | POA: Diagnosis not present

## 2019-06-19 DIAGNOSIS — R928 Other abnormal and inconclusive findings on diagnostic imaging of breast: Secondary | ICD-10-CM

## 2019-06-21 ENCOUNTER — Ambulatory Visit
Admission: RE | Admit: 2019-06-21 | Discharge: 2019-06-21 | Disposition: A | Discharge status: Home / Self Care | Payer: PPO | Source: Ambulatory Visit | Attending: Obstetrics and Gynecology | Admitting: Obstetrics and Gynecology

## 2019-06-21 ENCOUNTER — Other Ambulatory Visit: Payer: Self-pay

## 2019-06-21 ENCOUNTER — Other Ambulatory Visit: Payer: Self-pay | Admitting: Diagnostic Radiology

## 2019-06-21 DIAGNOSIS — R921 Mammographic calcification found on diagnostic imaging of breast: Secondary | ICD-10-CM | POA: Diagnosis not present

## 2019-06-21 DIAGNOSIS — N6489 Other specified disorders of breast: Secondary | ICD-10-CM | POA: Diagnosis not present

## 2019-06-21 DIAGNOSIS — R920 Mammographic microcalcification found on diagnostic imaging of breast: Secondary | ICD-10-CM | POA: Diagnosis not present

## 2019-06-21 LAB — ADALIMUMAB+AB (SERIAL MONITOR)
Adalimumab Drug Level: 4.2 ug/mL
Anti-Adalimumab Antibody: <25 ng/mL

## 2019-06-22 ENCOUNTER — Ambulatory Visit: Payer: PPO | Attending: Internal Medicine

## 2019-06-22 ENCOUNTER — Other Ambulatory Visit: Payer: Self-pay

## 2019-06-22 DIAGNOSIS — Z23 Encounter for immunization: Secondary | ICD-10-CM

## 2019-06-22 MED ORDER — HUMIRA (2 PEN) 40 MG/0.4ML ~~LOC~~ AJKT: 40.0000 mg | AUTO-INJECTOR | SUBCUTANEOUS | 12 refills | Status: DC

## 2019-06-22 NOTE — Progress Notes (Signed)
   Covid-19 Vaccination Clinic  Name:  Kathy [name]    MRN: [id number] DOB: [birth year]  06/22/2019  Kathy [name] was observed post Covid-19 immunization for 15 minutes without incidence. She was provided with Vaccine Information Sheet and instruction to access the V-Safe system.   Kathy [name] was instructed to call 911 with any severe reactions post vaccine: Marland Kitchen Difficulty breathing  . Swelling of your face and throat  . A fast heartbeat  . A bad rash all over your body  . Dizziness and weakness    Immunizations Administered    Name Date Dose VIS Date Route   Pfizer COVID-19 Vaccine 06/22/2019  8:39 AM 0.3 mL 04/06/2019 Intramuscular   Manufacturer: [place]   Lot: [id number]   [place]: [id number]

## 2019-07-02 ENCOUNTER — Other Ambulatory Visit: Payer: Self-pay | Admitting: Family Medicine

## 2019-07-02 ENCOUNTER — Other Ambulatory Visit: Payer: Self-pay | Admitting: Internal Medicine

## 2019-07-02 DIAGNOSIS — E119 Type 2 diabetes mellitus without complications: Secondary | ICD-10-CM

## 2019-07-13 ENCOUNTER — Telehealth: Payer: Self-pay | Admitting: Internal Medicine

## 2019-07-13 ENCOUNTER — Other Ambulatory Visit: Payer: Self-pay | Admitting: Internal Medicine

## 2019-07-13 ENCOUNTER — Other Ambulatory Visit: Payer: Self-pay

## 2019-07-13 ENCOUNTER — Other Ambulatory Visit: Payer: PPO

## 2019-07-13 DIAGNOSIS — K50111 Crohn's disease of large intestine with rectal bleeding: Secondary | ICD-10-CM

## 2019-07-13 DIAGNOSIS — R197 Diarrhea, unspecified: Secondary | ICD-10-CM

## 2019-07-13 DIAGNOSIS — K50119 Crohn's disease of large intestine with unspecified complications: Secondary | ICD-10-CM

## 2019-07-13 NOTE — Telephone Encounter (Signed)
Pt states she is still having multiple diarrhea stools/day with BRB despite taking Humira every 5 days, Imuran 28m daily and lomotil QID. Reports she can go to just urinate and she has diarrhea come out. She took her last Humira injection on Monday and is due for next shot tomorrow. Please advise.

## 2019-07-13 NOTE — Telephone Encounter (Signed)
Spoke with pt and she is aware, orders in epic.

## 2019-07-13 NOTE — Telephone Encounter (Signed)
Pt reported that she is experiencing diarrhea with Crohn's flare.  Please call back to discuss.

## 2019-07-13 NOTE — Telephone Encounter (Signed)
Have her submit a gi pathogen panel and fecal calprotectin She may need low dose steroid, but want to be sure not infectious Can increase lomotil to 2 tabs QIDPRN once stool submitted while waiting on results

## 2019-07-16 ENCOUNTER — Other Ambulatory Visit: Payer: PPO

## 2019-07-16 DIAGNOSIS — K50119 Crohn's disease of large intestine with unspecified complications: Secondary | ICD-10-CM

## 2019-07-16 DIAGNOSIS — K50111 Crohn's disease of large intestine with rectal bleeding: Secondary | ICD-10-CM | POA: Diagnosis not present

## 2019-07-16 DIAGNOSIS — R197 Diarrhea, unspecified: Secondary | ICD-10-CM | POA: Diagnosis not present

## 2019-07-17 ENCOUNTER — Ambulatory Visit: Payer: PPO | Attending: Internal Medicine

## 2019-07-17 DIAGNOSIS — Z23 Encounter for immunization: Secondary | ICD-10-CM

## 2019-07-17 NOTE — Progress Notes (Signed)
   Covid-19 Vaccination Clinic  Name:  Hildagarde Holleran    MRN: [id number] DOB: October 04, 1955  07/17/2019  Ms. Smith-Milliken was observed post Covid-19 immunization for 15 minutes without incident. She was provided with Vaccine Information Sheet and instruction to access the V-Safe system.   Ms. Morua was instructed to call 911 with any severe reactions post vaccine: Marland Kitchen Difficulty breathing  . Swelling of face and throat  . A fast heartbeat  . A bad rash all over body  . Dizziness and weakness   Immunizations Administered    Name Date Dose VIS Date Route   Pfizer COVID-19 Vaccine 07/17/2019 10:00 AM 0.3 mL 04/06/2019 Intramuscular   Manufacturer: [place]   Lot: [id number]   [place]: [id number]

## 2019-07-19 ENCOUNTER — Other Ambulatory Visit: Payer: Self-pay

## 2019-07-19 DIAGNOSIS — K50119 Crohn's disease of large intestine with unspecified complications: Secondary | ICD-10-CM

## 2019-07-19 LAB — GI PROFILE, STOOL, PCR

## 2019-07-19 LAB — CALPROTECTIN, FECAL: Calprotectin, Fecal: 1298 ug/g — ABNORMAL HIGH (ref 0–120)

## 2019-07-19 MED ORDER — AZATHIOPRINE 100 MG PO TABS: 100.0000 mg | ORAL_TABLET | Freq: Every day | ORAL | 3 refills | Status: DC

## 2019-07-19 MED ORDER — BUDESONIDE 3 MG PO CPEP: 9.0000 mg | ORAL_CAPSULE | Freq: Every day | ORAL | 1 refills | Status: DC

## 2019-07-24 ENCOUNTER — Telehealth: Payer: Self-pay | Admitting: Internal Medicine

## 2019-07-24 MED ORDER — HUMIRA (2 PEN) 40 MG/0.4ML ~~LOC~~ AJKT: 40.0000 mg | AUTO-INJECTOR | SUBCUTANEOUS | 12 refills | Status: DC

## 2019-07-24 NOTE — Telephone Encounter (Signed)
Patient indicates that [employer] gave her the Humira prefilled syringes rather than the prefilled pens. She is unable to give this medication to herself in syringe form. I have advised that one of our nurses or cma's will be happy to help her with this. She will come by today for assistance.   In addition, she requests that future Humira rx's be sent to [facility] since she has had several issues with [facility] in the last few months with her Humira. Rx sent to [facility]. Patient verbalizes understanding.

## 2019-07-24 NOTE — Telephone Encounter (Signed)
Patient came by office. I administered Humira (69m/0.4 ml) 0.4 ml SQ into LLQ of abdomen. Patient tolerated well without difficulty.  Humira NDC [address]-99Exp 09/2020 Lot [id number] Patient provided medication.

## 2019-07-24 NOTE — Telephone Encounter (Signed)
Patient calling with questions about humira. I let her know [name] was not here today and one of the other nurses would return her call and she states she doesn't want to speak with a nurse who doesn't know her past. So she had asked to speak with you since you work with her as well.

## 2019-08-06 ENCOUNTER — Encounter: Payer: Self-pay | Admitting: Internal Medicine

## 2019-08-06 ENCOUNTER — Ambulatory Visit: Payer: PPO | Admitting: Internal Medicine

## 2019-08-06 VITALS — BP 140/66 | HR 76 | Temp 97.5°F | Ht 59.0 in | Wt 209.2 lb

## 2019-08-06 DIAGNOSIS — K50119 Crohn's disease of large intestine with unspecified complications: Secondary | ICD-10-CM | POA: Diagnosis not present

## 2019-08-06 DIAGNOSIS — K6289 Other specified diseases of anus and rectum: Secondary | ICD-10-CM

## 2019-08-06 DIAGNOSIS — R300 Dysuria: Secondary | ICD-10-CM

## 2019-08-06 DIAGNOSIS — K50111 Crohn's disease of large intestine with rectal bleeding: Secondary | ICD-10-CM

## 2019-08-06 MED ORDER — CLOTRIMAZOLE-BETAMETHASONE 1-0.05 % EX CREA: 1.0000 "application " | TOPICAL_CREAM | Freq: Two times a day (BID) | CUTANEOUS | 1 refills | Status: AC

## 2019-08-06 NOTE — Progress Notes (Signed)
Subjective:    Patient ID: Kathy [name], female    DOB: [birth year], 64 y.o.   MRN: [id number]  HPI Kathy [name] is a 64 year old female with a history of colonic and perianal Crohn's disease, hypertension, hyperlipidemia, diabetes and asthma who is here for follow-up.  She is here alone today and I last saw her on 06/07/2019.  At the time of her last visit she was having a flare of her Crohn's disease but she had been off of Humira and azathioprine for nearly a month.  We started therapy back with Humira every 7 days and azathioprine 50 mg a day.  Symptoms seem to not improve and she was having significant diarrhea, perianal pain, fecal leakage/smearing, rectal bleeding and abdominal discomfort.  I brought her back and she had a negative infectious stool panel but a very elevated fecal calprotectin greater than 1200.  We also found her Humira level to be below ideal at around 4.2 at trough despite every 7-day dosing.  Thus we increased Humira to every 5 days, increased azathioprine to 100 mg a day and started her on budesonide in the form of Entocort because Uceris was not covered by insurance at 9 mg a day.  She reports that she is making slow improvement.  The diarrhea was quite severe and it has gotten better.  She is having much less loose stools and the stools are starting to form.  She is also not having the fecal incontinence of loose stool.  She was worried that she had feces from her vagina but she saw Dr. Orvan Seen who did not find a vaginal fistula.  She is having pain in her superior gluteal cleft and also perianal discomfort.  She is worried about a fistula.  For a few days about a week ago it was very hard to sit and then she reports something drained pus and blood spontaneously.  This made her perianal pain improved.  Abdominal pain is improving also.  Appetite is still not great.  She has not had a negative prednisone side effects which she has had previously with prednisone to  budesonide.  No fevers or chills.  She is having some dysuria and cloudy urine.  She completed her COVID-19 vaccination   Review of Systems As per HPI, otherwise negative  Current Medications, Allergies, Past Medical History, Past Surgical History, Family History and Social History were reviewed in [employer] record.     Objective:   Physical Exam BP 140/66   Pulse 76   Temp (!) 97.5 F (36.4 C)   Ht 4' 11"  (1.499 m)   Wt 209 lb 3.2 oz (94.9 kg)   BMI 42.25 kg/m  Gen: awake, alert, NAD HE ENT: Anicteric CV: RRR Pulm: CTA b/l Abd: soft, NT/ND, +BS throughout Rectal: Thickened perianal skin which is swollen, at the anterior anal canal there is a 2 mm red sore which may be fistula which is not overly tender today and I cannot express fluid from this on exam, there is severe erosion/skin breakdown in the superior gluteal cleft which is tender to touch Ext: no c/c/e Neuro: nonfocal  Fecal calprotectin 1298 Adalimumab level on 06/07/2019 was 4.2, without antibodies    Assessment & Plan:  64 year old female with a history of colonic and perianal Crohn's disease, hypertension, hyperlipidemia, diabetes and asthma who is here for follow-up.  1.  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  Her Crohn's flared when her Humira and azathioprine medications lapsed for a period of about a month in December/January 2021.  We have increased Humira trying to reach trough levels greater than 7.5.  We have also increased azathioprine and have her on budesonide.  She is slowly improving but I would like to repeat imaging to exclude fistula but also abscess. --Continue Humira 40 mg every 5 days --Continue azathioprine 100 mg a day --Check CBC and CMP today as we have recently increased azathioprine dose, rule out leukopenia and hepatotoxicity --MRI pelvis with and without contrast,  evaluate perianal Crohn's disease rule out fistula and abscess --Continue budesonide 9 mg daily until follow-up, follow-up with me in about 6 weeks --Can use Lomotil 1 to 2 tablets 4 times daily as needed --Lotrisone cream twice daily to the superior gluteal cleft  2.  Dysuria --UA and culture  3.  GERD --history of esophagitis, continue daily PPI  30 minutes total spent today including patient facing time, coordination of care, reviewing medical history/procedures/pertinent radiology studies, and documentation of the encounter.

## 2019-08-06 NOTE — Patient Instructions (Addendum)
Your provider has requested that you go to the basement level for lab work before leaving today. Press "B" on the elevator. The lab is located at the first door on the left as you exit the elevator.  Continue Humira every 5 days.  Continue Azathioprine 100 mg daily.  Continue Entocort 9 mg daily.  Continue Lomotil.  Please follow up with Dr Hilarie Fredrickson on 09/17/19 at 3:00 pm.  We have sent the following medications to your pharmacy for you to pick up at your convenience: Lotrisone-Apply to rectum twice daily  You have been scheduled for an MRI pelvis at [facility] on 08/15/19. Your appointment time is 8:00 am. Please arrive 30 minutes prior to your appointment time for registration purposes. Please make certain not to have anything to eat or drink 4 hours prior to your test. In addition, if you have any metal in your body, have a pacemaker or defibrillator, please be sure to let your ordering physician know. This test typically takes 45 minutes to 1 hour to complete. Should you need to reschedule, please call [phone] to do so.

## 2019-08-07 ENCOUNTER — Telehealth: Payer: Self-pay | Admitting: *Deleted

## 2019-08-07 ENCOUNTER — Other Ambulatory Visit (INDEPENDENT_AMBULATORY_CARE_PROVIDER_SITE_OTHER): Payer: PPO

## 2019-08-07 DIAGNOSIS — R300 Dysuria: Secondary | ICD-10-CM | POA: Diagnosis not present

## 2019-08-07 DIAGNOSIS — K50119 Crohn's disease of large intestine with unspecified complications: Secondary | ICD-10-CM | POA: Diagnosis not present

## 2019-08-07 DIAGNOSIS — D649 Anemia, unspecified: Secondary | ICD-10-CM

## 2019-08-07 LAB — COMPREHENSIVE METABOLIC PANEL
ALT: 8 U/L (ref 0–35)
AST: 7 U/L (ref 0–37)
Albumin: 3.5 g/dL (ref 3.5–5.2)
Alkaline Phosphatase: 74 U/L (ref 39–117)
BUN: 20 mg/dL (ref 6–23)
CO2: 29 mEq/L (ref 19–32)
Calcium: 9.4 mg/dL (ref 8.4–10.5)
Chloride: 104 mEq/L (ref 96–112)
Creatinine, Ser: 1.32 mg/dL — ABNORMAL HIGH (ref 0.40–1.20)
GFR: 48.72 mL/min — ABNORMAL LOW (ref >60.00)
Glucose, Bld: 79 mg/dL (ref 70–99)
Potassium: 3.9 mEq/L (ref 3.5–5.1)
Sodium: 140 mEq/L (ref 135–145)
Total Bilirubin: 0.3 mg/dL (ref 0.2–1.2)
Total Protein: 7.9 g/dL (ref 6.0–8.3)

## 2019-08-07 LAB — CBC WITH DIFFERENTIAL/PLATELET
Basophils Absolute: 0 10*3/uL (ref 0.0–0.1)
Basophils Relative: 0.8 % (ref 0.0–3.0)
Eosinophils Absolute: 0.2 10*3/uL (ref 0.0–0.7)
Eosinophils Relative: 3.3 % (ref 0.0–5.0)
HCT: 33.3 % — ABNORMAL LOW (ref 36.0–46.0)
Hemoglobin: 10.4 g/dL — ABNORMAL LOW (ref 12.0–15.0)
Lymphocytes Relative: 36.2 % (ref 12.0–46.0)
Lymphs Abs: 2 10*3/uL (ref 0.7–4.0)
MCHC: 31.4 g/dL (ref 30.0–36.0)
MCV: 71 fl — ABNORMAL LOW (ref 78.0–100.0)
Monocytes Absolute: 0.8 10*3/uL (ref 0.1–1.0)
Monocytes Relative: 15.4 % — ABNORMAL HIGH (ref 3.0–12.0)
Neutro Abs: 2.4 10*3/uL (ref 1.4–7.7)
Neutrophils Relative %: 44.3 % (ref 43.0–77.0)
Platelets: 369 10*3/uL (ref 150.0–400.0)
RBC: 4.68 Mil/uL (ref 3.87–5.11)
RDW: 16.9 % — ABNORMAL HIGH (ref 11.5–15.5)
WBC: 5.4 10*3/uL (ref 4.0–10.5)

## 2019-08-07 LAB — URINALYSIS, ROUTINE W REFLEX MICROSCOPIC
Bilirubin Urine: NEGATIVE
Ketones, ur: NEGATIVE
Nitrite: NEGATIVE
Specific Gravity, Urine: 1.02 (ref 1.000–1.030)
Total Protein, Urine: 30 — AB
Urine Glucose: NEGATIVE
Urobilinogen, UA: 1 (ref 0.0–1.0)
pH: 6 (ref 5.0–8.0)

## 2019-08-07 LAB — FERRITIN: Ferritin: 59.5 ng/mL (ref 10.0–291.0)

## 2019-08-07 MED ORDER — CIPROFLOXACIN HCL 500 MG PO TABS: 500.0000 mg | ORAL_TABLET | Freq: Two times a day (BID) | ORAL | 0 refills | Status: AC

## 2019-08-07 NOTE — Telephone Encounter (Signed)
I have spoken to patient to advise of Dr Vena Rua interpretation and recommendations for recent lab results. Patient is advised she will need to start Cipro 500 mg twice daily x 7 days for UTI but would possibly need to change if culture shows decreased sensitivity to cipro antibiotic. Patient verbalizes understanding. In addition, I have spoken to Margarita Grizzle in our lab who indicates that they can add ferritin lab on to other labs completed today.

## 2019-08-07 NOTE — Telephone Encounter (Signed)
-----   Message from Jerene Bears, MD sent at 08/07/2019  2:14 PM EDT ----- Please let patient know that her electrolytes and liver numbers are normal.  Her kidney function is stable. She is slightly more anemic than previously but this is likely related to her recent Crohn's flare Her white count is normal. Please see if they can add on a ferritin, if not she does not have to return for 1 at this time. Urinalysis shows that she definitely has a UTI Culture is pending which may lead to change in antibiotics but for now would start Cipro 500 mg twice daily x7 days Once I see the urine culture we will ensure the bacteria causing this infection is sensitive to Cipro and if not change antibiotic

## 2019-08-09 LAB — URINE CULTURE
MICRO NUMBER:: 10357207
SPECIMEN QUALITY:: ADEQUATE

## 2019-08-15 ENCOUNTER — Other Ambulatory Visit: Payer: Self-pay

## 2019-08-15 ENCOUNTER — Ambulatory Visit (HOSPITAL_COMMUNITY)
Admission: RE | Admit: 2019-08-15 | Discharge: 2019-08-15 | Disposition: A | Discharge status: Home / Self Care | Payer: PPO | Source: Ambulatory Visit | Attending: Internal Medicine | Admitting: Internal Medicine

## 2019-08-15 DIAGNOSIS — K50119 Crohn's disease of large intestine with unspecified complications: Secondary | ICD-10-CM | POA: Insufficient documentation

## 2019-08-15 DIAGNOSIS — D259 Leiomyoma of uterus, unspecified: Secondary | ICD-10-CM | POA: Diagnosis not present

## 2019-08-15 DIAGNOSIS — K5090 Crohn's disease, unspecified, without complications: Secondary | ICD-10-CM | POA: Diagnosis not present

## 2019-08-15 MED ORDER — GADOBUTROL 1 MMOL/ML IV SOLN
10.0000 mL | Freq: Once | INTRAVENOUS | Status: AC | PRN
  Administered 2019-08-15: 10 mL via INTRAVENOUS

## 2019-08-17 ENCOUNTER — Telehealth: Payer: Self-pay | Admitting: *Deleted

## 2019-08-17 MED ORDER — HYDROCORTISONE ACETATE 25 MG RE SUPP: 25.0000 mg | Freq: Every evening | RECTAL | 0 refills | Status: AC

## 2019-08-17 NOTE — Telephone Encounter (Signed)
-----   Message from Jerene Bears, MD sent at 08/17/2019  8:15 AM EDT ----- Would hydrocort supp 25 mg qHS be affordable to use for 2 weeks?Either with insurance or goodrx?

## 2019-08-20 ENCOUNTER — Other Ambulatory Visit: Payer: Self-pay | Admitting: Internal Medicine

## 2019-09-04 DIAGNOSIS — N950 Postmenopausal bleeding: Secondary | ICD-10-CM | POA: Diagnosis not present

## 2019-09-07 ENCOUNTER — Telehealth: Payer: Self-pay | Admitting: Family Medicine

## 2019-09-07 NOTE — Telephone Encounter (Signed)
Patient is requesting to re-established care with provider.   Please Advise

## 2019-09-07 NOTE — Telephone Encounter (Signed)
Patient was a patient of [facility] and would like to re-establish care with provider.  Please Advise

## 2019-09-07 NOTE — Telephone Encounter (Signed)
Unless patient is a family member of another [facility] patient. [facility] is not taking new patients

## 2019-09-10 NOTE — Telephone Encounter (Signed)
Pt has no visits in Epic with [facility] in the last 10 years. Per [facility] no new patients unless they are a family member of a current patient or referred by a doctor.

## 2019-09-17 ENCOUNTER — Encounter: Payer: Self-pay | Admitting: Internal Medicine

## 2019-09-17 ENCOUNTER — Ambulatory Visit: Payer: PPO | Admitting: Internal Medicine

## 2019-09-17 VITALS — BP 128/70 | HR 63 | Ht 59.0 in | Wt 220.6 lb

## 2019-09-17 DIAGNOSIS — K50111 Crohn's disease of large intestine with rectal bleeding: Secondary | ICD-10-CM

## 2019-09-17 DIAGNOSIS — K50119 Crohn's disease of large intestine with unspecified complications: Secondary | ICD-10-CM

## 2019-09-17 NOTE — Patient Instructions (Addendum)
Finish your course of budesonide.  Continue with current dose of Humira and azathioprine.  Please follow up with Dr Hilarie Fredrickson on  12/24/19 at 2:10 pm.  If you are age 66 or older, your body mass index should be between 23-30. Your Body mass index is 44.56 kg/m. If this is out of the aforementioned range listed, please consider follow up with your Primary Care Provider.  If you are age 65 or younger, your body mass index should be between 19-25. Your Body mass index is 44.56 kg/m. If this is out of the aformentioned range listed, please consider follow up with your Primary Care Provider.   Due to recent changes in healthcare laws, you may see the results of your imaging and laboratory studies on MyChart before your provider has had a chance to review them.  We understand that in some cases there may be results that are confusing or concerning to you. Not all laboratory results come back in the same time frame and the provider may be waiting for multiple results in order to interpret others.  Please give [place] 48 hours in order for your provider to thoroughly review all the results before contacting the office for clarification of your results.

## 2019-09-17 NOTE — Progress Notes (Signed)
Subjective:    Patient ID: Kathy [name], female    DOB: [birth year], 64 y.o.   MRN: [id number]  HPI Kathy [name] is a 64 year old female with a history of colonic and perianal Crohn's disease with perianal fistula, hypertension, hyperlipidemia, diabetes and asthma who is here for follow-up.  She is here alone today and I last saw her on 08/06/2019.  She had an MRI of her pelvis performed since her last visit with me.  This showed no well-defined perianal fistula or abscess.  There was rectal mucosal enhancement reflecting low-grade inflammation.  Her uterine fibroids were again seen.    She has continued therapy with Humira every 5 days, azathioprine, she has remained on budesonide 9 mg a day.  She reports that she has had further improvement since her last visit here.  Her bowel movements are much less painful and she is having more formed to her stool.  She is having 2-4 bowel movements daily but the dominant bowel movement is in the morning.  There is again much less knifelike pain with passing stool and very rare scant rectal bleeding.  She is used MiraLAX a couple of times for feeling constipated but the diarrhea has resolved.  She is not using MiraLAX daily.  She did not get the hydrocortisone suppositories recommended after MRI showed rectal inflammation because they were cost prohibitive.  Energy levels have improved.  Good appetite.  She is becoming more active.   Review of Systems As per HPI, otherwise negative  Current Medications, Allergies, Past Medical History, Past Surgical History, Family History and Social History were reviewed in [employer] record.     Objective:   Physical Exam BP 128/70   Pulse 63   Ht 4' 11"  (1.499 m)   Wt 220 lb 9.6 oz (100.1 kg)   SpO2 99%   BMI 44.56 kg/m  Gen: awake, alert, NAD HEENT: anicteric Neuro: nonfocal  CMP     Component Value Date/Time   NA 140 08/07/2019 1110   NA 139 10/11/2018 1009   K 3.9  08/07/2019 1110   CL 104 08/07/2019 1110   CO2 29 08/07/2019 1110   GLUCOSE 79 08/07/2019 1110   BUN 20 08/07/2019 1110   BUN 14 10/11/2018 1009   CREATININE 1.32 (H) 08/07/2019 1110   CALCIUM 9.4 08/07/2019 1110   PROT 7.9 08/07/2019 1110   PROT 7.4 10/11/2018 1009   ALBUMIN 3.5 08/07/2019 1110   ALBUMIN 3.5 (L) 10/11/2018 1009   AST 7 08/07/2019 1110   ALT 8 08/07/2019 1110   ALKPHOS 74 08/07/2019 1110   BILITOT 0.3 08/07/2019 1110   BILITOT 0.3 10/11/2018 1009   GFRNONAA 42 (L) 10/11/2018 1009   GFRAA 48 (L) 10/11/2018 1009     MRI PELVIS WITHOUT AND WITH CONTRAST   TECHNIQUE: Multiplanar multisequence MR imaging of the pelvis was performed both before and after administration of intravenous contrast.   CONTRAST:  25m GADAVIST GADOBUTROL 1 MMOL/ML IV SOLN   COMPARISON:  09/13/2018   FINDINGS: Urinary Tract: No gas in the urinary bladder. No distal hydroureter. T2 hyperintense lesions in the right mid kidney are nonspecific, images 8-14 of series 2, but probably cysts based on prior MRI abdomen.   Bowel: Mucosal enhancement in the rectum is nonspecific given the degree of nondistention but could conceivably reflect some low-grade inflammation. I do not observe a well-defined para anal fistula nor is there a perirectal or perianal abscess identified.   No dilated bowel.  Vascular/Lymphatic: Unremarkable. No pathologic adenopathy in the pelvis.   Reproductive: Posterior uterine body fibroid 2.0 cm in diameter on image 9/4. Right anterior uterine fibroid 0.9 cm in diameter, image 11/3. Ovaries unremarkable.   Other:  No supplemental non-categorized findings.   Musculoskeletal: Unremarkable   IMPRESSION: 1. No well-defined para anal fistula or abscess is identified. 2. Mucosal enhancement in the rectum is nonspecific but could reflect low-grade inflammation. 3. Uterine fibroids. 4. Small T2 hyperintense lesions in the right mid kidney are probably cysts  based on prior MRI.     Electronically Signed   By: Van Clines M.D.   On: 08/16/2019 13:48        Assessment & Plan:  64 year old female with a history of colonic and perianal Crohn's disease with perianal fistula, hypertension, hyperlipidemia, diabetes and asthma who is here for follow-up.   1.  Colonic and perianal Crohn's disease with history of fistula --she is improving after having a severe flare due to lapse in her medical therapy.  We have determined that on Humira every 7 days her trough level is subtherapeutic but every 5-day dosing along with azathioprine therapy she is significantly improved.  She is also been on budesonide 9 mg daily.  I think it is reasonable to remove budesonide after the planned 8-week course, she has about 10 days left.  We will proceed as follows: --Continue Humira 40 mg every 5 days --Continue azathioprine 100 mg a day; white count and liver enzymes normal when recently checked --MRI pelvis reassuring --Discontinue budesonide when course completes in about 10 days; she is reminded to notify me if she feels recurrent Crohn's symptoms as budesonide is discontinued --She is not needing Lomotil but can use this as needed along with dicyclomine as needed and MiraLAX as needed  2.  GERD with history of esophagitis --- continue daily PPI  I would like to see her in 3 months, sooner if needed  20 minutes total spent today including patient facing time, coordination of care, reviewing medical history/procedures/pertinent radiology studies, and documentation of the encounter.

## 2019-09-26 ENCOUNTER — Other Ambulatory Visit: Payer: Self-pay

## 2019-09-26 DIAGNOSIS — Z8679 Personal history of other diseases of the circulatory system: Secondary | ICD-10-CM

## 2019-09-26 MED ORDER — HYDROCHLOROTHIAZIDE 12.5 MG PO CAPS: ORAL_CAPSULE | ORAL | 1 refills | Status: DC

## 2019-09-26 NOTE — Telephone Encounter (Signed)
Fax refill request for hctz 12.5 mg , sent to pharmacy for coverage until sees new pcp.

## 2019-10-16 DIAGNOSIS — N950 Postmenopausal bleeding: Secondary | ICD-10-CM | POA: Diagnosis not present

## 2019-10-24 ENCOUNTER — Ambulatory Visit (INDEPENDENT_AMBULATORY_CARE_PROVIDER_SITE_OTHER): Payer: PPO | Admitting: Family

## 2019-10-24 ENCOUNTER — Other Ambulatory Visit: Payer: Self-pay

## 2019-10-24 ENCOUNTER — Encounter: Payer: Self-pay | Admitting: Family

## 2019-10-24 VITALS — BP 117/47 | HR 88 | Temp 98.4°F | Resp 16 | Ht 58.7 in | Wt 220.0 lb

## 2019-10-24 DIAGNOSIS — K219 Gastro-esophageal reflux disease without esophagitis: Secondary | ICD-10-CM

## 2019-10-24 DIAGNOSIS — E119 Type 2 diabetes mellitus without complications: Secondary | ICD-10-CM

## 2019-10-24 DIAGNOSIS — I5189 Other ill-defined heart diseases: Secondary | ICD-10-CM | POA: Diagnosis not present

## 2019-10-24 DIAGNOSIS — R5383 Other fatigue: Secondary | ICD-10-CM | POA: Diagnosis not present

## 2019-10-24 DIAGNOSIS — E785 Hyperlipidemia, unspecified: Secondary | ICD-10-CM | POA: Diagnosis not present

## 2019-10-24 DIAGNOSIS — E1165 Type 2 diabetes mellitus with hyperglycemia: Secondary | ICD-10-CM

## 2019-10-24 DIAGNOSIS — Z8679 Personal history of other diseases of the circulatory system: Secondary | ICD-10-CM | POA: Diagnosis not present

## 2019-10-24 DIAGNOSIS — I119 Hypertensive heart disease without heart failure: Secondary | ICD-10-CM | POA: Diagnosis not present

## 2019-10-24 DIAGNOSIS — K50119 Crohn's disease of large intestine with unspecified complications: Secondary | ICD-10-CM | POA: Diagnosis not present

## 2019-10-24 DIAGNOSIS — J45909 Unspecified asthma, uncomplicated: Secondary | ICD-10-CM

## 2019-10-24 MED ORDER — GLIPIZIDE ER 10 MG PO TB24: ORAL_TABLET | ORAL | 1 refills | Status: DC

## 2019-10-24 MED ORDER — AMLODIPINE BESYLATE 10 MG PO TABS: 10.0000 mg | ORAL_TABLET | Freq: Every day | ORAL | 1 refills | Status: DC

## 2019-10-24 MED ORDER — HYDROCHLOROTHIAZIDE 12.5 MG PO CAPS: ORAL_CAPSULE | ORAL | 1 refills | Status: DC

## 2019-10-24 NOTE — Progress Notes (Signed)
Subjective:    Patient ID: Kathy [name], female    DOB: [birth year], 64 y.o.   MRN: [id number]  HPI  Patient is a 64 yr old female who presents today to establish care.  Pmhx is significant for the following.  Her previous MD left and she ran out of glipizide for 2.5 weeks.    DM2- she was diagnosed >20 yrs ago. Reports that her food choices are good "when I do eat." She eats about 2 meals a day. Sometimes less due to anorexia that she attributes to her crohn's medication.  Wt Readings from Last 3 Encounters:  10/24/19 220 lb (99.8 kg)  09/17/19 220 lb 9.6 oz (100.1 kg)  08/06/19 209 lb 3.2 oz (94.9 kg)   Lab Results  Component Value Date   HGBA1C 9.2 (A) 03/02/2019   HGBA1C 9.5 (A) 01/16/2019   HGBA1C 8.4 (H) 10/11/2018   Lab Results  Component Value Date   MICROALBUR 6.9 (H) 04/28/2010   LDLCALC 56 10/11/2018   CREATININE 1.32 (H) 08/07/2019   Crohn's Colitis-  She is following with Dr. Hilarie Fredrickson and she is treated with humira which helps her symptoms significantly.  HTN- maintained on metoprolol 37m xl.  BP Readings from Last 3 Encounters:  10/24/19 (!) 117/47  09/17/19 128/70  08/06/19 140/66   Hyperlipidemia- maintained on simvastatin.   Lab Results  Component Value Date   CHOL 107 10/11/2018   HDL 35 (L) 10/11/2018   LDLCALC 56 10/11/2018   TRIG 80 10/11/2018   CHOLHDL 3.1 10/11/2018   GERD/esophagitis- maintained on omeprazole. Reports symptoms stable.  Diastolic dysfunction- denies DOE.  Reports low energy level. Reports cold intolerance and fatigue. Reports chronic aching in her joints, chills.   Asthma- uses albuterol prn.   Review of Systems See HPI  Past Medical History:  Diagnosis Date   Asthma    09-23-2017  per pt has not done inhaler since May 2018, no insurance (QVAR bid and Ventolin prn)   Diastolic dysfunction    per echo [id number] grade 2 diastolic dysfunction , ef 60-65%   Esophagitis    GERD (gastroesophageal reflux  disease)    Hemorrhoids    Hiatal hernia    Hyperlipidemia    stopped meds due to side effects   Hypertension    09-23-2017  per pt has takes bp meds since May 2018 no insurance (losartan 1036mqd, HCTZ 12.7m27md, and toprol 50 qd)   [place]  09-23-2017 per pt has not taken diabetic meds since May 2018, no insurance (kombiglyze 5-1000m57m)     Social History   Socioeconomic History   Marital status: Married    Spouse name: Not on file   Number of children: 2   Years of education: Not on file   Highest education level: Not on file  Occupational History   Occupation: unemployed  Tobacco Use   Smoking status: Never Smoker   Smokeless tobacco: Never Used  VapiScientific laboratory technician: Never used  Substance and Sexual Activity   Alcohol use: No   Drug use: No   Sexual activity: Not Currently  Other Topics Concern   Not on file  Social History Narrative   Not working currently. Retired in 2019. Previously worked in a [employer]   Married    2 children (son and daughter). They are both local   She has 1 grand-daughter and 1 grand-son   No pets (allergic)  Completed 4 years of college.   Social Determinants of Health   Financial Resource Strain:    Difficulty of Paying Living Expenses:   Food Insecurity:    Worried About Charity fundraiser in the Last Year:    Arboriculturist in the Last Year:   Transportation Needs:    Film/video editor (Medical):    Lack of Transportation (Non-Medical):   Physical Activity:    Days of Exercise per Week:    Minutes of Exercise per Session:   Stress:    Feeling of Stress :   Social Connections:    Frequency of Communication with Friends and Family:    Frequency of Social Gatherings with Friends and Family:    Attends Religious Services:    Active Member of Clubs or Organizations:    Attends Music therapist:    Marital Status:   Intimate Partner Violence:     Fear of Current or Ex-Partner:    Emotionally Abused:    Physically Abused:    Sexually Abused:     Past Surgical History:  Procedure Laterality Date   CARDIOVASCULAR STRESS TEST  07/29/2009   normal nuclear study w/ no ischemia/  normal LV function and wall motion,  ef 70%   CESAREAN SECTION  1980s   RECTAL BIOPSY N/A 10/03/2017   Procedure: POSSIBLE BIOPSY RECTAL;  Surgeon: Ileana Roup, MD;  Location: [facility];  Service: General;  Laterality: N/A;    Family History  Problem Relation Age of Onset   Hypertension Mother 6   Stroke Mother 5   Diabetes Maternal Aunt        s/p bilater amputation due to DM   Asthma Father    Colon cancer Neg Hx    Esophageal cancer Neg Hx    Stomach cancer Neg Hx    Rectal cancer Neg Hx     Allergies  Allergen Reactions   Lipitor [Atorvastatin Calcium]     Leg cramps    Lisinopril Itching   Pravachol [Pravastatin Sodium]    Pravastatin Sodium Other (See Comments)    leg cramps    Current Outpatient Medications on File Prior to Visit  Medication Sig Dispense Refill   Adalimumab (HUMIRA PEN) 40 MG/0.4ML PNKT Inject 40 mg into the skin as directed. Inject 60m into skin every 5 days 6 each 12   albuterol (VENTOLIN HFA) 108 (90 Base) MCG/ACT inhaler Inhale 1 puff into the lungs every 6 (six) hours as needed. 18 g 3   azathioprine (IMURAN) 100 MG tablet Take 1 tablet (100 mg total) by mouth daily. 30 tablet 3   azaTHIOprine (IMURAN) 50 MG tablet Take 1 tablet by mouth once daily 30 tablet 2   budesonide (ENTOCORT EC) 3 MG 24 hr capsule Take 3 capsules (9 mg total) by mouth daily. 90 capsule 1   clotrimazole-betamethasone (LOTRISONE) cream Apply 1 application topically 2 (two) times daily. 30 g 1   dicyclomine (BENTYL) 10 MG capsule TAKE 1 CAPSULE BY MOUTH THREE TIMES DAILY AS NEEDED FOR  CRAMPING 90 capsule 2   diphenoxylate-atropine (LOMOTIL) 2.5-0.025 MG tablet TAKE 1 TABLET BY MOUTH 4 TIMES DAILY AS  NEEDED FOR DIARRHEA OR LOOSE STOOLS 120 tablet 1   hydrocortisone (ANUSOL-HC) 25 MG suppository Place 1 suppository (25 mg total) rectally at bedtime. 14 suppository 0   metoprolol succinate (TOPROL-XL) 50 MG 24 hr tablet Take 1 tablet (50 mg total) by mouth daily. 90 tablet 3   omeprazole (PRILOSEC)  40 MG capsule Take 1 capsule by mouth once daily 90 capsule 0   polyethylene glycol (MIRALAX / GLYCOLAX) 17 g packet Take 17 g by mouth as needed. 1 capful prn 30 each 11   simvastatin (ZOCOR) 10 MG tablet Take 1 tablet (10 mg total) by mouth daily. 90 tablet 3   No current facility-administered medications on file prior to visit.    BP (!) 117/47 (BP Location: Right Arm, Patient Position: Sitting, Cuff Size: Large)    Pulse 88    Temp 98.4 F (36.9 C) (Temporal)    Resp 16    Ht 4' 10.7" (1.491 m)    Wt 220 lb (99.8 kg)    SpO2 100%    BMI 44.89 kg/m       Objective:   Physical Exam Constitutional:      Appearance: She is well-developed.  Cardiovascular:     Rate and Rhythm: Normal rate and regular rhythm.     Heart sounds: Normal heart sounds. No murmur heard.   Pulmonary:     Effort: Pulmonary effort is normal. No respiratory distress.     Breath sounds: Normal breath sounds. No wheezing.  Psychiatric:        Behavior: Behavior normal.        Thought Content: Thought content normal.        Judgment: Judgment normal.           Assessment & Plan:  DM2- a1c above goal, add [place].  Crohn's colitis- stable, management per GI.  HTN- bp stable, continue metoprolol.  Asthma- stable with prn use of albuterol.    GERD- stable on PPI, continue same.  Diastolic dysfunction- clinically euvolemic. Monitor.  30 minutes spent on today's visit.  Time was spent interviewing pt, reviewing chart and counseling patient.   This visit occurred during the SARS-CoV-2 public health emergency.  Safety protocols were in place, including screening questions prior to the visit,  additional usage of staff PPE, and extensive cleaning of exam room while observing appropriate contact time as indicated for disinfecting solutions.

## 2019-10-25 ENCOUNTER — Telehealth: Payer: Self-pay | Admitting: Family

## 2019-10-25 LAB — LIPID PANEL
Cholesterol: 130 mg/dL (ref 0–200)
HDL: 38.8 mg/dL — ABNORMAL LOW (ref >39.00)
LDL Cholesterol: 52 mg/dL (ref 0–99)
NonHDL: 90.71
Total CHOL/HDL Ratio: 3
Triglycerides: 192 mg/dL — ABNORMAL HIGH (ref 0.0–149.0)
VLDL: 38.4 mg/dL (ref 0.0–40.0)

## 2019-10-25 LAB — COMPREHENSIVE METABOLIC PANEL
ALT: 7 U/L (ref 0–35)
AST: 10 U/L (ref 0–37)
Albumin: 3.9 g/dL (ref 3.5–5.2)
Alkaline Phosphatase: 98 U/L (ref 39–117)
BUN: 14 mg/dL (ref 6–23)
CO2: 31 mEq/L (ref 19–32)
Calcium: 10 mg/dL (ref 8.4–10.5)
Chloride: 96 mEq/L (ref 96–112)
Creatinine, Ser: 1.4 mg/dL — ABNORMAL HIGH (ref 0.40–1.20)
GFR: 45.49 mL/min — ABNORMAL LOW (ref >60.00)
Glucose, Bld: 254 mg/dL — ABNORMAL HIGH (ref 70–99)
Potassium: 4.5 mEq/L (ref 3.5–5.1)
Sodium: 136 mEq/L (ref 135–145)
Total Bilirubin: 0.3 mg/dL (ref 0.2–1.2)
Total Protein: 8.3 g/dL (ref 6.0–8.3)

## 2019-10-25 LAB — HEMOGLOBIN A1C: Hgb A1c MFr Bld: 8.3 % — ABNORMAL HIGH (ref 4.6–6.5)

## 2019-10-25 LAB — TSH: TSH: 2.24 u[IU]/mL (ref 0.35–4.50)

## 2019-10-25 MED ORDER — SITAGLIPTIN PHOSPHATE 50 MG PO TABS: 50.0000 mg | ORAL_TABLET | Freq: Every day | ORAL | 5 refills | Status: DC

## 2019-10-25 NOTE — Telephone Encounter (Signed)
Sugar is above goal but looks better than last check. I would like her to continue to work on diet and add [place] once daily.   Other lab work is stable.

## 2019-10-26 NOTE — Telephone Encounter (Signed)
Patient advised of results and new medication. She will also continue to work on her diet

## 2019-10-30 ENCOUNTER — Ambulatory Visit: Payer: PPO | Admitting: Family

## 2019-11-16 ENCOUNTER — Other Ambulatory Visit: Payer: Self-pay | Admitting: Internal Medicine

## 2019-11-26 ENCOUNTER — Other Ambulatory Visit: Payer: Self-pay

## 2019-11-26 ENCOUNTER — Other Ambulatory Visit: Payer: Self-pay | Admitting: Internal Medicine

## 2019-12-24 ENCOUNTER — Other Ambulatory Visit (INDEPENDENT_AMBULATORY_CARE_PROVIDER_SITE_OTHER): Payer: PPO

## 2019-12-24 ENCOUNTER — Ambulatory Visit: Payer: PPO | Admitting: Internal Medicine

## 2019-12-24 ENCOUNTER — Encounter: Payer: Self-pay | Admitting: Internal Medicine

## 2019-12-24 VITALS — BP 126/80 | HR 80 | Ht 58.75 in | Wt 212.4 lb

## 2019-12-24 DIAGNOSIS — K50111 Crohn's disease of large intestine with rectal bleeding: Secondary | ICD-10-CM

## 2019-12-24 DIAGNOSIS — K219 Gastro-esophageal reflux disease without esophagitis: Secondary | ICD-10-CM | POA: Diagnosis not present

## 2019-12-24 DIAGNOSIS — K50119 Crohn's disease of large intestine with unspecified complications: Secondary | ICD-10-CM | POA: Diagnosis not present

## 2019-12-24 DIAGNOSIS — Z79899 Other long term (current) drug therapy: Secondary | ICD-10-CM | POA: Diagnosis not present

## 2019-12-24 LAB — COMPREHENSIVE METABOLIC PANEL
ALT: 7 U/L (ref 0–35)
AST: 10 U/L (ref 0–37)
Albumin: 3.9 g/dL (ref 3.5–5.2)
Alkaline Phosphatase: 83 U/L (ref 39–117)
BUN: 17 mg/dL (ref 6–23)
CO2: 28 mEq/L (ref 19–32)
Calcium: 10 mg/dL (ref 8.4–10.5)
Chloride: 100 mEq/L (ref 96–112)
Creatinine, Ser: 1.45 mg/dL — ABNORMAL HIGH (ref 0.40–1.20)
GFR: 43.66 mL/min — ABNORMAL LOW (ref >60.00)
Glucose, Bld: 154 mg/dL — ABNORMAL HIGH (ref 70–99)
Potassium: 3.8 mEq/L (ref 3.5–5.1)
Sodium: 138 mEq/L (ref 135–145)
Total Bilirubin: 0.4 mg/dL (ref 0.2–1.2)
Total Protein: 9.1 g/dL — ABNORMAL HIGH (ref 6.0–8.3)

## 2019-12-24 LAB — CBC WITH DIFFERENTIAL/PLATELET
Basophils Absolute: 0.1 10*3/uL (ref 0.0–0.1)
Basophils Relative: 0.7 % (ref 0.0–3.0)
Eosinophils Absolute: 0.2 10*3/uL (ref 0.0–0.7)
Eosinophils Relative: 3 % (ref 0.0–5.0)
HCT: 36.7 % (ref 36.0–46.0)
Hemoglobin: 11.5 g/dL — ABNORMAL LOW (ref 12.0–15.0)
Lymphocytes Relative: 13.4 % (ref 12.0–46.0)
Lymphs Abs: 0.9 10*3/uL (ref 0.7–4.0)
MCHC: 31.3 g/dL (ref 30.0–36.0)
MCV: 69.2 fl — ABNORMAL LOW (ref 78.0–100.0)
Monocytes Absolute: 0.6 10*3/uL (ref 0.1–1.0)
Monocytes Relative: 9.1 % (ref 3.0–12.0)
Neutro Abs: 5.2 10*3/uL (ref 1.4–7.7)
Neutrophils Relative %: 73.8 % (ref 43.0–77.0)
Platelets: 386 10*3/uL (ref 150.0–400.0)
RBC: 5.3 Mil/uL — ABNORMAL HIGH (ref 3.87–5.11)
RDW: 18 % — ABNORMAL HIGH (ref 11.5–15.5)
WBC: 7.1 10*3/uL (ref 4.0–10.5)

## 2019-12-24 LAB — VITAMIN B12: Vitamin B-12: 290 pg/mL (ref 211–911)

## 2019-12-24 LAB — IBC + FERRITIN
Ferritin: 33.2 ng/mL (ref 10.0–291.0)
Iron: 56 ug/dL (ref 42–145)
Saturation Ratios: 15.6 % — ABNORMAL LOW (ref 20.0–50.0)
Transferrin: 256 mg/dL (ref 212.0–360.0)

## 2019-12-24 MED ORDER — SUTAB 1479-225-188 MG PO TABS: 1.0000 | ORAL_TABLET | ORAL | 0 refills | Status: DC

## 2019-12-24 NOTE — Progress Notes (Signed)
Subjective:    Patient ID: Kathy [name], female    DOB: [birth year], 64 y.o.   MRN: [id number]  HPI Kathy [name] is a 63 year old female with a history of colonic and perianal Crohn's disease with history of perianal fistula, hypertension, hyperlipidemia, diabetes and asthma who is here for follow-up.  She is here alone today and was last seen on 09/17/2019.  She has continued her Humira 40 mg every 5 days, azathioprine 100 mg daily and has remained off of budesonide since about 10 days after her last visit.  She reports that on the whole she is better but she still having some symptoms and her main question today is "what is remission feel like".  She has no longer had diarrhea.  She is not having visible blood in stool.  She is having some tenesmus type symptoms and passing mucus and "tissue like" material at times with bowel movement.  At times it feels like it is difficult to pass stool from her anus.  She will feel the need to have a bowel movement and straining but have little output at times.  Raw vegetables and salads increase her abdominal discomfort as does spicy foods.  Bentyl will help when needed with this symptom.  There was a time in the last couple of months where during bowel movement she felt a "pop" near the anal canal and it felt like it was then easier to pass her stool.  She has not had drainage from the perianal skin.  Energy levels are still not great at times.  Heartburn is controlled.  No nausea or vomiting.  No rashes.  Last Humira was 12/21/2019 Next dose is 12/26/2019.   Review of Systems As per HPI, otherwise negative  Current Medications, Allergies, Past Medical History, Past Surgical History, Family History and Social History were reviewed in [employer] record.     Objective:   Physical Exam BP 126/80 (BP Location: Left Wrist, Patient Position: Sitting, Cuff Size: Normal)   Pulse 80   Ht 4' 10.75" (1.492 m)   Wt 212 lb 6 oz  (96.3 kg)   BMI 43.26 kg/m  Gen: awake, alert, NAD HEENT: anicteric Neuro: nonfocal      Assessment & Plan:   64 year old female with a history of colonic and perianal Crohn's disease with history of perianal fistula, hypertension, hyperlipidemia, diabetes and asthma who is here for follow-up.  1.  Colonic and perianal Crohn's disease with history of fistula --clinically she is certainly improved than 5 or 6 months ago.  She had a flare earlier this year due to lapse in her medical therapy over an insurance/pharmacy issue.  Humira level is subtherapeutic without antibodies on every 7-day dosing but adequate at every 5-day dosing.  Azathioprine was added earlier this year.  I am still suspicious that she has some distal colonic and rectal Crohn's activity causing some of the symptoms that she is describing.  However, she is certainly better than she was some months ago and is now off of steroids.  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  Will likely dilate anal canal by digital rectal exam once she is sedated for colonoscopy.  We discussed this today including the risk, benefits and alternatives for colonoscopy and she is agreeable and wishes to proceed  2.  GERD with history of esophagitis --continue daily PPI  30 minutes total spent today including patient facing time, coordination of care, reviewing medical history/procedures/pertinent radiology studies, and documentation of the encounter.

## 2019-12-24 NOTE — Patient Instructions (Signed)
Your provider has requested that you go to the basement level for lab work before leaving today. Press "B" on the elevator. The lab is located at the first door on the left as you exit the elevator.  You have been scheduled for a colonoscopy. Please follow written instructions given to you at your visit today.  Please pick up your prep supplies at the pharmacy within the next 1-3 days. If you use inhalers (even only as needed), please bring them with you on the day of your procedure.   Continue current medications including Azathioprine and Humira.  If you are age 64 or older, your body mass index should be between 23-30. Your Body mass index is 43.26 kg/m. If this is out of the aforementioned range listed, please consider follow up with your Primary Care Provider.  If you are age 54 or younger, your body mass index should be between 19-25. Your Body mass index is 43.26 kg/m. If this is out of the aformentioned range listed, please consider follow up with your Primary Care Provider.   Due to recent changes in healthcare laws, you may see the results of your imaging and laboratory studies on MyChart before your provider has had a chance to review them.  We understand that in some cases there may be results that are confusing or concerning to you. Not all laboratory results come back in the same time frame and the provider may be waiting for multiple results in order to interpret others.  Please give [place] 48 hours in order for your provider to thoroughly review all the results before contacting the office for clarification of your results.

## 2019-12-29 LAB — THIOPURINE METABOLITES
6 MMP(6-Methylmercaptopurine): 1609 pmol/8x10(8)RBC (ref <5700)
6 TG(6-Thioguanine): 234 pmol/8x10(8)RBC — ABNORMAL LOW (ref 235–400)

## 2019-12-30 LAB — SERIAL MONITORING

## 2019-12-30 LAB — ADALIMUMAB+AB (SERIAL MONITOR)
Adalimumab Drug Level: 6.6 ug/mL
Anti-Adalimumab Antibody: <25 ng/mL

## 2020-01-28 ENCOUNTER — Other Ambulatory Visit: Payer: Self-pay | Admitting: Internal Medicine

## 2020-01-28 ENCOUNTER — Telehealth: Payer: Self-pay | Admitting: Internal Medicine

## 2020-01-28 NOTE — Telephone Encounter (Signed)
I have spoken to patient who indicates that [facility] sent a claim to her insurance company for [employer] indicates that they paid for script. HOWEVER, patient never went to pick up prep rx because she was given a sample here at the office instead. Patient is worried that her insurance is being charged for something she did not get. I have contacted "Boly" at [facility] who indicates that when a script is ran, it is noted by insurance but when script is placed back on shelf, that same "paid claim" is reversed. I have advised patient of this information and she verbalized understanding.

## 2020-01-28 NOTE — Telephone Encounter (Signed)
Pt is requesting a call back from a nruse to discuss her Sutab.

## 2020-02-01 ENCOUNTER — Encounter: Payer: Self-pay | Admitting: Internal Medicine

## 2020-02-04 ENCOUNTER — Ambulatory Visit (INDEPENDENT_AMBULATORY_CARE_PROVIDER_SITE_OTHER): Payer: PPO | Admitting: Family

## 2020-02-04 ENCOUNTER — Encounter: Payer: Self-pay | Admitting: Family

## 2020-02-04 ENCOUNTER — Other Ambulatory Visit: Payer: Self-pay

## 2020-02-04 VITALS — BP 129/49 | HR 81 | Temp 98.8°F | Resp 16 | Ht 59.0 in | Wt 210.0 lb

## 2020-02-04 DIAGNOSIS — M79672 Pain in left foot: Secondary | ICD-10-CM

## 2020-02-04 DIAGNOSIS — Z0000 Encounter for general adult medical examination without abnormal findings: Secondary | ICD-10-CM

## 2020-02-04 DIAGNOSIS — E119 Type 2 diabetes mellitus without complications: Secondary | ICD-10-CM

## 2020-02-04 DIAGNOSIS — Z23 Encounter for immunization: Secondary | ICD-10-CM | POA: Diagnosis not present

## 2020-02-04 DIAGNOSIS — R5383 Other fatigue: Secondary | ICD-10-CM | POA: Diagnosis not present

## 2020-02-04 DIAGNOSIS — M109 Gout, unspecified: Secondary | ICD-10-CM

## 2020-02-04 LAB — URIC ACID: Uric Acid, Serum: 9.3 mg/dL — ABNORMAL HIGH (ref 2.5–7.0)

## 2020-02-04 MED ORDER — COLCHICINE 0.6 MG PO TABS: ORAL_TABLET | ORAL | 0 refills | Status: DC

## 2020-02-04 NOTE — Progress Notes (Signed)
Acute Office Visit  Subjective:    Patient ID: Kathy [name], female    DOB: 1955-10-17, 64 y.o.   MRN: [id number]  Chief Complaint  Patient presents with  . Annual Exam    HPI Patient is in today for cpx and follow up.  Patient presents today for complete physical.  Immunizations: recommended pfizer booster, flu shot today.  Tetanus up to date.  She declines Shingrix. Declines pneumovax Diet: reports poor appetite- she is requesting a referral to a dietician Wt Readings from Last 3 Encounters:  02/04/20 210 lb (95.3 kg)  12/24/19 212 lb 6 oz (96.3 kg)  10/24/19 220 lb (99.8 kg)  Exercise: not exercising, feels like her stamina is low Colonoscopy: 2019- scheduled for colo next week Mammogram: 2/19 Ophthalmology- scheduled for next week.   Left foot/ankle pain- started a few days ago. No injury. Very painful. Having trouble walking.   DM2- last visit we added januvia. She reports one day when she had a sugar >400.  She is continued on glipizide and Januvia. She reports  Lab Results  Component Value Date   HGBA1C 8.3 (H) 10/24/2019   HGBA1C 9.2 (A) 03/02/2019   HGBA1C 9.5 (A) 01/16/2019   Lab Results  Component Value Date   MICROALBUR 6.9 (H) 04/28/2010   LDLCALC 52 10/24/2019   CREATININE 1.45 (H) 12/24/2019     Past Medical History:  Diagnosis Date  . Asthma    09-23-2017  per pt has not done inhaler since May 2018, no insurance (QVAR bid and Ventolin prn)  . Diastolic dysfunction    per echo [id number]  grade 2 diastolic dysfunction , ef 60-65%  . Esophagitis   . GERD (gastroesophageal reflux disease)   . Hemorrhoids   . Hiatal hernia   . Hyperlipidemia    stopped meds due to side effects  . Hypertension    09-23-2017  per pt has takes bp meds since May 2018 no insurance (losartan 120m qd, HCTZ 12.569mqd, and toprol 50 qd)  . Type 2 diabetes mellitus ([place]   09-23-2017 per pt has not taken diabetic meds since May 2018, no insurance (kombiglyze  [id number])    Past Surgical History:  Procedure Laterality Date  . CARDIOVASCULAR STRESS TEST  07/29/2009   normal nuclear study w/ no ischemia/  normal LV function and wall motion,  ef 70%  . CESAREAN SECTION  1980s  . RECTAL BIOPSY N/A 10/03/2017   Procedure: POSSIBLE BIOPSY RECTAL;  Surgeon: WhiIleana RoupD;  Location: [facility];  Service: General;  Laterality: N/A;    Family History  Problem Relation Age of Onset  . Hypertension Mother 70 33 Stroke Mother 70 64 Diabetes Maternal Aunt        s/p bilater amputation due to DM  . Asthma Father   . Colon cancer Neg Hx   . Esophageal cancer Neg Hx   . Stomach cancer Neg Hx   . Rectal cancer Neg Hx     Social History   Socioeconomic History  . Marital status: Married    Spouse name: Not on file  . Number of children: 2  . Years of education: Not on file  . Highest education level: Not on file  Occupational History  . Occupation: unemployed  Tobacco Use  . Smoking status: Never Smoker  . Smokeless tobacco: Never Used  Vaping Use  . Vaping Use: Never used  Substance and Sexual Activity  . Alcohol use: No  .  Drug use: No  . Sexual activity: Not Currently  Other Topics Concern  . Not on file  Social History Narrative   Not working currently. Retired in 2019. Previously worked in [employer] job   Married    2 children (son and daughter). They are both local   She has 1 grand-daughter and 1 grand-son   No pets (allergic)   Completed 4 years of college.   Social Determinants of Health   Financial Resource Strain:   . Difficulty of Paying Living Expenses: Not on file  Food Insecurity:   . Worried About Charity fundraiser in the Last Year: Not on file  . Ran Out of Food in the Last Year: Not on file  Transportation Needs:   . Lack of Transportation (Medical): Not on file  . Lack of Transportation (Non-Medical): Not on file  Physical Activity:   . Days of Exercise per Week: Not on file  . Minutes of  Exercise per Session: Not on file  Stress:   . Feeling of Stress : Not on file  Social Connections:   . Frequency of Communication with Friends and Family: Not on file  . Frequency of Social Gatherings with Friends and Family: Not on file  . Attends Religious Services: Not on file  . Active Member of Clubs or Organizations: Not on file  . Attends Archivist Meetings: Not on file  . Marital Status: Not on file  Intimate Partner Violence:   . Fear of Current or Ex-Partner: Not on file  . Emotionally Abused: Not on file  . Physically Abused: Not on file  . Sexually Abused: Not on file    Outpatient Medications Prior to Visit  Medication Sig Dispense Refill  . Adalimumab (HUMIRA PEN) 40 MG/0.4ML PNKT Inject 40 mg into the skin as directed. Inject 12m into skin every 5 days 6 each 12  . albuterol (VENTOLIN HFA) 108 (90 Base) MCG/ACT inhaler Inhale 1 puff into the lungs every 6 (six) hours as needed. 18 g 3  . amLODipine (NORVASC) 10 MG tablet Take 1 tablet (10 mg total) by mouth daily. 90 tablet 1  . azaTHIOprine (IMURAN) 50 MG tablet Take 2 tablets by mouth once daily 60 tablet 1  . clotrimazole-betamethasone (LOTRISONE) cream Apply 1 application topically 2 (two) times daily. 30 g 1  . dicyclomine (BENTYL) 10 MG capsule TAKE 1 CAPSULE BY MOUTH THREE TIMES DAILY AS NEEDED FOR  CRAMPING 90 capsule 2  . diphenoxylate-atropine (LOMOTIL) 2.5-0.025 MG tablet TAKE 1 TABLET BY MOUTH 4 TIMES DAILY AS NEEDED FOR DIARRHEA OR LOOSE STOOLS 120 tablet 1  . glipiZIDE (GLUCOTROL XL) 10 MG 24 hr tablet Take 1 tablet by mouth once daily with breakfast 90 tablet 1  . hydrochlorothiazide (MICROZIDE) 12.5 MG capsule TAKE 1 CAPSULE BY MOUTH ONCE DAILY FOR BLOOD PRESSURE 90 capsule 1  . hydrocortisone (ANUSOL-HC) 25 MG suppository Place 1 suppository (25 mg total) rectally at bedtime. 14 suppository 0  . metoprolol succinate (TOPROL-XL) 50 MG 24 hr tablet Take 1 tablet (50 mg total) by mouth daily. 90  tablet 3  . omeprazole (PRILOSEC) 40 MG capsule Take 1 capsule by mouth once daily 90 capsule 0  . polyethylene glycol (MIRALAX / GLYCOLAX) 17 g packet Take 17 g by mouth as needed. 1 capful prn 30 each 11  . simvastatin (ZOCOR) 10 MG tablet Take 1 tablet (10 mg total) by mouth daily. 90 tablet 3  . sitaGLIPtin (JANUVIA) 50 MG tablet  Take 1 tablet (50 mg total) by mouth daily. 30 tablet 5  . Sodium Sulfate-Mag Sulfate-KCl (SUTAB) [phone] MG TABS Take 1 kit by mouth as directed. BIN: [id number] PCN: CN GROUP: [id number] MEMBER ID: [id number]; RUN AS CASH 24 tablet 0   No facility-administered medications prior to visit.    Allergies  Allergen Reactions  . Lipitor [Atorvastatin Calcium]     Leg cramps   . Lisinopril Itching  . Pravachol [Pravastatin Sodium]   . Pravastatin Sodium Other (See Comments)    leg cramps    Review of Systems  Constitutional: Positive for fatigue. Negative for unexpected weight change.  HENT: Negative for hearing loss and rhinorrhea.   Eyes: Negative for visual disturbance.  Respiratory: Negative for cough and shortness of breath.   Cardiovascular: Positive for leg swelling. Negative for chest pain.  Gastrointestinal: Positive for diarrhea (intermittent due to crohns). Negative for constipation.  Genitourinary: Negative for dysuria and frequency.  Musculoskeletal: Positive for arthralgias (left foot).  Skin: Negative for rash.  Neurological: Negative for headaches.  Hematological: Negative for adenopathy.  Psychiatric/Behavioral:       Denies depression/anxiety   Past Medical History:  Diagnosis Date  . Asthma    09-23-2017  per pt has not done inhaler since May 2018, no insurance (QVAR bid and Ventolin prn)  . Diastolic dysfunction    per echo [phone]  grade 2 diastolic dysfunction , ef 60-65%  . Esophagitis   . GERD (gastroesophageal reflux disease)   . Hemorrhoids   . Hiatal hernia   . Hyperlipidemia    stopped meds due to side effects  .  Hypertension    09-23-2017  per pt has takes bp meds since May 2018 no insurance (losartan 114m qd, HCTZ 12.525mqd, and toprol 50 qd)  . Type 2 diabetes mellitus ([facility] [place]   09-23-2017 per pt has not taken diabetic meds since May 2018, no insurance (kombiglyze [id number])     Social History   Socioeconomic History  . Marital status: Married    Spouse name: Not on file  . Number of children: 2  . Years of education: Not on file  . Highest education level: Not on file  Occupational History  . Occupation: unemployed  Tobacco Use  . Smoking status: Never Smoker  . Smokeless tobacco: Never Used  Vaping Use  . Vaping Use: Never used  Substance and Sexual Activity  . Alcohol use: No  . Drug use: No  . Sexual activity: Not Currently  Other Topics Concern  . Not on file  Social History Narrative   Not working currently. Retired in 2019. Previously worked in a [employer]   Married    2 children (son and daughter). They are both local   She has 1 grand-daughter and 1 grand-son   No pets (allergic)   Completed 4 years of college.   Social Determinants of Health   Financial Resource Strain:   . Difficulty of Paying Living Expenses: Not on file  Food Insecurity:   . Worried About RunCharity fundraiser the Last Year: Not on file  . Ran Out of Food in the Last Year: Not on file  Transportation Needs:   . Lack of Transportation (Medical): Not on file  . Lack of Transportation (Non-Medical): Not on file  Physical Activity:   . Days of Exercise per Week: Not on file  . Minutes of Exercise per Session: Not on file  Stress:   . Feeling of Stress :  Not on file  Social Connections:   . Frequency of Communication with Friends and Family: Not on file  . Frequency of Social Gatherings with Friends and Family: Not on file  . Attends Religious Services: Not on file  . Active Member of Clubs or Organizations: Not on file  . Attends Archivist Meetings: Not on file  . Marital  Status: Not on file  Intimate Partner Violence:   . Fear of Current or Ex-Partner: Not on file  . Emotionally Abused: Not on file  . Physically Abused: Not on file  . Sexually Abused: Not on file    Past Surgical History:  Procedure Laterality Date  . CARDIOVASCULAR STRESS TEST  07/29/2009   normal nuclear study w/ no ischemia/  normal LV function and wall motion,  ef 70%  . CESAREAN SECTION  1980s  . RECTAL BIOPSY N/A 10/03/2017   Procedure: POSSIBLE BIOPSY RECTAL;  Surgeon: Ileana Roup, MD;  Location: [facility];  Service: General;  Laterality: N/A;    Family History  Problem Relation Age of Onset  . Hypertension Mother 43  . Stroke Mother 42  . Diabetes Maternal Aunt        s/p bilater amputation due to DM  . Asthma Father   . Colon cancer Neg Hx   . Esophageal cancer Neg Hx   . Stomach cancer Neg Hx   . Rectal cancer Neg Hx     Allergies  Allergen Reactions  . Lipitor [Atorvastatin Calcium]     Leg cramps   . Lisinopril Itching  . Pravachol [Pravastatin Sodium]   . Pravastatin Sodium Other (See Comments)    leg cramps    Current Outpatient Medications on File Prior to Visit  Medication Sig Dispense Refill  . Adalimumab (HUMIRA PEN) 40 MG/0.4ML PNKT Inject 40 mg into the skin as directed. Inject 3m into skin every 5 days 6 each 12  . albuterol (VENTOLIN HFA) 108 (90 Base) MCG/ACT inhaler Inhale 1 puff into the lungs every 6 (six) hours as needed. 18 g 3  . amLODipine (NORVASC) 10 MG tablet Take 1 tablet (10 mg total) by mouth daily. 90 tablet 1  . azaTHIOprine (IMURAN) 50 MG tablet Take 2 tablets by mouth once daily 60 tablet 1  . clotrimazole-betamethasone (LOTRISONE) cream Apply 1 application topically 2 (two) times daily. 30 g 1  . dicyclomine (BENTYL) 10 MG capsule TAKE 1 CAPSULE BY MOUTH THREE TIMES DAILY AS NEEDED FOR  CRAMPING 90 capsule 2  . diphenoxylate-atropine (LOMOTIL) 2.5-0.025 MG tablet TAKE 1 TABLET BY MOUTH 4 TIMES DAILY AS NEEDED FOR  DIARRHEA OR LOOSE STOOLS 120 tablet 1  . glipiZIDE (GLUCOTROL XL) 10 MG 24 hr tablet Take 1 tablet by mouth once daily with breakfast 90 tablet 1  . hydrochlorothiazide (MICROZIDE) 12.5 MG capsule TAKE 1 CAPSULE BY MOUTH ONCE DAILY FOR BLOOD PRESSURE 90 capsule 1  . hydrocortisone (ANUSOL-HC) 25 MG suppository Place 1 suppository (25 mg total) rectally at bedtime. 14 suppository 0  . metoprolol succinate (TOPROL-XL) 50 MG 24 hr tablet Take 1 tablet (50 mg total) by mouth daily. 90 tablet 3  . omeprazole (PRILOSEC) 40 MG capsule Take 1 capsule by mouth once daily 90 capsule 0  . polyethylene glycol (MIRALAX / GLYCOLAX) 17 g packet Take 17 g by mouth as needed. 1 capful prn 30 each 11  . simvastatin (ZOCOR) 10 MG tablet Take 1 tablet (10 mg total) by mouth daily. 90 tablet 3  . sitaGLIPtin (  JANUVIA) 50 MG tablet Take 1 tablet (50 mg total) by mouth daily. 30 tablet 5  . Sodium Sulfate-Mag Sulfate-KCl (SUTAB) [phone] MG TABS Take 1 kit by mouth as directed. BIN: [id number] PCN: CN GROUP: [id number] MEMBER ID: [id number]; RUN AS CASH 24 tablet 0   No current facility-administered medications on file prior to visit.    BP (!) 129/49 (BP Location: Right Arm, Patient Position: Sitting, Cuff Size: Large)   Pulse 81   Temp 98.8 F (37.1 C) (Oral)   Resp 16   Ht 4' 11"  (1.499 m)   Wt 210 lb (95.3 kg)   SpO2 100%   BMI 42.41 kg/m       Objective:    Physical Exam  BP (!) 129/49 (BP Location: Right Arm, Patient Position: Sitting, Cuff Size: Large)   Pulse 81   Temp 98.8 F (37.1 C) (Oral)   Resp 16   Ht 4' 11"  (1.499 m)   Wt 210 lb (95.3 kg)   SpO2 100%   BMI 42.41 kg/m  Wt Readings from Last 3 Encounters:  02/04/20 210 lb (95.3 kg)  12/24/19 212 lb 6 oz (96.3 kg)  10/24/19 220 lb (99.8 kg)     Lab Results  Component Value Date   TSH 2.24 10/24/2019   Lab Results  Component Value Date   WBC 7.1 12/24/2019   HGB 11.5 (L) 12/24/2019   HCT 36.7 12/24/2019   MCV 69.2  Repeated and verified X2. (L) 12/24/2019   PLT 386.0 12/24/2019   Lab Results  Component Value Date   NA 138 12/24/2019   K 3.8 12/24/2019   CO2 28 12/24/2019   GLUCOSE 154 (H) 12/24/2019   BUN 17 12/24/2019   CREATININE 1.45 (H) 12/24/2019   BILITOT 0.4 12/24/2019   ALKPHOS 83 12/24/2019   AST 10 12/24/2019   ALT 7 12/24/2019   PROT 9.1 (H) 12/24/2019   ALBUMIN 3.9 12/24/2019   CALCIUM 10.0 12/24/2019   ANIONGAP 8 10/03/2017   GFR 43.66 (L) 12/24/2019   Lab Results  Component Value Date   CHOL 130 10/24/2019   Lab Results  Component Value Date   HDL 38.80 (L) 10/24/2019   Lab Results  Component Value Date   LDLCALC 52 10/24/2019   Lab Results  Component Value Date   TRIG 192.0 (H) 10/24/2019   Lab Results  Component Value Date   CHOLHDL 3 10/24/2019   Lab Results  Component Value Date   HGBA1C 8.3 (H) 10/24/2019   Physical Exam  Constitutional: She is oriented to person, place, and time. She appears well-developed and well-nourished. No distress.  HENT:  Head: Normocephalic and atraumatic.  Right Ear: Tympanic membrane and ear canal normal.  Left Ear: Tympanic membrane and ear canal normal.  Mouth/Throat: Not examined- pt wearing mask Eyes: Pupils are equal, round, and reactive to light. No scleral icterus.  Neck: Normal range of motion. No thyromegaly present.  Cardiovascular: Normal rate and regular rhythm.   No murmur heard. Pulmonary/Chest: Effort normal and breath sounds normal. No respiratory distress. He has no wheezes. She has no rales. She exhibits no tenderness.  Abdominal: Soft. Bowel sounds are normal. She exhibits no distension and no mass. There is no tenderness. There is no rebound and no guarding.  Musculoskeletal: + swelling of the left ankle Lymphadenopathy:    She has no cervical adenopathy.  Neurological: She is alert and oriented to person, place, and time. She has normal patellar reflexes. She exhibits normal muscle tone.  Coordination normal.  Skin: Skin is warm and dry.  Psychiatric: She has a normal mood and affect. Her behavior is normal. Judgment and thought content normal.  Breast/pelvic: deferred           Assessment & Plan:  Preventative care- declines shingrix and pneumovax. Ordered mammo. Colo scheduled.  Flu shot today.  Gout- obtained uric acid level which is quite high >9.  Rx with colchicine 0.75m (2 tabs now and 1 tab in 1 hr).  DM2- obtain A1C, urine microalbumin.  Continue glipizide 174mand januvia.  Lab Results  Component Value Date   HGBA1C 6.5 (H) 02/04/2020   Follow up A1C looks much better. Continue as above- referral to nutrition.   Fatigue- new. Check CBC, TSH.    Assessment & Plan:   Problem List Items Addressed This Visit    None    Visit Diagnoses    Needs flu shot    -  Primary   Relevant Orders   Flu Vaccine QUAD High Dose(Fluad) (Completed)       No orders of the defined types were placed in this encounter.    MeNance PearNP

## 2020-02-04 NOTE — Patient Instructions (Addendum)
Please begin colchicine for probable gout in your left foot. Please let me know if your pain worsens or if it is not improved in 1-2 days.  You should be contacted about scheduling your appointment with the nutritionist.

## 2020-02-06 NOTE — Addendum Note (Signed)
Addended by: Kelle Darting A on: 02/06/2020 07:41 AM   Modules accepted: Orders

## 2020-02-11 ENCOUNTER — Telehealth: Payer: Self-pay | Admitting: Family

## 2020-02-11 LAB — CBC WITH DIFFERENTIAL/PLATELET
Absolute Monocytes: 801 cells/uL (ref 200–950)
Basophils Absolute: 64 cells/uL (ref 0–200)
Basophils Relative: 0.7 %
Eosinophils Absolute: 137 cells/uL (ref 15–500)
Eosinophils Relative: 1.5 %
HCT: 34.1 % — ABNORMAL LOW (ref 35.0–45.0)
Hemoglobin: 10.7 g/dL — ABNORMAL LOW (ref 11.7–15.5)
Lymphs Abs: 764 cells/uL — ABNORMAL LOW (ref 850–3900)
MCH: 21.8 pg — ABNORMAL LOW (ref 27.0–33.0)
MCHC: 31.4 g/dL — ABNORMAL LOW (ref 32.0–36.0)
MCV: 69.6 fL — ABNORMAL LOW (ref 80.0–100.0)
MPV: 9.6 fL (ref 7.5–12.5)
Monocytes Relative: 8.8 %
Neutro Abs: 7335 cells/uL (ref 1500–7800)
Neutrophils Relative %: 80.6 %
Platelets: 438 10*3/uL — ABNORMAL HIGH (ref 140–400)
RBC: 4.9 10*6/uL (ref 3.80–5.10)
RDW: 17.7 % — ABNORMAL HIGH (ref 11.0–15.0)
Total Lymphocyte: 8.4 %
WBC: 9.1 10*3/uL (ref 3.8–10.8)

## 2020-02-11 LAB — COMPREHENSIVE METABOLIC PANEL
AG Ratio: 0.8 (calc) — ABNORMAL LOW (ref 1.0–2.5)
ALT: 5 U/L — ABNORMAL LOW (ref 6–29)
AST: 9 U/L — ABNORMAL LOW (ref 10–35)
Albumin: 3.6 g/dL (ref 3.6–5.1)
Alkaline phosphatase (APISO): 73 U/L (ref 37–153)
BUN/Creatinine Ratio: 9 (calc) (ref 6–22)
BUN: 13 mg/dL (ref 7–25)
CO2: 30 mmol/L (ref 20–32)
Calcium: 9.6 mg/dL (ref 8.6–10.4)
Chloride: 96 mmol/L — ABNORMAL LOW (ref 98–110)
Creat: 1.49 mg/dL — ABNORMAL HIGH (ref 0.50–0.99)
Globulin: 4.5 g/dL (calc) — ABNORMAL HIGH (ref 1.9–3.7)
Glucose, Bld: 200 mg/dL — ABNORMAL HIGH (ref 65–99)
Potassium: 3.5 mmol/L (ref 3.5–5.3)
Sodium: 136 mmol/L (ref 135–146)
Total Bilirubin: 0.5 mg/dL (ref 0.2–1.2)
Total Protein: 8.1 g/dL (ref 6.1–8.1)

## 2020-02-11 LAB — TEST AUTHORIZATION

## 2020-02-11 LAB — PROTEIN ELECTROPHORESIS, SERUM, WITH REFLEX
Albumin ELP: 3.9 g/dL (ref 3.8–4.8)
Alpha 1: 0.6 g/dL — ABNORMAL HIGH (ref 0.2–0.3)
Alpha 2: 1.2 g/dL — ABNORMAL HIGH (ref 0.5–0.9)
Beta 2: 0.5 g/dL (ref 0.2–0.5)
Beta Globulin: 0.6 g/dL (ref 0.4–0.6)
Gamma Globulin: 2.7 g/dL — ABNORMAL HIGH (ref 0.8–1.7)
Total Protein: 9.5 g/dL — ABNORMAL HIGH (ref 6.1–8.1)

## 2020-02-11 LAB — HEMOGLOBIN A1C
Hgb A1c MFr Bld: 6.5 % of total Hgb — ABNORMAL HIGH (ref <5.7)
Mean Plasma Glucose: 140 (calc)
eAG (mmol/L): 7.7 (calc)

## 2020-02-11 LAB — TSH: TSH: 2.89 mIU/L (ref 0.40–4.50)

## 2020-02-11 MED ORDER — MULTI-VITAMIN/MINERALS PO TABS: 1.0000 | ORAL_TABLET | Freq: Every day | ORAL | Status: AC

## 2020-02-11 NOTE — Telephone Encounter (Signed)
See mychart.  

## 2020-02-13 LAB — HM DIABETES EYE EXAM

## 2020-02-15 ENCOUNTER — Other Ambulatory Visit: Payer: Self-pay

## 2020-02-15 ENCOUNTER — Encounter: Payer: Self-pay | Admitting: Internal Medicine

## 2020-02-15 ENCOUNTER — Ambulatory Visit: Payer: PPO | Admitting: Internal Medicine

## 2020-02-15 VITALS — BP 153/50 | HR 74 | Temp 97.1°F | Ht <= 58 in | Wt 212.0 lb

## 2020-02-15 DIAGNOSIS — K50119 Crohn's disease of large intestine with unspecified complications: Secondary | ICD-10-CM

## 2020-02-15 MED ORDER — SODIUM CHLORIDE 0.9 % IV SOLN: 500.0000 mL | INTRAVENOUS | Status: DC

## 2020-02-15 NOTE — Progress Notes (Addendum)
Notified Dr. Hilarie Fredrickson of pt soft, mushy stool . will reschedule.  1325- sarah Monday discussed reschedule with patient. Dr. Hilarie Fredrickson will come talk to pt.

## 2020-02-16 ENCOUNTER — Other Ambulatory Visit: Payer: Self-pay | Admitting: Internal Medicine

## 2020-03-13 ENCOUNTER — Encounter: Payer: Self-pay | Admitting: Internal Medicine

## 2020-03-13 ENCOUNTER — Ambulatory Visit: Payer: PPO | Admitting: Internal Medicine

## 2020-03-13 VITALS — BP 120/64 | HR 71 | Ht 59.0 in | Wt 208.0 lb

## 2020-03-13 DIAGNOSIS — K5900 Constipation, unspecified: Secondary | ICD-10-CM | POA: Diagnosis not present

## 2020-03-13 DIAGNOSIS — K50111 Crohn's disease of large intestine with rectal bleeding: Secondary | ICD-10-CM

## 2020-03-13 DIAGNOSIS — K50119 Crohn's disease of large intestine with unspecified complications: Secondary | ICD-10-CM

## 2020-03-13 DIAGNOSIS — K219 Gastro-esophageal reflux disease without esophagitis: Secondary | ICD-10-CM | POA: Diagnosis not present

## 2020-03-13 MED ORDER — PLENVU 140 G PO SOLR: 1.0000 | ORAL | 0 refills | Status: DC

## 2020-03-13 MED ORDER — METOCLOPRAMIDE HCL 10 MG PO TABS: 10.0000 mg | ORAL_TABLET | ORAL | 0 refills | Status: DC

## 2020-03-13 NOTE — Progress Notes (Signed)
Subjective:    Patient ID: Kathy [name], female    DOB: 05/24/1955, 64 y.o.   MRN: [id number]  HPI [name] is a 64 year old female with a history of colonic and perianal Crohn's disease with history of perianal fistula, hypertension, hyperlipidemia, diabetes and asthma who is here for follow-up.  I last saw her on 12/24/2019.  She is here alone today.  She came for attempted colonoscopy on 02/15/2020 but could not tolerate Sutab prep.  MiraLAX prep was recommended after Sutab cause severe projectile vomiting but she was unable to adequately cleanse the colon.  Colonoscopy was not performed.  She returns today for follow-up.  She reports on the whole she continues to feel "better" but still there are times when she has abdominal discomfort.  Her bowel movements now have tended towards constipation and she can have hard small stools and incomplete evacuation.  Occasionally red blood with wiping.  She sees mucus in her stool.  She is using MiraLAX but not daily.  After the partial bowel prep it took for 5 days for stools to return to usual but she did eventually feel more evacuated before again developing a more constipated feeling.  She continues Humira 40 mg every 5 days, azathioprine 100 mg daily and omeprazole 40 mg daily.  She is not using Lomotil.  She rarely uses dicyclomine.   Review of Systems As per HPI, otherwise negative  Current Medications, Allergies, Past Medical History, Past Surgical History, Family History and Social History were reviewed in [employer] record.     Objective:   Physical Exam BP 120/64   Pulse 71   Ht 4' 11"  (1.499 m)   Wt 208 lb (94.3 kg)   BMI 42.01 kg/m  Gen: awake, alert, NAD HEENT: anicteric Neuro: nonfocal  CBC    Component Value Date/Time   WBC 9.1 02/04/2020 1114   RBC 4.90 02/04/2020 1114   HGB 10.7 (L) 02/04/2020 1114   HGB 11.4 10/11/2018 1009   HCT 34.1 (L) 02/04/2020 1114   HCT 36.7  10/11/2018 1009   PLT 438 (H) 02/04/2020 1114   PLT 350 10/11/2018 1009   MCV 69.6 (L) 02/04/2020 1114   MCV 72 (L) 10/11/2018 1009   MCH 21.8 (L) 02/04/2020 1114   MCHC 31.4 (L) 02/04/2020 1114   RDW 17.7 (H) 02/04/2020 1114   RDW 13.2 10/11/2018 1009   LYMPHSABS 764 (L) 02/04/2020 1114   MONOABS 0.6 12/24/2019 1510   EOSABS 137 02/04/2020 1114   BASOSABS 64 02/04/2020 1114   CMP     Component Value Date/Time   NA 136 02/04/2020 1114   NA 139 10/11/2018 1009   K 3.5 02/04/2020 1114   CL 96 (L) 02/04/2020 1114   CO2 30 02/04/2020 1114   GLUCOSE 200 (H) 02/04/2020 1114   BUN 13 02/04/2020 1114   BUN 14 10/11/2018 1009   CREATININE 1.49 (H) 02/04/2020 1114   CALCIUM 9.6 02/04/2020 1114   PROT 8.1 02/04/2020 1114   PROT 9.5 (H) 02/04/2020 1114   PROT 7.4 10/11/2018 1009   ALBUMIN 3.9 12/24/2019 1510   ALBUMIN 3.5 (L) 10/11/2018 1009   AST 9 (L) 02/04/2020 1114   ALT 5 (L) 02/04/2020 1114   ALKPHOS 83 12/24/2019 1510   BILITOT 0.5 02/04/2020 1114   BILITOT 0.3 10/11/2018 1009   GFRNONAA 42 (L) 10/11/2018 1009   GFRAA 48 (L) 10/11/2018 1009        Assessment & Plan:   64 year old female with  a history of colonic and perianal Crohn's disease with history of perianal fistula, hypertension, hyperlipidemia, diabetes and asthma who is here for follow-up.  1.  Colonic and perianal Crohn's disease with history of fistula --I do feel that her Crohn's disease is improving with combination therapy Humira plus azathioprine.  In order to reach a therapeutic Humira level and improve symptoms were dosing Humira every 5 days.  I am still interested in recommending we perform colonoscopy to evaluate disease activity but she was unable to tolerate the prep recently.  I also think she is having some constipation which we can work on and overall improve symptoms --Continue Humira 40 mg every 5 days and azathioprine 100 mg daily --Begin MiraLAX 17 g daily --Plan colonoscopy in January with  liquid base prep, Plenvu, and split fashion with Reglan 10 mg before each after the prep  2. GERD with history of esophagitis -continue omeprazole 40 mg daily  30 minutes total spent today including patient facing time, coordination of care, reviewing medical history/procedures/pertinent radiology studies, and documentation of the encounter.

## 2020-03-13 NOTE — Patient Instructions (Signed)
You have been scheduled for a colonoscopy. Please follow written instructions given to you at your visit today.  Please pick up your prep supplies at the pharmacy within the next 1-3 days. If you use inhalers (even only as needed), please bring them with you on the day of your procedure.  Continue Azathioprine.  Continue Humira.  Continue omeprazole.  Please purchase the following medications over the counter and take as directed: Miralax 17 grams once daily  If you are age 31 or older, your body mass index should be between 23-30. Your Body mass index is 42.01 kg/m. If this is out of the aforementioned range listed, please consider follow up with your Primary Care Provider.  Due to recent changes in healthcare laws, you may see the results of your imaging and laboratory studies on MyChart before your provider has had a chance to review them.  We understand that in some cases there may be results that are confusing or concerning to you. Not all laboratory results come back in the same time frame and the provider may be waiting for multiple results in order to interpret others.  Please give [place] 48 hours in order for your provider to thoroughly review all the results before contacting the office for clarification of your results.

## 2020-03-17 ENCOUNTER — Encounter: Payer: Self-pay | Admitting: Registered"

## 2020-03-17 ENCOUNTER — Other Ambulatory Visit: Payer: Self-pay

## 2020-03-17 ENCOUNTER — Encounter: Payer: PPO | Attending: Family | Admitting: Registered"

## 2020-03-17 DIAGNOSIS — E119 Type 2 diabetes mellitus without complications: Secondary | ICD-10-CM

## 2020-03-17 NOTE — Progress Notes (Signed)
Diabetes Self-Management Education  Visit Type: First/Initial  Appt. Start Time: 0905 Appt. End Time: 1010  03/17/2020  [name], identified by name and date of birth, is a 64 y.o. female with a diagnosis of Diabetes: Type 2.   ASSESSMENT  There were no vitals taken for this visit. There is no height or weight on file to calculate BMI.   Pt is concerned about what to eat for Diabetes as well as Crohn's disease. Pt states she doesn't know what she is eating that is causing flares. Pt states she is followed by GI doctor.  Pt states she also wants to understand A1c vs CBGs and what her target range should be.   Pt states she does not have much of an appetite for last 2 yrs but forces herself to eat to take medicine and to avoid hypoglycemiat. Pt reports hypoglycemic symptoms but does not check blood sugar, she just can tell she needs to eat.  Pt states she doesn't eat right and that's why she weighs too much and is creating issues with blood sugar. Pt sates she stopped buying things that she likes but is high sodium.  Pt states she doesn't usually get up early, especially if she doesn't feel well. Pt states she uses FBS reading to determine what she should eat for breakfast. Pt states she does not have an appetite during the day and usually does not eat until 5 pm.  Pt states she is afraid of going out due to [place] and her children will not come by if they have been around other people that might expose patient to [place].    Sample provided: Daiva Huge # [id number] exp: 02/24/21   Diabetes Self-Management Education - 03/17/20 0913      Visit Information   Visit Type First/Initial      Initial Visit   Diabetes Type Type 2    Are you taking your medications as prescribed? Yes    Date Diagnosed 2000      Health Coping   How would you rate your overall health? Fair      Psychosocial Assessment   Patient Belief/Attitude about Diabetes Motivated to manage diabetes     Patient Concerns Other (comment);Weight Control   crohn's disease   How often do you need to have someone help you when you read instructions, pamphlets, or other written materials from your doctor or pharmacy? 1 - Never    What is the last grade level you completed in school? associates 2 yr      Complications   Last HgB A1C per patient/outside source 6.5 %    How often do you check your blood sugar? 3-4 times / week    Fasting Blood glucose range (mg/dL) --   doesn't remember   Number of hypoglycemic episodes per month 1    Can you tell when your blood sugar is low? Yes    What do you do if your blood sugar is low? eats oatmeal    Have you had a dilated eye exam in the past 12 months? Yes    Have you had a dental exam in the past 12 months? No    Are you checking your feet? Yes    How many days per week are you checking your feet? 2      Dietary Intake   Breakfast oatmeal, cup of fruit OR boiled egg, cheese toast    Snack (morning) none    Dinner stouffer's microwave OR baked  chicken, starch (1 c rice, or med potato), green beans    Snack (evening) eat more of the dinner    Beverage(s) water, occasionally juice      Exercise   Exercise Type ADL's    How many days per week to you exercise? 0    How many minutes per day do you exercise? 0    Total minutes per week of exercise 0      Patient Education   Previous Diabetes Education Yes (please comment)    Nutrition management  Role of diet in the treatment of diabetes and the relationship between the three main macronutrients and blood glucose level;Meal options for control of blood glucose level and chronic complications.    Medications Other (comment)   hypoglycemic risk with glipizide   Monitoring Identified appropriate SMBG and/or A1C goals.    Acute complications Taught treatment of hypoglycemia - the 15 rule.;Discussed and identified patients' treatment of hyperglycemia.      Individualized Goals (developed by patient)    Nutrition Follow meal plan discussed    Medications Other (comment)   don't skip meals due to glipizide   Monitoring  send in my blood glucose log as discussed      Outcomes   Expected Outcomes Demonstrated interest in learning. Expect positive outcomes    Future DMSE 4-6 wks    Program Status Not Completed           Individualized Plan for Diabetes Self-Management Training:   Learning Objective:  Patient will have a greater understanding of diabetes self-management. Patient education plan is to attend individual and/or group sessions per assessed needs and concerns.   Patient Instructions  Consider using the Meal plan as a guide for lower fiber meals with modifications for diabetes. As your Crohn's symptoms improve you can increase fiber. Bring your blood sugar log to your follow-up visit.   Expected Outcomes:  Demonstrated interest in learning. Expect positive outcomes  Education material provided: ADA - How to Thrive: A Guide for Your Journey with Diabetes, AND Meal plans to Slowly increase fiber with IBD, A1c chart  If problems or questions, patient to contact team via:  Phone  Future DSME appointment: 4-6 wks

## 2020-03-17 NOTE — Patient Instructions (Addendum)
Consider using the Meal plan as a guide for lower fiber meals with modifications for diabetes. As your Crohn's symptoms improve you can increase fiber. Bring your blood sugar log to your follow-up visit.

## 2020-03-18 ENCOUNTER — Telehealth: Payer: Self-pay | Admitting: Internal Medicine

## 2020-03-18 NOTE — Telephone Encounter (Signed)
Pt states she is taking a daily dose of miralax and still having issues with constipation. Reports she has the urge to go along with some stomach discomfort but only passes a small amount of stool with mucous. Advised pt that she can increase the miralax to 1-3 doses daily to help with constipation. Pt verbalized understanding and knows to call back with any other issues.

## 2020-04-01 ENCOUNTER — Other Ambulatory Visit: Payer: Self-pay | Admitting: Internal Medicine

## 2020-04-21 ENCOUNTER — Other Ambulatory Visit: Payer: Self-pay | Admitting: Family

## 2020-04-21 DIAGNOSIS — E119 Type 2 diabetes mellitus without complications: Secondary | ICD-10-CM

## 2020-05-02 ENCOUNTER — Other Ambulatory Visit: Payer: Self-pay | Admitting: Family

## 2020-05-05 ENCOUNTER — Ambulatory Visit: Payer: PPO | Admitting: Registered"

## 2020-05-06 DIAGNOSIS — Z20822 Contact with and (suspected) exposure to covid-19: Secondary | ICD-10-CM | POA: Diagnosis not present

## 2020-05-07 ENCOUNTER — Other Ambulatory Visit: Payer: Self-pay

## 2020-05-07 ENCOUNTER — Telehealth (INDEPENDENT_AMBULATORY_CARE_PROVIDER_SITE_OTHER): Payer: PPO | Admitting: Family

## 2020-05-07 DIAGNOSIS — U071 COVID-19: Secondary | ICD-10-CM

## 2020-05-07 DIAGNOSIS — H109 Unspecified conjunctivitis: Secondary | ICD-10-CM

## 2020-05-07 MED ORDER — NEOMYCIN-POLYMYXIN-HC 3.5-10000-1 OP SUSP: 3.0000 [drp] | Freq: Four times a day (QID) | OPHTHALMIC | 0 refills | Status: AC

## 2020-05-07 NOTE — Progress Notes (Signed)
Virtual Visit via Telephone Note  I connected with Kathy [name] on 05/07/20 at 10:20 AM EST by telephone and verified that I am speaking with the correct person using two identifiers.  Location: Patient: home Provider: work    I discussed the limitations, risks, security and privacy concerns of performing an evaluation and management service by telephone and the availability of in person appointments. I also discussed with the patient that there may be a patient responsible charge related to this service. The patient expressed understanding and agreed to proceed.   History of Present Illness:  Patient reports that on 12/31 she developed a sore throat. Never had a fever, but did develop nasal congestion and cough. She reports that she had a rapid covid test on 04/28/2019 which was positive and had a PCR performed yesterday at [facility] which is still pending.    Reports that each day she feels a little bit better. Congestion is improving. Initially lost her appetite and taste- but improving.  She still has some loss of smell.  She denies SOB.   Reports left eye irritation/burning/pink. States that it began 3-4 days. Some discharge/matting when she wakes up in the. She has been using some otc drops without much improvement.     Observations/Objective:   Gen: Awake, alert, no acute distress Resp: Breathing sounds even and non-labored Psych: calm/pleasant demeanor Neuro: Alert and Oriented x 3,  speech is clear.   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  However she is outside of the window for monoclonal antibody infusion. She seems to be continuing to improve clinically. Recommended tylenol as needed for pain, mucinex as needed for congestion. She is advised to go to the ER if she develops increased weakness, SOB or inability to eat/drink.  She will let me know if her symptoms do not continue to improve.  Conjunctivitis- will rx with cortisporin  drops.     Follow Up Instructions:    I discussed the assessment and treatment plan with the patient. The patient was provided an opportunity to ask questions and all were answered. The patient agreed with the plan and demonstrated an understanding of the instructions.   The patient was advised to call back or seek an in-person evaluation if the symptoms worsen or if the condition fails to improve as anticipated.  I provided 15 minutes of non-face-to-face time during this encounter.   Nance Pear, NP

## 2020-05-16 ENCOUNTER — Telehealth: Payer: Self-pay | Admitting: Family

## 2020-05-16 MED ORDER — SIMVASTATIN 10 MG PO TABS: 10.0000 mg | ORAL_TABLET | Freq: Every day | ORAL | 3 refills | Status: DC

## 2020-05-16 NOTE — Telephone Encounter (Signed)
Medication:  simvastatin (ZOCOR) 10 MG tablet [[id number]]      Has the patient contacted their pharmacy?  (If no, request that the patient contact the pharmacy for the refill.) (If yes, when and what did the pharmacy advise?)     Preferred Pharmacy (with phone number or street name):  [facility] [address]  [place], [place] [address]  Phone:  [phone] Fax:  [phone]     Agent: Please be advised that RX refills may take up to 3 business days. We ask that you follow-up with your pharmacy.

## 2020-05-16 NOTE — Telephone Encounter (Signed)
Medication has been refilled.

## 2020-05-21 ENCOUNTER — Telehealth: Payer: Self-pay | Admitting: Family

## 2020-05-21 DIAGNOSIS — I10 Essential (primary) hypertension: Secondary | ICD-10-CM

## 2020-05-21 MED ORDER — METOPROLOL SUCCINATE ER 50 MG PO TB24: 50.0000 mg | ORAL_TABLET | Freq: Every day | ORAL | 1 refills | Status: DC

## 2020-05-21 MED ORDER — ALBUTEROL SULFATE HFA 108 (90 BASE) MCG/ACT IN AERS: 1.0000 | INHALATION_SPRAY | Freq: Four times a day (QID) | RESPIRATORY_TRACT | 5 refills | Status: DC | PRN

## 2020-05-21 NOTE — Telephone Encounter (Signed)
Medication: albuterol (VENTOLIN HFA) 108 (90 Base) MCG/ACT  metoprolol succinate (TOPROL-XL) 50 MG 24 hr tablet [[id number]]     Has the patient contacted their pharmacy? no (If no, request that the patient contact the pharmacy for the refill.) (If yes, when and what did the pharmacy advise?)    Preferred Pharmacy (with phone number or street name):   [facility]  [place], [place] [address]     Agent: Please be advised that RX refills may take up to 3 business days. We ask that you follow-up with your pharmacy.

## 2020-05-21 NOTE — Telephone Encounter (Signed)
Refills sent

## 2020-05-22 ENCOUNTER — Ambulatory Visit (AMBULATORY_SURGERY_CENTER): Payer: PPO | Admitting: Internal Medicine

## 2020-05-22 ENCOUNTER — Encounter: Payer: Self-pay | Admitting: Internal Medicine

## 2020-05-22 ENCOUNTER — Other Ambulatory Visit: Payer: Self-pay

## 2020-05-22 VITALS — BP 123/52 | HR 82 | Temp 97.5°F | Resp 24 | Ht 59.0 in | Wt 208.0 lb

## 2020-05-22 DIAGNOSIS — K5090 Crohn's disease, unspecified, without complications: Secondary | ICD-10-CM | POA: Diagnosis not present

## 2020-05-22 DIAGNOSIS — K50119 Crohn's disease of large intestine with unspecified complications: Secondary | ICD-10-CM | POA: Diagnosis not present

## 2020-05-22 DIAGNOSIS — K529 Noninfective gastroenteritis and colitis, unspecified: Secondary | ICD-10-CM | POA: Diagnosis not present

## 2020-05-22 DIAGNOSIS — K5190 Ulcerative colitis, unspecified, without complications: Secondary | ICD-10-CM | POA: Diagnosis not present

## 2020-05-22 MED ORDER — SODIUM CHLORIDE 0.9 % IV SOLN: 500.0000 mL | INTRAVENOUS | Status: DC

## 2020-05-22 NOTE — Progress Notes (Signed)
Pt's states no medical or surgical changes since  office visit.

## 2020-05-22 NOTE — Progress Notes (Signed)
Called to room to assist during endoscopic procedure.  Patient ID and intended procedure confirmed with present staff. Received instructions for my participation in the procedure from the performing physician.  

## 2020-05-22 NOTE — Patient Instructions (Signed)
Resume present medications RN in office will contact you with different medications change Await pathology results   YOU HAD AN ENDOSCOPIC PROCEDURE TODAY AT [facility]:   Refer to the procedure report that was given to you for any specific questions about what was found during the examination.  If the procedure report does not answer your questions, please call your gastroenterologist to clarify.  If you requested that your care partner not be given the details of your procedure findings, then the procedure report has been included in a sealed envelope for you to review at your convenience later.  YOU SHOULD EXPECT: Some feelings of bloating in the abdomen. Passage of more gas than usual.  Walking can help get rid of the air that was put into your GI tract during the procedure and reduce the bloating. If you had a lower endoscopy (such as a colonoscopy or flexible sigmoidoscopy) you may notice spotting of blood in your stool or on the toilet paper. If you underwent a bowel prep for your procedure, you may not have a normal bowel movement for a few days.  Please Note:  You might notice some irritation and congestion in your nose or some drainage.  This is from the oxygen used during your procedure.  There is no need for concern and it should clear up in a day or so.  SYMPTOMS TO REPORT IMMEDIATELY:   Following lower endoscopy (colonoscopy or flexible sigmoidoscopy):  Excessive amounts of blood in the stool  Significant tenderness or worsening of abdominal pains  Swelling of the abdomen that is new, acute  Fever of 100F or higher  For urgent or emergent issues, a gastroenterologist can be reached at any hour by calling [phone]. Do not use MyChart messaging for urgent concerns.   DIET:  We do recommend a small meal at first, but then you may proceed to your regular diet.  Drink plenty of fluids but you should avoid alcoholic beverages for 24 hours.  ACTIVITY:  You  should plan to take it easy for the rest of today and you should NOT DRIVE or use heavy machinery until tomorrow (because of the sedation medicines used during the test).    FOLLOW UP: Our staff will call the number listed on your records 48-72 hours following your procedure to check on you and address any questions or concerns that you may have regarding the information given to you following your procedure. If we do not reach you, we will leave a message.  We will attempt to reach you two times.  During this call, we will ask if you have developed any symptoms of COVID 19. If you develop any symptoms (ie: fever, flu-like symptoms, shortness of breath, cough etc.) before then, please call [phone].  If you test positive for Covid 19 in the 2 weeks post procedure, please call and report this information to [place].    If any biopsies were taken you will be contacted by phone or by letter within the next 1-3 weeks.  Please call us at [phone] if you have not heard about the biopsies in 3 weeks.   SIGNATURES/CONFIDENTIALITY: You and/or your care partner have signed paperwork which will be entered into your electronic medical record.  These signatures attest to the fact that that the information above on your After Visit Summary has been reviewed and is understood.  Full responsibility of the confidentiality of this discharge information lies with you and/or your care-partner.

## 2020-05-22 NOTE — Op Note (Signed)
[facility] Patient Name: Kathy [name] Procedure Date: 05/22/2020 1:36 PM MRN: [id number] Endoscopist: Jerene Bears , MD Age: 66 Referring MD:  Date of Birth: [birth year] Gender: Female Account #: [id number] Procedure:                Colonoscopy Indications:              Disease activity assessment of Crohn's disease of                            the colon with patient on Humira 40 mg every 5 days                            and azathioprine 100 mg daily Medicines:                Monitored Anesthesia Care Procedure:                Pre-Anesthesia Assessment:                           - Prior to the procedure, a History and Physical                            was performed, and patient medications and                            allergies were reviewed. The patient's tolerance of                            previous anesthesia was also reviewed. The risks                            and benefits of the procedure and the sedation                            options and risks were discussed with the patient.                            All questions were answered, and informed consent                            was obtained. Prior Anticoagulants: The patient has                            taken no previous anticoagulant or antiplatelet                            agents. ASA Grade Assessment: III - A patient with                            severe systemic disease. After reviewing the risks                            and benefits, the patient was deemed in  satisfactory condition to undergo the procedure.                           After obtaining informed consent, the colonoscope                            was passed under direct vision. Throughout the                            procedure, the patient's blood pressure, pulse, and                            oxygen saturations were monitored continuously. The                            Olympus PCF-[id number]  [id number]) Colonoscope was                            introduced through the anus and advanced to the                            cecum, identified by appendiceal orifice and                            ileocecal valve. The colonoscopy was performed                            without difficulty. The patient tolerated the                            procedure well. The quality of the bowel                            preparation was fair. The ileocecal valve,                            appendiceal orifice, and rectum were photographed. Scope In: 1:51:58 PM Scope Out: 2:09:44 PM Scope Withdrawal Time: 0 hours 14 minutes 35 seconds  Total Procedure Duration: 0 hours 17 minutes 46 seconds  Findings:                 The perianal exam findings include anal canal                            stenosis, perianal skin thickening, and linear                            ulceration on skin at intragluteal cleft.                           The Simple Endoscopic Score for Crohn's Disease was                            determined based on the endoscopic appearance of  the mucosa in the following segments:                           - Ileum: Findings include no ulcers present, no                            ulcerated surfaces, no affected surfaces and no                            narrowings. Segment score: 0.                           - Right Colon: Findings include no ulcers present,                            no ulcerated surfaces, no affected surfaces and no                            narrowings. Segment score: 0.                           - Transverse Colon: Findings include no ulcers                            present, no ulcerated surfaces, no affected                            surfaces and no narrowings. Segment score: 0.                           - Left Colon: Findings include no ulcers present,                            greater than 30% ulcerated surfaces, greater than                             75% of surfaces affected and no narrowings. Segment                            score: 6.                           - Rectum: Findings include large ulcers 0.5-2 cm in                            size, greater than 30% ulcerated surfaces, greater                            than 75% of surfaces affected and no narrowings.                            Segment score: 8.                           - Total  SES-CD aggregate score: 14. Biopsies were                            taken with a cold forceps for histology.                           Retroflexion in the rectum was not performed due to                            severity of proctitis. Complications:            No immediate complications. Estimated Blood Loss:     Estimated blood loss was minimal. Impression:               - Preparation of the colon was fair. This is                            secondary to severity of Crohn's in the distal                            colon/rectum.                           - Anal canal stenosis with thickened perianal skin                            and ulceration of intragluteal cleft found on                            perianal exam.                           - Normal mucosa in the cecum, ascending colon,                            transverse colon and descending colon.                           - Mild colitis with pinpoint ulceration and                            erythema and loss of vascular pattern in the                            proximal sigmoid colon. Biopsied.                           - Moderate colitis with erythema, edema,                            granularity, ulceration and loss of vascularity in                            the mid sigmoid colon. Biopsied.                           -  Severe colitis and proctitis with erythema,                            edema, granularity, large ulceration and loss of                            vascularity in the distal sigmoid, rectosigmoid  and                            rectum. Biopsied. Recommendation:           - Patient has a contact number available for                            emergencies. The signs and symptoms of potential                            delayed complications were discussed with the                            patient. Return to normal activities tomorrow.                            Written discharge instructions were provided to the                            patient.                           - Resume previous diet.                           - Continue present medications. Given significant                            activity in proctosigmoid colitis and perianal                            Crohn's disease we need to change therapy. We very                            likely need surgical opinion as well. She has been                            on Humira 40 mg every 5 days, dose necessary to                            achieve only a modest trough adalimumab level                            without serum antibodies and azathioprine 100 mg                            daily. I would recommend we change to infliximab  therapy at 10 mg/kg.                           - Await pathology results.                           - Office follow-up with me next available, no                            sooner than one week to allow pathology results.                           - Repeat colonoscopy is recommended. The                            colonoscopy date will be determined after pathology                            results from today's exam become available for                            review. Jerene Bears, MD 05/22/2020 2:23:10 PM This report has been signed electronically.

## 2020-05-22 NOTE — Progress Notes (Signed)
PT taken to PACU. Monitors in place. VSS. Report given to RN. 

## 2020-05-26 ENCOUNTER — Telehealth: Payer: Self-pay

## 2020-05-26 ENCOUNTER — Telehealth: Payer: Self-pay | Admitting: *Deleted

## 2020-05-26 NOTE — Telephone Encounter (Signed)
  Follow up Call-  Call back number 05/22/2020 02/15/2020 10/19/2018 01/18/2018  Post procedure Call Back phone  # [phone]  Permission to leave phone message Yes Yes Yes Yes  Some recent data might be hidden     1st follow up call made.  NAULM

## 2020-05-26 NOTE — Telephone Encounter (Signed)
No answer for post procedure call back. Unable to leave message.

## 2020-05-27 ENCOUNTER — Other Ambulatory Visit: Payer: Self-pay

## 2020-05-27 ENCOUNTER — Telehealth: Payer: Self-pay | Admitting: Family

## 2020-05-27 ENCOUNTER — Other Ambulatory Visit: Payer: Self-pay | Admitting: Internal Medicine

## 2020-05-27 DIAGNOSIS — K50119 Crohn's disease of large intestine with unspecified complications: Secondary | ICD-10-CM

## 2020-05-27 MED ORDER — COLCHICINE 0.6 MG PO TABS: ORAL_TABLET | ORAL | 1 refills | Status: AC

## 2020-05-27 NOTE — Telephone Encounter (Signed)
Medication:colchicine 0.6 MG tablet [[id number]]       Has the patient contacted their pharmacy?  (If no, request that the patient contact the pharmacy for the refill.) (If yes, when and what did the pharmacy advise?)     Preferred Pharmacy (with phone number or street name): [facility], [address]  [place], [place] [address]  Phone:  [phone] Fax:  [phone]      Agent: Please be advised that RX refills may take up to 3 business days. We ask that you follow-up with your pharmacy.

## 2020-05-27 NOTE — Telephone Encounter (Signed)
Patient complains of left ankle pain with swelling w/o injury. She is requesting a refill on colchicine.

## 2020-05-28 ENCOUNTER — Other Ambulatory Visit: Payer: PPO

## 2020-05-28 DIAGNOSIS — K50119 Crohn's disease of large intestine with unspecified complications: Secondary | ICD-10-CM | POA: Diagnosis not present

## 2020-05-28 NOTE — Telephone Encounter (Signed)
Patient advised of new rx and to call for appointment if no imporvement

## 2020-05-28 NOTE — Addendum Note (Signed)
Addended by: Jacob Moores on: 05/28/2020 12:27 PM   Modules accepted: Orders

## 2020-05-29 ENCOUNTER — Other Ambulatory Visit: Payer: Self-pay | Admitting: Family

## 2020-05-30 LAB — QUANTIFERON-TB GOLD PLUS
Mitogen-NIL: 2.24 IU/mL
NIL: 0.03 IU/mL
QuantiFERON-TB Gold Plus: NEGATIVE
TB1-NIL: 0.01 IU/mL
TB2-NIL: 0.01 IU/mL

## 2020-05-30 LAB — HEPATITIS B SURFACE ANTIBODY,QUALITATIVE: Hep B S Ab: NONREACTIVE

## 2020-05-30 LAB — HEPATITIS B SURFACE ANTIGEN: Hepatitis B Surface Ag: NONREACTIVE

## 2020-05-30 LAB — HEPATITIS B CORE ANTIBODY, TOTAL: Hep B Core Total Ab: NONREACTIVE

## 2020-06-02 ENCOUNTER — Encounter: Payer: Self-pay | Admitting: Internal Medicine

## 2020-06-18 ENCOUNTER — Telehealth: Payer: Self-pay

## 2020-06-18 NOTE — Telephone Encounter (Signed)
Received fax from [facility] that pt cannot afford copay for Remicade and her referral was cancelled. Have sent a note to [facility] to see what the coverage would be for the pt to get it at the hospital.  Otilio Connors,  Typically the hospital costs more for anything done there over a free standing facility. That's why I always recommend sending patients to the free standing facilities. Most insurances want that anyway now, and it ends up being cheaper for the patient over all. I've called and left a msg for Pres[facility] @ [facility] to see what the cost typically is there and if they have any patient assistance.  I'll let you know when I hear back from her.  Thanks  Amy

## 2020-06-18 NOTE — Telephone Encounter (Signed)
See below from Amy, I would think [facility] would be the same.

## 2020-06-18 NOTE — Telephone Encounter (Signed)
I spoke with Kathy [name] at [facility] She is going to reach out to [employer] rep to see if there is any co-pay assistance or a medical foundation that may allow her to get bio similar infliximab/Inflectra.  She has never been able to get Remicade co-pay assistance and her experience with Medicare advantage patients [name] may be the next best option with help from the drug company  Infliximab is felt to be the best medication for this patient with severe Crohn's disease, perianal involvement and history of fistula Entyvio would be next best option  Kathy [name] will be in touch with me or [name] when she hears back from [employer] rep  In the interim the patient should continue Humira every 5 days as before  [employer]

## 2020-06-18 NOTE — Telephone Encounter (Signed)
[place], Amy Roselee Nova, RN [place], [place] called me back.  She said unfortunately, with her plan [employer] advantage, the patient would have a 20% co-insurance (few hundred dollars), and there is no patient assistance with those with Medicare plans.  If you want to talk about other options they may have, you can call her at [phone].  She's very helpful with questions.  Thanks,  Amy

## 2020-06-18 NOTE — Telephone Encounter (Signed)
Spoke with pt and she is aware. Referral faxed to [facility] attn Colletta [place].

## 2020-06-18 NOTE — Telephone Encounter (Signed)
Kathy [name] from [facility] call me back [facility] does have a free drug program The patient will need to apply and [facility] will help set this process up.  Hopefully this can happen quickly.  [name], please send over a new patient referral form for [facility] and they will call the patient to get this scheduled  She should continue Humira and other medications until [facility] can begin

## 2020-06-18 NOTE — Telephone Encounter (Signed)
What about [facility] for infliximab?

## 2020-06-20 ENCOUNTER — Telehealth: Payer: Self-pay

## 2020-06-20 NOTE — Telephone Encounter (Signed)
Received phone call from [employer] at [facility] that they have not been able to reach pt to get her scheduled for Remicade. Called pt and left message for her to call back.  Pt needs to call [phone] ext [phone] for [place] or 100 for [place].

## 2020-06-23 ENCOUNTER — Other Ambulatory Visit: Payer: Self-pay | Admitting: Internal Medicine

## 2020-06-23 NOTE — Telephone Encounter (Signed)
Spoke with pt and let her know to call the numbers below to set up appt with [facility] for Remicade. Pt wanted to know if she was going to see Dr. Hilarie Fredrickson prior to starting the new medicine. Discussed with her that there was not an appt scheduled. She states she does not want to start the Remicade until she can discuss this further with Dr. Hilarie Fredrickson. Let her know she would discuss this with the doctor she sees at [facility] but she wants to discuss it with Dr. Hilarie Fredrickson. Asked her if a phone call would be ok with her and she states it would. Dr. Hilarie Fredrickson notified.

## 2020-06-26 NOTE — Telephone Encounter (Signed)
Pt scheduled to see Dr. Hilarie Fredrickson 08/20/20@10 :10am. Appt letter mailed to pt.

## 2020-06-26 NOTE — Telephone Encounter (Signed)
I was able to speak to the patient by phone.  She wanted to speak to me again because she did not remember our complete discussion after her recent colonoscopy. Colonoscopy showed severely active Crohn's disease in the distal colon, rectum and perianal area I feel that infliximab is the correct therapy for her and she will be discontinuing Humira I would like her to continue azathioprine at current dose  She will be set up at [facility] for infusion with what I believe is bio similar infliximab using assistance from [place]  She will call their office today and proceed with induction  Time provided for questions and answers, she thanked me for the call  Please get her a follow-up with me in 6 to 8 weeks

## 2020-06-30 ENCOUNTER — Other Ambulatory Visit: Payer: Self-pay | Admitting: Internal Medicine

## 2020-07-03 DIAGNOSIS — Z79899 Other long term (current) drug therapy: Secondary | ICD-10-CM | POA: Diagnosis not present

## 2020-07-03 DIAGNOSIS — K5090 Crohn's disease, unspecified, without complications: Secondary | ICD-10-CM | POA: Diagnosis not present

## 2020-07-21 ENCOUNTER — Other Ambulatory Visit: Payer: Self-pay | Admitting: Internal Medicine

## 2020-07-31 DIAGNOSIS — K5090 Crohn's disease, unspecified, without complications: Secondary | ICD-10-CM | POA: Diagnosis not present

## 2020-08-18 ENCOUNTER — Telehealth: Payer: Self-pay | Admitting: Internal Medicine

## 2020-08-18 NOTE — Telephone Encounter (Signed)
Orders faxed as requested.

## 2020-08-18 NOTE — Telephone Encounter (Signed)
Pls call Wynona Neat with [facility], they need clarification on order for [facility], she stated that they need the frequency of infusion. Pls call her or fax another order with this information at [phone].

## 2020-08-20 ENCOUNTER — Encounter: Payer: Self-pay | Admitting: Internal Medicine

## 2020-08-20 ENCOUNTER — Ambulatory Visit: Payer: PPO | Admitting: Internal Medicine

## 2020-08-20 VITALS — BP 130/70 | HR 67 | Ht 59.0 in | Wt 205.0 lb

## 2020-08-20 DIAGNOSIS — K50111 Crohn's disease of large intestine with rectal bleeding: Secondary | ICD-10-CM

## 2020-08-20 DIAGNOSIS — K50119 Crohn's disease of large intestine with unspecified complications: Secondary | ICD-10-CM | POA: Diagnosis not present

## 2020-08-20 DIAGNOSIS — K219 Gastro-esophageal reflux disease without esophagitis: Secondary | ICD-10-CM

## 2020-08-20 NOTE — Progress Notes (Signed)
   Subjective:    Patient ID: Kathy [name], female    DOB: Oct 24, 1955, 65 y.o.   MRN: [id number]  HPI Kathy [name] is a 65 year old female with a history of Crohn's of the colon and anorectum with prior perianal fistula, hypertension, hyperlipidemia diabetes and asthma who is here for follow-up.  She was last seen in January at the time of her colonoscopy.  She was previously on Humira and azathioprine but Humira was deemed to be ineffective after her colonoscopy.  She is here alone today.  Colonoscopy on 05/22/2020 showed anal stenosis with linear ulceration at the intergluteal cleft.  There was moderate colitis in the sigmoid and severe colitis and proctitis in the distal sigmoid rectosigmoid and rectum.  She has transition from Humira to infliximab (bio similar [facility]) which she has had 1 dose.  Her initial dose was on 07/31/2020.  Her next dose is tomorrow.  She reports she is feeling okay.  She is still having some lower abdominal pain at times as well as blood with her bowel movements.  Stools can be mostly ribbonlike though at other times can be normal.  She is not needing antidiarrheals or MiraLAX.  She still has gas and bloating.   Review of Systems As per HPI, otherwise negative  Current Medications, Allergies, Past Medical History, Past Surgical History, Family History and Social History were reviewed in [employer] record.     Objective:   Physical Exam BP 130/70   Pulse 67   Ht 4' 11"  (1.499 m)   Wt 205 lb (93 kg)   SpO2 97%   BMI 41.40 kg/m  Gen: awake, alert, NAD HEENT: anicteric CV: RRR, no mrg Pulm: CTA b/l Abd: soft, NT/ND, +BS throughout Ext: no c/c/e Neuro: nonfocal      Assessment & Plan:  65 year old female with a history of Crohn's of the colon and anorectum with prior perianal fistula, hypertension, hyperlipidemia diabetes and asthma who is here for follow-up.    1.  Crohn's colitis with perianal involvement and  history of fistula --she had moderate to severe disease by colonoscopy in January despite Humira every 5 days and azathioprine.  She has been transitioned to infliximab which I am hopeful will be more effective for her.  We have discussed this at length today. -- Continue infliximab infusions at [facility] with second induction infusion tomorrow; she will have her third dose in about 4 weeks and then plan 5 mg/kg every 8 weeks. -- Before her fourth dose I would like to check an infliximab level; we will assess response and increase to 10 mg/kg if needed -- CBC and CMP recommended to follow-up -- She can use MiraLAX if needed -- Return to see me in 8 to 10 weeks  2.  GERD with esophagitis --continue omeprazole 40 mg daily  20 minutes total spent today including patient facing time, coordination of care, reviewing medical history/procedures/pertinent radiology studies, and documentation of the encounter.

## 2020-08-20 NOTE — Patient Instructions (Signed)
You are scheduled for follow up with Dr Hilarie Fredrickson on 10/23/20 at 10:30 am.  Get outside and enjoy the nice weather!  Keep on keepin' on! There is light at the end of the tunnel!  If you are age 67 or older, your body mass index should be between 23-30. Your Body mass index is 41.4 kg/m. If this is out of the aforementioned range listed, please consider follow up with your Primary Care Provider.  Due to recent changes in healthcare laws, you may see the results of your imaging and laboratory studies on MyChart before your provider has had a chance to review them.  We understand that in some cases there may be results that are confusing or concerning to you. Not all laboratory results come back in the same time frame and the provider may be waiting for multiple results in order to interpret others.  Please give [place] 48 hours in order for your provider to thoroughly review all the results before contacting the office for clarification of your results.

## 2020-08-21 DIAGNOSIS — K5090 Crohn's disease, unspecified, without complications: Secondary | ICD-10-CM | POA: Diagnosis not present

## 2020-08-25 ENCOUNTER — Other Ambulatory Visit: Payer: Self-pay | Admitting: Family

## 2020-08-25 DIAGNOSIS — I119 Hypertensive heart disease without heart failure: Secondary | ICD-10-CM

## 2020-09-18 DIAGNOSIS — K5090 Crohn's disease, unspecified, without complications: Secondary | ICD-10-CM | POA: Diagnosis not present

## 2020-09-28 ENCOUNTER — Other Ambulatory Visit: Payer: Self-pay | Admitting: Family

## 2020-09-28 ENCOUNTER — Other Ambulatory Visit: Payer: Self-pay | Admitting: Internal Medicine

## 2020-09-28 DIAGNOSIS — Z8679 Personal history of other diseases of the circulatory system: Secondary | ICD-10-CM

## 2020-10-03 ENCOUNTER — Ambulatory Visit (INDEPENDENT_AMBULATORY_CARE_PROVIDER_SITE_OTHER): Payer: PPO | Admitting: Family

## 2020-10-03 ENCOUNTER — Other Ambulatory Visit: Payer: Self-pay

## 2020-10-03 ENCOUNTER — Telehealth: Payer: Self-pay | Admitting: Family

## 2020-10-03 VITALS — BP 140/63 | HR 71 | Temp 98.5°F | Resp 16 | Wt 208.0 lb

## 2020-10-03 DIAGNOSIS — E785 Hyperlipidemia, unspecified: Secondary | ICD-10-CM

## 2020-10-03 DIAGNOSIS — B353 Tinea pedis: Secondary | ICD-10-CM | POA: Diagnosis not present

## 2020-10-03 DIAGNOSIS — E119 Type 2 diabetes mellitus without complications: Secondary | ICD-10-CM | POA: Diagnosis not present

## 2020-10-03 DIAGNOSIS — Z0000 Encounter for general adult medical examination without abnormal findings: Secondary | ICD-10-CM

## 2020-10-03 DIAGNOSIS — I10 Essential (primary) hypertension: Secondary | ICD-10-CM | POA: Diagnosis not present

## 2020-10-03 LAB — COMPREHENSIVE METABOLIC PANEL
ALT: 7 U/L (ref 0–35)
AST: 12 U/L (ref 0–37)
Albumin: 3.8 g/dL (ref 3.5–5.2)
Alkaline Phosphatase: 108 U/L (ref 39–117)
BUN: 19 mg/dL (ref 6–23)
CO2: 30 mEq/L (ref 19–32)
Calcium: 9.6 mg/dL (ref 8.4–10.5)
Chloride: 98 mEq/L (ref 96–112)
Creatinine, Ser: 1.35 mg/dL — ABNORMAL HIGH (ref 0.40–1.20)
GFR: 40.76 mL/min — ABNORMAL LOW (ref >60.00)
Glucose, Bld: 176 mg/dL — ABNORMAL HIGH (ref 70–99)
Potassium: 4.1 mEq/L (ref 3.5–5.1)
Sodium: 136 mEq/L (ref 135–145)
Total Bilirubin: 0.3 mg/dL (ref 0.2–1.2)
Total Protein: 8.4 g/dL — ABNORMAL HIGH (ref 6.0–8.3)

## 2020-10-03 LAB — LIPID PANEL
Cholesterol: 121 mg/dL (ref 0–200)
HDL: 35.6 mg/dL — ABNORMAL LOW (ref >39.00)
LDL Cholesterol: 64 mg/dL (ref 0–99)
NonHDL: 85.31
Total CHOL/HDL Ratio: 3
Triglycerides: 106 mg/dL (ref 0.0–149.0)
VLDL: 21.2 mg/dL (ref 0.0–40.0)

## 2020-10-03 LAB — HEMOGLOBIN A1C: Hgb A1c MFr Bld: 7.4 % — ABNORMAL HIGH (ref 4.6–6.5)

## 2020-10-03 MED ORDER — DEXCOM G4 PLAT PED RCV/SHARE DEVI: 0 refills | Status: DC

## 2020-10-03 NOTE — Progress Notes (Signed)
Subjective:   By signing my name below, I, Kathy [name], attest that this documentation has been prepared under the direction and in the presence of [name] NP. 10/03/2020    Patient ID: Kathy [name], female    DOB: [birth year], 65 y.o.   MRN: [id number]  Chief Complaint  Patient presents with   Diabetes    Here for follow up    HPI Patient is in today for a follow up visit. She is UTD on vision care and last had an appointment at [facility]. eye care.  Hypertension- She continues taking 12.5 mg hydrochlorothiazide daily PO, 50 mg metoprolol succinate daily PO, and 10 mg amlodipine daily PO. She is managing her blood pressure well at this time. BP Readings from Last 3 Encounters:  10/03/20 140/63  08/20/20 130/70  05/22/20 (!) 123/52   Hyperlipidemia- She continues taking 10 mg simvastatin daily PO. She denies any muscle pain while on 10 mg simvastatin PO. Lab Results  Component Value Date   CHOL 130 10/24/2019   HDL 38.80 (L) 10/24/2019   LDLCALC 52 10/24/2019   TRIG 192.0 (H) 10/24/2019   CHOLHDL 3 10/24/2019    Blood sugar- Her a1c labs have some improvement. She does not measure her blood sugar as frequently as she wants. She experiences episodes of low blood sugar when going through long periods of not eating. She is requesting to use Dexcom.  Lab Results  Component Value Date   HGBA1C 6.5 (H) 02/04/2020    Diet- She has decreased appetite due to her medications. She often times has consciously remind herself to eat and take her medications. Bone Density- She has had a bone density scan in the past 2-3 years with ger GYN at [facility]. Immunization- She does not have her shingles vaccine at this time. She has a history of shingles. She is not interested in getting the shingles vaccine at this time. She has 3 Covid-19 vaccines and is eligible to get the 4th vaccine. Mammogram- She last had a mammogram in 2021. She had a bad experience during her last  mammogram and has anxiety about getting her next appointment.   Health Maintenance Due  Topic Date Due   OPHTHALMOLOGY EXAM  Never done   DEXA SCAN  Never done   URINE MICROALBUMIN  10/11/2019   COVID-19 Vaccine (4 - Booster for Pfizer series) 05/24/2020   HEMOGLOBIN A1C  08/04/2020    Past Medical History:  Diagnosis Date   Asthma    09-23-2017  per pt has not done inhaler since May 2018, no insurance (QVAR bid and Ventolin prn)   Crohn disease ([place])    Diastolic dysfunction    per echo 07-37-1062  grade 2 diastolic dysfunction , ef 60-65%   Esophagitis    GERD (gastroesophageal reflux disease)    Hemorrhoids    Hiatal hernia    Hyperlipidemia    stopped meds due to side effects   Hypertension    09-23-2017  per pt has takes bp meds since May 2018 no insurance (losartan 1106m qd, HCTZ 12.593mqd, and toprol 50 qd)   [place] 2 [place]   09-23-2017 per pt has not taken diabetic meds since May 2018, no insurance (kombiglyze [id number])    Past Surgical History:  Procedure Laterality Date   BREAST BIOPSY Bilateral    CARDIOVASCULAR STRESS TEST  07/29/2009   normal nuclear study w/ no ischemia/  normal LV function and wall motion,  ef 70%  CESAREAN SECTION  1980s   RECTAL BIOPSY N/A 10/03/2017   Procedure: POSSIBLE BIOPSY RECTAL;  Surgeon: Ileana Roup, MD;  Location: [facility];  Service: General;  Laterality: N/A;    Family History  Problem Relation Age of Onset   Hypertension Mother 40   Stroke Mother 64   Diabetes Maternal Aunt        s/p bilater amputation due to DM   Asthma Father    Colon cancer Neg Hx    Esophageal cancer Neg Hx    Stomach cancer Neg Hx    Rectal cancer Neg Hx     Social History   Socioeconomic History   Marital status: Married    Spouse name: Not on file   Number of children: 2   Years of education: Not on file   Highest education level: Not on file  Occupational History   Occupation: unemployed  Tobacco Use    Smoking status: Never   Smokeless tobacco: Never  Vaping Use   Vaping Use: Never used  Substance and Sexual Activity   Alcohol use: No   Drug use: No   Sexual activity: Not Currently  Other Topics Concern   Not on file  Social History Narrative   Not working currently. Retired in 2019. Previously worked in [employer] job   Married    2 children (son and daughter). They are both local   She has 1 grand-daughter and 1 grand-son   No pets (allergic)   Completed 4 years of college.   Social Determinants of Health   Financial Resource Strain: Not on file  Food Insecurity: Not on file  Transportation Needs: Not on file  Physical Activity: Not on file  Stress: Not on file  Social Connections: Not on file  Intimate Partner Violence: Not on file    Outpatient Medications Prior to Visit  Medication Sig Dispense Refill   albuterol (VENTOLIN HFA) 108 (90 Base) MCG/ACT inhaler Inhale 1 puff into the lungs every 6 (six) hours as needed for wheezing or shortness of breath. 18 g 5   amLODipine (NORVASC) 10 MG tablet Take 1 tablet by mouth once daily 90 tablet 0   azaTHIOprine (IMURAN) 50 MG tablet Take 2 tablets by mouth once daily 60 tablet 0   clotrimazole-betamethasone (LOTRISONE) cream Apply 1 application topically 2 (two) times daily. 30 g 1   colchicine 0.6 MG tablet Take 2 tabs by mouth now and 1 tab in 1 hour. 6 tablet 1   dicyclomine (BENTYL) 10 MG capsule TAKE 1 CAPSULE BY MOUTH THREE TIMES DAILY AS NEEDED FOR  CRAMPING 90 capsule 2   diphenoxylate-atropine (LOMOTIL) 2.5-0.025 MG tablet TAKE 1 TABLET BY MOUTH 4 TIMES DAILY AS NEEDED FOR DIARRHEA OR LOOSE STOOLS 120 tablet 1   glipiZIDE (GLUCOTROL XL) 10 MG 24 hr tablet Take 1 tablet (10 mg total) by mouth daily with breakfast. 90 tablet 1   hydrochlorothiazide (MICROZIDE) 12.5 MG capsule TAKE 1 CAPSULE BY MOUTH ONCE DAILY FOR BLOOD PRESSURE 90 capsule 0   hydrocortisone (ANUSOL-HC) 25 MG suppository Place 1 suppository (25 mg  total) rectally at bedtime. 14 suppository 0   JANUVIA 50 MG tablet Take 1 tablet by mouth once daily 90 tablet 0   metoCLOPramide (REGLAN) 10 MG tablet Take 1 tablet (10 mg total) by mouth as directed. 2 tablet 0   metoprolol succinate (TOPROL-XL) 50 MG 24 hr tablet Take 1 tablet (50 mg total) by mouth daily. 90 tablet 1   Multiple  Vitamins-Minerals (MULTIVITAMIN WITH MINERALS) tablet Take 1 tablet by mouth daily.     omeprazole (PRILOSEC) 40 MG capsule Take 1 capsule (40 mg total) by mouth daily. 90 capsule 1   polyethylene glycol (MIRALAX / GLYCOLAX) 17 g packet Take 17 g by mouth as needed. 1 capful prn 30 each 11   simvastatin (ZOCOR) 10 MG tablet Take 1 tablet (10 mg total) by mouth daily. 90 tablet 3   HUMIRA PEN 40 MG/0.4ML PNKT INJECT 1 PEN UNDER THE SKIN EVERY 5 DAYS. 6 each 11   No facility-administered medications prior to visit.    Allergies  Allergen Reactions   Lipitor [Atorvastatin Calcium]     Leg cramps    Lisinopril Itching   Pravachol [Pravastatin Sodium]    Pravastatin Sodium Other (See Comments)    leg cramps    ROS    See HPI Objective:    Physical Exam Constitutional:      General: She is not in acute distress.    Appearance: Normal appearance. She is not ill-appearing.  HENT:     Head: Normocephalic and atraumatic.     Right Ear: External ear normal.     Left Ear: External ear normal.  Eyes:     Extraocular Movements: Extraocular movements intact.     Pupils: Pupils are equal, round, and reactive to light.  Cardiovascular:     Rate and Rhythm: Normal rate and regular rhythm.     Pulses: Normal pulses.     Heart sounds: Normal heart sounds. No murmur heard.   No gallop.  Pulmonary:     Effort: Pulmonary effort is normal. No respiratory distress.     Breath sounds: Normal breath sounds. No wheezing, rhonchi or rales.  Feet:     Comments:   Skin:    General: Skin is warm and dry.  Neurological:     Mental Status: She is alert and oriented to  person, place, and time.  Psychiatric:        Behavior: Behavior normal.    BP 140/63 (BP Location: Right Arm, Patient Position: Sitting, Cuff Size: Large)   Pulse 71   Temp 98.5 F (36.9 C) (Oral)   Resp 16   Wt 208 lb (94.3 kg)   SpO2 100%   BMI 42.01 kg/m  Wt Readings from Last 3 Encounters:  10/03/20 208 lb (94.3 kg)  08/20/20 205 lb (93 kg)  05/22/20 208 lb (94.3 kg)       Assessment & Plan:   Problem List Items Addressed This Visit       Unprioritized   Hyperlipidemia - Primary (Chronic)    Lab Results  Component Value Date   CHOL 130 10/24/2019   HDL 38.80 (L) 10/24/2019   LDLCALC 52 10/24/2019   TRIG 192.0 (H) 10/24/2019   CHOLHDL 3 10/24/2019  Tolerating simvastatin 48m once daily Check lipid panel.        Relevant Orders   Lipid panel   Essential hypertension (Chronic)    BP Readings from Last 3 Encounters:  10/03/20 140/63  08/20/20 130/70  05/22/20 (!) 123/52  Stable on amlodipine 154m toprol xl 5028mnd hctz 12.5.        Relevant Orders   Comp Met (CMET)   Tinea pedis of both feet    New. Recommended otc lamisil spray.        Controlled type 2 diabetes mellitus without complication (HCC)    Clinically stable on januvia 63m42md glucotrol 10mg19meck A1C. She  is requesting rx for Dexcom continuous glucose monitor. Rx sent.        Relevant Orders   Hemoglobin A1c   Other Visit Diagnoses     Preventative health care       Relevant Orders   MM 3D SCREEN BREAST BILATERAL        Meds ordered this encounter  Medications   Continuous Blood Gluc Receiver ([facility] RCV/SHARE) DEVI    Sig: Use as directed    Dispense:  1 each    Refill:  0    Order Specific Question:   Supervising Provider    Answer:   Penni Homans A [[id number]]    I, [name] NP, personally preformed the services described in this documentation.  All medical record entries made by the scribe were at my direction and in my presence.  I have  reviewed the chart and discharge instructions (if applicable) and agree that the record reflects my personal performance and is accurate and complete. 10/03/2020   I,Kathy [name],acting as a Education administrator for Nance Pear, NP.,have documented all relevant documentation on the behalf of Nance Pear, NP,as directed by  Nance Pear, NP while in the presence of Nance Pear, NP.   Nance Pear, NP

## 2020-10-03 NOTE — Telephone Encounter (Signed)
Patient advised of rx and provider's advised to call about coverage and to charge q 10 days.  Records request will be sent to [facility]

## 2020-10-03 NOTE — Assessment & Plan Note (Signed)
New. Recommended otc lamisil spray.

## 2020-10-03 NOTE — Assessment & Plan Note (Addendum)
Clinically stable on januvia 49m and glucotrol 181m Check A1C. She is requesting rx for Dexcom continuous glucose monitor. Rx sent.

## 2020-10-03 NOTE — Assessment & Plan Note (Signed)
BP Readings from Last 3 Encounters:  10/03/20 140/63  08/20/20 130/70  05/22/20 (!) 123/52   Stable on amlodipine 54m, toprol xl 540mand hctz 12.5.

## 2020-10-03 NOTE — Assessment & Plan Note (Signed)
Lab Results  Component Value Date   CHOL 130 10/24/2019   HDL 38.80 (L) 10/24/2019   LDLCALC 52 10/24/2019   TRIG 192.0 (H) 10/24/2019   CHOLHDL 3 10/24/2019   Tolerating simvastatin 48m once daily Check lipid panel.

## 2020-10-03 NOTE — Telephone Encounter (Signed)
Please call [facility] in [facility] and request DM eye exam.

## 2020-10-03 NOTE — Telephone Encounter (Signed)
Also, call [facility] and request bone density.

## 2020-10-04 ENCOUNTER — Telehealth: Payer: Self-pay | Admitting: Family

## 2020-10-04 NOTE — Telephone Encounter (Signed)
A1C is up to 7.4.  Please work hard on diabetic diet, exercise, weight loss. If it remains elevated next visit, we may need to add another medication for sugar.

## 2020-10-06 NOTE — Telephone Encounter (Signed)
Patient advised of results and provider's advise. She verbalized understanding.

## 2020-10-07 ENCOUNTER — Ambulatory Visit (HOSPITAL_BASED_OUTPATIENT_CLINIC_OR_DEPARTMENT_OTHER)
Admission: RE | Admit: 2020-10-07 | Discharge: 2020-10-07 | Disposition: A | Discharge status: Home / Self Care | Payer: PPO | Source: Ambulatory Visit | Attending: Family | Admitting: Family

## 2020-10-07 ENCOUNTER — Encounter (HOSPITAL_BASED_OUTPATIENT_CLINIC_OR_DEPARTMENT_OTHER): Payer: Self-pay

## 2020-10-07 ENCOUNTER — Other Ambulatory Visit: Payer: Self-pay

## 2020-10-07 DIAGNOSIS — Z0000 Encounter for general adult medical examination without abnormal findings: Secondary | ICD-10-CM

## 2020-10-07 DIAGNOSIS — Z1231 Encounter for screening mammogram for malignant neoplasm of breast: Secondary | ICD-10-CM | POA: Insufficient documentation

## 2020-10-10 ENCOUNTER — Telehealth: Payer: Self-pay | Admitting: Family

## 2020-10-10 ENCOUNTER — Other Ambulatory Visit: Payer: Self-pay | Admitting: Family

## 2020-10-10 DIAGNOSIS — R928 Other abnormal and inconclusive findings on diagnostic imaging of breast: Secondary | ICD-10-CM

## 2020-10-10 NOTE — Telephone Encounter (Signed)
Please let pt know that the radiologist would like her to complete some additional breast images for further evaluation. Let me know if she has not been contacted by them about a follow up appointment in 1 week.

## 2020-10-15 NOTE — Telephone Encounter (Signed)
Patient advised she should receive a call from radiology to set up appointment.

## 2020-10-16 DIAGNOSIS — K5090 Crohn's disease, unspecified, without complications: Secondary | ICD-10-CM | POA: Diagnosis not present

## 2020-10-20 ENCOUNTER — Other Ambulatory Visit: Payer: Self-pay | Admitting: Family

## 2020-10-20 DIAGNOSIS — E119 Type 2 diabetes mellitus without complications: Secondary | ICD-10-CM

## 2020-10-23 ENCOUNTER — Other Ambulatory Visit: Payer: Self-pay | Admitting: Internal Medicine

## 2020-10-23 ENCOUNTER — Encounter: Payer: Self-pay | Admitting: Internal Medicine

## 2020-10-23 ENCOUNTER — Other Ambulatory Visit (INDEPENDENT_AMBULATORY_CARE_PROVIDER_SITE_OTHER): Payer: PPO

## 2020-10-23 ENCOUNTER — Ambulatory Visit: Payer: PPO | Admitting: Internal Medicine

## 2020-10-23 VITALS — BP 120/80 | HR 67 | Ht <= 58 in | Wt 210.0 lb

## 2020-10-23 DIAGNOSIS — K219 Gastro-esophageal reflux disease without esophagitis: Secondary | ICD-10-CM | POA: Diagnosis not present

## 2020-10-23 DIAGNOSIS — K50111 Crohn's disease of large intestine with rectal bleeding: Secondary | ICD-10-CM | POA: Diagnosis not present

## 2020-10-23 DIAGNOSIS — K50119 Crohn's disease of large intestine with unspecified complications: Secondary | ICD-10-CM

## 2020-10-23 DIAGNOSIS — K2100 Gastro-esophageal reflux disease with esophagitis, without bleeding: Secondary | ICD-10-CM | POA: Diagnosis not present

## 2020-10-23 DIAGNOSIS — E538 Deficiency of other specified B group vitamins: Secondary | ICD-10-CM

## 2020-10-23 LAB — CBC WITH DIFFERENTIAL/PLATELET
Basophils Absolute: 0 10*3/uL (ref 0.0–0.1)
Basophils Relative: 0.5 % (ref 0.0–3.0)
Eosinophils Absolute: 0.1 10*3/uL (ref 0.0–0.7)
Eosinophils Relative: 1.8 % (ref 0.0–5.0)
HCT: 35.9 % — ABNORMAL LOW (ref 36.0–46.0)
Hemoglobin: 11.3 g/dL — ABNORMAL LOW (ref 12.0–15.0)
Lymphocytes Relative: 14.6 % (ref 12.0–46.0)
Lymphs Abs: 0.8 10*3/uL (ref 0.7–4.0)
MCHC: 31.6 g/dL (ref 30.0–36.0)
MCV: 69.7 fl — ABNORMAL LOW (ref 78.0–100.0)
Monocytes Absolute: 0.6 10*3/uL (ref 0.1–1.0)
Monocytes Relative: 10.5 % (ref 3.0–12.0)
Neutro Abs: 4 10*3/uL (ref 1.4–7.7)
Neutrophils Relative %: 72.6 % (ref 43.0–77.0)
Platelets: 337 10*3/uL (ref 150.0–400.0)
RBC: 5.15 Mil/uL — ABNORMAL HIGH (ref 3.87–5.11)
RDW: 17.2 % — ABNORMAL HIGH (ref 11.5–15.5)
WBC: 5.5 10*3/uL (ref 4.0–10.5)

## 2020-10-23 LAB — IBC + FERRITIN
Ferritin: 24.4 ng/mL (ref 10.0–291.0)
Iron: 51 ug/dL (ref 42–145)
Saturation Ratios: 13.4 % — ABNORMAL LOW (ref 20.0–50.0)
Transferrin: 271 mg/dL (ref 212.0–360.0)

## 2020-10-23 LAB — VITAMIN B12: Vitamin B-12: 120 pg/mL — ABNORMAL LOW (ref 211–911)

## 2020-10-23 MED ORDER — DICYCLOMINE HCL 10 MG PO CAPS: ORAL_CAPSULE | ORAL | 2 refills | Status: DC

## 2020-10-23 MED ORDER — CYANOCOBALAMIN 1000 MCG/ML IJ SOLN: 1000.0000 ug | INTRAMUSCULAR | 0 refills | Status: DC

## 2020-10-23 MED ORDER — CYANOCOBALAMIN 1000 MCG/ML IJ SOLN
1000.0000 ug | INTRAMUSCULAR | Status: AC
  Administered 2020-10-28 – 2020-12-30 (×3): 1000 ug via INTRAMUSCULAR

## 2020-10-23 MED ORDER — VITAMIN B-12 1000 MCG PO TABS: 1000.0000 ug | ORAL_TABLET | Freq: Every day | ORAL | 0 refills | Status: DC

## 2020-10-23 NOTE — Patient Instructions (Addendum)
You have been scheduled for a follow up with Dr Hilarie Fredrickson on 02/19/21 at 2:30 pm.  Continue Dexilant.  We have sent the following medications to your pharmacy for you to pick up at your convenience: Bentyl (dicyclomine)  Your provider has requested that you go to the basement level for lab work before leaving today. Press "B" on the elevator. The lab is located at the first door on the left as you exit the elevator.  Your provider has requested that you go to the basement level for lab work on Tuesday, 12/09/20. Press "B" on the elevator. The lab is located at the first door on the left as you exit the elevator.  If you are age 67 or older, your body mass index should be between 23-30. Your Body mass index is 43.89 kg/m. If this is out of the aforementioned range listed, please consider follow up with your Primary Care Provider. __________________________________________________________  The [facility] providers would like to encourage you to use [facility] to communicate with providers for non-urgent requests or questions.  Due to long hold times on the telephone, sending your provider a message by [facility] may be a faster and more efficient way to get a response.  Please allow 48 business hours for a response.  Please remember that this is for non-urgent requests.   Due to recent changes in healthcare laws, you may see the results of your imaging and laboratory studies on MyChart before your provider has had a chance to review them.  We understand that in some cases there may be results that are confusing or concerning to you. Not all laboratory results come back in the same time frame and the provider may be waiting for multiple results in order to interpret others.  Please give [place] 48 hours in order for your provider to thoroughly review all the results before contacting the office for clarification of your results.

## 2020-10-23 NOTE — Progress Notes (Signed)
Subjective:    Patient ID: Kathy [name], female    DOB: 09-04-1955, 65 y.o.   MRN: [id number]  HPI Kathy [name] is a 65 year old female with a history of Crohn's disease of the colon and anorectum with prior fistula now on infliximab (started April 2022), HTN, DM2, HL, and asthma who is here for follow-up.  She is here alone today and was last seen on 08/20/2020.  Infliximab was started on 07/31/2020 and she received dose #2 on 08/21/2020, dose #3 on 09/18/2020 and dose #4 on 10/16/2020.  She is scheduled for her next maintenance infusion dose on 12/11/2020.  She continues azathioprine 100 mg daily.  She is using Bentyl 10 mg 2-3 times a day.  She has not needed Lomotil or MiraLAX.  She does continue omeprazole 40 mg daily.  Energy level still not returned to normal and she does not feel that her stamina is back to her baseline.  She is having bowel movements typically 1 to 3/day which are ribbonlike.  Usually she will see a streak of blood with the stool.  Stools are now rarely loose.  She still has urgency when she feels bowel movement.  She is typically not having significant anorectal pain with passing stool.   Review of Systems As per HPI, otherwise negative  Current Medications, Allergies, Past Medical History, Past Surgical History, Family History and Social History were reviewed in [employer] record.    Objective:   Physical Exam BP 120/80   Pulse 67   Ht 4' 10"  (1.473 m)   Wt 210 lb (95.3 kg)   BMI 43.89 kg/m  Gen: awake, alert, NAD HEENT: anicteric, op clear CV: RRR, no mrg Pulm: CTA b/l Abd: soft, NT/ND, +BS throughout Rectal: Small hemorrhoids, light brown stool, mild tenderness and mild stenosis in the anorectal canal without mass, the ulceration extending cephalad in the intergluteal cleft is considerably better though not completely healed Ext: no c/c/e Neuro: nonfocal  CMP     Component Value Date/Time   NA 136 10/03/2020 1051    NA 139 10/11/2018 1009   K 4.1 10/03/2020 1051   CL 98 10/03/2020 1051   CO2 30 10/03/2020 1051   GLUCOSE 176 (H) 10/03/2020 1051   BUN 19 10/03/2020 1051   BUN 14 10/11/2018 1009   CREATININE 1.35 (H) 10/03/2020 1051   CREATININE 1.49 (H) 02/04/2020 1114   CALCIUM 9.6 10/03/2020 1051   PROT 8.4 (H) 10/03/2020 1051   PROT 7.4 10/11/2018 1009   ALBUMIN 3.8 10/03/2020 1051   ALBUMIN 3.5 (L) 10/11/2018 1009   AST 12 10/03/2020 1051   ALT 7 10/03/2020 1051   ALKPHOS 108 10/03/2020 1051   BILITOT 0.3 10/03/2020 1051   BILITOT 0.3 10/11/2018 1009   GFRNONAA 42 (L) 10/11/2018 1009   GFRAA 48 (L) 10/11/2018 1009        Assessment & Plan:  65 year old female with a history of Crohn's disease of the colon and anorectum with prior fistula now on infliximab (started April 2022), HTN, DM2, HL, and asthma who is here for follow-up.    Crohn's colitis with perianal involvement and history of fistula (previous Humira every 5 days, biosimilar infliximab initiation April 2022) --I feel that she is slowly improving with the initiation of infliximab in conjunction with her azathioprine.  She still sees rectal bleeding which is likely related to hemorrhoids but also activity of the Crohn's disease in the distal rectum and anorectum.  My  hope is that this will continue to improve with infliximab therapy. --Continue infliximab infusions at [facility] with fifth infusion scheduled for 12/11/2020; currently 5 mg/kg every 8 weeks --I have asked her to come to our lab on 12/09/2020 for an infliximab level at trough; we may need to increase dose to 10 mg/kg or consider shortening frequency depending on level and continued response --Recent CMP shows improving albumin -- Check CBC, iron levels and B12 --Continue Bentyl 10 mg 3 times daily as needed  2.  GERD with history of esophagitis --currently well controlled continue omeprazole 40 mg daily  30 minutes total spent today including patient facing  time, coordination of care, reviewing medical history/procedures/pertinent radiology studies, and documentation of the encounter.

## 2020-10-28 ENCOUNTER — Ambulatory Visit (INDEPENDENT_AMBULATORY_CARE_PROVIDER_SITE_OTHER): Payer: PPO | Admitting: Internal Medicine

## 2020-10-28 DIAGNOSIS — E538 Deficiency of other specified B group vitamins: Secondary | ICD-10-CM | POA: Diagnosis not present

## 2020-10-30 ENCOUNTER — Other Ambulatory Visit: Payer: Self-pay | Admitting: Internal Medicine

## 2020-10-30 NOTE — Progress Notes (Signed)
B12 injection with CMA

## 2020-11-18 ENCOUNTER — Other Ambulatory Visit: Payer: Self-pay | Admitting: Family

## 2020-11-18 DIAGNOSIS — I119 Hypertensive heart disease without heart failure: Secondary | ICD-10-CM

## 2020-11-18 DIAGNOSIS — I10 Essential (primary) hypertension: Secondary | ICD-10-CM

## 2020-11-28 ENCOUNTER — Other Ambulatory Visit: Payer: Self-pay

## 2020-11-28 ENCOUNTER — Ambulatory Visit (INDEPENDENT_AMBULATORY_CARE_PROVIDER_SITE_OTHER): Payer: PPO | Admitting: Internal Medicine

## 2020-11-28 DIAGNOSIS — E538 Deficiency of other specified B group vitamins: Secondary | ICD-10-CM | POA: Diagnosis not present

## 2020-12-01 ENCOUNTER — Other Ambulatory Visit: Payer: Self-pay | Admitting: Family

## 2020-12-11 ENCOUNTER — Other Ambulatory Visit: Payer: Self-pay | Admitting: Internal Medicine

## 2020-12-11 DIAGNOSIS — K5090 Crohn's disease, unspecified, without complications: Secondary | ICD-10-CM | POA: Diagnosis not present

## 2020-12-30 ENCOUNTER — Ambulatory Visit (INDEPENDENT_AMBULATORY_CARE_PROVIDER_SITE_OTHER): Payer: PPO | Admitting: Internal Medicine

## 2020-12-30 ENCOUNTER — Other Ambulatory Visit: Payer: Self-pay | Admitting: Family

## 2020-12-30 DIAGNOSIS — E538 Deficiency of other specified B group vitamins: Secondary | ICD-10-CM | POA: Diagnosis not present

## 2020-12-30 DIAGNOSIS — Z8679 Personal history of other diseases of the circulatory system: Secondary | ICD-10-CM

## 2021-01-05 ENCOUNTER — Encounter: Payer: Self-pay | Admitting: Family

## 2021-01-05 ENCOUNTER — Telehealth: Payer: Self-pay | Admitting: Family

## 2021-01-05 ENCOUNTER — Ambulatory Visit (INDEPENDENT_AMBULATORY_CARE_PROVIDER_SITE_OTHER): Payer: PPO | Admitting: Family

## 2021-01-05 ENCOUNTER — Other Ambulatory Visit: Payer: Self-pay

## 2021-01-05 VITALS — BP 140/57 | HR 66 | Temp 98.1°F | Resp 16 | Ht <= 58 in | Wt 213.0 lb

## 2021-01-05 DIAGNOSIS — E119 Type 2 diabetes mellitus without complications: Secondary | ICD-10-CM

## 2021-01-05 DIAGNOSIS — E7800 Pure hypercholesterolemia, unspecified: Secondary | ICD-10-CM

## 2021-01-05 DIAGNOSIS — I10 Essential (primary) hypertension: Secondary | ICD-10-CM

## 2021-01-05 DIAGNOSIS — Z23 Encounter for immunization: Secondary | ICD-10-CM

## 2021-01-05 DIAGNOSIS — K2100 Gastro-esophageal reflux disease with esophagitis, without bleeding: Secondary | ICD-10-CM

## 2021-01-05 DIAGNOSIS — E348 Other specified endocrine disorders: Secondary | ICD-10-CM | POA: Diagnosis not present

## 2021-01-05 DIAGNOSIS — E538 Deficiency of other specified B group vitamins: Secondary | ICD-10-CM

## 2021-01-05 HISTORY — DX: Deficiency of other specified B group vitamins: E53.8

## 2021-01-05 LAB — BASIC METABOLIC PANEL
BUN: 15 mg/dL (ref 6–23)
CO2: 31 mEq/L (ref 19–32)
Calcium: 9.5 mg/dL (ref 8.4–10.5)
Chloride: 97 mEq/L (ref 96–112)
Creatinine, Ser: 1.19 mg/dL (ref 0.40–1.20)
GFR: 47.33 mL/min — ABNORMAL LOW (ref >60.00)
Glucose, Bld: 198 mg/dL — ABNORMAL HIGH (ref 70–99)
Potassium: 3.6 mEq/L (ref 3.5–5.1)
Sodium: 137 mEq/L (ref 135–145)

## 2021-01-05 LAB — HEMOGLOBIN A1C: Hgb A1c MFr Bld: 7.4 % — ABNORMAL HIGH (ref 4.6–6.5)

## 2021-01-05 NOTE — Assessment & Plan Note (Signed)
Stable on omeprazole 38m once daily.

## 2021-01-05 NOTE — Progress Notes (Signed)
Subjective:   By signing my name below, I, [name], attest that this documentation has been prepared under the direction and in the presence of [name] NP. 01/05/2021    Patient ID: Kathy [name], female    DOB: 16-Dec-1955, 65 y.o.   MRN: [id number]  Chief Complaint  Patient presents with   Diabetes    Here for follow up    HPI Patient is in today for a office visit.  DM2- Maintained on januvia 44m, glucotrol xl 125m Reports sugars have been stable at home. No hypoglycemia.   Lab Results  Component Value Date   HGBA1C 7.4 (H) 10/03/2020   HGBA1C 6.5 (H) 02/04/2020   HGBA1C 8.3 (H) 10/24/2019   Lab Results  Component Value Date   MICROALBUR 6.9 (H) 04/28/2010   LDLCALC 64 10/03/2020   CREATININE 1.35 (H) 10/03/2020   HTN-  did not take AM bP meds.  Maintained on metoprolol 5054mnd amlodipine 42m101m BP Readings from Last 3 Encounters:  01/05/21 (!) 140/57  10/23/20 120/80  10/03/20 140/63   GERD- maintained on omeprazole 40mg19mports symptoms stable.   Hyperlipidemia- She is working on increasing her exercise.  Lab Results  Component Value Date   CHOL 121 10/03/2020   HDL 35.60 (L) 10/03/2020   LDLCALC 64 10/03/2020   TRIG 106.0 10/03/2020   CHOLHDL 3 10/03/2020   B12 deficiency- She is receiving b12 shots form her GI team.  Health Maintenance Due  Topic Date Due   OPHTHALMOLOGY EXAM  Never done   DEXA SCAN  Never done   URINE MICROALBUMIN  10/11/2019   COVID-19 Vaccine (4 - Booster for Pfizer series) 05/16/2020    Past Medical History:  Diagnosis Date   Asthma    09-23-2017  per pt has not done inhaler since May 2018, no insurance (QVAR bid and Ventolin prn)   Crohn disease (HCC) [place]    per echo [id number] 2 diastolic dysfunction , ef 60-65%   Esophagitis    GERD (gastroesophageal reflux disease)    Hemorrhoids    Hiatal hernia    Hyperlipidemia    stopped meds due to side effects    Hypertension    09-23-2017  per pt has takes bp meds since May 2018 no insurance (losartan 100mg 86mHCTZ 12.5mg qd75mnd toprol 50 qd)   Type 2 diabetes mellitus (HCC)   [place]-31-2019 per pt has not taken diabetic meds since May 2018, no insurance (kombiglyze 5-1000mg qd1m Past Surgical History:  Procedure Laterality Date   BREAST BIOPSY Bilateral    CARDIOVASCULAR STRESS TEST  07/29/2009   normal nuclear study w/ no ischemia/  normal LV function and wall motion,  ef 70%   CESAREAN SECTION  1980s   RECTAL BIOPSY N/A 10/03/2017   Procedure: POSSIBLE BIOPSY RECTAL;  Surgeon: White, CIleana Roupocation: [facility];  Service: General;  Laterality: N/A;    Family History  Problem Relation Age of Onset   Hypertension Mother 70   Str25e Mother 70   Dia55tes Maternal Aunt        s/p bilater amputation due to DM   Asthma Father    Colon cancer Neg Hx    Esophageal cancer Neg Hx    Stomach cancer Neg Hx    Rectal cancer Neg Hx     Social History   Socioeconomic History   Marital status: Married    Spouse name:  Not on file   Number of children: 2   Years of education: Not on file   Highest education level: Not on file  Occupational History   Occupation: unemployed  Tobacco Use   Smoking status: Never   Smokeless tobacco: Never  Vaping Use   Vaping Use: Never used  Substance and Sexual Activity   Alcohol use: No   Drug use: No   Sexual activity: Not Currently  Other Topics Concern   Not on file  Social History Narrative   Not working currently. Retired in 2019. Previously worked in [employer] job   Married    2 children (son and daughter). They are both local   She has 1 grand-daughter and 1 grand-son   No pets (allergic)   Completed 4 years of college.   Social Determinants of Health   Financial Resource Strain: Not on file  Food Insecurity: Not on file  Transportation Needs: Not on file  Physical Activity: Not on file  Stress: Not on file  Social  Connections: Not on file  Intimate Partner Violence: Not on file    Outpatient Medications Prior to Visit  Medication Sig Dispense Refill   albuterol (VENTOLIN HFA) 108 (90 Base) MCG/ACT inhaler Inhale 1 puff into the lungs every 6 (six) hours as needed for wheezing or shortness of breath. 18 g 5   amLODipine (NORVASC) 10 MG tablet Take 1 tablet by mouth once daily 90 tablet 0   azaTHIOprine (IMURAN) 50 MG tablet Take 2 tablets by mouth once daily 60 tablet 2   clotrimazole-betamethasone (LOTRISONE) cream Apply 1 application topically 2 (two) times daily. 30 g 1   colchicine 0.6 MG tablet Take 2 tabs by mouth now and 1 tab in 1 hour. 6 tablet 1   Continuous Blood Gluc Receiver ([place] G4 PLAT PED RCV/SHARE) DEVI Use as directed 1 each 0   cyanocobalamin (,VITAMIN B-12,) 1000 MCG/ML injection Inject 1 mL (1,000 mcg total) into the muscle every 30 (thirty) days. 3 mL 0   dicyclomine (BENTYL) 10 MG capsule TAKE 1 CAPSULE BY MOUTH THREE TIMES DAILY AS NEEDED FOR  CRAMPING 90 capsule 2   diphenoxylate-atropine (LOMOTIL) 2.5-0.025 MG tablet TAKE 1 TABLET BY MOUTH 4 TIMES DAILY AS NEEDED FOR DIARRHEA OR LOOSE STOOLS 120 tablet 1   glipiZIDE (GLUCOTROL XL) 10 MG 24 hr tablet Take 1 tablet by mouth once daily with breakfast 90 tablet 1   hydrochlorothiazide (MICROZIDE) 12.5 MG capsule TAKE 1 CAPSULE BY MOUTH ONCE DAILY FOR BLOOD PRESSURE 90 capsule 0   hydrocortisone (ANUSOL-HC) 25 MG suppository Place 1 suppository (25 mg total) rectally at bedtime. 14 suppository 0   JANUVIA 50 MG tablet Take 1 tablet by mouth once daily 90 tablet 0   metoprolol succinate (TOPROL-XL) 50 MG 24 hr tablet Take 1 tablet by mouth once daily 90 tablet 0   Multiple Vitamins-Minerals (MULTIVITAMIN WITH MINERALS) tablet Take 1 tablet by mouth daily.     omeprazole (PRILOSEC) 40 MG capsule Take 1 capsule by mouth once daily 90 capsule 0   polyethylene glycol (MIRALAX / GLYCOLAX) 17 g packet Take 17 g by mouth as needed. 1  capful prn 30 each 11   simvastatin (ZOCOR) 10 MG tablet Take 1 tablet (10 mg total) by mouth daily. 90 tablet 3   vitamin B-12 (CYANOCOBALAMIN) 1000 MCG tablet Take 1 tablet (1,000 mcg total) by mouth daily. 1 tablet 0   No facility-administered medications prior to visit.    Allergies  Allergen Reactions   Lipitor [Atorvastatin Calcium]     Leg cramps    Lisinopril Itching   Pravachol [Pravastatin Sodium]    Pravastatin Sodium Other (See Comments)    leg cramps    ROS See HPI    Objective:    Physical Exam Constitutional:      General: She is not in acute distress.    Appearance: Normal appearance. She is well-developed. She is not ill-appearing.  HENT:     Head: Normocephalic and atraumatic.     Right Ear: External ear normal.     Left Ear: External ear normal.  Eyes:     Extraocular Movements: Extraocular movements intact.     Pupils: Pupils are equal, round, and reactive to light.  Cardiovascular:     Rate and Rhythm: Normal rate and regular rhythm.     Heart sounds: Normal heart sounds. No murmur heard.   No gallop.  Pulmonary:     Effort: Pulmonary effort is normal. No respiratory distress.     Breath sounds: Normal breath sounds. No wheezing or rales.  Musculoskeletal:     Right lower leg: 2+ Edema present.     Left lower leg: 2+ Edema present.  Skin:    General: Skin is warm and dry.  Neurological:     Mental Status: She is alert and oriented to person, place, and time.  Psychiatric:        Behavior: Behavior normal.        Thought Content: Thought content normal.        Judgment: Judgment normal.    BP (!) 140/57 (BP Location: Right Arm, Patient Position: Sitting, Cuff Size: Large)   Pulse 66   Temp 98.1 F (36.7 C) (Oral)   Resp 16   Ht 4' 10"  (1.473 m)   Wt 213 lb (96.6 kg)   SpO2 100%   BMI 44.52 kg/m  Wt Readings from Last 3 Encounters:  01/05/21 213 lb (96.6 kg)  10/23/20 210 lb (95.3 kg)  10/03/20 208 lb (94.3 kg)       Assessment  & Plan:   Problem List Items Addressed This Visit       Unprioritized   Hyperlipidemia (Chronic)    LDL at goal. Continue simvastatin 47m.       Essential hypertension (Chronic)    Blood pressure is mildly elevated, but pt missed AM meds. Continue Maintained on metoprolol 539mand amlodipine 1035m       GERD (gastroesophageal reflux disease)    Stable on omeprazole 23m26mce daily.      Controlled type 2 diabetes mellitus without complication (HCC)    Clinically stable. Continue current meds. Obtain follow up A1C.       Relevant Orders   Hemoglobin A1c T5Vasic metabolic panel   B12 V61iciency    Pt feels better since b12 injections were started by her GI team.       Other Visit Diagnoses     Needs flu shot    -  Primary   Relevant Orders   Flu Vaccine QUAD High Dose(Fluad) (Completed)   Estradiol deficiency            No orders of the defined types were placed in this encounter.   I, MeliDebbrah Alar personally preformed the services described in this documentation.  All medical record entries made by the scribe were at my direction and in my presence.  I have reviewed the chart and discharge instructions (  if applicable) and agree that the record reflects my personal performance and is accurate and complete. 01/05/2021   I,[name],acting as a Education administrator for Nance Pear, NP.,have documented all relevant documentation on the behalf of Nance Pear, NP,as directed by  Nance Pear, NP while in the presence of Nance Pear, NP.   Nance Pear, NP

## 2021-01-05 NOTE — Assessment & Plan Note (Signed)
LDL at goal. Continue simvastatin 6m.

## 2021-01-05 NOTE — Assessment & Plan Note (Signed)
Pt feels better since b12 injections were started by her GI team.

## 2021-01-05 NOTE — Telephone Encounter (Signed)
Records release form faxed to both

## 2021-01-05 NOTE — Patient Instructions (Signed)
Please complete lab work prior to leaving.   

## 2021-01-05 NOTE — Telephone Encounter (Signed)
Please request copy of bone density report from [facility].

## 2021-01-05 NOTE — Assessment & Plan Note (Signed)
Clinically stable. Continue current meds. Obtain follow up A1C.

## 2021-01-05 NOTE — Telephone Encounter (Signed)
Please call [facility] on [facility] and request copy of her DM eye exam.

## 2021-01-05 NOTE — Assessment & Plan Note (Signed)
Blood pressure is mildly elevated, but pt missed AM meds. Continue Maintained on metoprolol 61m and amlodipine 114m

## 2021-01-07 ENCOUNTER — Telehealth: Payer: Self-pay | Admitting: Family

## 2021-01-07 MED ORDER — OZEMPIC (0.25 OR 0.5 MG/DOSE) 2 MG/1.5ML ~~LOC~~ SOPN: PEN_INJECTOR | SUBCUTANEOUS | 3 refills | Status: DC

## 2021-01-07 NOTE — Telephone Encounter (Signed)
Called but no answer, lvm for patient to call back for results and instructions.

## 2021-01-07 NOTE — Telephone Encounter (Signed)
FYI Spoke to patient about results, provider's advise and medication change. She was very hesitant on the phone to start "injections" even if it is once a week. I tried to explain the benefits of the change. She declines to start Ozempic at this time she "is going to think about it and call back if she decides to go ahead.

## 2021-01-07 NOTE — Telephone Encounter (Signed)
Please advise pt that her A1C remains above goal.  I would like to adjust her medicine. Please as her to stop [place] and instead start ozempic which is a once a week injection. In addition to helping sugar, it can also help weight loss.  She should schedule a nurse visit for administration- bring rx with her to the appointment. Let me know if she has any insurance issues getting the medicine.

## 2021-02-01 ENCOUNTER — Other Ambulatory Visit: Payer: Self-pay | Admitting: Internal Medicine

## 2021-02-05 DIAGNOSIS — K5090 Crohn's disease, unspecified, without complications: Secondary | ICD-10-CM | POA: Diagnosis not present

## 2021-02-18 ENCOUNTER — Other Ambulatory Visit: Payer: Self-pay | Admitting: Family

## 2021-02-18 DIAGNOSIS — I119 Hypertensive heart disease without heart failure: Secondary | ICD-10-CM

## 2021-02-18 DIAGNOSIS — I10 Essential (primary) hypertension: Secondary | ICD-10-CM

## 2021-02-19 ENCOUNTER — Ambulatory Visit (INDEPENDENT_AMBULATORY_CARE_PROVIDER_SITE_OTHER): Payer: PPO | Admitting: Internal Medicine

## 2021-02-19 ENCOUNTER — Encounter: Payer: Self-pay | Admitting: Internal Medicine

## 2021-02-19 ENCOUNTER — Telehealth: Payer: Self-pay | Admitting: Family

## 2021-02-19 DIAGNOSIS — K50119 Crohn's disease of large intestine with unspecified complications: Secondary | ICD-10-CM

## 2021-02-19 DIAGNOSIS — K5900 Constipation, unspecified: Secondary | ICD-10-CM

## 2021-02-19 NOTE — Patient Instructions (Addendum)
If you are age 67 or older, your body mass index should be between 23-30. Your Body mass index is 46.66 kg/m. If this is out of the aforementioned range listed, please consider follow up with your Primary Care Provider. ________________________________________________________  The [place] [facility] providers would like to encourage you to use [facility] to communicate with providers for non-urgent requests or questions.  Due to long hold times on the telephone, sending your provider a message by [facility] may be a faster and more efficient way to get a response.  Please allow 48 business hours for a response.  Please remember that this is for non-urgent requests.  _______________________________________________________  STARTRolan Lipa 80mg daily.  Samples given  CONTINUE: Azathoprine 519m2 tablets daily  You are scheduled to follow up on 05-20-21 at 3:20pm.   Your provider has requested that you go to the basement level for lab work on 03-31-21. Press "B" on the elevator. The lab is located at the first door on the left as you exit the elevator.   Thank you for entrusting me with your care and choosing [facility] Fredrickson

## 2021-02-19 NOTE — Telephone Encounter (Signed)
I attempted to leave  message for patient to call back and schedule Medicare Annual Wellness Visit (AWV) in office. No voice mail.  If not able to come in office, please offer to do virtually or by telephone.  Left office number and my jabber [phone].  Due for AWVI   Please schedule at anytime with Nurse Health Advisor.

## 2021-02-19 NOTE — Progress Notes (Signed)
Subjective:    Patient ID: Kathy [name], female    DOB: Mar 25, 1956, 65 y.o.   MRN: [id number]  HPI Kathy [name] is a 65 year old female with a history of Crohn's disease of the colon and anorectum with prior fistula now on infliximab (started April 2022), hypertension, diabetes hyperlipidemia and asthma who is here for follow-up.  She is here alone today.  She was last seen on 10/23/2020.  She had her sixth infliximab infusion on 02/05/2021.  She continues azathioprine 100 mg daily.  She does use Bentyl 10 mg at times for crampy abdominal discomfort.  She has backed off of MiraLAX because it does not help her produce much stool.  It makes her stools soft and ribbonlike and almost harder to pass at times.  If she takes it more frequently it gives her diarrhea.  She is feeling constipation and incomplete evacuation.  She feels abdominal fullness and pressure that will be relieved if she can have a "good bowel movement".  She has some blood with stool but not daily.  She feels that bleeding is better.  Overall she is "much much better" than when all of her disease with Crohn's started but still having some issues with defecation.  They do have a difficult time obtaining IVs at the infusion center.  This leads to anxiousness prior to her infusions  She is excited because she is going to go to an [employer] football game with her sons, slowly.  She has not traveled since COVID-19 and she is happy to get back out of the house.  Review of Systems As per HPI, otherwise negative  Current Medications, Allergies, Past Medical History, Past Surgical History, Family History and Social History were reviewed in [employer] record.    Objective:   Physical Exam BP 118/64   Pulse 73   Ht 4' 11"  (1.499 m)   Wt 231 lb (104.8 kg)   SpO2 97%   BMI 46.66 kg/m  Gen: awake, alert, NAD HEENT: anicteric CV: RRR, no mrg Pulm: CTA b/l Abd: soft, mild tenderness without  significant distention, obese,  +BS throughout Ext: no c/c/e Neuro: nonfocal  CBC    Component Value Date/Time   WBC 5.5 10/23/2020 1131   RBC 5.15 (H) 10/23/2020 1131   HGB 11.3 (L) 10/23/2020 1131   HGB 11.4 10/11/2018 1009   HCT 35.9 (L) 10/23/2020 1131   HCT 36.7 10/11/2018 1009   PLT 337.0 10/23/2020 1131   PLT 350 10/11/2018 1009   MCV 69.7 Repeated and verified X2. (L) 10/23/2020 1131   MCV 72 (L) 10/11/2018 1009   MCH 21.8 (L) 02/04/2020 1114   MCHC 31.6 10/23/2020 1131   RDW 17.2 (H) 10/23/2020 1131   RDW 13.2 10/11/2018 1009   LYMPHSABS 0.8 10/23/2020 1131   MONOABS 0.6 10/23/2020 1131   EOSABS 0.1 10/23/2020 1131   BASOSABS 0.0 10/23/2020 1131   CMP     Component Value Date/Time   NA 137 01/05/2021 1055   NA 139 10/11/2018 1009   K 3.6 01/05/2021 1055   CL 97 01/05/2021 1055   CO2 31 01/05/2021 1055   GLUCOSE 198 (H) 01/05/2021 1055   BUN 15 01/05/2021 1055   BUN 14 10/11/2018 1009   CREATININE 1.19 01/05/2021 1055   CREATININE 1.49 (H) 02/04/2020 1114   CALCIUM 9.5 01/05/2021 1055   PROT 8.4 (H) 10/03/2020 1051   PROT 7.4 10/11/2018 1009   ALBUMIN 3.8 10/03/2020 1051   ALBUMIN 3.5 (L) 10/11/2018  1009   AST 12 10/03/2020 1051   ALT 7 10/03/2020 1051   ALKPHOS 108 10/03/2020 1051   BILITOT 0.3 10/03/2020 1051   BILITOT 0.3 10/11/2018 1009   GFRNONAA 42 (L) 10/11/2018 1009   GFRAA 48 (L) 10/11/2018 1009          Assessment & Plan:  65 year old female with a history of Crohn's disease of the colon and anorectum with prior fistula now on infliximab (started April 2022), hypertension, diabetes hyperlipidemia and asthma who is here for follow-up.   Crohn's colitis with perianal involvement/history of fistulas (previous Humira every 5 days, bio similar infliximab started April 2022) --she is slowly improving with infliximab and azathioprine therapy.  Some of her symptoms with constipation and defecation relate to her perianal and rectal Crohn's.  I have  encouraged her to hang in there with infliximab because I do think it is helping. --Continue infliximab infusions at [facility], next infusion #7 scheduled for 04/02/2021 --She is again reminded to come to my office for an infliximab level and antibody test on 03/31/2021 --Continue azathioprine 100 mg daily --Lab work done recently at [facility] 10 mg 3 times daily as needed --Consider flexible sigmoidoscopy in the first 6 months of 2023 to assess disease activity on infliximab  2.  Constipation --I think related to her overall Crohn's process but also a component of chronic constipation.  She is having abdominal bloating and incomplete evacuation and MiraLAX has been ineffective.  We discussed this today.  She simply states that MiraLAX does not give enough "forward force" --Trial of Linzess 72 mcg daily; samples provided; may need dose titration and if ineffective or diarrhea results could consider Amitiza, Trulance or Motegrity  3.  GERD with history of esophagitis --continue omeprazole 40 mg daily  I will see her in late January or early February, sooner if needed  30 minutes total spent today including patient facing time, coordination of care, reviewing medical history/procedures/pertinent radiology studies, and documentation of the encounter.

## 2021-03-01 ENCOUNTER — Other Ambulatory Visit: Payer: Self-pay | Admitting: Family

## 2021-03-03 ENCOUNTER — Telehealth: Payer: Self-pay | Admitting: Family

## 2021-03-03 NOTE — Telephone Encounter (Signed)
Patient would like to know if she is still supposed to be taking her Januvia since it was not part of her prescriptions when she went to her pharmacy. Please advice.

## 2021-03-04 MED ORDER — SITAGLIPTIN PHOSPHATE 50 MG PO TABS
50.0000 mg | ORAL_TABLET | Freq: Every day | ORAL | 5 refills | Status: DC
Start: 2021-03-04 — End: 2021-09-02

## 2021-03-04 NOTE — Telephone Encounter (Signed)
Patient confirmed she did not start Ozempic and will like to talk to provider about this on her next appointment. She was advised a rx for [place] was sent to her local pharmacy.

## 2021-03-04 NOTE — Telephone Encounter (Signed)
I had taken it off the list when I sent the injection.  As long as she did not start Ozempic, then she should still be taking Januvia. I will send a refill now.

## 2021-03-08 ENCOUNTER — Other Ambulatory Visit: Payer: Self-pay | Admitting: Internal Medicine

## 2021-03-16 ENCOUNTER — Telehealth: Payer: Self-pay | Admitting: Internal Medicine

## 2021-03-16 MED ORDER — LINACLOTIDE 72 MCG PO CAPS: 72.0000 ug | ORAL_CAPSULE | Freq: Every day | ORAL | 0 refills | Status: DC

## 2021-03-16 NOTE — Telephone Encounter (Signed)
Patient was given samples of Linzess, 72 mcg., and will be taking her last pill tomorrow.  She wants to know what she should do after that.  Please call and advise.  Thank you.

## 2021-03-16 NOTE — Telephone Encounter (Signed)
Rx has been sent for Linzess. In addition, I have placed 12 capsules of Linzess 72 mcg samples at the front desk for patient to pick up while we await insurance approval. Patient verbalizes understanding of this information.

## 2021-03-17 ENCOUNTER — Other Ambulatory Visit: Payer: Self-pay | Admitting: Internal Medicine

## 2021-03-26 ENCOUNTER — Telehealth: Payer: Self-pay | Admitting: Internal Medicine

## 2021-03-26 MED ORDER — LUBIPROSTONE 8 MCG PO CAPS: 8.0000 ug | ORAL_CAPSULE | Freq: Two times a day (BID) | ORAL | 3 refills | Status: DC

## 2021-03-26 NOTE — Telephone Encounter (Signed)
Agree, I would have her try Amitiza 8 mcg BID in place of Linzess I do think constipation is causing some of her symptoms which we discussed recently

## 2021-03-26 NOTE — Telephone Encounter (Signed)
Patient called and wanted to know if the Linzess would increase her Crohn's symptoms.  She is having a sense of urgency to have BM's and cramping.  Please call patient and let her know.  Thank you.

## 2021-03-26 NOTE — Telephone Encounter (Signed)
Pt aware of Dr. Garth Schlatter recommendations, script sent to pharmacy.

## 2021-03-26 NOTE — Telephone Encounter (Signed)
Pt states the linzess is causing her to have bad abd cramping and she states she thinks it has made her crohns worse. She last took it on Tuesday. Reports her abd cramping was bad with it and the pressure of pushing to have a BM was very painful. She states 1st thing in the am she thinks she is going to have a bm and she passes BRB with some clots. Discussed with her she should hold the linzess for now and see if her symptoms get better. Please advise of any other recommendations.

## 2021-03-30 ENCOUNTER — Other Ambulatory Visit: Payer: Self-pay | Admitting: Family

## 2021-03-30 DIAGNOSIS — Z8679 Personal history of other diseases of the circulatory system: Secondary | ICD-10-CM

## 2021-03-31 ENCOUNTER — Other Ambulatory Visit: Payer: PPO

## 2021-03-31 DIAGNOSIS — K5900 Constipation, unspecified: Secondary | ICD-10-CM

## 2021-03-31 DIAGNOSIS — K50119 Crohn's disease of large intestine with unspecified complications: Secondary | ICD-10-CM

## 2021-04-02 DIAGNOSIS — K5090 Crohn's disease, unspecified, without complications: Secondary | ICD-10-CM | POA: Diagnosis not present

## 2021-04-06 ENCOUNTER — Ambulatory Visit (INDEPENDENT_AMBULATORY_CARE_PROVIDER_SITE_OTHER): Payer: PPO | Admitting: Family

## 2021-04-06 VITALS — BP 129/52 | HR 64 | Temp 98.3°F | Resp 16 | Wt 213.0 lb

## 2021-04-06 DIAGNOSIS — E7800 Pure hypercholesterolemia, unspecified: Secondary | ICD-10-CM

## 2021-04-06 DIAGNOSIS — E538 Deficiency of other specified B group vitamins: Secondary | ICD-10-CM | POA: Diagnosis not present

## 2021-04-06 DIAGNOSIS — I10 Essential (primary) hypertension: Secondary | ICD-10-CM

## 2021-04-06 DIAGNOSIS — G43009 Migraine without aura, not intractable, without status migrainosus: Secondary | ICD-10-CM

## 2021-04-06 DIAGNOSIS — E119 Type 2 diabetes mellitus without complications: Secondary | ICD-10-CM

## 2021-04-06 DIAGNOSIS — J45909 Unspecified asthma, uncomplicated: Secondary | ICD-10-CM | POA: Diagnosis not present

## 2021-04-06 DIAGNOSIS — R350 Frequency of micturition: Secondary | ICD-10-CM | POA: Diagnosis not present

## 2021-04-06 LAB — COMPREHENSIVE METABOLIC PANEL
ALT: 7 U/L (ref 0–35)
AST: 9 U/L (ref 0–37)
Albumin: 3.7 g/dL (ref 3.5–5.2)
Alkaline Phosphatase: 107 U/L (ref 39–117)
BUN: 20 mg/dL (ref 6–23)
CO2: 29 mEq/L (ref 19–32)
Calcium: 9.7 mg/dL (ref 8.4–10.5)
Chloride: 99 mEq/L (ref 96–112)
Creatinine, Ser: 1.29 mg/dL — ABNORMAL HIGH (ref 0.40–1.20)
GFR: 42.89 mL/min — ABNORMAL LOW (ref >60.00)
Glucose, Bld: 179 mg/dL — ABNORMAL HIGH (ref 70–99)
Potassium: 4.3 mEq/L (ref 3.5–5.1)
Sodium: 137 mEq/L (ref 135–145)
Total Bilirubin: 0.3 mg/dL (ref 0.2–1.2)
Total Protein: 8.3 g/dL (ref 6.0–8.3)

## 2021-04-06 LAB — HEMOGLOBIN A1C: Hgb A1c MFr Bld: 7.3 % — ABNORMAL HIGH (ref 4.6–6.5)

## 2021-04-06 LAB — VITAMIN B12: Vitamin B-12: 181 pg/mL — ABNORMAL LOW (ref 211–911)

## 2021-04-06 LAB — MICROALBUMIN / CREATININE URINE RATIO
Creatinine,U: 147.9 mg/dL
Microalb Creat Ratio: 1.9 mg/g (ref 0.0–30.0)
Microalb, Ur: 2.8 mg/dL — ABNORMAL HIGH (ref 0.0–1.9)

## 2021-04-06 NOTE — Assessment & Plan Note (Signed)
Stable. Has albuterol for prn use.

## 2021-04-06 NOTE — Assessment & Plan Note (Signed)
She is no longer receiving b12 injections. Will check follow up level.

## 2021-04-06 NOTE — Assessment & Plan Note (Signed)
BP Readings from Last 3 Encounters:  04/06/21 (!) 129/52  02/19/21 118/64  01/05/21 (!) 140/57   BP stable on current medications.  Continue same.

## 2021-04-06 NOTE — Assessment & Plan Note (Signed)
Stable.  Monitor.

## 2021-04-06 NOTE — Assessment & Plan Note (Signed)
Lab Results  Component Value Date   CHOL 121 10/03/2020   HDL 35.60 (L) 10/03/2020   LDLCALC 64 10/03/2020   TRIG 106.0 10/03/2020   CHOLHDL 3 10/03/2020   At goal on simvastatin 82m once daily. Continue same.

## 2021-04-06 NOTE — Assessment & Plan Note (Signed)
She is not willing to consider any injectable medication at this time. Will obtain follow up A1C. Further recommendations following review of A1C.

## 2021-04-06 NOTE — Progress Notes (Signed)
Subjective:   By signing my name below, I, [name], attest that this documentation has been prepared under the direction and in the presence of [name] NP. 04/06/2021    Patient ID: Kathy [name], female    DOB: 03/14/1956, 65 y.o.   MRN: [id number]  Chief Complaint  Patient presents with   Diabetes    Here for follow up   Hyperlipidemia    Here for follow up   Nocturia    Complains of increase urinary frequency at night for about 2 months.     HPI Patient is in today for a office visit.   Frequency- She complains of urinary frequency. It worsens at night. She has difficulty sleeping due frequency. Declines rx for OAB.  Ozempic- During last visit she inquired about ozempic but prefers not to take any injections anymore. She continues taking humira injections to manage her crohn's disease and finds she has increased anxiety over the injections. She prefers taking pills over injections at this time.  Blood sugar- She continues checking her blood sugar regularly and notes they typically measure 120-130. She has not measured any low blood sugars and denies feeling shaky.  Lab Results  Component Value Date   HGBA1C 7.4 (H) 01/05/2021   IBS- She is no longer taking 72 mcg linzess due to causing cramping. She is taking 8 mcg amitiza and reports not being on it long enough to determine if it is effective. This is being managed by GI.  B12- She completed her vitamin B12 injection course and is no longer taking any injections.  Cholesterol- She continues taking 10 mg simvastatin daily PO and reports no new issues while taking it.  Lab Results  Component Value Date   CHOL 121 10/03/2020   HDL 35.60 (L) 10/03/2020   LDLCALC 64 10/03/2020   TRIG 106.0 10/03/2020   CHOLHDL 3 10/03/2020   Blood pressure- Her blood pressure is doing well during this visit. She continues taking 10 mg amlodipine daily PO, 12.5 mg hydrochlorothiazide daily PO, 50 mg metoprolol succinate  daily PO and reports no new issues while taking them.  BP Readings from Last 3 Encounters:  04/06/21 (!) 129/52  02/19/21 118/64  01/05/21 (!) 140/57   Pulse Readings from Last 3 Encounters:  04/06/21 64  02/19/21 73  01/05/21 66   Asthma- She has no recent issues with asthma. She continues taking albuterol if her symptoms worsen and reports not using it recently.  Migraines- She denies having any recent migraines.  Immunizations- She is UTD on flu vaccines this year. She was informed of the bivalent Covid-19 vaccine. She is eligible for the pneumonia vaccine but is not interested in receiving it during this visit.  Vision- She is due for vision care and is planning on scheduling an appointment.    Health Maintenance Due  Topic Date Due   URINE MICROALBUMIN  10/11/2019   COVID-19 Vaccine (4 - Booster for Pfizer series) 04/18/2020   OPHTHALMOLOGY EXAM  02/12/2021    Past Medical History:  Diagnosis Date   Asthma    09-23-2017  per pt has not done inhaler since May 2018, no insurance (QVAR bid and Ventolin prn)   Crohn disease ([place])    Diastolic dysfunction    per echo [phone]  grade 2 diastolic dysfunction , ef 60-65%   Esophagitis    GERD (gastroesophageal reflux disease)    Hemorrhoids    Hiatal hernia    Hyperlipidemia    stopped meds due  to side effects   Hypertension    09-23-2017  per pt has takes bp meds since May 2018 no insurance (losartan 168m qd, HCTZ 12.541mqd, and toprol 50 qd)   Type 2 diabetes mellitus ([place]   09-23-2017 per pt has not taken diabetic meds since May 2018, no insurance (kombiglyze [id number])    Past Surgical History:  Procedure Laterality Date   BREAST BIOPSY Bilateral    CARDIOVASCULAR STRESS TEST  07/29/2009   normal nuclear study w/ no ischemia/  normal LV function and wall motion,  ef 70%   CESAREAN SECTION  1980s   RECTAL BIOPSY N/A 10/03/2017   Procedure: POSSIBLE BIOPSY RECTAL;  Surgeon: WhiIleana RoupD;  Location:  [facility];  Service: General;  Laterality: N/A;    Family History  Problem Relation Age of Onset   Hypertension Mother 70 83Stroke Mother 70 53Diabetes Maternal Aunt        s/p bilater amputation due to DM   Asthma Father    Colon cancer Neg Hx    Esophageal cancer Neg Hx    Stomach cancer Neg Hx    Rectal cancer Neg Hx     Social History   Socioeconomic History   Marital status: Married    Spouse name: Not on file   Number of children: 2   Years of education: Not on file   Highest education level: Not on file  Occupational History   Occupation: unemployed  Tobacco Use   Smoking status: Never   Smokeless tobacco: Never  Vaping Use   Vaping Use: Never used  Substance and Sexual Activity   Alcohol use: No   Drug use: No   Sexual activity: Not Currently  Other Topics Concern   Not on file  Social History Narrative   Not working currently. Retired in 2019. Previously worked in a [employer]   Married    2 children (son and daughter). They are both local   She has 1 grand-daughter and 1 grand-son   No pets (allergic)   Completed 4 years of college.   Social Determinants of Health   Financial Resource Strain: Not on file  Food Insecurity: Not on file  Transportation Needs: Not on file  Physical Activity: Not on file  Stress: Not on file  Social Connections: Not on file  Intimate Partner Violence: Not on file    Outpatient Medications Prior to Visit  Medication Sig Dispense Refill   albuterol (VENTOLIN HFA) 108 (90 Base) MCG/ACT inhaler Inhale 1 puff into the lungs every 6 (six) hours as needed for wheezing or shortness of breath. 18 g 5   amLODipine (NORVASC) 10 MG tablet Take 1 tablet by mouth once daily 90 tablet 0   azaTHIOprine (IMURAN) 50 MG tablet Take 2 tablets by mouth once daily 60 tablet 1   clotrimazole-betamethasone (LOTRISONE) cream Apply 1 application topically 2 (two) times daily. 30 g 1   colchicine 0.6 MG tablet Take 2 tabs by mouth now and 1  tab in 1 hour. 6 tablet 1   Continuous Blood Gluc Receiver (DEXCucumber PLAT PED RCV/SHARE) DEVI Use as directed 1 each 0   cyanocobalamin (,VITAMIN B-12,) 1000 MCG/ML injection Inject 1 mL (1,000 mcg total) into the muscle every 30 (thirty) days. 3 mL 0   dicyclomine (BENTYL) 10 MG capsule TAKE 1 CAPSULE BY MOUTH THREE TIMES DAILY AS NEEDED FOR  CRAMPING 90 capsule 2   diphenoxylate-atropine (LOMOTIL) 2.5-0.025 MG  tablet TAKE 1 TABLET BY MOUTH 4 TIMES DAILY AS NEEDED FOR DIARRHEA OR LOOSE STOOLS 120 tablet 1   glipiZIDE (GLUCOTROL XL) 10 MG 24 hr tablet Take 1 tablet by mouth once daily with breakfast 90 tablet 1   hydrochlorothiazide (MICROZIDE) 12.5 MG capsule Take 1 capsule (12.5 mg total) by mouth daily. 90 capsule 0   hydrocortisone (ANUSOL-HC) 25 MG suppository Place 1 suppository (25 mg total) rectally at bedtime. 14 suppository 0   lubiprostone (AMITIZA) 8 MCG capsule Take 1 capsule (8 mcg total) by mouth 2 (two) times daily with a meal. 60 capsule 3   metoprolol succinate (TOPROL-XL) 50 MG 24 hr tablet Take 1 tablet by mouth once daily 90 tablet 0   Multiple Vitamins-Minerals (MULTIVITAMIN WITH MINERALS) tablet Take 1 tablet by mouth daily.     omeprazole (PRILOSEC) 40 MG capsule Take 1 capsule by mouth once daily 90 capsule 0   polyethylene glycol (MIRALAX / GLYCOLAX) 17 g packet Take 17 g by mouth as needed. 1 capful prn 30 each 11   simvastatin (ZOCOR) 10 MG tablet Take 1 tablet (10 mg total) by mouth daily. 90 tablet 3   sitaGLIPtin (JANUVIA) 50 MG tablet Take 1 tablet (50 mg total) by mouth daily. 30 tablet 5   vitamin B-12 (CYANOCOBALAMIN) 1000 MCG tablet Take 1 tablet (1,000 mcg total) by mouth daily. 1 tablet 0   linaclotide (LINZESS) 72 MCG capsule Take 1 capsule (72 mcg total) by mouth daily before breakfast. 90 capsule 0   No facility-administered medications prior to visit.    Allergies  Allergen Reactions   Lipitor [Atorvastatin Calcium]     Leg cramps    Lisinopril  Itching   Pravachol [Pravastatin Sodium]    Pravastatin Sodium Other (See Comments)    leg cramps    Review of Systems  Genitourinary:  Positive for frequency.  Neurological:  Negative for headaches (migraines).      Objective:    Physical Exam Constitutional:      General: She is not in acute distress.    Appearance: Normal appearance. She is not ill-appearing.  HENT:     Head: Normocephalic and atraumatic.     Right Ear: External ear normal.     Left Ear: External ear normal.  Eyes:     Extraocular Movements: Extraocular movements intact.     Pupils: Pupils are equal, round, and reactive to light.  Cardiovascular:     Rate and Rhythm: Normal rate and regular rhythm.     Heart sounds: Normal heart sounds. No murmur heard.   No gallop.  Pulmonary:     Effort: Pulmonary effort is normal. No respiratory distress.     Breath sounds: Normal breath sounds. No wheezing or rales.  Skin:    General: Skin is warm and dry.  Neurological:     Mental Status: She is alert and oriented to person, place, and time.  Psychiatric:        Behavior: Behavior normal.    BP (!) 129/52 (BP Location: Right Arm, Patient Position: Sitting, Cuff Size: Large)   Pulse 64   Temp 98.3 F (36.8 C) (Oral)   Resp 16   Wt 213 lb (96.6 kg)   SpO2 100%   BMI 43.02 kg/m  Wt Readings from Last 3 Encounters:  04/06/21 213 lb (96.6 kg)  02/19/21 231 lb (104.8 kg)  01/05/21 213 lb (96.6 kg)       Assessment & Plan:   Problem List Items Addressed  This Visit       Unprioritized   Hyperlipidemia (Chronic)    Lab Results  Component Value Date   CHOL 121 10/03/2020   HDL 35.60 (L) 10/03/2020   LDLCALC 64 10/03/2020   TRIG 106.0 10/03/2020   CHOLHDL 3 10/03/2020  At goal on simvastatin 56m once daily. Continue same.       Essential hypertension (Chronic)    BP Readings from Last 3 Encounters:  04/06/21 (!) 129/52  02/19/21 118/64  01/05/21 (!) 140/57  BP stable on current medications.   Continue same.       Relevant Orders   Comp Met (CMET)   Migraine without aura    Stable.  Monitor.       FREQUENCY, URINARY   Relevant Orders   Urine Culture   Controlled type 2 diabetes mellitus without complication ([place] - Primary    She is not willing to consider any injectable medication at this time. Will obtain follow up A1C. Further recommendations following review of A1C.       Relevant Orders   Hemoglobin A1c   Urine Microalbumin w/creat. ratio   Comp Met (CMET)   B12 deficiency    She is no longer receiving b12 injections. Will check follow up level.       Relevant Orders   B12   Asthma    Stable. Has albuterol for prn use.         No orders of the defined types were placed in this encounter.   I, MDebbrah AlarNP, personally preformed the services described in this documentation.  All medical record entries made by the scribe were at my direction and in my presence.  I have reviewed the chart and discharge instructions (if applicable) and agree that the record reflects my personal performance and is accurate and complete. 04/06/2021   I,[name],acting as a sEducation administratorfor MNance Pear NP.,have documented all relevant documentation on the behalf of MNance Pear NP,as directed by  MNance Pear NP while in the presence of MNance Pear NP.   MNance Pear NP

## 2021-04-06 NOTE — Patient Instructions (Signed)
Please schedule your diabetic eye exam.  Complete lab work prior to leaving.

## 2021-04-07 LAB — INFLIXIMAB+AB (SERIAL MONITOR)
Anti-Infliximab Antibody: <22 ng/mL
Infliximab Drug Level: 0.4 ug/mL

## 2021-04-07 LAB — SERIAL MONITORING

## 2021-04-08 ENCOUNTER — Telehealth: Payer: Self-pay | Admitting: Family

## 2021-04-08 ENCOUNTER — Encounter: Payer: Self-pay | Admitting: Family

## 2021-04-08 DIAGNOSIS — I10 Essential (primary) hypertension: Secondary | ICD-10-CM

## 2021-04-08 MED ORDER — VALSARTAN 40 MG PO TABS: 40.0000 mg | ORAL_TABLET | Freq: Every day | ORAL | 3 refills | Status: DC

## 2021-04-08 MED ORDER — METOPROLOL SUCCINATE ER 50 MG PO TB24: 25.0000 mg | ORAL_TABLET | Freq: Every day | ORAL | 0 refills | Status: DC

## 2021-04-08 NOTE — Telephone Encounter (Signed)
Also, needs nurse visit in 1 week for bp check and bmet dx HTN.

## 2021-04-08 NOTE — Telephone Encounter (Signed)
B12 level remains low.  I think she needs to restart b12 injections and continue them long term.  Begin 1018mg b12 IM weekly x 4 weeks then once monthly.  Sugar remains slightly above goal. Continue current meds and work on diabetic diet.   There is protein in her urine from the diabetes. I would recommend that she add valsartan 477monce daily for kidney protection and cut her metoprolol in half and take 1/2 tab once daily.

## 2021-04-08 NOTE — Telephone Encounter (Signed)
Patient advised of results, provider's advise, new medication, change on metoprolol dose and scheduled for B12 shot 1 of 4, BP check and labs 04-16-21.   Patient verbalized understanding but "a lot of information". MyChart message sent to her with the information written in details.

## 2021-04-09 ENCOUNTER — Telehealth: Payer: Self-pay | Admitting: Internal Medicine

## 2021-04-09 LAB — URINE CULTURE
MICRO NUMBER:: 12744344
SPECIMEN QUALITY:: ADEQUATE

## 2021-04-09 NOTE — Telephone Encounter (Signed)
Patient received call from [facility] letting her know the program they have with pfizer will not pay for her [name] since you have increased the infusion from every 8 weeks to every 6 weeks. Pt is supposed to have her next infusion 04/13/21. [facility] gave her information to call [facility] and Rx outreach but she knows she will not be approved prior to the 12/19 date. She wanted to let Dr. Hilarie Fredrickson know.

## 2021-04-09 NOTE — Telephone Encounter (Signed)
Patient called wanting to speak with someone about her infusions. She states the dosage was changed from 6 weeks to 8 weeks and the program that was helping her pay for them is no longer going to be available.  Please call.

## 2021-04-09 NOTE — Telephone Encounter (Signed)
Patient is on bio similar infliximab Has active disease and trough levels are low  I am trying to increase to 10 mg/kg every 6 weeks  She has been getting this at [facility] through assistance program  [place] and Joelene Millin, could you possibly help get this patient medication infliximab, bio similar okay, 10 mg/kg every 6 weeks

## 2021-04-09 NOTE — Telephone Encounter (Signed)
Dr. Hilarie Fredrickson, I will begin the benefits investigation and f/u once I have a response.

## 2021-04-10 ENCOUNTER — Other Ambulatory Visit: Payer: Self-pay | Admitting: Pharmacy Technician

## 2021-04-10 ENCOUNTER — Telehealth: Payer: Self-pay | Admitting: Family

## 2021-04-10 MED ORDER — CEPHALEXIN 500 MG PO CAPS: 500.0000 mg | ORAL_CAPSULE | Freq: Three times a day (TID) | ORAL | 0 refills | Status: AC

## 2021-04-10 NOTE — Telephone Encounter (Signed)
Dr. Hilarie Fredrickson, Yes, she will be dosed 10 mg/kg q6wks.

## 2021-04-10 NOTE — Telephone Encounter (Signed)
Spoke with pt and she is aware that the medication has been approved for every 6 week dosing. Let her know she should receive a phone call next week to [facility]

## 2021-04-10 NOTE — Telephone Encounter (Signed)
Kathy [name], We will try to get her scheduled in the beginning of next week.  Our scheduling will reach out to her to schedule appt.

## 2021-04-10 NOTE — Telephone Encounter (Addendum)
Dr. Hilarie Fredrickson,  Auth Submission: APPROVED Payer: [employer] Medication & CPT/J Code(s) submitted: Remicade (Infliximab) J1745 Route of submission (phone, fax, portal): FAX [phone] PHONE: [phone] Auth type: Buy/Bill Units/visits requested:100 UNITS  10 MG/KG Q6 WEEKS Reference number: [id number] Approval from: 04/13/21 to 07/12/21  Type of service: Medical Benefit - verified w/ [place] 04/10/21  Patient will be scheduled as soon as possible.  @[place]  please enter therapy plan.  Ty Maudie Mercury

## 2021-04-10 NOTE — Telephone Encounter (Signed)
Kathy [name] Thank you! I would like her to be dosed 10 mg/kg every 6 weeks (not 8 as her trough level was low at 8 week dosing)  [name], Please let patient know the med can now be covered at the [facility].  Thanks to all , JMP

## 2021-04-10 NOTE — Telephone Encounter (Signed)
Patient advised of results and new rx

## 2021-04-10 NOTE — Telephone Encounter (Signed)
Urine culture shows UTI. I would like her to start keflex.

## 2021-04-10 NOTE — Telephone Encounter (Signed)
I will let her know. She stated that her next infusion is supposed to be on 12/19. Will you schedule her the appt?

## 2021-04-14 ENCOUNTER — Telehealth: Payer: Self-pay | Admitting: Internal Medicine

## 2021-04-14 NOTE — Telephone Encounter (Signed)
I have spoken to patient to advise that she can search the [place] and find some local resources to reach out to. She verbalizes understanding.

## 2021-04-14 NOTE — Telephone Encounter (Signed)
Patient called requesting information on groups/websites/etc. She could reach out to for support with her Chron's disease.  She said she would like to speak/connect with other people who have it as well.  Please advise if you have such information you can share with her.  Thank you.

## 2021-04-16 ENCOUNTER — Ambulatory Visit (INDEPENDENT_AMBULATORY_CARE_PROVIDER_SITE_OTHER): Payer: PPO

## 2021-04-16 ENCOUNTER — Other Ambulatory Visit (INDEPENDENT_AMBULATORY_CARE_PROVIDER_SITE_OTHER): Payer: PPO

## 2021-04-16 VITALS — BP 138/67 | HR 68

## 2021-04-16 DIAGNOSIS — E538 Deficiency of other specified B group vitamins: Secondary | ICD-10-CM

## 2021-04-16 DIAGNOSIS — I10 Essential (primary) hypertension: Secondary | ICD-10-CM

## 2021-04-16 LAB — BASIC METABOLIC PANEL
BUN: 13 mg/dL (ref 6–23)
CO2: 32 mEq/L (ref 19–32)
Calcium: 9.6 mg/dL (ref 8.4–10.5)
Chloride: 99 mEq/L (ref 96–112)
Creatinine, Ser: 1.29 mg/dL — ABNORMAL HIGH (ref 0.40–1.20)
GFR: 42.88 mL/min — ABNORMAL LOW (ref >60.00)
Glucose, Bld: 223 mg/dL — ABNORMAL HIGH (ref 70–99)
Potassium: 4.1 mEq/L (ref 3.5–5.1)
Sodium: 137 mEq/L (ref 135–145)

## 2021-04-16 MED ORDER — CYANOCOBALAMIN 1000 MCG/ML IJ SOLN
1000.0000 ug | Freq: Once | INTRAMUSCULAR | Status: AC
  Administered 2021-04-16: 11:00:00 1000 ug via INTRAMUSCULAR

## 2021-04-16 NOTE — Progress Notes (Signed)
Kathy [name] is a 65 y.o. female presents to the office today for B12 :  injection  # 1 of 4 weekly per physician's orders. Original order: from 12/14 to restart B12 injections for ongoing low B12 levels.  Cyanocobalamin  (med), 1000 mcg/ml (dose),  IM (route) was administered Left deltoid (location) today. Patient tolerated injection.  Patient next injection due: in one week, appt made Yes for 04-23-21.  She is also here for BP recheck per Melissa's orders from 04/08/21.  BP Readings from Last 3 Encounters:  04/06/21 (!) 129/52  02/19/21 118/64  01/05/21 (!) 140/57   BP today is 138/67 with a pulse of [place]

## 2021-04-20 ENCOUNTER — Other Ambulatory Visit: Payer: Self-pay | Admitting: Family

## 2021-04-20 DIAGNOSIS — E119 Type 2 diabetes mellitus without complications: Secondary | ICD-10-CM

## 2021-04-22 ENCOUNTER — Telehealth: Payer: Self-pay

## 2021-04-22 NOTE — Telephone Encounter (Signed)
-----   Message from Wynetta Fines, CPhT sent at 04/22/2021  1:47 PM EST ----- Regarding: RE: PATIENT ASSISTANCE - MD SIGNATURE Thanks, I will re-fax ----- Message ----- From: Yevette Edwards, RN Sent: 04/22/2021   1:13 PM EST To: Wynetta Fines, CPhT, Gillermina Hu, RN Subject: RE: PATIENT ASSISTANCE - MD Vivianne Master, We have not received the fax. Which number did you fax it to? You can try faxing it [phone] or [phone]. ----- Message ----- From: Wynetta Fines, CPhT Sent: 04/22/2021  11:18 AM EST To: Algernon Huxley, RN, Gillermina Hu, RN, # Subject: PATIENT ASSISTANCE - MD SIGNATURE              I have faxed forms for Dr. Hilarie Fredrickson to sign for the [facility]. Section 1 will need to be completed and section 3 will need signature. Once the form have been completed and signed please fax back to me @ [phone] as soon as possible to prevent delay in treatment. Thanks Maudie Mercury

## 2021-04-22 NOTE — Telephone Encounter (Signed)
-----   Message from Wynetta Fines, CPhT sent at 04/22/2021  1:47 PM EST ----- Regarding: RE: PATIENT ASSISTANCE - MD SIGNATURE Thanks, I will re-fax ----- Message ----- From: Yevette Edwards, RN Sent: 04/22/2021   1:13 PM EST To: Wynetta Fines, CPhT, Gillermina Hu, RN Subject: RE: PATIENT ASSISTANCE - MD Vivianne Master, We have not received the fax. Which number did you fax it to? You can try faxing it [phone] or [phone]. ----- Message ----- From: Wynetta Fines, CPhT Sent: 04/22/2021  11:18 AM EST To: Algernon Huxley, RN, Gillermina Hu, RN, # Subject: PATIENT ASSISTANCE - MD SIGNATURE              I have faxed forms for Dr. Hilarie Fredrickson to sign for the [facility]. Section 1 will need to be completed and section 3 will need signature. Once the form have been completed and signed please fax back to me @ [phone] as soon as possible to prevent delay in treatment. Thanks Maudie Mercury

## 2021-04-22 NOTE — Telephone Encounter (Signed)
Documents completed and signed and faxed to [phone]

## 2021-04-23 ENCOUNTER — Ambulatory Visit (INDEPENDENT_AMBULATORY_CARE_PROVIDER_SITE_OTHER): Payer: PPO

## 2021-04-23 DIAGNOSIS — E538 Deficiency of other specified B group vitamins: Secondary | ICD-10-CM | POA: Diagnosis not present

## 2021-04-23 MED ORDER — CYANOCOBALAMIN 1000 MCG/ML IJ SOLN
1000.0000 ug | Freq: Once | INTRAMUSCULAR | Status: AC
Start: 2021-04-23 — End: 2021-04-23
  Administered 2021-04-23: 11:00:00 1000 ug via INTRAMUSCULAR

## 2021-04-23 NOTE — Progress Notes (Signed)
Pt here for 2/4 weekly B12 injection per [name], NP   B12 1070mg given right deltoid, and pt tolerated injection well.   Next B12 injection scheduled for 04/30/21

## 2021-04-24 NOTE — Telephone Encounter (Signed)
Noted  

## 2021-04-24 NOTE — Telephone Encounter (Signed)
[facility] called patient last night around 6 p.m. stating they needed more information from the provider. Info needed is:  When is the first failed date for Remicade? PTAN  Expiration date of provider's license Documents of how much the medicine costs.  Please fax this information for patient to:  [phone]

## 2021-04-24 NOTE — Telephone Encounter (Signed)
Mickel Baas, I will give [facility] Overall a f/u call. Thanks.

## 2021-04-30 ENCOUNTER — Ambulatory Visit (INDEPENDENT_AMBULATORY_CARE_PROVIDER_SITE_OTHER): Payer: PPO

## 2021-04-30 DIAGNOSIS — E538 Deficiency of other specified B group vitamins: Secondary | ICD-10-CM | POA: Diagnosis not present

## 2021-04-30 MED ORDER — CYANOCOBALAMIN 1000 MCG/ML IJ SOLN
1000.0000 ug | Freq: Once | INTRAMUSCULAR | Status: AC
  Administered 2021-04-30: 1000 ug via INTRAMUSCULAR

## 2021-04-30 NOTE — Progress Notes (Signed)
Kathy [name] is a 66 y.o. female presents to the office today for B12 :  injection  # 3 of 4 weekly per physician's orders. Original order: from 12/14 to restart B12 injections for ongoing low B12 levels.  Cyanocobalamin  (med), 1000 mcg/ml (dose),  IM (route) was administered right deltoid (location) today. Patient tolerated injection.   Patient next injection due: in one week, appt made Yes for 03-07-2022

## 2021-05-07 ENCOUNTER — Ambulatory Visit (INDEPENDENT_AMBULATORY_CARE_PROVIDER_SITE_OTHER): Payer: PPO

## 2021-05-07 DIAGNOSIS — E538 Deficiency of other specified B group vitamins: Secondary | ICD-10-CM | POA: Diagnosis not present

## 2021-05-07 MED ORDER — CYANOCOBALAMIN 1000 MCG/ML IJ SOLN
1000.0000 ug | Freq: Once | INTRAMUSCULAR | Status: AC
  Administered 2021-05-07: 1000 ug via INTRAMUSCULAR

## 2021-05-07 NOTE — Progress Notes (Signed)
Pt here for 4/4 weekly B12 injection per [name], NP   B12 1020mg given left deltoid, and pt tolerated injection well.

## 2021-05-07 NOTE — Progress Notes (Deleted)
Kathy [name] is a 66 y.o. female presents to the office today for B12 :  injection  4 of 4 weekly per physician's orders.  Original order: from 12/14 to restart B12 injections for ongoing low B12 levels.   Injection given in left deltoid today. Patient tolerated injection.   Patient next injection due: in one month.   Appt made for 06/09/21

## 2021-05-14 ENCOUNTER — Other Ambulatory Visit: Payer: Self-pay | Admitting: Internal Medicine

## 2021-05-19 NOTE — Telephone Encounter (Signed)
F/U NOTE: JANSEN PAP- BIV has been completed (05/18/22) by Ranelle Oyster. Awaiting patient to receive phone call from [place] to give patient consent for [facility] to receive medication on her behalf. I have requested this to be an URGENT case to prevent any further delay in treatment. Rep: Nikki-J. PHONE: [phone]

## 2021-05-20 ENCOUNTER — Ambulatory Visit (INDEPENDENT_AMBULATORY_CARE_PROVIDER_SITE_OTHER): Payer: PPO | Admitting: Internal Medicine

## 2021-05-20 ENCOUNTER — Encounter: Payer: Self-pay | Admitting: Internal Medicine

## 2021-05-20 VITALS — BP 128/66 | HR 60 | Ht 59.0 in | Wt 213.2 lb

## 2021-05-20 DIAGNOSIS — E538 Deficiency of other specified B group vitamins: Secondary | ICD-10-CM | POA: Diagnosis not present

## 2021-05-20 DIAGNOSIS — K5900 Constipation, unspecified: Secondary | ICD-10-CM

## 2021-05-20 DIAGNOSIS — K50119 Crohn's disease of large intestine with unspecified complications: Secondary | ICD-10-CM | POA: Diagnosis not present

## 2021-05-20 DIAGNOSIS — K2100 Gastro-esophageal reflux disease with esophagitis, without bleeding: Secondary | ICD-10-CM

## 2021-05-20 NOTE — Patient Instructions (Signed)
Continue azathioprine, Amitiza and omeprazole.  Please follow up with Dr. Hilarie Fredrickson in the beginning of April. We do not have a schedule at this time. Please call our office back in 2 weeks to schedule.  The [facility] providers would like to encourage you to use [facility] to communicate with providers for non-urgent requests or questions.  Due to long hold times on the telephone, sending your provider a message by [facility] may be a faster and more efficient way to get a response.  Please allow 48 business hours for a response.  Please remember that this is for non-urgent requests.

## 2021-05-20 NOTE — Progress Notes (Signed)
Subjective:    Patient ID: Kathy [name], female    DOB: 02-Dec-1955, 66 y.o.   MRN: [id number]  HPI Kathy [name] is a 66 year old female with a history of Crohn's disease of the colon and anorectum with prior fistula on infliximab (started April 2022), hypertension, diabetes, hyperlipidemia and asthma who is here for follow-up.  She was last seen on 02/19/2021 and is here alone today.  Her seventh and last infliximab infusion was on 04/02/2021.  She had been getting assistance for bio similar infliximab but [employer] notified her by mail that her support would be ending.  Therefore I had started the process of getting her transferred to the [facility] and she has been waiting on hopeful approval for patient assistance to receive Remicade.  As of now she is about 1 week overdue for a 6-week infusion.  She reports that she has good and bad days.  She has been using lubiprostone 8 mcg twice daily.  Occasionally she will use this once a day if she is having frequent bowel movements.  She tried Linzess but this caused cramping and abdominal pain and diarrhea.  Stools vary though are usually formed from once a day to 6 times per day.  Yesterday was a particularly bad day for her where she had 6 formed stools between 1 PM and 1 AM.  This can be associated with severe lower abdominal cramping.  Does not relate to eating.  Does see intermittent blood or clots with bowel movements but not every day.  No upper GI or hepatobiliary complaint.  She continues azathioprine daily she is on omeprazole as well  She did travel to an [employer] game with her son and had a great time.   Review of Systems As per HPI, otherwise negative  Current Medications, Allergies, Past Medical History, Past Surgical History, Family History and Social History were reviewed in [employer] record.    Objective:   Physical Exam BP 128/66    Pulse 60    Ht 4' 11"  (1.499 m)     Wt 213 lb 3.2 oz (96.7 kg)    BMI 43.06 kg/m  Gen: awake, alert, NAD HEENT: anicteric Neuro: nonfocal  Infliximab at trough 0.4; antibody negative    Assessment & Plan:  66 year old female with a history of Crohn's disease of the colon and anorectum with prior fistula on infliximab (started April 2022), hypertension, diabetes, hyperlipidemia and asthma who is here for follow-up.   Crohn's colitis with perianal involvement and history of fistula (infliximab since April 2022; previously Humira but unable to achieve response or adequate drug level despite therapy every 5 days) --we have had a very difficult time with achieving adequate drug levels of infliximab but also previously adalimumab.  For this reason her disease has remained less than completely controlled particularly in the distal colon and rectum.  She is taking azathioprine but is now overdue for infliximab --[facility] and pharmacy Wynetta Fines has been helping hopefully obtain Remicade assistance I recommended reinitiation with loading dose --Plan Remicade when approved 10 mg/kg loading with 3 infusions in 6 weeks; followed by 10 mg/kg every 6 weeks going forward --Once established on consistent therapy repeat Remicade level at trough, if low then we will likely need to switch to Stelara --Continue azathioprine 100 mg daily --Continue Bentyl 10 mg 3 times daily as needed  2.  Mild constipation --I think she is getting better evacuation with low-dose Amitiza but she can  continue to use this 1 or 2 times a day to try to help assist bowel movements.  Her lower abdominal cramping is not necessarily worse with Amitiza.  We will follow this symptom over time --Amitiza 8 mcg twice daily  3.  GERD with esophagitis history --continue omeprazole 40 mg daily  4.  B12 deficiency --fatigue improved since primary care has been treating B12 deficiency recently with iron therapy  30 minutes total spent today including  patient facing time, coordination of care, reviewing medical history/procedures/pertinent radiology studies, and documentation of the encounter.

## 2021-05-22 ENCOUNTER — Encounter: Payer: Self-pay | Admitting: Internal Medicine

## 2021-05-22 NOTE — Telephone Encounter (Signed)
Due to extreme hold times and PAP program being behind in processing request.  Patient will be scheduled asap. Please adjust stock once approval is processed.

## 2021-05-25 ENCOUNTER — Other Ambulatory Visit: Payer: Self-pay | Admitting: Family

## 2021-05-25 DIAGNOSIS — I119 Hypertensive heart disease without heart failure: Secondary | ICD-10-CM

## 2021-05-25 NOTE — Telephone Encounter (Signed)
Kathy [name] PAP Patient has been approved for remicade - free drug Date: 05/06/21 - 04/25/22 Id# [id number] Phone: [phone] Patient will need to re-enroll every year.  Fyi note: once medication arrives send new script (restarting initiation ([id number])

## 2021-05-28 ENCOUNTER — Ambulatory Visit (INDEPENDENT_AMBULATORY_CARE_PROVIDER_SITE_OTHER): Payer: PPO

## 2021-05-28 ENCOUNTER — Other Ambulatory Visit: Payer: Self-pay

## 2021-05-28 VITALS — BP 134/79 | HR 67 | Temp 97.8°F | Resp 16 | Ht 59.0 in | Wt 212.2 lb

## 2021-05-28 DIAGNOSIS — Z5112 Encounter for antineoplastic immunotherapy: Secondary | ICD-10-CM

## 2021-05-28 DIAGNOSIS — K50111 Crohn's disease of large intestine with rectal bleeding: Secondary | ICD-10-CM | POA: Diagnosis not present

## 2021-05-28 MED ORDER — SODIUM CHLORIDE 0.9 % IV SOLN
10.0000 mg/kg | Freq: Once | INTRAVENOUS | Status: AC
  Administered 2021-05-28: 1000 mg via INTRAVENOUS
  Filled 2021-05-28: qty 100

## 2021-05-28 MED ORDER — DIPHENHYDRAMINE HCL 25 MG PO CAPS
25.0000 mg | ORAL_CAPSULE | Freq: Once | ORAL | Status: AC
  Administered 2021-05-28: 25 mg via ORAL
  Filled 2021-05-28: qty 1

## 2021-05-28 MED ORDER — ACETAMINOPHEN 325 MG PO TABS
650.0000 mg | ORAL_TABLET | Freq: Once | ORAL | Status: AC
  Administered 2021-05-28: 325 mg via ORAL
  Filled 2021-05-28: qty 2

## 2021-05-28 MED ORDER — HYDROCORTISONE NA SUCCINATE PF 100 MG IJ SOLR
100.0000 mg | Freq: Once | INTRAMUSCULAR | Status: AC
  Administered 2021-05-28: 100 mg via INTRAVENOUS
  Filled 2021-05-28: qty 2

## 2021-05-28 NOTE — Progress Notes (Signed)
Diagnosis: Crohn's Disease  Provider:  Marshell Garfinkel, MD  Procedure: Infusion  IV Type: Peripheral, IV Location: L Antecubital  Remicade (Infliximab), Dose: 1000 mg  Infusion Start Time: 2960  Infusion Stop Time: 3905  Post Infusion IV Care: Observation period completed  Discharge: Condition: Good, Destination: Home . AVS provided to patient.   Performed by:  Paul Dykes, RN

## 2021-06-03 NOTE — Telephone Encounter (Signed)
Received f/u phone call from [place] medication will arrive 06/05/21 PHONE: [phone] Rep: Haig Prophet (team lead rep)

## 2021-06-03 NOTE — Telephone Encounter (Signed)
F/U: Alphonsa Overall PAP Still awaiting [place] to contact patient. Per Rep Diamond-B all paperwork is correct and completed she could not give any reasonable answer as to why process has been delayed.  Requested to speak with supervisor in order to escalate process. (Was transferred to supervisor and remained on hold for extreme time) Left contact info and awaiting call back.

## 2021-06-09 NOTE — Telephone Encounter (Signed)
F/u [facility] PAP: Received x10 vials (replacement for [facility] stock). Patient has re-started induction d0, d14, d42 then q6wks. New script has been faxed to pharmacy Phone: [phone] Fax: [phone]

## 2021-06-10 ENCOUNTER — Other Ambulatory Visit: Payer: Self-pay | Admitting: Family

## 2021-06-10 ENCOUNTER — Other Ambulatory Visit: Payer: Self-pay | Admitting: Internal Medicine

## 2021-06-11 ENCOUNTER — Ambulatory Visit (INDEPENDENT_AMBULATORY_CARE_PROVIDER_SITE_OTHER): Payer: PPO

## 2021-06-11 ENCOUNTER — Other Ambulatory Visit: Payer: Self-pay

## 2021-06-11 VITALS — BP 121/57 | HR 63 | Temp 97.6°F | Resp 24 | Ht 59.0 in | Wt 213.2 lb

## 2021-06-11 DIAGNOSIS — K50111 Crohn's disease of large intestine with rectal bleeding: Secondary | ICD-10-CM

## 2021-06-11 DIAGNOSIS — Z5112 Encounter for antineoplastic immunotherapy: Secondary | ICD-10-CM

## 2021-06-11 MED ORDER — FAMOTIDINE IN NACL 20-0.9 MG/50ML-% IV SOLN: 20.0000 mg | Freq: Once | INTRAVENOUS | Status: DC | PRN

## 2021-06-11 MED ORDER — DIPHENHYDRAMINE HCL 25 MG PO CAPS
25.0000 mg | ORAL_CAPSULE | Freq: Once | ORAL | Status: AC
  Administered 2021-06-11: 25 mg via ORAL
  Filled 2021-06-11: qty 1

## 2021-06-11 MED ORDER — ACETAMINOPHEN 325 MG PO TABS
650.0000 mg | ORAL_TABLET | Freq: Once | ORAL | Status: AC
  Administered 2021-06-11: 650 mg via ORAL
  Filled 2021-06-11: qty 2

## 2021-06-11 MED ORDER — EPINEPHRINE 0.3 MG/0.3ML IJ SOAJ: 0.3000 mg | Freq: Once | INTRAMUSCULAR | Status: DC | PRN

## 2021-06-11 MED ORDER — ALBUTEROL SULFATE HFA 108 (90 BASE) MCG/ACT IN AERS: 2.0000 | INHALATION_SPRAY | Freq: Once | RESPIRATORY_TRACT | Status: DC | PRN

## 2021-06-11 MED ORDER — METHYLPREDNISOLONE SODIUM SUCC 125 MG IJ SOLR: 125.0000 mg | Freq: Once | INTRAMUSCULAR | Status: DC | PRN

## 2021-06-11 MED ORDER — SODIUM CHLORIDE 0.9 % IV SOLN
10.0000 mg/kg | Freq: Once | INTRAVENOUS | Status: AC
  Administered 2021-06-11: 1000 mg via INTRAVENOUS
  Filled 2021-06-11: qty 100

## 2021-06-11 MED ORDER — SODIUM CHLORIDE 0.9 % IV SOLN: Freq: Once | INTRAVENOUS | Status: DC | PRN

## 2021-06-11 MED ORDER — HYDROCORTISONE NA SUCCINATE PF 100 MG IJ SOLR
100.0000 mg | Freq: Once | INTRAMUSCULAR | Status: AC
  Administered 2021-06-11: 100 mg via INTRAVENOUS
  Filled 2021-06-11: qty 2

## 2021-06-11 MED ORDER — DIPHENHYDRAMINE HCL 50 MG/ML IJ SOLN: 50.0000 mg | Freq: Once | INTRAMUSCULAR | Status: DC | PRN

## 2021-06-11 NOTE — Progress Notes (Signed)
Diagnosis: Crohn's Disease  Provider:  Marshell Garfinkel, MD  Procedure: Infusion  IV Type: Peripheral, IV Location: L Antecubital  Remicade (Infliximab), Dose: 1000 mg  Infusion Start Time: 2867  Infusion Stop Time: 5198  Post Infusion IV Care: Peripheral IV Discontinued  Discharge: Condition: Good, Destination: Home . AVS provided to patient.   Performed by:  Koren Shiver, RN

## 2021-06-17 ENCOUNTER — Other Ambulatory Visit: Payer: Self-pay | Admitting: Internal Medicine

## 2021-06-19 ENCOUNTER — Telehealth: Payer: Self-pay | Admitting: Family

## 2021-06-19 NOTE — Telephone Encounter (Signed)
I attempted to leave message for patient to call back and schedule Medicare Annual Wellness Visit (AWV) in office. No answer.  If not able to come in office, please offer to do virtually or by telephone.  Left office number and my jabber [phone].  Last AWV:10/11/2018  Please schedule at anytime with Nurse Health Advisor.

## 2021-06-28 ENCOUNTER — Other Ambulatory Visit: Payer: Self-pay | Admitting: Family

## 2021-06-28 DIAGNOSIS — Z8679 Personal history of other diseases of the circulatory system: Secondary | ICD-10-CM

## 2021-06-28 DIAGNOSIS — I10 Essential (primary) hypertension: Secondary | ICD-10-CM

## 2021-07-08 ENCOUNTER — Ambulatory Visit (INDEPENDENT_AMBULATORY_CARE_PROVIDER_SITE_OTHER): Payer: PPO | Admitting: Family

## 2021-07-08 VITALS — BP 128/75 | HR 63 | Temp 98.2°F | Resp 16 | Wt 213.0 lb

## 2021-07-08 DIAGNOSIS — N1832 Chronic kidney disease, stage 3b: Secondary | ICD-10-CM | POA: Diagnosis not present

## 2021-07-08 DIAGNOSIS — M25559 Pain in unspecified hip: Secondary | ICD-10-CM | POA: Diagnosis not present

## 2021-07-08 DIAGNOSIS — E119 Type 2 diabetes mellitus without complications: Secondary | ICD-10-CM | POA: Diagnosis not present

## 2021-07-08 DIAGNOSIS — I10 Essential (primary) hypertension: Secondary | ICD-10-CM | POA: Diagnosis not present

## 2021-07-08 LAB — BASIC METABOLIC PANEL
BUN: 17 mg/dL (ref 6–23)
CO2: 30 mEq/L (ref 19–32)
Calcium: 9.9 mg/dL (ref 8.4–10.5)
Chloride: 97 mEq/L (ref 96–112)
Creatinine, Ser: 1.46 mg/dL — ABNORMAL HIGH (ref 0.40–1.20)
GFR: 36.9 mL/min — ABNORMAL LOW (ref >60.00)
Glucose, Bld: 156 mg/dL — ABNORMAL HIGH (ref 70–99)
Potassium: 4.3 mEq/L (ref 3.5–5.1)
Sodium: 135 mEq/L (ref 135–145)

## 2021-07-08 LAB — HEMOGLOBIN A1C: Hgb A1c MFr Bld: 7.8 % — ABNORMAL HIGH (ref 4.6–6.5)

## 2021-07-08 NOTE — Patient Instructions (Signed)
Please complete lab work prior to leaving.  ?Add tylenol 558m once daily in the AM to see if it helps with your hip pain.  ?

## 2021-07-08 NOTE — Progress Notes (Signed)
? ?Subjective:  ? ? ? Patient ID: Kathy [name], female    DOB: 04/29/1955, 66 y.o.   MRN: [id number] ? ?Chief Complaint  ?Patient presents with  ? Diabetes  ?  Here for follow up  ? Hyperlipidemia  ?  Here for follow up  ? Hip Pain  ?  Complains of bilateral hip pain, started about one month ago ?  ? ? ?HPI ?Patient is in today for follow up . ? ?Reports bilateral hip pain.   ? ?DM2- maintained on glucotrol xl 20m and januvia.   ?Lab Results  ?Component Value Date  ? HGBA1C 7.8 (H) 07/08/2021  ? HGBA1C 7.3 (H) 04/06/2021  ? HGBA1C 7.4 (H) 01/05/2021  ? ?Lab Results  ?Component Value Date  ? MICROALBUR 2.8 (H) 04/06/2021  ? LHannah64 10/03/2020  ? CREATININE 1.46 (H) 07/08/2021  ? ?HTN-  ?BP Readings from Last 3 Encounters:  ?07/09/21 (!) 130/93  ?07/08/21 128/75  ?06/11/21 (!) 121/57  ? ? ? ?Health Maintenance Due  ?Topic Date Due  ? COVID-19 Vaccine (4 - Booster for [employer] series) 04/18/2020  ? OPHTHALMOLOGY EXAM  02/12/2021  ? ? ?Past Medical History:  ?Diagnosis Date  ? Asthma   ? 09-23-2017  per pt has not done inhaler since May 2018, no insurance (QVAR bid and Ventolin prn)  ? B12 deficiency 01/05/2021  ? Crohn disease ([facility]   ? Diastolic dysfunction   ? per echo [phone] grade 2 diastolic dysfunction , ef 60-65%  ? Esophagitis   ? GERD (gastroesophageal reflux disease)   ? Hemorrhoids   ? Hiatal hernia   ? Hyperlipidemia   ? stopped meds due to side effects  ? Hypertension   ? 09-23-2017  per pt has takes bp meds since May 2018 no insurance (losartan 10775mqd, HCTZ 12.75m475md, and toprol 50 qd)  ? Microalbuminuria 06/01/2006  ? Qualifier: Diagnosis of  By: PatPosey Pronto, Sejal    ? Type 2 diabetes mellitus ([facility] ? 09-23-2017 per pt has not taken diabetic meds since May 2018, no insurance (kombiglyze 5-1000m32m)  ? ? ?Past Surgical History:  ?Procedure Laterality Date  ? BREAST BIOPSY Bilateral   ? CARDIOVASCULAR STRESS TEST  07/29/2009  ? normal nuclear study w/ no ischemia/  normal LV function and wall  motion,  ef 70%  ? CESAREAN SECTION  1980s  ? RECTAL BIOPSY N/A 10/03/2017  ? Procedure: POSSIBLE BIOPSY RECTAL;  Surgeon: WhitIleana Roup;  Location: [facility];  Service: General;  Laterality: N/A;  ? ? ?Family History  ?Problem Relation Age of Onset  ? Hypertension Mother 70  6Stroke Mother 70  53Diabetes Maternal Aunt   ?     s/p bilater amputation due to DM  ? Asthma Father   ? Colon cancer Neg Hx   ? Esophageal cancer Neg Hx   ? Stomach cancer Neg Hx   ? Rectal cancer Neg Hx   ? ? ?Social History  ? ?Socioeconomic History  ? Marital status: Married  ?  Spouse name: Not on file  ? Number of children: 2  ? Years of education: Not on file  ? Highest education level: Not on file  ?Occupational History  ? Occupation: unemployed  ?Tobacco Use  ? Smoking status: Never  ? Smokeless tobacco: Never  ?Vaping Use  ? Vaping Use: Never used  ?Substance and Sexual Activity  ? Alcohol use: No  ? Drug use: No  ? Sexual activity:  Not Currently  ?Other Topics Concern  ? Not on file  ?Social History Narrative  ? Not working currently. Retired in 2019. Previously worked in a Proofreader job  ? Married   ? 2 children (son and daughter). They are both local  ? She has 1 grand-daughter and 1 grand-son  ? No pets (allergic)  ? Completed 4 years of college.  ? ?Social Determinants of Health  ? ?Financial Resource Strain: Not on file  ?Food Insecurity: Not on file  ?Transportation Needs: Not on file  ?Physical Activity: Not on file  ?Stress: Not on file  ?Social Connections: Not on file  ?Intimate Partner Violence: Not on file  ? ? ?Outpatient Medications Prior to Visit  ?Medication Sig Dispense Refill  ? albuterol (VENTOLIN HFA) 108 (90 Base) MCG/ACT inhaler Inhale 1 puff into the lungs every 6 (six) hours as needed for wheezing or shortness of breath. 18 g 5  ? amLODipine (NORVASC) 10 MG tablet Take 1 tablet by mouth once daily 90 tablet 0  ? clotrimazole-betamethasone (LOTRISONE) cream Apply 1 application topically 2 (two) times  daily. 30 g 1  ? colchicine 0.6 MG tablet Take 2 tabs by mouth now and 1 tab in 1 hour. 6 tablet 1  ? Continuous Blood Gluc Receiver ([place] G4 PLAT PED RCV/SHARE) DEVI Use as directed 1 each 0  ? cyanocobalamin (,VITAMIN B-12,) 1000 MCG/ML injection Inject 1 mL (1,000 mcg total) into the muscle every 30 (thirty) days. 3 mL 0  ? dicyclomine (BENTYL) 10 MG capsule TAKE 1 CAPSULE BY MOUTH THREE TIMES DAILY AS NEEDED FOR  CRAMPING 90 capsule 2  ? diphenoxylate-atropine (LOMOTIL) 2.5-0.025 MG tablet TAKE 1 TABLET BY MOUTH 4 TIMES DAILY AS NEEDED FOR DIARRHEA OR LOOSE STOOLS 120 tablet 1  ? glipiZIDE (GLUCOTROL XL) 10 MG 24 hr tablet Take 1 tablet by mouth once daily with breakfast 90 tablet 0  ? hydrochlorothiazide (MICROZIDE) 12.5 MG capsule Take 1 capsule by mouth once daily 90 capsule 1  ? hydrocortisone (ANUSOL-HC) 25 MG suppository Place 1 suppository (25 mg total) rectally at bedtime. 14 suppository 0  ? lubiprostone (AMITIZA) 8 MCG capsule Take 1 capsule (8 mcg total) by mouth 2 (two) times daily with a meal. 60 capsule 3  ? metoprolol succinate (TOPROL-XL) 50 MG 24 hr tablet Take 1 tablet by mouth once daily 90 tablet 1  ? Multiple Vitamins-Minerals (MULTIVITAMIN WITH MINERALS) tablet Take 1 tablet by mouth daily.    ? omeprazole (PRILOSEC) 40 MG capsule Take 1 capsule by mouth once daily 90 capsule 0  ? polyethylene glycol (MIRALAX / GLYCOLAX) 17 g packet Take 17 g by mouth as needed. 1 capful prn 30 each 11  ? simvastatin (ZOCOR) 10 MG tablet Take 1 tablet by mouth once daily 90 tablet 0  ? sitaGLIPtin (JANUVIA) 50 MG tablet Take 1 tablet (50 mg total) by mouth daily. 30 tablet 5  ? valsartan (DIOVAN) 40 MG tablet Take 1 tablet (40 mg total) by mouth daily. 30 tablet 3  ? vitamin B-12 (CYANOCOBALAMIN) 1000 MCG tablet Take 1 tablet (1,000 mcg total) by mouth daily. 1 tablet 0  ? azaTHIOprine (IMURAN) 50 MG tablet Take 2 tablets by mouth once daily 60 tablet 0  ? ?No facility-administered medications prior to  visit.  ? ? ?Allergies  ?Allergen Reactions  ? Lipitor [Atorvastatin Calcium]   ?  Leg cramps ?  ? Lisinopril Itching  ? Pravachol [Pravastatin Sodium]   ? Pravastatin Sodium Other (See Comments)  ?  leg cramps  ? ? ?ROS ?See HPI ?   ?Objective:  ?  ?Physical Exam ?Constitutional:   ?   General: She is not in acute distress. ?   Appearance: Normal appearance. She is well-developed.  ?HENT:  ?   Head: Normocephalic and atraumatic.  ?   Right Ear: External ear normal.  ?   Left Ear: External ear normal.  ?Eyes:  ?   General: No scleral icterus. ?Neck:  ?   Thyroid: No thyromegaly.  ?Cardiovascular:  ?   Rate and Rhythm: Normal rate and regular rhythm.  ?   Heart sounds: Normal heart sounds. No murmur heard. ?Pulmonary:  ?   Effort: Pulmonary effort is normal. No respiratory distress.  ?   Breath sounds: Normal breath sounds. No wheezing.  ?Musculoskeletal:  ?   Cervical back: Neck supple.  ?Skin: ?   General: Skin is warm and dry.  ?Neurological:  ?   Mental Status: She is alert and oriented to person, place, and time.  ?Psychiatric:     ?   Mood and Affect: Mood normal.     ?   Behavior: Behavior normal.     ?   Thought Content: Thought content normal.     ?   Judgment: Judgment normal.  ? ? ?BP 128/75 (BP Location: Left Arm, Patient Position: Sitting, Cuff Size: Large)   Pulse 63   Temp 98.2 ?F (36.8 ?C) (Oral)   Resp 16   Wt 213 lb (96.6 kg)   SpO2 100%   BMI 43.02 kg/m?  ?Wt Readings from Last 3 Encounters:  ?07/09/21 230 lb 3.2 oz (104.4 kg)  ?07/08/21 213 lb (96.6 kg)  ?06/11/21 213 lb 3.2 oz (96.7 kg)  ? ? ?   ?Assessment & Plan:  ? ?Problem List Items Addressed This Visit   ? ?  ? Unprioritized  ? Essential hypertension (Chronic)  ?  DBP mildly elevated.  Consider increase diovan next visit. She is hesitant to increase medication at this time.  ? ?BP Readings from Last 3 Encounters:  ?07/09/21 (!) 130/93  ?07/08/21 128/75  ?06/11/21 (!) 121/57  ? ? ?  ?  ? Hip pain  ?  Bilateral. 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 Trial of  tylenol.  ?  ?  ? Controlled type 2 diabetes mellitus without complication, without long-term current use of insulin ([place]) - Primary  ? Relevant Orders  ? Hemoglobin A1c (Completed)  ? Basic metabolic panel (Co

## 2021-07-09 ENCOUNTER — Other Ambulatory Visit: Payer: Self-pay | Admitting: Internal Medicine

## 2021-07-09 ENCOUNTER — Ambulatory Visit (INDEPENDENT_AMBULATORY_CARE_PROVIDER_SITE_OTHER): Payer: PPO

## 2021-07-09 ENCOUNTER — Other Ambulatory Visit: Payer: Self-pay

## 2021-07-09 ENCOUNTER — Telehealth: Payer: Self-pay | Admitting: Family

## 2021-07-09 VITALS — BP 110/57 | HR 63 | Temp 98.2°F | Resp 18 | Ht <= 58 in | Wt 230.2 lb

## 2021-07-09 DIAGNOSIS — K50111 Crohn's disease of large intestine with rectal bleeding: Secondary | ICD-10-CM | POA: Diagnosis not present

## 2021-07-09 DIAGNOSIS — Z5112 Encounter for antineoplastic immunotherapy: Secondary | ICD-10-CM

## 2021-07-09 DIAGNOSIS — E119 Type 2 diabetes mellitus without complications: Secondary | ICD-10-CM | POA: Insufficient documentation

## 2021-07-09 DIAGNOSIS — M25559 Pain in unspecified hip: Secondary | ICD-10-CM | POA: Insufficient documentation

## 2021-07-09 MED ORDER — DIPHENHYDRAMINE HCL 25 MG PO CAPS
25.0000 mg | ORAL_CAPSULE | Freq: Once | ORAL | Status: AC
  Administered 2021-07-09: 25 mg via ORAL
  Filled 2021-07-09: qty 1

## 2021-07-09 MED ORDER — ACETAMINOPHEN 325 MG PO TABS
650.0000 mg | ORAL_TABLET | Freq: Once | ORAL | Status: AC
  Administered 2021-07-09: 650 mg via ORAL
  Filled 2021-07-09: qty 2

## 2021-07-09 MED ORDER — PIOGLITAZONE HCL 30 MG PO TABS: 30.0000 mg | ORAL_TABLET | Freq: Every day | ORAL | 1 refills | Status: DC

## 2021-07-09 MED ORDER — SODIUM CHLORIDE 0.9 % IV SOLN
10.0000 mg/kg | Freq: Once | INTRAVENOUS | Status: AC
  Administered 2021-07-09: 1000 mg via INTRAVENOUS
  Filled 2021-07-09: qty 100

## 2021-07-09 MED ORDER — HYDROCORTISONE NA SUCCINATE PF 100 MG IJ SOLR
100.0000 mg | Freq: Once | INTRAMUSCULAR | Status: AC
  Administered 2021-07-09: 100 mg via INTRAVENOUS
  Filled 2021-07-09: qty 2

## 2021-07-09 NOTE — Assessment & Plan Note (Signed)
DBP mildly elevated.  Consider increase diovan next visit. She is hesitant to increase medication at this time.  ? ?BP Readings from Last 3 Encounters:  ?07/09/21 (!) 130/93  ?07/08/21 128/75  ?06/11/21 (!) 121/57  ? ? ?

## 2021-07-09 NOTE — Telephone Encounter (Signed)
Kidney function has worsened slightly.  I would like to refer her to nephrology for consultation.  ? ?Sugar control has worsened.  A1C is 7.8.  Our goal is <7.0.  I would like her to add actos once daily.   ?

## 2021-07-09 NOTE — Assessment & Plan Note (Signed)
Discussed importance of BP/Glycemic control. Continue ARB. Refer to nephrology for further evaluation.  ?

## 2021-07-09 NOTE — Assessment & Plan Note (Signed)
Bilateral. 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 Trial of tylenol.  ?

## 2021-07-09 NOTE — Progress Notes (Signed)
Diagnosis: Crohn's Disease ? ?Provider:  Marshell Garfinkel, MD ? ?Procedure: Infusion ? ?IV Type: Peripheral, IV Location: L Antecubital ? ?Remicade (Infliximab), Dose: 1000 mg ? ?Infusion Start Time: 7737 ? ?Infusion Stop Time: 3668 ? ?Post Infusion IV Care: Peripheral IV Discontinued ? ?Discharge: Condition: Good, Destination: Home . AVS provided to patient.  ? ?Performed by:  Cleophus Molt, RN  ?  ?

## 2021-07-09 NOTE — Telephone Encounter (Signed)
Patient advised of results, referral and new medication. She verbalized agreement with plan of care.  ?

## 2021-07-20 ENCOUNTER — Other Ambulatory Visit: Payer: Self-pay | Admitting: Family

## 2021-07-20 DIAGNOSIS — E119 Type 2 diabetes mellitus without complications: Secondary | ICD-10-CM

## 2021-08-11 ENCOUNTER — Ambulatory Visit: Payer: PPO | Admitting: Internal Medicine

## 2021-08-11 ENCOUNTER — Other Ambulatory Visit: Payer: Self-pay | Admitting: Internal Medicine

## 2021-08-11 ENCOUNTER — Encounter: Payer: Self-pay | Admitting: Internal Medicine

## 2021-08-11 ENCOUNTER — Other Ambulatory Visit: Payer: Self-pay | Admitting: Family

## 2021-08-11 ENCOUNTER — Other Ambulatory Visit (INDEPENDENT_AMBULATORY_CARE_PROVIDER_SITE_OTHER): Payer: PPO

## 2021-08-11 VITALS — BP 128/72 | HR 70 | Ht <= 58 in | Wt 214.0 lb

## 2021-08-11 DIAGNOSIS — K219 Gastro-esophageal reflux disease without esophagitis: Secondary | ICD-10-CM

## 2021-08-11 DIAGNOSIS — K50119 Crohn's disease of large intestine with unspecified complications: Secondary | ICD-10-CM

## 2021-08-11 DIAGNOSIS — K624 Stenosis of anus and rectum: Secondary | ICD-10-CM | POA: Diagnosis not present

## 2021-08-11 DIAGNOSIS — E538 Deficiency of other specified B group vitamins: Secondary | ICD-10-CM | POA: Diagnosis not present

## 2021-08-11 DIAGNOSIS — K5900 Constipation, unspecified: Secondary | ICD-10-CM

## 2021-08-11 LAB — COMPREHENSIVE METABOLIC PANEL
ALT: 9 U/L (ref 0–35)
AST: 11 U/L (ref 0–37)
Albumin: 4 g/dL (ref 3.5–5.2)
Alkaline Phosphatase: 106 U/L (ref 39–117)
BUN: 20 mg/dL (ref 6–23)
CO2: 31 mEq/L (ref 19–32)
Calcium: 10.1 mg/dL (ref 8.4–10.5)
Chloride: 100 mEq/L (ref 96–112)
Creatinine, Ser: 1.4 mg/dL — ABNORMAL HIGH (ref 0.40–1.20)
GFR: 38.78 mL/min — ABNORMAL LOW (ref >60.00)
Glucose, Bld: 187 mg/dL — ABNORMAL HIGH (ref 70–99)
Potassium: 4.2 mEq/L (ref 3.5–5.1)
Sodium: 138 mEq/L (ref 135–145)
Total Bilirubin: 0.4 mg/dL (ref 0.2–1.2)
Total Protein: 8.9 g/dL — ABNORMAL HIGH (ref 6.0–8.3)

## 2021-08-11 LAB — CBC WITH DIFFERENTIAL/PLATELET
Basophils Absolute: 0 10*3/uL (ref 0.0–0.1)
Basophils Relative: 0.6 % (ref 0.0–3.0)
Eosinophils Absolute: 0.1 10*3/uL (ref 0.0–0.7)
Eosinophils Relative: 1.3 % (ref 0.0–5.0)
HCT: 36 % (ref 36.0–46.0)
Hemoglobin: 11.2 g/dL — ABNORMAL LOW (ref 12.0–15.0)
Lymphocytes Relative: 11 % — ABNORMAL LOW (ref 12.0–46.0)
Lymphs Abs: 0.6 10*3/uL — ABNORMAL LOW (ref 0.7–4.0)
MCHC: 31.1 g/dL (ref 30.0–36.0)
MCV: 69 fl — ABNORMAL LOW (ref 78.0–100.0)
Monocytes Absolute: 0.5 10*3/uL (ref 0.1–1.0)
Monocytes Relative: 9.4 % (ref 3.0–12.0)
Neutro Abs: 4.3 10*3/uL (ref 1.4–7.7)
Neutrophils Relative %: 77.7 % — ABNORMAL HIGH (ref 43.0–77.0)
Platelets: 333 10*3/uL (ref 150.0–400.0)
RBC: 5.21 Mil/uL — ABNORMAL HIGH (ref 3.87–5.11)
RDW: 17.6 % — ABNORMAL HIGH (ref 11.5–15.5)
WBC: 5.5 10*3/uL (ref 4.0–10.5)

## 2021-08-11 NOTE — Progress Notes (Signed)
? ?Subjective:  ? ? Patient ID: Kathy [name], female    DOB: 1956-01-22, 66 y.o.   MRN: [id number] ? ?HPI ?Kathy [name] is a 66 year old female with a history of Crohn's disease of the colon and anorectum with prior fistula on infliximab (started April 2022), hypertension, diabetes, hyperlipidemia and asthma who is here for follow-up. ? ?Since her last visit she has been reestablished on Remicade with reinduction on February 2 and 16 and on March 16.  She has been getting 10 mg/kg with plans for every 6 weeks therapy with next dose 08/20/2021. ? ?She reports that symptoms overall are better.  Her social function is still limited over fear of bowel movements and borborygmi.  She has been titrating Amitiza 8 mcg but usually only using it once daily as twice daily can lead to diarrhea and loose stool.  She has increased gas and bloating.  She has had less rectal bleeding but still some intermittent blood with straining.  Stools at times are hard to evacuate and feel narrow.  The skin of her intergluteal cleft has healed.  She is not having upper GI symptoms or hepatobiliary complaint.  She has continued her azathioprine. ? ? ?Review of Systems ?As per HPI, otherwise negative ? ?Current Medications, Allergies, Past Medical History, Past Surgical History, Family History and Social History were reviewed in [employer] record. ? ?   ?Objective:  ? Physical Exam ?BP 128/72   Pulse 70   Ht 4' 10"  (1.473 m)   Wt 214 lb (97.1 kg)   BMI 44.73 kg/m?  ?Gen: awake, alert, NAD ?HEENT: anicteric  ?CV: RRR, no mrg ?Pulm: CTA b/l ?Abd: soft, NT/ND, +BS throughout ?Ext: no c/c/e ?Neuro: nonfocal ? ? ?  Latest Ref Rng & Units 08/11/2021  ? 12:12 PM 10/23/2020  ? 11:31 AM 02/04/2020  ? 11:14 AM  ?CBC  ?WBC 4.0 - 10.5 K/uL 5.5   5.5   9.1    ?Hemoglobin 12.0 - 15.0 g/dL 11.2   11.3   10.7    ?Hematocrit 36.0 - 46.0 % 36.0   35.9   34.1    ?Platelets 150.0 - 400.0 K/uL 333.0   337.0   438    ? ?CMP  ?    ?Component Value Date/Time  ? NA 138 08/11/2021 1212  ? NA 139 10/11/2018 1009  ? K 4.2 08/11/2021 1212  ? CL 100 08/11/2021 1212  ? CO2 31 08/11/2021 1212  ? GLUCOSE 187 (H) 08/11/2021 1212  ? BUN 20 08/11/2021 1212  ? BUN 14 10/11/2018 1009  ? CREATININE 1.40 (H) 08/11/2021 1212  ? CREATININE 1.49 (H) 02/04/2020 1114  ? CALCIUM 10.1 08/11/2021 1212  ? PROT 8.9 (H) 08/11/2021 1212  ? PROT 7.4 10/11/2018 1009  ? ALBUMIN 4.0 08/11/2021 1212  ? ALBUMIN 3.5 (L) 10/11/2018 1009  ? AST 11 08/11/2021 1212  ? ALT 9 08/11/2021 1212  ? ALKPHOS 106 08/11/2021 1212  ? BILITOT 0.4 08/11/2021 1212  ? BILITOT 0.3 10/11/2018 1009  ? GFRNONAA 42 (L) 10/11/2018 1009  ? GFRAA 48 (L) 10/11/2018 1009  ? ? ? ? ?   ?Assessment & Plan:  ?Kathy [name] is a 66 year old female with a history of Crohn's disease of the colon and anorectum with prior fistula on infliximab (started April 2022), hypertension, diabetes, hyperlipidemia and asthma who is here for follow-up. ? ?Crohn's colitis with perianal involvement and history of fistula/anal stenosis --she has been reinitiated on infliximab/Remicade at 10 mg/kg with plans  to continue every 6 weeks.  She is continued azathioprine.  Some of her symptoms with bowel movement are likely related to anal stenosis secondary to Crohn's disease.  I recommended flexible sigmoidoscopy to evaluate proctitis as well as perform manual dilation under anesthesia. ?--Continue infliximab 10 mg/kg every 6 weeks with next infusion 08/20/2021 ?--Infliximab levels today ?--CBC and CMP as above ?--Continue azathioprine 100 mg daily ?--Flexible sigmoidoscopy with manual dilation of anal stenosis and LEC with propofol sedation ? ?2.  Constipation --exacerbated by anal stenosis ?--See #1 ?--Continue Amitiza 8 mcg once to twice daily ? ?3.  Abdominal gas and bloating --likely in part related to #2.  Can try over-the-counter Beano or Gas-X ? ?4.  GERD with history of esophagitis --continue omeprazole 40 mg  daily ? ?5.  B12 deficiency --continuing B12 replacement ? ?30 minutes total spent today including patient facing time, coordination of care, reviewing medical history/procedures/pertinent radiology studies, and documentation of the encounter. ? ? ?

## 2021-08-11 NOTE — Patient Instructions (Addendum)
You have been scheduled for a flexible sigmoidoscopy. Please follow the written instructions given to you at your visit today. ?If you use inhalers (even only as needed), please bring them with you on the day of your procedure. ? ?Please purchase the following medications over the counter and take as directed: ?Gas-X OR Beano ? ?Your provider has requested that you go to the basement level for lab work before leaving today. Press "B" on the elevator. The lab is located at the first door on the left as you exit the elevator. ? ?If you are age 27 or older, your body mass index should be between 23-30. Your Body mass index is 44.73 kg/m?Marland Kitchen If this is out of the aforementioned range listed, please consider follow up with your Primary Care Provider. ? ?If you are age 43 or younger, your body mass index should be between 19-25. Your Body mass index is 44.73 kg/m?Marland Kitchen If this is out of the aformentioned range listed, please consider follow up with your Primary Care Provider.  ? ?________________________________________________________ ? ?The [facility] providers would like to encourage you to use [facility] to communicate with providers for non-urgent requests or questions.  Due to long hold times on the telephone, sending your provider a message by [facility] may be a faster and more efficient way to get a response.  Please allow 48 business hours for a response.  Please remember that this is for non-urgent requests.  ?_______________________________________________________ ? ?Due to recent changes in healthcare laws, you may see the results of your imaging and laboratory studies on MyChart before your provider has had a chance to review them.  We understand that in some cases there may be results that are confusing or concerning to you. Not all laboratory results come back in the same time frame and the provider may be waiting for multiple results in order to interpret others.  Please give [place] 48 hours in order for your provider to  thoroughly review all the results before contacting the office for clarification of your results.  ? ?

## 2021-08-16 LAB — SERIAL MONITORING

## 2021-08-17 LAB — INFLIXIMAB+AB (SERIAL MONITOR)
Anti-Infliximab Antibody: <22 ng/mL
Infliximab Drug Level: 17 ug/mL

## 2021-08-20 ENCOUNTER — Ambulatory Visit (INDEPENDENT_AMBULATORY_CARE_PROVIDER_SITE_OTHER): Payer: PPO

## 2021-08-20 VITALS — BP 119/72 | HR 60 | Temp 97.7°F | Resp 16 | Ht <= 58 in | Wt 215.4 lb

## 2021-08-20 DIAGNOSIS — K50111 Crohn's disease of large intestine with rectal bleeding: Secondary | ICD-10-CM | POA: Diagnosis not present

## 2021-08-20 MED ORDER — HYDROCORTISONE NA SUCCINATE PF 100 MG IJ SOLR
100.0000 mg | Freq: Once | INTRAMUSCULAR | Status: AC
  Administered 2021-08-20: 100 mg via INTRAVENOUS
  Filled 2021-08-20: qty 2

## 2021-08-20 MED ORDER — ACETAMINOPHEN 325 MG PO TABS
650.0000 mg | ORAL_TABLET | Freq: Once | ORAL | Status: AC
  Administered 2021-08-20: 650 mg via ORAL
  Filled 2021-08-20: qty 2

## 2021-08-20 MED ORDER — SODIUM CHLORIDE 0.9 % IV SOLN
10.0000 mg/kg | Freq: Once | INTRAVENOUS | Status: AC
  Administered 2021-08-20: 1000 mg via INTRAVENOUS
  Filled 2021-08-20: qty 100

## 2021-08-20 MED ORDER — DIPHENHYDRAMINE HCL 25 MG PO CAPS
25.0000 mg | ORAL_CAPSULE | Freq: Once | ORAL | Status: AC
  Administered 2021-08-20: 25 mg via ORAL
  Filled 2021-08-20: qty 1

## 2021-08-20 NOTE — Progress Notes (Signed)
Diagnosis: Crohn's Disease ? ?Provider:  Marshell Garfinkel, MD ? ?Procedure: Infusion ? ?IV Type: Peripheral, IV Location: L Antecubital ? ?Remicade (Infliximab), Dose: 1000 mg ? ?Infusion Start Time: 1610 ? ?Infusion Stop Time: 9604 ? ?Post Infusion IV Care: Observation period completed ? ?Discharge: Condition: Good, Destination: Home . AVS provided to patient.  ? ?Performed by:  Paul Dykes, RN  ?  ?

## 2021-08-26 ENCOUNTER — Other Ambulatory Visit: Payer: Self-pay | Admitting: Family

## 2021-08-26 DIAGNOSIS — I119 Hypertensive heart disease without heart failure: Secondary | ICD-10-CM

## 2021-09-02 ENCOUNTER — Other Ambulatory Visit: Payer: Self-pay | Admitting: Family

## 2021-09-10 ENCOUNTER — Encounter: Payer: Self-pay | Admitting: Internal Medicine

## 2021-09-10 ENCOUNTER — Telehealth: Payer: Self-pay

## 2021-09-10 ENCOUNTER — Ambulatory Visit (AMBULATORY_SURGERY_CENTER): Payer: PPO | Admitting: Internal Medicine

## 2021-09-10 VITALS — BP 124/50 | HR 69 | Temp 98.0°F | Resp 10 | Ht <= 58 in | Wt 214.0 lb

## 2021-09-10 DIAGNOSIS — K6289 Other specified diseases of anus and rectum: Secondary | ICD-10-CM | POA: Diagnosis not present

## 2021-09-10 DIAGNOSIS — K626 Ulcer of anus and rectum: Secondary | ICD-10-CM | POA: Diagnosis not present

## 2021-09-10 DIAGNOSIS — I10 Essential (primary) hypertension: Secondary | ICD-10-CM | POA: Diagnosis not present

## 2021-09-10 DIAGNOSIS — J45909 Unspecified asthma, uncomplicated: Secondary | ICD-10-CM | POA: Diagnosis not present

## 2021-09-10 DIAGNOSIS — K50119 Crohn's disease of large intestine with unspecified complications: Secondary | ICD-10-CM | POA: Diagnosis not present

## 2021-09-10 DIAGNOSIS — E119 Type 2 diabetes mellitus without complications: Secondary | ICD-10-CM | POA: Diagnosis not present

## 2021-09-10 MED ORDER — SODIUM CHLORIDE 0.9 % IV SOLN: 500.0000 mL | Freq: Once | INTRAVENOUS | Status: DC

## 2021-09-10 NOTE — Patient Instructions (Signed)
YOU HAD AN ENDOSCOPIC PROCEDURE TODAY AT [facility]:   Refer to the procedure report that was given to you for any specific questions about what was found during the examination.  If the procedure report does not answer your questions, please call your gastroenterologist to clarify.  If you requested that your care partner not be given the details of your procedure findings, then the procedure report has been included in a sealed envelope for you to review at your convenience later.  YOU SHOULD EXPECT: Some feelings of bloating in the abdomen. Passage of more gas than usual.  Walking can help get rid of the air that was put into your GI tract during the procedure and reduce the bloating. If you had a lower endoscopy (such as a colonoscopy or flexible sigmoidoscopy) you may notice spotting of blood in your stool or on the toilet paper. If you underwent a bowel prep for your procedure, you may not have a normal bowel movement for a few days.  Please Note:  You might notice some irritation and congestion in your nose or some drainage.  This is from the oxygen used during your procedure.  There is no need for concern and it should clear up in a day or so.  SYMPTOMS TO REPORT IMMEDIATELY:  Following lower endoscopy (Flexible sigmoidoscopy):  Excessive amounts of blood in the stool  Significant tenderness or worsening of abdominal pains  Swelling of the abdomen that is new, acute  Fever of 100F or higher  For urgent or emergent issues, a gastroenterologist can be reached at any hour by calling [phone]. Do not use MyChart messaging for urgent concerns.    DIET:  We do recommend a small meal at first, but then you may proceed to your regular diet.  Drink plenty of fluids but you should avoid alcoholic beverages for 24 hours.  ACTIVITY:  You should plan to take it easy for the rest of today and you should NOT DRIVE or use heavy machinery until tomorrow (because of the sedation  medicines used during the test).    FOLLOW UP: Our staff will call the number listed on your records TOMORROW to check on you and address any questions or concerns that you may have regarding the information given to you following your procedure. If we do not reach you, we will leave a message.  We will attempt to reach you two times.  During this call, we will ask if you have developed any symptoms of COVID 19. If you develop any symptoms (ie: fever, flu-like symptoms, shortness of breath, cough etc.) before then, please call [phone].  If you test positive for Covid 19 in the 2 weeks post procedure, please call and report this information to [place].    If any biopsies were taken you will be contacted by phone or by letter within the next 1-3 weeks.  Please call us at [phone] if you have not heard about the biopsies in 3 weeks.    SIGNATURES/CONFIDENTIALITY: You and/or your care partner have signed paperwork which will be entered into your electronic medical record.  These signatures attest to the fact that that the information above on your After Visit Summary has been reviewed and is understood.  Full responsibility of the confidentiality of this discharge information lies with you and/or your care-partner.

## 2021-09-10 NOTE — Op Note (Signed)
[place] Patient Name: Kathy [name] Procedure Date: 09/10/2021 2:34 PM MRN: [id number] Endoscopist: Jerene Bears , MD Age: 66 Referring MD:  Date of Birth: 1955/06/22 Gender: Female Account #: [id number] Procedure:                Flexible Sigmoidoscopy Indications:              Personal history of perianal and colonic Crohn's                            disease (dx 2019); currently on infliximab since                            April 2022 (Remicade 10 mg/kg and AZA); assess                            disease activity, suspicion of anal stenosis; last                            colonoscopy Jan 2022 Medicines:                Monitored Anesthesia Care Procedure:                Pre-Anesthesia Assessment:                           - Prior to the procedure, a History and Physical                            was performed, and patient medications and                            allergies were reviewed. The patient's tolerance of                            previous anesthesia was also reviewed. The risks                            and benefits of the procedure and the sedation                            options and risks were discussed with the patient.                            All questions were answered, and informed consent                            was obtained. Prior Anticoagulants: The patient has                            taken no previous anticoagulant or antiplatelet                            agents. ASA Grade Assessment: III - A patient with  severe systemic disease. After reviewing the risks                            and benefits, the patient was deemed in                            satisfactory condition to undergo the procedure.                           After obtaining informed consent, the scope was                            passed under direct vision. The Olympus PCF-H190DL                            ([id number]) Colonoscope  was introduced through the                            anus and advanced to the sigmoid colon. The                            flexible sigmoidoscopy was accomplished without                            difficulty. The patient tolerated the procedure                            well. The quality of the bowel preparation was                            adequate. Scope In: 2:53:32 PM Scope Out: 3:01:25 PM Total Procedure Duration: 0 hours 7 minutes 53 seconds  Findings:                 The perianal skin examination was improved with                            resolution/healing of skin breakdown of                            intragluteal cleft. Mild perianal swelling at anus.                           The digital rectal exam findings include moderate                            to severe anal stricture. This was manually dilated                            and which then allowed passage of the pediatric                            colonoscope.  Patchy mild inflammation characterized by erosions                            and friability was found in the distal sigmoid                            colon. Overall this is much improved compared to                            Jan 2022.                           Inflammation characterized by altered vascularity,                            congestion (edema), friability, granularity, mucus                            and scarring was found in the distal rectum/anal                            canal. This was moderate in severity, and when                            compared to previous examinations, the findings are                            improved (ulcerations have healed). Biopsies were                            taken with a cold forceps for histology. Complications:            No immediate complications. Estimated Blood Loss:     Estimated blood loss was minimal. Impression:               - Anal stricture found on digital  rectal exam.                            Manual dilation and with pediatric endoscope.                           - Patchy mild inflammation was found in the distal                            sigmoid colon secondary to Crohn's disease with                            colonic involvement. Improved compared to last exam.                           - Moderate proctitis in the distal rectum and anal                            canal, though ulcerations have healed. Considerable  scarring and leading to stenosis as above. Crohn's                            disease with colonic involvement. Biopsied. Recommendation:           - Patient has a contact number available for                            emergencies. The signs and symptoms of potential                            delayed complications were discussed with the                            patient. Return to normal activities tomorrow.                            Written discharge instructions were provided to the                            patient.                           - Resume previous diet.                           - Continue present medications. Continue Remicade                            and azathioprine as this combination is definitive                            working better than any previous Crohn's treatment.                           - Await pathology results.                           - Follow-up appointment with Dr. Annye English for                            opinion on anal stenosis management (initially saw                            patient in 2019 at diagnosis). Jerene Bears, MD 09/10/2021 3:21:06 PM This report has been signed electronically.

## 2021-09-10 NOTE — Progress Notes (Signed)
Report to PACU, RN, vss, BBS= Clear.  

## 2021-09-10 NOTE — Progress Notes (Signed)
See office note dated 08/11/2021 for H&P and details Patient presenting for flexible sigmoidoscopy with propofol sedation.  She remains appropriate for this exam today in the [facility].

## 2021-09-10 NOTE — Progress Notes (Signed)
Called to room to assist during endoscopic procedure.  Patient ID and intended procedure confirmed with present staff. Received instructions for my participation in the procedure from the performing physician.  

## 2021-09-10 NOTE — Telephone Encounter (Signed)
Referral faxed to [facility] for pt to see Dr. Annye English.

## 2021-09-10 NOTE — Progress Notes (Signed)
I have reviewed the patient's medical history in detail and updated the computerized patient record.

## 2021-09-10 NOTE — Telephone Encounter (Signed)
-----   Message from Jerene Bears, MD sent at 09/10/2021  4:21 PM EDT ----- [name], patient will need to be referred back to see Annye English for anal stenosis and perianal Crohn's disease.  You will see this in today's flexible sigmoidoscopy report  Dottie, she will need to come back to see me in clinic in 8 to 10 weeks-ISH  Thanks KentJMP

## 2021-09-11 ENCOUNTER — Telehealth: Payer: Self-pay

## 2021-09-11 NOTE — Telephone Encounter (Signed)
  Follow up Call-     09/10/2021    2:27 PM 05/22/2020    1:01 PM 02/15/2020   12:57 PM  Call back number  Post procedure Call Back phone  # [phone]  Permission to leave phone message Yes Yes Yes     Patient questions:  Do you have a fever, pain , or abdominal swelling? No. Pain Score  0 *  Have you tolerated food without any problems? Yes.    Have you been able to return to your normal activities? Yes.    Do you have any questions about your discharge instructions: Diet   No. Medications  No. Follow up visit  No.  Do you have questions or concerns about your Care? No.  Actions: * If pain score is 4 or above: No action needed, pain <4.

## 2021-09-17 ENCOUNTER — Encounter: Payer: Self-pay | Admitting: Internal Medicine

## 2021-09-18 ENCOUNTER — Other Ambulatory Visit: Payer: Self-pay | Admitting: Family

## 2021-09-18 ENCOUNTER — Encounter: Payer: Self-pay | Admitting: Internal Medicine

## 2021-09-28 ENCOUNTER — Other Ambulatory Visit: Payer: Self-pay | Admitting: Internal Medicine

## 2021-10-01 ENCOUNTER — Ambulatory Visit (INDEPENDENT_AMBULATORY_CARE_PROVIDER_SITE_OTHER): Payer: PPO

## 2021-10-01 VITALS — BP 130/75 | HR 62 | Temp 98.4°F | Resp 16 | Ht <= 58 in | Wt 215.6 lb

## 2021-10-01 DIAGNOSIS — K50111 Crohn's disease of large intestine with rectal bleeding: Secondary | ICD-10-CM

## 2021-10-01 DIAGNOSIS — Z5112 Encounter for antineoplastic immunotherapy: Secondary | ICD-10-CM | POA: Diagnosis not present

## 2021-10-01 MED ORDER — DIPHENHYDRAMINE HCL 25 MG PO CAPS
25.0000 mg | ORAL_CAPSULE | Freq: Once | ORAL | Status: AC
  Administered 2021-10-01: 25 mg via ORAL
  Filled 2021-10-01: qty 1

## 2021-10-01 MED ORDER — METHYLPREDNISOLONE SODIUM SUCC 40 MG IJ SOLR
40.0000 mg | Freq: Once | INTRAMUSCULAR | Status: AC
  Administered 2021-10-01: 40 mg via INTRAVENOUS
  Filled 2021-10-01: qty 1

## 2021-10-01 MED ORDER — ACETAMINOPHEN 325 MG PO TABS
650.0000 mg | ORAL_TABLET | Freq: Once | ORAL | Status: AC
  Administered 2021-10-01: 650 mg via ORAL
  Filled 2021-10-01: qty 2

## 2021-10-01 MED ORDER — SODIUM CHLORIDE 0.9 % IV SOLN
10.0000 mg/kg | Freq: Once | INTRAVENOUS | Status: AC
  Administered 2021-10-01: 1000 mg via INTRAVENOUS
  Filled 2021-10-01: qty 100

## 2021-10-01 NOTE — Progress Notes (Signed)
Diagnosis: Crohn's Disease  Provider:  Marshell Garfinkel, MD  Procedure: Infusion  IV Type: Peripheral, IV Location: L Antecubital  Remicade (Infliximab), Dose: 1000 mg  Infusion Start Time: 1954  Infusion Stop Time: 1350  Post Infusion IV Care: Peripheral IV Discontinued  Discharge: Condition: Good, Destination: Home . AVS provided to patient.   Performed by:  Adelina Mings, LPN

## 2021-10-05 ENCOUNTER — Telehealth: Payer: Self-pay | Admitting: Family

## 2021-10-05 DIAGNOSIS — R928 Other abnormal and inconclusive findings on diagnostic imaging of breast: Secondary | ICD-10-CM

## 2021-10-05 NOTE — Telephone Encounter (Signed)
Please contact pt and advise her that it looks like she did not complete the recommended diagnostic breast imaging to look at an area of concern last year.  I am replacing this order.  Please give her the number to the breast center to call.

## 2021-10-05 NOTE — Telephone Encounter (Signed)
Called patient but no answer and no voice mail

## 2021-10-07 NOTE — Telephone Encounter (Signed)
Patient advised a new order was entered for the mammogram and phone number to radiology given to patient to call for appointment.

## 2021-10-14 DIAGNOSIS — K50119 Crohn's disease of large intestine with unspecified complications: Secondary | ICD-10-CM | POA: Diagnosis not present

## 2021-10-18 ENCOUNTER — Other Ambulatory Visit: Payer: Self-pay | Admitting: Family

## 2021-10-18 DIAGNOSIS — E119 Type 2 diabetes mellitus without complications: Secondary | ICD-10-CM

## 2021-10-19 ENCOUNTER — Other Ambulatory Visit: Payer: Self-pay | Admitting: Family

## 2021-10-23 ENCOUNTER — Encounter: Payer: Self-pay | Admitting: *Deleted

## 2021-10-26 ENCOUNTER — Other Ambulatory Visit: Payer: Self-pay | Admitting: Internal Medicine

## 2021-10-28 ENCOUNTER — Ambulatory Visit (INDEPENDENT_AMBULATORY_CARE_PROVIDER_SITE_OTHER): Payer: PPO | Admitting: Family

## 2021-10-28 ENCOUNTER — Encounter: Payer: Self-pay | Admitting: Family

## 2021-10-28 VITALS — BP 128/57 | HR 70 | Temp 98.1°F | Resp 16 | Wt 214.0 lb

## 2021-10-28 DIAGNOSIS — K2100 Gastro-esophageal reflux disease with esophagitis, without bleeding: Secondary | ICD-10-CM | POA: Diagnosis not present

## 2021-10-28 DIAGNOSIS — E538 Deficiency of other specified B group vitamins: Secondary | ICD-10-CM

## 2021-10-28 DIAGNOSIS — M5416 Radiculopathy, lumbar region: Secondary | ICD-10-CM

## 2021-10-28 DIAGNOSIS — I10 Essential (primary) hypertension: Secondary | ICD-10-CM | POA: Diagnosis not present

## 2021-10-28 DIAGNOSIS — E119 Type 2 diabetes mellitus without complications: Secondary | ICD-10-CM

## 2021-10-28 DIAGNOSIS — G43009 Migraine without aura, not intractable, without status migrainosus: Secondary | ICD-10-CM

## 2021-10-28 DIAGNOSIS — E7800 Pure hypercholesterolemia, unspecified: Secondary | ICD-10-CM

## 2021-10-28 LAB — LIPID PANEL
Cholesterol: 127 mg/dL (ref 0–200)
HDL: 35.8 mg/dL — ABNORMAL LOW (ref >39.00)
LDL Cholesterol: 69 mg/dL (ref 0–99)
NonHDL: 91.47
Total CHOL/HDL Ratio: 4
Triglycerides: 112 mg/dL (ref 0.0–149.0)
VLDL: 22.4 mg/dL (ref 0.0–40.0)

## 2021-10-28 LAB — BASIC METABOLIC PANEL
BUN: 20 mg/dL (ref 6–23)
CO2: 28 mEq/L (ref 19–32)
Calcium: 9.6 mg/dL (ref 8.4–10.5)
Chloride: 98 mEq/L (ref 96–112)
Creatinine, Ser: 1.42 mg/dL — ABNORMAL HIGH (ref 0.40–1.20)
GFR: 38.07 mL/min — ABNORMAL LOW (ref >60.00)
Glucose, Bld: 175 mg/dL — ABNORMAL HIGH (ref 70–99)
Potassium: 4.2 mEq/L (ref 3.5–5.1)
Sodium: 138 mEq/L (ref 135–145)

## 2021-10-28 LAB — HEMOGLOBIN A1C: Hgb A1c MFr Bld: 7.7 % — ABNORMAL HIGH (ref 4.6–6.5)

## 2021-10-28 MED ORDER — SIMVASTATIN 10 MG PO TABS: 10.0000 mg | ORAL_TABLET | Freq: Every day | ORAL | 1 refills | Status: DC

## 2021-10-28 NOTE — Assessment & Plan Note (Signed)
Lab Results  Component Value Date   CHOL 121 10/03/2020   HDL 35.60 (L) 10/03/2020   LDLCALC 64 10/03/2020   TRIG 106.0 10/03/2020   CHOLHDL 3 10/03/2020   Continues simvastatin.

## 2021-10-28 NOTE — Progress Notes (Signed)
Subjective:     Patient ID: Kathy [name], female    DOB: June 25, 1955, 66 y.o.   MRN: [id number]  Chief Complaint  Patient presents with   Diabetes    Here for follow up   Hyperlipidemia    Here for follow up   Leg Pain    Patient complains of pain on right leg    HPI Patient is in today for follow up.  Lumbar radiculopathy- reports pain radiating down the front of both legs and the back of the right leg. No improvement with tylenol.   DM2- maintained on glipizide, actos and [place]. Checks sugars about 3x weekly.   Lab Results  Component Value Date   HGBA1C 7.8 (H) 07/08/2021   HGBA1C 7.3 (H) 04/06/2021   HGBA1C 7.4 (H) 01/05/2021   Lab Results  Component Value Date   MICROALBUR 2.8 (H) 04/06/2021   LDLCALC 64 10/03/2020   CREATININE 1.40 (H) 08/11/2021     Health Maintenance Due  Topic Date Due   COVID-19 Vaccine (4 - Booster for [place] series) 04/18/2020   OPHTHALMOLOGY EXAM  02/12/2021   COLONOSCOPY (Pts 45-73yr Insurance coverage will need to be confirmed)  05/22/2021   FOOT EXAM  10/03/2021    Past Medical History:  Diagnosis Date   Anal stenosis    Asthma    09-23-2017  per pt has not done inhaler since May 2018, no insurance (QVAR bid and Ventolin prn)   B12 deficiency 01/05/2021   Crohn disease ([facility]    Diastolic dysfunction    per echo [phone] grade 2 diastolic dysfunction , ef 60-65%   Esophagitis    GERD (gastroesophageal reflux disease)    Hemorrhoids    Hiatal hernia    Hyperlipidemia    stopped meds due to side effects   Hypertension    09-23-2017  per pt has takes bp meds since May 2018 no insurance (losartan 10664mqd, HCTZ 12.64m64md, and toprol 50 qd)   Microalbuminuria 06/01/2006   Qualifier: Diagnosis of  By: PatPosey Pronto, Sejal     Type 2 diabetes mellitus ([facility]  09-23-2017 per pt has not taken diabetic meds since May 2018, no insurance (kombiglyze [id number])    Past Surgical History:  Procedure Laterality Date    BREAST BIOPSY Bilateral    CARDIOVASCULAR STRESS TEST  07/29/2009   normal nuclear study w/ no ischemia/  normal LV function and wall motion,  ef 70%   CESAREAN SECTION  1980s   RECTAL BIOPSY N/A 10/03/2017   Procedure: POSSIBLE BIOPSY RECTAL;  Surgeon: WhitIleana Roup;  Location: [facility];  Service: General;  Laterality: N/A;    Family History  Problem Relation Age of Onset   Hypertension Mother 70  72troke Mother 70  22iabetes Maternal Aunt        s/p bilater amputation due to DM   Asthma Father    Colon cancer Neg Hx    Esophageal cancer Neg Hx    Stomach cancer Neg Hx    Rectal cancer Neg Hx     Social History   Socioeconomic History   Marital status: Married    Spouse name: Not on file   Number of children: 2   Years of education: Not on file   Highest education level: Not on file  Occupational History   Occupation: unemployed  Tobacco Use   Smoking status: Never   Smokeless tobacco: Never  Vaping Use   Vaping Use: Never  used  Substance and Sexual Activity   Alcohol use: No   Drug use: No   Sexual activity: Not Currently  Other Topics Concern   Not on file  Social History Narrative   Not working currently. Retired in 2019. Previously worked in [employer] job   Married    2 children (son and daughter). They are both local   She has 1 grand-daughter and 1 grand-son   No pets (allergic)   Completed 4 years of college.   Social Determinants of Health   Financial Resource Strain: Not on file  Food Insecurity: Not on file  Transportation Needs: Not on file  Physical Activity: Not on file  Stress: Not on file  Social Connections: Not on file  Intimate Partner Violence: Not on file    Outpatient Medications Prior to Visit  Medication Sig Dispense Refill   albuterol (VENTOLIN HFA) 108 (90 Base) MCG/ACT inhaler Inhale 1 puff into the lungs every 6 (six) hours as needed for wheezing or shortness of breath. 18 g 5   amLODipine (NORVASC) 10 MG tablet  Take 1 tablet by mouth once daily 90 tablet 0   azaTHIOprine (IMURAN) 50 MG tablet Take 2 tablets by mouth once daily 60 tablet 2   clotrimazole-betamethasone (LOTRISONE) cream Apply 1 application topically 2 (two) times daily. 30 g 1   colchicine 0.6 MG tablet Take 2 tabs by mouth now and 1 tab in 1 hour. 6 tablet 1   Continuous Blood Gluc Receiver (Gustavus G4 PLAT PED RCV/SHARE) DEVI Use as directed 1 each 0   cyanocobalamin (,VITAMIN B-12,) 1000 MCG/ML injection Inject 1 mL (1,000 mcg total) into the muscle every 30 (thirty) days. 3 mL 0   dicyclomine (BENTYL) 10 MG capsule TAKE 1 CAPSULE BY MOUTH THREE TIMES DAILY AS NEEDED FOR  CRAMPING 90 capsule 2   diphenoxylate-atropine (LOMOTIL) 2.5-0.025 MG tablet TAKE 1 TABLET BY MOUTH 4 TIMES DAILY AS NEEDED FOR DIARRHEA OR LOOSE STOOLS 120 tablet 1   glipiZIDE (GLUCOTROL XL) 10 MG 24 hr tablet Take 1 tablet by mouth once daily with breakfast 90 tablet 0   hydrochlorothiazide (MICROZIDE) 12.5 MG capsule Take 1 capsule by mouth once daily 90 capsule 1   hydrocortisone (ANUSOL-HC) 25 MG suppository Place 1 suppository (25 mg total) rectally at bedtime. 14 suppository 0   JANUVIA 50 MG tablet Take 1 tablet by mouth once daily 30 tablet 5   lubiprostone (AMITIZA) 8 MCG capsule TAKE 1 CAPSULE BY MOUTH TWICE DAILY WITH A MEAL 60 capsule 2   metoprolol succinate (TOPROL-XL) 50 MG 24 hr tablet Take 1 tablet by mouth once daily 90 tablet 1   Multiple Vitamins-Minerals (MULTIVITAMIN WITH MINERALS) tablet Take 1 tablet by mouth daily.     omeprazole (PRILOSEC) 40 MG capsule Take 1 capsule by mouth once daily 90 capsule 0   pioglitazone (ACTOS) 30 MG tablet Take 1 tablet (30 mg total) by mouth daily. 90 tablet 1   polyethylene glycol (MIRALAX / GLYCOLAX) 17 g packet Take 17 g by mouth as needed. 1 capful prn 30 each 11   valsartan (DIOVAN) 40 MG tablet Take 1 tablet by mouth once daily 90 tablet 1   vitamin B-12 (CYANOCOBALAMIN) 1000 MCG tablet Take 1 tablet  (1,000 mcg total) by mouth daily. 1 tablet 0   simvastatin (ZOCOR) 10 MG tablet Take 1 tablet by mouth once daily 30 tablet 0   No facility-administered medications prior to visit.    Allergies  Allergen Reactions  Lipitor [Atorvastatin Calcium]     Leg cramps    Lisinopril Itching   Pravachol [Pravastatin Sodium]    Pravastatin Sodium Other (See Comments)    leg cramps    ROS See HPI    Objective:    Physical Exam Constitutional:      General: She is not in acute distress.    Appearance: Normal appearance. She is well-developed.  HENT:     Head: Normocephalic and atraumatic.     Right Ear: External ear normal.     Left Ear: External ear normal.  Eyes:     General: No scleral icterus. Neck:     Thyroid: No thyromegaly.  Cardiovascular:     Rate and Rhythm: Normal rate and regular rhythm.     Heart sounds: Normal heart sounds. No murmur heard. Pulmonary:     Effort: Pulmonary effort is normal. No respiratory distress.     Breath sounds: Normal breath sounds. No wheezing.  Musculoskeletal:     Cervical back: Neck supple.  Skin:    General: Skin is warm and dry.  Neurological:     Mental Status: She is alert and oriented to person, place, and time.  Psychiatric:        Mood and Affect: Mood normal.        Behavior: Behavior normal.        Thought Content: Thought content normal.        Judgment: Judgment normal.     BP (!) 128/57 (BP Location: Right Arm, Patient Position: Sitting, Cuff Size: Large)   Pulse 70   Temp 98.1 F (36.7 C) (Oral)   Resp 16   Wt 214 lb (97.1 kg)   SpO2 100%   BMI 44.73 kg/m  Wt Readings from Last 3 Encounters:  10/28/21 214 lb (97.1 kg)  10/01/21 215 lb 9.6 oz (97.8 kg)  09/10/21 214 lb (97.1 kg)       Assessment & Plan:   Problem List Items Addressed This Visit       Unprioritized   Hyperlipidemia (Chronic)    Lab Results  Component Value Date   CHOL 121 10/03/2020   HDL 35.60 (L) 10/03/2020   LDLCALC 64  10/03/2020   TRIG 106.0 10/03/2020   CHOLHDL 3 10/03/2020  Continues simvastatin.       Relevant Medications   simvastatin (ZOCOR) 10 MG tablet   Other Relevant Orders   Lipid panel   Essential hypertension (Chronic)    BP Readings from Last 3 Encounters:  10/28/21 (!) 128/57  10/01/21 130/75  09/10/21 (!) 124/50  BP stable on current medications, continues.       Relevant Medications   simvastatin (ZOCOR) 10 MG tablet   Other Relevant Orders   Basic metabolic panel   Migraine without aura    No recent migraines.       Relevant Medications   simvastatin (ZOCOR) 10 MG tablet   Lumbar radiculopathy - Primary    New.  Treatment is limited as she cannot take nsaids due to her CKD and sugar has been above goal so I am hesitant to give her a steroid taper. Will obtain x-ray of her lumbar spine and plan to refer her to physical therapy.       Relevant Orders   DG Lumbar Spine Complete   Ambulatory referral to Physical Therapy   GERD (gastroesophageal reflux disease)    Stable on omeprazole once daily.  Continue same.       Controlled type  2 diabetes mellitus without complication, without long-term current use of insulin (HCC)    Last A1C was above goal. Will obtain follow up A1C today.       Relevant Medications   simvastatin (ZOCOR) 10 MG tablet   Other Relevant Orders   Hemoglobin A1c   B12 deficiency    Continues tablet 1049mg once daily.       I have changed Kalle Smith-Milliken's simvastatin. I am also having her maintain her polyethylene glycol, diphenoxylate-atropine, clotrimazole-betamethasone, hydrocortisone, multivitamin with minerals, albuterol, colchicine, Dexcom G4 Plat Ped Rcv/Share, dicyclomine, cyanocobalamin, vitamin B-12, hydrochlorothiazide, metoprolol succinate, pioglitazone, azaTHIOprine, valsartan, amLODipine, Januvia, omeprazole, glipiZIDE, and lubiprostone.  Meds ordered this encounter  Medications   simvastatin (ZOCOR) 10 MG tablet    Sig:  Take 1 tablet (10 mg total) by mouth daily.    Dispense:  90 tablet    Refill:  1    Requested drug refills are authorized, however, the patient needs further evaluation and/or laboratory testing before further refills are given. Ask her to make an appointment for this.    Order Specific Question:   Supervising Provider    Answer:   BPenni HomansA [[[id number]]

## 2021-10-28 NOTE — Assessment & Plan Note (Addendum)
Last A1C was above goal. Will obtain follow up A1C today.

## 2021-10-28 NOTE — Assessment & Plan Note (Signed)
New.  Treatment is limited as she cannot take nsaids due to her CKD and sugar has been above goal so I am hesitant to give her a steroid taper. Will obtain x-ray of her lumbar spine and plan to refer her to physical therapy.

## 2021-10-28 NOTE — Assessment & Plan Note (Signed)
Stable on omeprazole once daily.  Continue same.

## 2021-10-28 NOTE — Assessment & Plan Note (Signed)
BP Readings from Last 3 Encounters:  10/28/21 (!) 128/57  10/01/21 130/75  09/10/21 (!) 124/50   BP stable on current medications, continues.

## 2021-10-28 NOTE — Assessment & Plan Note (Signed)
No recent migraines.

## 2021-10-28 NOTE — Patient Instructions (Signed)
Please schedule a routine eye exam.

## 2021-10-28 NOTE — Assessment & Plan Note (Signed)
Continues tablet 1053mg once daily.

## 2021-10-30 ENCOUNTER — Telehealth: Payer: Self-pay | Admitting: Family

## 2021-10-30 ENCOUNTER — Other Ambulatory Visit: Payer: Self-pay | Admitting: Family

## 2021-10-30 NOTE — Telephone Encounter (Signed)
Contacted pt and discussed her A1C above goal.  We have maxed out oral therapy options given her renal insufficiency. She states she eats a healthy diet.  I recommended either a daily long acting insulin or a weekly SGLT-2 inhibitor.  She declines injections, declines endo referral. Will let me know if she changes her mind.

## 2021-11-04 ENCOUNTER — Encounter: Payer: Self-pay | Admitting: Internal Medicine

## 2021-11-04 ENCOUNTER — Ambulatory Visit: Payer: PPO | Admitting: Internal Medicine

## 2021-11-04 ENCOUNTER — Other Ambulatory Visit: Payer: Self-pay | Admitting: Pharmacy Technician

## 2021-11-04 VITALS — BP 124/76 | HR 71 | Ht <= 58 in | Wt 214.8 lb

## 2021-11-04 DIAGNOSIS — K624 Stenosis of anus and rectum: Secondary | ICD-10-CM

## 2021-11-04 DIAGNOSIS — K50119 Crohn's disease of large intestine with unspecified complications: Secondary | ICD-10-CM | POA: Diagnosis not present

## 2021-11-04 DIAGNOSIS — K50111 Crohn's disease of large intestine with rectal bleeding: Secondary | ICD-10-CM | POA: Diagnosis not present

## 2021-11-04 DIAGNOSIS — K219 Gastro-esophageal reflux disease without esophagitis: Secondary | ICD-10-CM | POA: Diagnosis not present

## 2021-11-04 NOTE — Patient Instructions (Addendum)
Take Miralax twice a day for 2 days then after that once daily   Stay on current medications Amitiza as prescribed  Follow up in 3 months  If you are age 68 or older, your body mass index should be between 23-30. Your Body mass index is 44.89 kg/m. If this is out of the aforementioned range listed, please consider follow up with your Primary Care Provider.  If you are age 61 or younger, your body mass index should be between 19-25. Your Body mass index is 44.89 kg/m. If this is out of the aformentioned range listed, please consider follow up with your Primary Care Provider.   ________________________________________________________  The  GI providers would like to encourage you to use [facility] to communicate with providers for non-urgent requests or questions.  Due to long hold times on the telephone, sending your provider a message by [facility] may be a faster and more efficient way to get a response.  Please allow 48 business hours for a response.  Please remember that this is for non-urgent requests.  _______________________________________________________   I appreciate the  opportunity to care for you  Thank You   Ulice Dash Pyrtle,MD

## 2021-11-04 NOTE — Progress Notes (Signed)
Subjective:    Patient ID: Kathy [name], female    DOB: 11-Feb-1956, 66 y.o.   MRN: [id number]  HPI Kathy [name] is a 66 year old female with a history of Crohn's disease of the colon and anorectum with prior fistula on infliximab (started April 2022), hypertension, diabetes, hyperlipidemia and asthma who is here for follow-up.  She is here alone today and was last seen in the office on 08/11/2021 and for flexible sigmoidoscopy on 09/10/2021.  See flex sig for details but she had significant anal stenosis and moderate inflammation in the distal rectum and anal canal.  Overall findings were improved.  Biopsies confirmed Crohn's inflammation without dysplasia.  She reports that the anal dilation definitely helped with passing stool.  It seemed to help for a month and while it is still better things are intermittently a problem.  She met with Dr. Dema Severin with [facility].  They did discuss possibility of diversion if the anorectal disease worsens.  She is certainly not interested in this now.  She is seeing much less blood with bowel movement though still sees this occasionally.  She has good and bad days.  Good days for her or when bowel movements are easy with less straining and no abdominal cramping.  Bad days she has upper abdominal cramping and harder time passing stool.  She is using Amitiza 8 mcg twice daily.  Bentyl helps with this pain.  At times with passing stool she will feel a "pop" in stool seems to pass easier.   Review of Systems As per HPI, otherwise negative  Current Medications, Allergies, Past Medical History, Past Surgical History, Family History and Social History were reviewed in [employer] record.    Objective:   Physical Exam BP 124/76   Pulse 71   Ht _0  (1.473 m)   Wt 214 lb 12.8 oz (97.4 kg)   SpO2 95%   BMI 44.89 kg/m . Gen: awake, alert, NAD HEENT: anicteric Abd: soft, NT/ND, +BS throughout Ext: no  c/c/e Neuro: nonfocal      Assessment & Plan:  66 year old female with a history of Crohn's disease of the colon and anorectum with prior fistula on infliximab (started April 2022), hypertension, diabetes, hyperlipidemia and asthma who is here for follow-up.    Crohn's left-sided colitis with perianal involvement/prior fistula/anal stenosis status post manual dilation May 2023 --a lot of her defecate tori symptoms including rectal bleeding, difficult bowel movements are related to the anal stenosis and active inflammation in the distal rectum.  While things are better we certainly have not reached clinical or endoscopic remission.  I think we need to soften stools further to aid with symptoms --Continue infliximab 10 mg/kg every 6 weeks with next infusion 11/12/2021; she prefers to do this without methylprednisolone due to side effects from steroids.  I explained that there is a slightly higher risk of infusion reaction but we can discontinue this for now and monitor --Infliximab level was good when checked last time at 17, antibody negative --Continue azathioprine 100 mg daily --Repeat anal manual dilation with propofol sedation as needed --Bentyl 10 to 20 mg 3 times daily as needed --Avoid Lomotil completely  2.  Constipation exacerbated by anal stenosis --Continue Amitiza 8 mcg twice daily --Add MiraLAX 17 g twice daily for 2 days and then at least daily going forward  3.  GERD with history of esophagitis --continue omeprazole 40 mg daily  107-monthfollow-up  30 minutes total spent today including patient facing  time, coordination of care, reviewing medical history/procedures/pertinent radiology studies, and documentation of the encounter.

## 2021-11-11 ENCOUNTER — Other Ambulatory Visit: Payer: Self-pay | Admitting: Internal Medicine

## 2021-11-12 ENCOUNTER — Ambulatory Visit (INDEPENDENT_AMBULATORY_CARE_PROVIDER_SITE_OTHER): Payer: PPO

## 2021-11-12 VITALS — BP 102/62 | HR 68 | Temp 97.7°F | Resp 18 | Ht <= 58 in | Wt 215.4 lb

## 2021-11-12 DIAGNOSIS — K5090 Crohn's disease, unspecified, without complications: Secondary | ICD-10-CM | POA: Diagnosis not present

## 2021-11-12 DIAGNOSIS — K50111 Crohn's disease of large intestine with rectal bleeding: Secondary | ICD-10-CM | POA: Diagnosis not present

## 2021-11-12 MED ORDER — DIPHENHYDRAMINE HCL 25 MG PO CAPS
25.0000 mg | ORAL_CAPSULE | Freq: Once | ORAL | Status: AC
  Administered 2021-11-12: 25 mg via ORAL
  Filled 2021-11-12: qty 1

## 2021-11-12 MED ORDER — ACETAMINOPHEN 325 MG PO TABS
650.0000 mg | ORAL_TABLET | Freq: Once | ORAL | Status: AC
  Administered 2021-11-12: 650 mg via ORAL
  Filled 2021-11-12: qty 2

## 2021-11-12 MED ORDER — SODIUM CHLORIDE 0.9 % IV SOLN
10.0000 mg/kg | Freq: Once | INTRAVENOUS | Status: AC
  Administered 2021-11-12: 1000 mg via INTRAVENOUS
  Filled 2021-11-12: qty 100

## 2021-11-12 NOTE — Progress Notes (Signed)
Diagnosis: Crohn's Disease  Provider:  Marshell Garfinkel, MD  Procedure: Infusion  IV Type: Peripheral, IV Location: L Antecubital  Remicade (Infliximab), Dose: 1000 mg  Infusion Start Time: 1127  Infusion Stop Time: 7034  Post Infusion IV Care: Peripheral IV Discontinued  Discharge: Condition: Good, Destination: Home . AVS provided to patient.   Performed by:  Cleophus Molt, RN

## 2021-11-24 ENCOUNTER — Other Ambulatory Visit: Payer: Self-pay | Admitting: Family

## 2021-11-24 DIAGNOSIS — I119 Hypertensive heart disease without heart failure: Secondary | ICD-10-CM

## 2021-11-26 LAB — HM DIABETES EYE EXAM

## 2021-12-24 ENCOUNTER — Ambulatory Visit (INDEPENDENT_AMBULATORY_CARE_PROVIDER_SITE_OTHER): Payer: PPO

## 2021-12-24 VITALS — BP 109/73 | HR 64 | Temp 97.8°F | Resp 16 | Wt 214.4 lb

## 2021-12-24 DIAGNOSIS — K50111 Crohn's disease of large intestine with rectal bleeding: Secondary | ICD-10-CM | POA: Diagnosis not present

## 2021-12-24 MED ORDER — DIPHENHYDRAMINE HCL 25 MG PO CAPS
25.0000 mg | ORAL_CAPSULE | Freq: Once | ORAL | Status: AC
  Administered 2021-12-24: 25 mg via ORAL
  Filled 2021-12-24: qty 1

## 2021-12-24 MED ORDER — SODIUM CHLORIDE 0.9 % IV SOLN
1000.0000 mg | Freq: Once | INTRAVENOUS | Status: AC
  Administered 2021-12-24: 1000 mg via INTRAVENOUS
  Filled 2021-12-24: qty 100

## 2021-12-24 MED ORDER — ACETAMINOPHEN 325 MG PO TABS
650.0000 mg | ORAL_TABLET | Freq: Once | ORAL | Status: AC
  Administered 2021-12-24: 650 mg via ORAL
  Filled 2021-12-24: qty 2

## 2021-12-24 NOTE — Progress Notes (Signed)
Diagnosis: Crohn's Disease  Provider:  Marshell Garfinkel MD  Procedure: Infusion  IV Type: Peripheral, IV Location: R Antecubital  Remicade (Infliximab), Dose: 1000 mg  Infusion Start Time: 4818 am  Infusion Stop Time: 1400 pm  Post Infusion IV Care: Peripheral IV Discontinued  Discharge: Condition: Good, Destination: Home . AVS provided to patient.   Performed by:  Adelina Mings, LPN

## 2021-12-29 ENCOUNTER — Other Ambulatory Visit: Payer: Self-pay | Admitting: Family

## 2021-12-29 DIAGNOSIS — Z8679 Personal history of other diseases of the circulatory system: Secondary | ICD-10-CM

## 2022-01-06 ENCOUNTER — Other Ambulatory Visit: Payer: Self-pay | Admitting: Internal Medicine

## 2022-01-18 ENCOUNTER — Other Ambulatory Visit: Payer: Self-pay | Admitting: Family

## 2022-01-18 DIAGNOSIS — E119 Type 2 diabetes mellitus without complications: Secondary | ICD-10-CM

## 2022-02-04 ENCOUNTER — Ambulatory Visit (INDEPENDENT_AMBULATORY_CARE_PROVIDER_SITE_OTHER): Payer: PPO

## 2022-02-04 ENCOUNTER — Other Ambulatory Visit: Payer: Self-pay | Admitting: Family

## 2022-02-04 VITALS — BP 130/74 | HR 66 | Temp 97.8°F | Resp 18 | Ht <= 58 in | Wt 213.2 lb

## 2022-02-04 DIAGNOSIS — K50111 Crohn's disease of large intestine with rectal bleeding: Secondary | ICD-10-CM | POA: Diagnosis not present

## 2022-02-04 MED ORDER — SODIUM CHLORIDE 0.9 % IV SOLN
10.0000 mg/kg | Freq: Once | INTRAVENOUS | Status: AC
  Administered 2022-02-04: 1000 mg via INTRAVENOUS
  Filled 2022-02-04: qty 100

## 2022-02-04 MED ORDER — ACETAMINOPHEN 325 MG PO TABS
650.0000 mg | ORAL_TABLET | Freq: Once | ORAL | Status: AC
  Administered 2022-02-04: 650 mg via ORAL
  Filled 2022-02-04: qty 2

## 2022-02-04 MED ORDER — DIPHENHYDRAMINE HCL 25 MG PO CAPS
25.0000 mg | ORAL_CAPSULE | Freq: Once | ORAL | Status: AC
  Administered 2022-02-04: 25 mg via ORAL
  Filled 2022-02-04: qty 1

## 2022-02-04 NOTE — Progress Notes (Signed)
Diagnosis: Crohn's Disease  Provider:  Marshell Garfinkel MD  Procedure: Infusion  IV Type: Peripheral, IV Location: R Antecubital  Remicade (Infliximab), Dose: 1000 mg  Infusion Start Time: 1206  Infusion Stop Time: 8099  Post Infusion IV Care: Peripheral IV Discontinued  Discharge: Condition: Good, Destination: Home . AVS provided to patient.   Performed by:  Cleophus Molt, RN

## 2022-02-24 ENCOUNTER — Encounter: Payer: Self-pay | Admitting: Internal Medicine

## 2022-02-24 ENCOUNTER — Other Ambulatory Visit (HOSPITAL_BASED_OUTPATIENT_CLINIC_OR_DEPARTMENT_OTHER): Payer: Self-pay

## 2022-02-24 MED ORDER — FLUAD QUADRIVALENT 0.5 ML IM PRSY
PREFILLED_SYRINGE | INTRAMUSCULAR | 0 refills | Status: DC
  Filled 2022-02-24: qty 0.5, 1d supply, fill #0

## 2022-03-01 ENCOUNTER — Other Ambulatory Visit: Payer: Self-pay | Admitting: Family

## 2022-03-01 ENCOUNTER — Other Ambulatory Visit (HOSPITAL_BASED_OUTPATIENT_CLINIC_OR_DEPARTMENT_OTHER): Payer: Self-pay

## 2022-03-01 DIAGNOSIS — I119 Hypertensive heart disease without heart failure: Secondary | ICD-10-CM

## 2022-03-01 MED ORDER — COMIRNATY 30 MCG/0.3ML IM SUSY
PREFILLED_SYRINGE | INTRAMUSCULAR | 0 refills | Status: DC
  Filled 2022-03-01: qty 0.3, 1d supply, fill #0

## 2022-03-01 NOTE — Telephone Encounter (Signed)
Pt needs appointment please call.

## 2022-03-10 ENCOUNTER — Encounter: Payer: Self-pay | Admitting: Family

## 2022-03-10 ENCOUNTER — Ambulatory Visit (INDEPENDENT_AMBULATORY_CARE_PROVIDER_SITE_OTHER): Payer: PPO | Admitting: Family

## 2022-03-10 VITALS — BP 108/45 | HR 69 | Temp 97.9°F | Resp 18 | Ht <= 58 in | Wt 212.4 lb

## 2022-03-10 DIAGNOSIS — G43009 Migraine without aura, not intractable, without status migrainosus: Secondary | ICD-10-CM

## 2022-03-10 DIAGNOSIS — E119 Type 2 diabetes mellitus without complications: Secondary | ICD-10-CM

## 2022-03-10 DIAGNOSIS — E538 Deficiency of other specified B group vitamins: Secondary | ICD-10-CM | POA: Diagnosis not present

## 2022-03-10 DIAGNOSIS — J45909 Unspecified asthma, uncomplicated: Secondary | ICD-10-CM | POA: Diagnosis not present

## 2022-03-10 DIAGNOSIS — I10 Essential (primary) hypertension: Secondary | ICD-10-CM

## 2022-03-10 DIAGNOSIS — K50111 Crohn's disease of large intestine with rectal bleeding: Secondary | ICD-10-CM

## 2022-03-10 DIAGNOSIS — K581 Irritable bowel syndrome with constipation: Secondary | ICD-10-CM

## 2022-03-10 DIAGNOSIS — K2100 Gastro-esophageal reflux disease with esophagitis, without bleeding: Secondary | ICD-10-CM

## 2022-03-10 DIAGNOSIS — N1832 Chronic kidney disease, stage 3b: Secondary | ICD-10-CM

## 2022-03-10 DIAGNOSIS — E7800 Pure hypercholesterolemia, unspecified: Secondary | ICD-10-CM

## 2022-03-10 LAB — BASIC METABOLIC PANEL
BUN: 20 mg/dL (ref 6–23)
CO2: 32 mEq/L (ref 19–32)
Calcium: 9.6 mg/dL (ref 8.4–10.5)
Chloride: 98 mEq/L (ref 96–112)
Creatinine, Ser: 1.43 mg/dL — ABNORMAL HIGH (ref 0.40–1.20)
GFR: 37.66 mL/min — ABNORMAL LOW (ref >60.00)
Glucose, Bld: 162 mg/dL — ABNORMAL HIGH (ref 70–99)
Potassium: 4 mEq/L (ref 3.5–5.1)
Sodium: 138 mEq/L (ref 135–145)

## 2022-03-10 LAB — VITAMIN B12: Vitamin B-12: 532 pg/mL (ref 211–911)

## 2022-03-10 LAB — HEMOGLOBIN A1C: Hgb A1c MFr Bld: 7 % — ABNORMAL HIGH (ref 4.6–6.5)

## 2022-03-10 MED ORDER — DEXCOM G7 RECEIVER DEVI: 0 refills | Status: DC

## 2022-03-10 MED ORDER — ALBUTEROL SULFATE HFA 108 (90 BASE) MCG/ACT IN AERS: 1.0000 | INHALATION_SPRAY | Freq: Four times a day (QID) | RESPIRATORY_TRACT | 5 refills | Status: AC | PRN

## 2022-03-10 MED ORDER — DEXCOM G7 SENSOR MISC: 5 refills | Status: DC

## 2022-03-10 NOTE — Assessment & Plan Note (Signed)
BP Readings from Last 3 Encounters:  03/10/22 (!) 108/45  02/04/22 130/74  12/24/21 109/73   Maintained on hctz, metoprolol, diovan, valsartan.  Will d/c hctz as bp on low side today. Recommend that she check bp at home a few times over the coming week and send me her readings.

## 2022-03-10 NOTE — Assessment & Plan Note (Signed)
Has seen Control and instrumentation engineer.

## 2022-03-10 NOTE — Assessment & Plan Note (Signed)
Lab Results  Component Value Date   HGBA1C 7.7 (H) 10/28/2021   HGBA1C 7.8 (H) 07/08/2021   HGBA1C 7.3 (H) 04/06/2021   Lab Results  Component Value Date   MICROALBUR 2.8 (H) 04/06/2021   LDLCALC 69 10/28/2021   CREATININE 1.42 (H) 10/28/2021   On actos, glucotrol xl.  Reports glucose 130 usually.  Low once below 100.

## 2022-03-10 NOTE — Assessment & Plan Note (Signed)
Continues amitiza with good results.

## 2022-03-10 NOTE — Assessment & Plan Note (Signed)
Occasional wheezing, will refill albuterol.

## 2022-03-10 NOTE — Assessment & Plan Note (Signed)
Only bothers her with certain foods. Monitor.

## 2022-03-10 NOTE — Assessment & Plan Note (Signed)
Pt was referred to nephrology last spring. Did not hear back, will re-initiate referral.

## 2022-03-10 NOTE — Assessment & Plan Note (Signed)
Not currently taking b12 pills at home, no injections. Check follow up b12.

## 2022-03-10 NOTE — Assessment & Plan Note (Signed)
On simvastatin.  Lab Results  Component Value Date   CHOL 127 10/28/2021   HDL 35.80 (L) 10/28/2021   LDLCALC 69 10/28/2021   TRIG 112.0 10/28/2021   CHOLHDL 4 10/28/2021  Stable, continue same.

## 2022-03-10 NOTE — Patient Instructions (Addendum)
[facility]- [phone]

## 2022-03-10 NOTE — Progress Notes (Signed)
Subjective:     Patient ID: Kathy [name], female    DOB: Jan 16, 1956, 66 y.o.   MRN: [id number]  Chief Complaint  Patient presents with   Follow-up    Try Dexcom D7    HPI Patient is in today for  follow up.  DM2- requesting Dexcom 7 monitor not the 6.  Lab Results  Component Value Date   HGBA1C 7.7 (H) 10/28/2021   HGBA1C 7.8 (H) 07/08/2021   HGBA1C 7.3 (H) 04/06/2021   Lab Results  Component Value Date   MICROALBUR 2.8 (H) 04/06/2021   LDLCALC 69 10/28/2021   CREATININE 1.42 (H) 10/28/2021   HTN- maintained on toprol xl, amlodipine, hctz, valsartan.   Gerd- stable as long as she avoids certain foods.   Hyperlipidemia- continues statin.   B12 deficiency- only taking oral supplement.   Health Maintenance Due  Topic Date Due   Medicare Annual Wellness (AWV)  10/11/2019   COVID-19 Vaccine (4 - Pfizer risk series) 04/18/2020   COLONOSCOPY (Pts 45-40yr Insurance coverage will need to be confirmed)  05/22/2021   Diabetic kidney evaluation - Urine ACR  04/06/2022    Past Medical History:  Diagnosis Date   Anal stenosis    Asthma    09-23-2017  per pt has not done inhaler since May 2018, no insurance (QVAR bid and Ventolin prn)   B12 deficiency 01/05/2021   [place]    Diastolic dysfunction    per echo [phone] grade 2 diastolic dysfunction , ef 60-65%   Esophagitis    GERD (gastroesophageal reflux disease)    Hemorrhoids    Hiatal hernia    Hyperlipidemia    stopped meds due to side effects   Hypertension    09-23-2017  per pt has takes bp meds since May 2018 no insurance (losartan 1047mqd, HCTZ 12.51m70md, and toprol 50 qd)   Microalbuminuria 06/01/2006   Qualifier: Diagnosis of  By: PatPosey Pronto, Sejal     Type 2 diabetes mellitus ([place]  09-23-2017 per pt has not taken diabetic meds since May 2018, no insurance (kombiglyze 5-1000m18m)    Past Surgical History:  Procedure Laterality Date   BREAST BIOPSY Bilateral    CARDIOVASCULAR  STRESS TEST  07/29/2009   normal nuclear study w/ no ischemia/  normal LV function and wall motion,  ef 70%   CESAREAN SECTION  1980s   RECTAL BIOPSY N/A 10/03/2017   Procedure: POSSIBLE BIOPSY RECTAL;  Surgeon: WhitIleana Roup;  Location: [facility];  Service: General;  Laterality: N/A;    Family History  Problem Relation Age of Onset   Hypertension Mother 70  64troke Mother 70  5iabetes Maternal Aunt        s/p bilater amputation due to DM   Asthma Father    Colon cancer Neg Hx    Esophageal cancer Neg Hx    Stomach cancer Neg Hx    Rectal cancer Neg Hx     Social History   Socioeconomic History   Marital status: Married    Spouse name: Not on file   Number of children: 2   Years of education: Not on file   Highest education level: Not on file  Occupational History   Occupation: unemployed  Tobacco Use   Smoking status: Never   Smokeless tobacco: Never  Vaping Use   Vaping Use: Never used  Substance and Sexual Activity   Alcohol use: No   Drug use: No  Sexual activity: Not Currently  Other Topics Concern   Not on file  Social History Narrative   Not working currently. Retired in 2019. Previously worked in [employer] job   Married    2 children (son and daughter). They are both local   She has 1 grand-daughter and 1 grand-son   No pets (allergic)   Completed 4 years of college.   Social Determinants of Health   Financial Resource Strain: Not on file  Food Insecurity: Not on file  Transportation Needs: Not on file  Physical Activity: Not on file  Stress: Not on file  Social Connections: Not on file  Intimate Partner Violence: Not on file    Outpatient Medications Prior to Visit  Medication Sig Dispense Refill   amLODipine (NORVASC) 10 MG tablet Take 1 tablet by mouth once daily 90 tablet 0   azaTHIOprine (IMURAN) 50 MG tablet Take 2 tablets by mouth once daily 60 tablet 3   clotrimazole-betamethasone (LOTRISONE) cream Apply 1 application  topically 2 (two) times daily. 30 g 1   colchicine 0.6 MG tablet Take 2 tabs by mouth now and 1 tab in 1 hour. 6 tablet 1   COVID-19 mRNA vaccine 2023-2024 (COMIRNATY) syringe Inject into the muscle. 0.3 mL 0   dicyclomine (BENTYL) 10 MG capsule TAKE 1 CAPSULE BY MOUTH THREE TIMES DAILY AS NEEDED FOR  CRAMPING 90 capsule 2   diphenoxylate-atropine (LOMOTIL) 2.5-0.025 MG tablet TAKE 1 TABLET BY MOUTH 4 TIMES DAILY AS NEEDED FOR DIARRHEA OR LOOSE STOOLS 120 tablet 1   glipiZIDE (GLUCOTROL XL) 10 MG 24 hr tablet Take 1 tablet by mouth once daily with breakfast 90 tablet 1   hydrochlorothiazide (MICROZIDE) 12.5 MG capsule Take 1 capsule by mouth once daily 90 capsule 0   hydrocortisone (ANUSOL-HC) 25 MG suppository Place 1 suppository (25 mg total) rectally at bedtime. 14 suppository 0   influenza vaccine adjuvanted (FLUAD QUADRIVALENT) 0.5 ML injection Inject into the muscle. 0.5 mL 0   JANUVIA 50 MG tablet Take 1 tablet by mouth once daily 30 tablet 0   lubiprostone (AMITIZA) 8 MCG capsule TAKE 1 CAPSULE BY MOUTH TWICE DAILY WITH A MEAL 60 capsule 2   metoprolol succinate (TOPROL-XL) 50 MG 24 hr tablet Take 1 tablet by mouth once daily 90 tablet 1   Multiple Vitamins-Minerals (MULTIVITAMIN WITH MINERALS) tablet Take 1 tablet by mouth daily.     omeprazole (PRILOSEC) 40 MG capsule Take 1 capsule by mouth once daily 90 capsule 0   pioglitazone (ACTOS) 30 MG tablet Take 1 tablet (30 mg total) by mouth daily. 90 tablet 1   polyethylene glycol (MIRALAX / GLYCOLAX) 17 g packet Take 17 g by mouth as needed. 1 capful prn 30 each 11   simvastatin (ZOCOR) 10 MG tablet Take 1 tablet (10 mg total) by mouth daily. 90 tablet 1   valsartan (DIOVAN) 40 MG tablet Take 1 tablet (40 mg total) by mouth daily. 30 tablet 0   vitamin B-12 (CYANOCOBALAMIN) 1000 MCG tablet Take 1 tablet (1,000 mcg total) by mouth daily. 1 tablet 0   albuterol (VENTOLIN HFA) 108 (90 Base) MCG/ACT inhaler Inhale 1 puff into the lungs every 6  (six) hours as needed for wheezing or shortness of breath. 18 g 5   Continuous Blood Gluc Receiver (DEXCOM G4 PLAT PED RCV/SHARE) DEVI Use as directed 1 each 0   cyanocobalamin (,VITAMIN B-12,) 1000 MCG/ML injection Inject 1 mL (1,000 mcg total) into the muscle every 30 (thirty) days.  3 mL 0   No facility-administered medications prior to visit.    Allergies  Allergen Reactions   Lipitor [Atorvastatin Calcium]     Leg cramps    Lisinopril Itching   Pravachol [Pravastatin Sodium]    Pravastatin Sodium Other (See Comments)    leg cramps    Review of Systems  Respiratory:  Positive for wheezing.   Gastrointestinal:  Negative for constipation.  Neurological:  Negative for headaches.       Objective:    Physical Exam Constitutional:      General: She is not in acute distress.    Appearance: Normal appearance. She is well-developed.  HENT:     Head: Normocephalic and atraumatic.     Right Ear: External ear normal.     Left Ear: External ear normal.  Eyes:     General: No scleral icterus. Neck:     Thyroid: No thyromegaly.  Cardiovascular:     Rate and Rhythm: Normal rate and regular rhythm.     Heart sounds: Normal heart sounds. No murmur heard. Pulmonary:     Effort: Pulmonary effort is normal. No respiratory distress.     Breath sounds: Normal breath sounds. No wheezing.  Musculoskeletal:        General: No swelling.     Cervical back: Neck supple.  Skin:    General: Skin is warm and dry.  Neurological:     Mental Status: She is alert and oriented to person, place, and time.  Psychiatric:        Mood and Affect: Mood normal.        Behavior: Behavior normal.        Thought Content: Thought content normal.        Judgment: Judgment normal.     BP (!) 108/45 (BP Location: Right Arm, Patient Position: Sitting, Cuff Size: Large)   Pulse 69   Temp 97.9 F (36.6 C) (Oral)   Resp 18   Ht 4' 10"  (1.473 m)   Wt 212 lb 6.4 oz (96.3 kg)   SpO2 100%   BMI 44.39  kg/m  Wt Readings from Last 3 Encounters:  03/10/22 212 lb 6.4 oz (96.3 kg)  02/04/22 213 lb 3.2 oz (96.7 kg)  12/24/21 214 lb 6.4 oz (97.3 kg)       Assessment & Plan:   Problem List Items Addressed This Visit       Unprioritized   Hyperlipidemia (Chronic)    On simvastatin.  Lab Results  Component Value Date   CHOL 127 10/28/2021   HDL 35.80 (L) 10/28/2021   LDLCALC 69 10/28/2021   TRIG 112.0 10/28/2021   CHOLHDL 4 10/28/2021  Stable, continue same.        Essential hypertension - Primary (Chronic)    BP Readings from Last 3 Encounters:  03/10/22 (!) 108/45  02/04/22 130/74  12/24/21 109/73  Maintained on hctz, metoprolol, diovan, valsartan.  Will d/c hctz as bp on low side today. Recommend that she check bp at home a few times over the coming week and send me her readings.       Relevant Orders   Basic metabolic panel   Migraine without aura    No recent migraines.       Irritable bowel syndrome with constipation    Continues amitiza with good results.       GERD (gastroesophageal reflux disease)    Only bothers her with certain foods. Monitor.       Crohn disease (  [place])    Has seen Control and instrumentation engineer.       Controlled type 2 diabetes mellitus without complication, without long-term current use of insulin (HCC)    Lab Results  Component Value Date   HGBA1C 7.7 (H) 10/28/2021   HGBA1C 7.8 (H) 07/08/2021   HGBA1C 7.3 (H) 04/06/2021   Lab Results  Component Value Date   MICROALBUR 2.8 (H) 04/06/2021   LDLCALC 69 10/28/2021   CREATININE 1.42 (H) 10/28/2021  On actos, glucotrol xl.  Reports glucose 130 usually.  Low once below 100.       Relevant Orders   Hemoglobin A1c   Chronic kidney disease, stage 3b ([place])    Pt was referred to nephrology last spring. Did not hear back, will re-initiate referral.       Relevant Orders   Ambulatory referral to Nephrology   B12 deficiency    Not currently taking b12 pills at home, no injections. Check  follow up b12.       Relevant Orders   B12   Asthma    Occasional wheezing, will refill albuterol.       Relevant Medications   albuterol (VENTOLIN HFA) 108 (90 Base) MCG/ACT inhaler    I have discontinued Tanaisha Smith-Milliken's Dexcom G4 Plat Ped Rcv/Share. I am also having her start on Dexcom G7 Sensor and Dexcom G7 Receiver. Additionally, I am having her maintain her polyethylene glycol, diphenoxylate-atropine, clotrimazole-betamethasone, hydrocortisone, multivitamin with minerals, colchicine, dicyclomine, cyanocobalamin, metoprolol succinate, pioglitazone, lubiprostone, simvastatin, azaTHIOprine, hydrochlorothiazide, omeprazole, glipiZIDE, valsartan, Fluad Quadrivalent, amLODipine, Januvia, Comirnaty, and albuterol.  Meds ordered this encounter  Medications   Continuous Blood Gluc Sensor (DEXCOM G7 SENSOR) MISC    Sig: Change every 10 days    Dispense:  4 each    Refill:  5    Order Specific Question:   Supervising Provider    Answer:   Penni Homans A [[id number]]   Continuous Blood Gluc Receiver (DEXCOM G7 RECEIVER) DEVI    Sig: Use as directed    Dispense:  1 each    Refill:  0    Order Specific Question:   Supervising Provider    Answer:   Penni Homans A [[id number]]   albuterol (VENTOLIN HFA) 108 (90 Base) MCG/ACT inhaler    Sig: Inhale 1 puff into the lungs every 6 (six) hours as needed for wheezing or shortness of breath.    Dispense:  18 g    Refill:  5    Order Specific Question:   Supervising Provider    Answer:   Penni Homans A [[id number]]

## 2022-03-10 NOTE — Assessment & Plan Note (Signed)
No recent migraines.

## 2022-03-15 ENCOUNTER — Other Ambulatory Visit: Payer: Self-pay | Admitting: Internal Medicine

## 2022-03-15 ENCOUNTER — Other Ambulatory Visit: Payer: Self-pay | Admitting: Family

## 2022-03-25 ENCOUNTER — Ambulatory Visit (INDEPENDENT_AMBULATORY_CARE_PROVIDER_SITE_OTHER): Payer: PPO

## 2022-03-25 VITALS — BP 129/75 | HR 55 | Temp 97.8°F | Resp 18 | Ht <= 58 in | Wt 214.0 lb

## 2022-03-25 DIAGNOSIS — K50111 Crohn's disease of large intestine with rectal bleeding: Secondary | ICD-10-CM | POA: Diagnosis not present

## 2022-03-25 MED ORDER — DIPHENHYDRAMINE HCL 25 MG PO CAPS
25.0000 mg | ORAL_CAPSULE | Freq: Once | ORAL | Status: AC
  Administered 2022-03-25: 25 mg via ORAL
  Filled 2022-03-25: qty 1

## 2022-03-25 MED ORDER — ACETAMINOPHEN 325 MG PO TABS
650.0000 mg | ORAL_TABLET | Freq: Once | ORAL | Status: AC
  Administered 2022-03-25: 650 mg via ORAL
  Filled 2022-03-25: qty 2

## 2022-03-25 MED ORDER — SODIUM CHLORIDE 0.9 % IV SOLN
10.0000 mg/kg | Freq: Once | INTRAVENOUS | Status: AC
  Administered 2022-03-25: 1000 mg via INTRAVENOUS
  Filled 2022-03-25: qty 100

## 2022-03-25 NOTE — Progress Notes (Signed)
Diagnosis: Crohn's Disease  Provider:  Marshell Garfinkel MD  Procedure: Infusion  IV Type: Peripheral, IV Location: L Antecubital  Remicade (Infliximab), Dose: 1000 mg  Infusion Start Time: 3167  Infusion Stop Time: 1340  Post Infusion IV Care: Peripheral IV Discontinued  Discharge: Condition: Good, Destination: Home . AVS provided to patient.   Performed by:  Arnoldo Morale, RN

## 2022-03-30 ENCOUNTER — Other Ambulatory Visit: Payer: Self-pay | Admitting: Family

## 2022-04-18 ENCOUNTER — Other Ambulatory Visit: Payer: Self-pay | Admitting: Internal Medicine

## 2022-04-27 ENCOUNTER — Telehealth: Payer: Self-pay | Admitting: Internal Medicine

## 2022-04-27 MED ORDER — AZATHIOPRINE 50 MG PO TABS: 100.0000 mg | ORAL_TABLET | Freq: Every day | ORAL | 1 refills | Status: DC

## 2022-04-27 NOTE — Telephone Encounter (Signed)
I have spoken to patient and advised that she needs office visit for additional refills. She was to follow in the office 3 months following her 10/2021 visit. Patient has scheduled follow up on 07/02/22. Refills of azathioprine sent until her appointment.

## 2022-04-27 NOTE — Telephone Encounter (Signed)
Inbound call from patient stating pharmacy has denied refill for azaTHIOprine. Please advise

## 2022-04-28 ENCOUNTER — Other Ambulatory Visit: Payer: Self-pay | Admitting: Internal Medicine

## 2022-05-03 ENCOUNTER — Other Ambulatory Visit: Payer: Self-pay | Admitting: Internal Medicine

## 2022-05-03 ENCOUNTER — Telehealth: Payer: Self-pay

## 2022-05-03 NOTE — Telephone Encounter (Signed)
Preferred brand is [employer].

## 2022-05-03 NOTE — Telephone Encounter (Signed)
PA initiated via Covermymeds; KEY: [id number]. Awaiting determination.

## 2022-05-04 MED ORDER — FREESTYLE LIBRE 14 DAY SENSOR MISC: 5 refills | Status: DC

## 2022-05-06 ENCOUNTER — Ambulatory Visit (INDEPENDENT_AMBULATORY_CARE_PROVIDER_SITE_OTHER): Payer: PPO

## 2022-05-06 VITALS — BP 118/73 | HR 66 | Temp 97.8°F | Resp 16 | Ht <= 58 in | Wt 216.0 lb

## 2022-05-06 DIAGNOSIS — K50111 Crohn's disease of large intestine with rectal bleeding: Secondary | ICD-10-CM | POA: Diagnosis not present

## 2022-05-06 MED ORDER — SODIUM CHLORIDE 0.9 % IV SOLN
10.0000 mg/kg | Freq: Once | INTRAVENOUS | Status: AC
  Administered 2022-05-06: 1000 mg via INTRAVENOUS
  Filled 2022-05-06: qty 100

## 2022-05-06 MED ORDER — ACETAMINOPHEN 325 MG PO TABS
650.0000 mg | ORAL_TABLET | Freq: Once | ORAL | Status: AC
  Administered 2022-05-06: 650 mg via ORAL
  Filled 2022-05-06: qty 2

## 2022-05-06 MED ORDER — DIPHENHYDRAMINE HCL 25 MG PO CAPS
25.0000 mg | ORAL_CAPSULE | Freq: Once | ORAL | Status: AC
  Administered 2022-05-06: 25 mg via ORAL
  Filled 2022-05-06: qty 1

## 2022-05-06 NOTE — Progress Notes (Signed)
Diagnosis: Crohn's Disease  Provider:  Marshell Garfinkel MD  Procedure: Infusion  IV Type: Peripheral, IV Location: L Antecubital  Remicade (Infliximab), Dose: 1000 mg  Infusion Start Time: 4098  Infusion Stop Time: 1330  Post Infusion IV Care: Peripheral IV Discontinued  Discharge: Condition: Good, Destination: Home . AVS provided to patient.   Performed by:  Koren Shiver, RN

## 2022-05-24 ENCOUNTER — Other Ambulatory Visit: Payer: Self-pay | Admitting: Family

## 2022-05-31 ENCOUNTER — Other Ambulatory Visit: Payer: Self-pay | Admitting: Family

## 2022-05-31 ENCOUNTER — Encounter: Payer: Self-pay | Admitting: *Deleted

## 2022-05-31 DIAGNOSIS — I119 Hypertensive heart disease without heart failure: Secondary | ICD-10-CM

## 2022-06-02 DIAGNOSIS — R809 Proteinuria, unspecified: Secondary | ICD-10-CM | POA: Diagnosis not present

## 2022-06-02 DIAGNOSIS — N2581 Secondary hyperparathyroidism of renal origin: Secondary | ICD-10-CM | POA: Diagnosis not present

## 2022-06-02 DIAGNOSIS — I129 Hypertensive chronic kidney disease with stage 1 through stage 4 chronic kidney disease, or unspecified chronic kidney disease: Secondary | ICD-10-CM | POA: Diagnosis not present

## 2022-06-02 DIAGNOSIS — D631 Anemia in chronic kidney disease: Secondary | ICD-10-CM | POA: Diagnosis not present

## 2022-06-02 DIAGNOSIS — N1832 Chronic kidney disease, stage 3b: Secondary | ICD-10-CM | POA: Diagnosis not present

## 2022-06-03 ENCOUNTER — Other Ambulatory Visit: Payer: Self-pay | Admitting: Nephrology

## 2022-06-03 DIAGNOSIS — N1832 Chronic kidney disease, stage 3b: Secondary | ICD-10-CM

## 2022-06-03 DIAGNOSIS — I129 Hypertensive chronic kidney disease with stage 1 through stage 4 chronic kidney disease, or unspecified chronic kidney disease: Secondary | ICD-10-CM

## 2022-06-03 DIAGNOSIS — D631 Anemia in chronic kidney disease: Secondary | ICD-10-CM

## 2022-06-03 DIAGNOSIS — N2581 Secondary hyperparathyroidism of renal origin: Secondary | ICD-10-CM

## 2022-06-03 DIAGNOSIS — R809 Proteinuria, unspecified: Secondary | ICD-10-CM

## 2022-06-09 ENCOUNTER — Other Ambulatory Visit: Payer: Self-pay | Admitting: Family

## 2022-06-14 ENCOUNTER — Telehealth: Payer: Self-pay | Admitting: Internal Medicine

## 2022-06-14 NOTE — Telephone Encounter (Signed)
Incoming call from [place] [facility] needing another Reglan Rx sent to them in order to provide it to the patient. Please advise

## 2022-06-15 NOTE — Telephone Encounter (Signed)
Left message to return call. Questions regarding refills for Reglan.

## 2022-06-17 ENCOUNTER — Ambulatory Visit (INDEPENDENT_AMBULATORY_CARE_PROVIDER_SITE_OTHER): Payer: PPO

## 2022-06-17 VITALS — BP 140/79 | HR 60 | Temp 97.8°F | Resp 16 | Ht <= 58 in | Wt 215.2 lb

## 2022-06-17 DIAGNOSIS — K50111 Crohn's disease of large intestine with rectal bleeding: Secondary | ICD-10-CM

## 2022-06-17 MED ORDER — SODIUM CHLORIDE 0.9 % IV SOLN
10.0000 mg/kg | Freq: Once | INTRAVENOUS | Status: AC
  Administered 2022-06-17: 1000 mg via INTRAVENOUS
  Filled 2022-06-17: qty 100

## 2022-06-17 MED ORDER — ACETAMINOPHEN 325 MG PO TABS
650.0000 mg | ORAL_TABLET | Freq: Once | ORAL | Status: AC
  Administered 2022-06-17: 650 mg via ORAL

## 2022-06-17 MED ORDER — DIPHENHYDRAMINE HCL 25 MG PO CAPS
25.0000 mg | ORAL_CAPSULE | Freq: Once | ORAL | Status: AC
  Administered 2022-06-17: 25 mg via ORAL

## 2022-06-17 NOTE — Telephone Encounter (Signed)
Attempted to reach patient, but no voicemail available and no answer. Will attempt to reach out again at a later time. We need to clarify with the patient which prescription she needs refills of. She was only given reglan 1 time (2 tablets) in 2022 for a colonoscopy prep. Is she needing azathioprine refill instead?

## 2022-06-17 NOTE — Progress Notes (Signed)
Diagnosis: Crohn's Disease  Provider:  Marshell Garfinkel MD  Procedure: Infusion  IV Type: Peripheral, IV Location: L Antecubital  Remicade (Infliximab), Dose: 1000 mg  Infusion Start Time: 1119  Infusion Stop Time: [id number]  Post Infusion IV Care: Peripheral IV Discontinued  Discharge: Condition: Good, Destination: Home . AVS Provided  Performed by:  Paul Dykes, RN

## 2022-06-18 NOTE — Telephone Encounter (Signed)
Left message for patient to call back  

## 2022-06-21 NOTE — Telephone Encounter (Signed)
Left a third message for patient to call back. Will await her return call before moving forward.

## 2022-06-28 ENCOUNTER — Other Ambulatory Visit: Payer: Self-pay | Admitting: Family

## 2022-06-28 ENCOUNTER — Other Ambulatory Visit: Payer: Self-pay | Admitting: Internal Medicine

## 2022-06-28 DIAGNOSIS — I10 Essential (primary) hypertension: Secondary | ICD-10-CM

## 2022-07-02 ENCOUNTER — Ambulatory Visit: Payer: PPO | Admitting: Internal Medicine

## 2022-07-02 ENCOUNTER — Encounter: Payer: Self-pay | Admitting: Internal Medicine

## 2022-07-02 ENCOUNTER — Other Ambulatory Visit (INDEPENDENT_AMBULATORY_CARE_PROVIDER_SITE_OTHER): Payer: PPO

## 2022-07-02 VITALS — BP 144/88 | HR 80 | Ht <= 58 in | Wt 212.1 lb

## 2022-07-02 DIAGNOSIS — D509 Iron deficiency anemia, unspecified: Secondary | ICD-10-CM

## 2022-07-02 DIAGNOSIS — K50119 Crohn's disease of large intestine with unspecified complications: Secondary | ICD-10-CM

## 2022-07-02 DIAGNOSIS — R109 Unspecified abdominal pain: Secondary | ICD-10-CM

## 2022-07-02 DIAGNOSIS — K5900 Constipation, unspecified: Secondary | ICD-10-CM | POA: Diagnosis not present

## 2022-07-02 LAB — CBC WITH DIFFERENTIAL/PLATELET
Basophils Absolute: 0 10*3/uL (ref 0.0–0.1)
Basophils Relative: 0.6 % (ref 0.0–3.0)
Eosinophils Absolute: 0.1 10*3/uL (ref 0.0–0.7)
Eosinophils Relative: 2.8 % (ref 0.0–5.0)
HCT: 35.6 % — ABNORMAL LOW (ref 36.0–46.0)
Hemoglobin: 11.2 g/dL — ABNORMAL LOW (ref 12.0–15.0)
Lymphocytes Relative: 12.3 % (ref 12.0–46.0)
Lymphs Abs: 0.6 10*3/uL — ABNORMAL LOW (ref 0.7–4.0)
MCHC: 31.4 g/dL (ref 30.0–36.0)
MCV: 69 fl — ABNORMAL LOW (ref 78.0–100.0)
Monocytes Absolute: 0.5 10*3/uL (ref 0.1–1.0)
Monocytes Relative: 9.6 % (ref 3.0–12.0)
Neutro Abs: 3.9 10*3/uL (ref 1.4–7.7)
Neutrophils Relative %: 74.7 % (ref 43.0–77.0)
Platelets: 378 10*3/uL (ref 150.0–400.0)
RBC: 5.15 Mil/uL — ABNORMAL HIGH (ref 3.87–5.11)
RDW: 18 % — ABNORMAL HIGH (ref 11.5–15.5)
WBC: 5.2 10*3/uL (ref 4.0–10.5)

## 2022-07-02 LAB — IBC + FERRITIN
Ferritin: 21.5 ng/mL (ref 10.0–291.0)
Iron: 62 ug/dL (ref 42–145)
Saturation Ratios: 17.6 % — ABNORMAL LOW (ref 20.0–50.0)
TIBC: 352.8 ug/dL (ref 250.0–450.0)
Transferrin: 252 mg/dL (ref 212.0–360.0)

## 2022-07-02 MED ORDER — DICYCLOMINE HCL 10 MG PO CAPS: ORAL_CAPSULE | ORAL | 2 refills | Status: DC

## 2022-07-02 NOTE — Progress Notes (Signed)
   Subjective:    Patient ID: Kathy [name], female    DOB: 1955/07/24, 67 y.o.   MRN: [id number]  HPI Kathy [name] is a 67 year old female with a history of Crohn's disease of the colon and anorectum with prior fistula on infliximab (started April 2022), hypertension, diabetes, hyperlipidemia and asthma who is here for follow-up.  She is here alone today and was last seen in the office on 11/04/2021.    She reports that she continues to have issues with what is predominantly constipation.  She has days where her bowels do not move well and she feels more abdominal bloating and discomfort.  Overall her appetite is decreased and she only eats about 1-1/2 meals a day.  She is not having much blood in stool.  She is using Amitiza 8 mcg twice daily but not MiraLAX daily.  She does continue omeprazole.  Last infliximab was 06/17/2022 with next dose scheduled for 07/29/22  She had flex sig with dilation of the anal canal which helped pass her stools for about 6 weeks.    Review of Systems As per HPI, otherwise negative  Current Medications, Allergies, Past Medical History, Past Surgical History, Family History and Social History were reviewed in [employer] record.    Objective:   Physical Exam BP (!) 144/88 (BP Location: Left Arm, Patient Position: Sitting, Cuff Size: Large)   Pulse 80   Ht 4\' 10"  (1.473 m)   Wt 212 lb 2 oz (96.2 kg)   BMI 44.33 kg/m  Gen: awake, alert, NAD HEENT: anicteric  Neuro: nonfocal  Recent labs reviewed, scanned into the chart.  Mild anemia with hemoglobin 10.7, MCV slightly low at 79 Normal liver enzymes     Assessment & Plan:  67 year old female with a history of Crohn's disease of the colon and anorectum with prior fistula on infliximab (started April 2022), hypertension, diabetes, hyperlipidemia and asthma who is here for follow-up.  Crohn's colitis left-sided with perianal involvement/prior fistula/anal stenosis --manual  dilation under anesthesia in May 2023.  She continues to have predominantly constipation which is exacerbated by anal stenosis.  For the most part the activity of her Crohn's disease is mild and much much better on Remicade and azathioprine -- Continue infliximab 10 mg/kg every 6 weeks -- Check Remicade levels today -- Continue azathioprine 100 mg daily -- Bentyl 10 to 20 mg 3 times a day as needed -- Treat constipation  2.  Constipation exacerbated by anal stenosis -- Continue Amitiza 8 mcg twice daily -- I encouraged her to add MiraLAX on a daily basis without interruption -- Avoid Lomotil  3.  Mild anemia with microcytosis --repeat CBC and check iron counts today -- Avoid oral iron given constipation if iron is low consider IV iron  4.  GERD with history of esophagitis --continue omeprazole 40 mg daily  3 to 26-month follow-up with me  40 minutes total spent today including patient facing time, coordination of care, reviewing medical history/procedures/pertinent radiology studies, and documentation of the encounter.

## 2022-07-02 NOTE — Patient Instructions (Addendum)
_______________________________________________________  If your blood pressure at your visit was 140/90 or greater, please contact your primary care physician to follow up on this.  _______________________________________________________  If you are age 69 or older, your body mass index should be between 23-30. Your Body mass index is 44.33 kg/m. If this is out of the aforementioned range listed, please consider follow up with your Primary Care Provider.  If you are age 41 or younger, your body mass index should be between 19-25. Your Body mass index is 44.33 kg/m. If this is out of the aformentioned range listed, please consider follow up with your Primary Care Provider.   ________________________________________________________  The [facility] providers would like to encourage you to use [facility] to communicate with providers for non-urgent requests or questions.  Due to long hold times on the telephone, sending your provider a message by [facility] may be a faster and more efficient way to get a response.  Please allow 48 business hours for a response.  Please remember that this is for non-urgent requests.  _______________________________________________________  Your provider has requested that you go to the basement level for lab work before leaving today. Press "B" on the elevator. The lab is located at the first door on the left as you exit the elevator.  Continue Dicyclomine   Take Miralax daily.   Continue your Omeprazole , Azathioprine, and Amitiza   Discontinue your Lomotil

## 2022-07-06 ENCOUNTER — Ambulatory Visit (INDEPENDENT_AMBULATORY_CARE_PROVIDER_SITE_OTHER): Payer: PPO | Admitting: Family

## 2022-07-06 VITALS — BP 146/59 | HR 71 | Temp 98.0°F | Resp 16 | Wt 215.0 lb

## 2022-07-06 DIAGNOSIS — N1832 Chronic kidney disease, stage 3b: Secondary | ICD-10-CM | POA: Diagnosis not present

## 2022-07-06 DIAGNOSIS — E1165 Type 2 diabetes mellitus with hyperglycemia: Secondary | ICD-10-CM | POA: Diagnosis not present

## 2022-07-06 DIAGNOSIS — K581 Irritable bowel syndrome with constipation: Secondary | ICD-10-CM | POA: Diagnosis not present

## 2022-07-06 DIAGNOSIS — E119 Type 2 diabetes mellitus without complications: Secondary | ICD-10-CM | POA: Diagnosis not present

## 2022-07-06 DIAGNOSIS — G43009 Migraine without aura, not intractable, without status migrainosus: Secondary | ICD-10-CM | POA: Diagnosis not present

## 2022-07-06 DIAGNOSIS — E7800 Pure hypercholesterolemia, unspecified: Secondary | ICD-10-CM | POA: Diagnosis not present

## 2022-07-06 DIAGNOSIS — I10 Essential (primary) hypertension: Secondary | ICD-10-CM | POA: Diagnosis not present

## 2022-07-06 DIAGNOSIS — Z8679 Personal history of other diseases of the circulatory system: Secondary | ICD-10-CM

## 2022-07-06 LAB — COMPREHENSIVE METABOLIC PANEL
ALT: 10 U/L (ref 0–35)
AST: 12 U/L (ref 0–37)
Albumin: 3.7 g/dL (ref 3.5–5.2)
Alkaline Phosphatase: 112 U/L (ref 39–117)
BUN: 16 mg/dL (ref 6–23)
CO2: 27 mEq/L (ref 19–32)
Calcium: 9.7 mg/dL (ref 8.4–10.5)
Chloride: 102 mEq/L (ref 96–112)
Creatinine, Ser: 1.3 mg/dL — ABNORMAL HIGH (ref 0.40–1.20)
GFR: 42.12 mL/min — ABNORMAL LOW (ref >60.00)
Glucose, Bld: 167 mg/dL — ABNORMAL HIGH (ref 70–99)
Potassium: 4.7 mEq/L (ref 3.5–5.1)
Sodium: 138 mEq/L (ref 135–145)
Total Bilirubin: 0.3 mg/dL (ref 0.2–1.2)
Total Protein: 7.8 g/dL (ref 6.0–8.3)

## 2022-07-06 LAB — MICROALBUMIN / CREATININE URINE RATIO
Creatinine,U: 238.6 mg/dL
Microalb Creat Ratio: 4 mg/g (ref 0.0–30.0)
Microalb, Ur: 9.5 mg/dL — ABNORMAL HIGH (ref 0.0–1.9)

## 2022-07-06 LAB — HEMOGLOBIN A1C: Hgb A1c MFr Bld: 6.6 % — ABNORMAL HIGH (ref 4.6–6.5)

## 2022-07-06 MED ORDER — FREESTYLE LIBRE 14 DAY READER DEVI: 0 refills | Status: DC

## 2022-07-06 MED ORDER — HYDROCHLOROTHIAZIDE 12.5 MG PO CAPS: 12.5000 mg | ORAL_CAPSULE | Freq: Every day | ORAL | 1 refills | Status: DC

## 2022-07-06 NOTE — Assessment & Plan Note (Signed)
LDL at goal. Continue current dose of simvastatin.

## 2022-07-06 NOTE — Assessment & Plan Note (Signed)
Maintained on amitiza per GI.  She continues to struggle with constipation alternating with diarrhea. Defer management to GI.

## 2022-07-06 NOTE — Assessment & Plan Note (Signed)
Last A1C was improved. Continue Januvia and glipizide.

## 2022-07-06 NOTE — Assessment & Plan Note (Signed)
No recent migraines. Monitor.  ?

## 2022-07-06 NOTE — Assessment & Plan Note (Signed)
She is now followed at [facility] (Dr. Candiss Norse).  They recently treated her for UTI.

## 2022-07-06 NOTE — Progress Notes (Signed)
Subjective:     Patient ID: Kathy [name], female    DOB: 1955/08/24, 67 y.o.   MRN: [id number]  Chief Complaint  Patient presents with   Hypertension    Here for follow up   Diabetes    Here for follow up    HPI Patient is in today for follow up.  DM2-  Lab Results  Component Value Date   HGBA1C 7.0 (H) 03/10/2022   HGBA1C 7.7 (H) 10/28/2021   HGBA1C 7.8 (H) 07/08/2021   Lab Results  Component Value Date   MICROALBUR 2.8 (H) 04/06/2021   LDLCALC 69 10/28/2021   CREATININE 1.43 (H) 03/10/2022    HTN-  maintained on toprol xl, amlodipine, hctz, valsartan. Last visit her bp was low and we discontinued hctz.    BP Readings from Last 3 Encounters:  07/06/22 (!) 146/59  07/02/22 (!) 144/88  06/17/22 (!) 140/79   B12 deficiency- on oral supplement.  Hyperlipidemia-  Lab Results  Component Value Date   CHOL 127 10/28/2021   HDL 35.80 (L) 10/28/2021   LDLCALC 69 10/28/2021   TRIG 112.0 10/28/2021   CHOLHDL 4 10/28/2021   On statin.   Notes some GI discomfort.  Notes decrease in stamina and energy.    Declines prevnar 20 today.   GI has not scheduled colonoscopy yet.    Health Maintenance Due  Topic Date Due   Pneumonia Vaccine 40+ Years old (1 of 1 - PCV) Never done   Medicare Annual Wellness (AWV)  10/11/2019   COLONOSCOPY (Pts 45-48yr Insurance coverage will need to be confirmed)  05/22/2021   Diabetic kidney evaluation - Urine ACR  04/06/2022   COVID-19 Vaccine (5 - 2023-24 season) 04/26/2022    Past Medical History:  Diagnosis Date   Anal stenosis    Asthma    09-23-2017  per pt has not done inhaler since May 2018, no insurance (QVAR bid and Ventolin prn)   B12 deficiency 01/05/2021   Crohn disease ([place]    Diastolic dysfunction    per echo [id number] grade 2 diastolic dysfunction , ef 60-65%   Esophagitis    GERD (gastroesophageal reflux disease)    Hemorrhoids    Hiatal hernia    Hyperlipidemia    stopped meds due to side effects    Hypertension    09-23-2017  per pt has takes bp meds since May 2018 no insurance (losartan '100mg'$  qd, HCTZ 12.'5mg'$  qd, and toprol 50 qd)   Microalbuminuria 06/01/2006   Qualifier: Diagnosis of  By: PPosey ProntoMD, Sejal     Type 2 diabetes mellitus ([facility]    09-23-2017 per pt has not taken diabetic meds since May 2018, no insurance (kombiglyze 5-'1000mg'$  qd)    Past Surgical History:  Procedure Laterality Date   BREAST BIOPSY Bilateral    CARDIOVASCULAR STRESS TEST  07/29/2009   normal nuclear study w/ no ischemia/  normal LV function and wall motion,  ef 70%   CESAREAN SECTION  1980s   RECTAL BIOPSY N/A 10/03/2017   Procedure: POSSIBLE BIOPSY RECTAL;  Surgeon: WIleana Roup MD;  Location: [facility];  Service: General;  Laterality: N/A;    Family History  Problem Relation Age of Onset   Hypertension Mother 721  Stroke Mother 775  Diabetes Maternal Aunt        s/p bilater amputation due to DM   Asthma Father    Colon cancer Neg Hx    Esophageal cancer Neg Hx  Stomach cancer Neg Hx    Rectal cancer Neg Hx     Social History   Socioeconomic History   Marital status: Married    Spouse name: Not on file   Number of children: 2   Years of education: Not on file   Highest education level: Not on file  Occupational History   Occupation: unemployed  Tobacco Use   Smoking status: Never   Smokeless tobacco: Never  Vaping Use   Vaping Use: Never used  Substance and Sexual Activity   Alcohol use: No   Drug use: No   Sexual activity: Not Currently  Other Topics Concern   Not on file  Social History Narrative   Not working currently. Retired in 2019. Previously worked in [employer] job   Married    2 children (son and daughter). They are both local   She has 1 grand-daughter and 1 grand-son   No pets (allergic)   Completed 4 years of college.   Social Determinants of Health   Financial Resource Strain: Not on file  Food Insecurity: Not on file  Transportation Needs:  Not on file  Physical Activity: Not on file  Stress: Not on file  Social Connections: Not on file  Intimate Partner Violence: Not on file    Outpatient Medications Prior to Visit  Medication Sig Dispense Refill   albuterol (VENTOLIN HFA) 108 (90 Base) MCG/ACT inhaler Inhale 1 puff into the lungs every 6 (six) hours as needed for wheezing or shortness of breath. 18 g 5   amLODipine (NORVASC) 10 MG tablet Take 1 tablet by mouth once daily 90 tablet 0   azaTHIOprine (IMURAN) 50 MG tablet Take 2 tablets (100 mg total) by mouth daily. 60 tablet 0   clotrimazole-betamethasone (LOTRISONE) cream Apply 1 application topically 2 (two) times daily. 30 g 1   colchicine 0.6 MG tablet Take 2 tabs by mouth now and 1 tab in 1 hour. 6 tablet 1   Continuous Blood Gluc Sensor (FREESTYLE LIBRE 14 DAY SENSOR) MISC Change every 14 days. 6 each 5   COVID-19 mRNA vaccine 2023-2024 (COMIRNATY) syringe Inject into the muscle. 0.3 mL 0   dicyclomine (BENTYL) 10 MG capsule TAKE 1 CAPSULE BY MOUTH THREE TIMES DAILY AS NEEDED FOR  CRAMPING 90 capsule 2   glipiZIDE (GLUCOTROL XL) 10 MG 24 hr tablet Take 1 tablet by mouth once daily with breakfast 90 tablet 1   hydrocortisone (ANUSOL-HC) 25 MG suppository Place 1 suppository (25 mg total) rectally at bedtime. 14 suppository 0   influenza vaccine adjuvanted (FLUAD QUADRIVALENT) 0.5 ML injection Inject into the muscle. 0.5 mL 0   lubiprostone (AMITIZA) 8 MCG capsule TAKE 1 CAPSULE BY MOUTH TWICE DAILY WITH A MEAL 60 capsule 1   metoprolol succinate (TOPROL-XL) 50 MG 24 hr tablet Take 1 tablet by mouth once daily 30 tablet 0   Multiple Vitamins-Minerals (MULTIVITAMIN WITH MINERALS) tablet Take 1 tablet by mouth daily.     omeprazole (PRILOSEC) 40 MG capsule Take 1 capsule by mouth once daily 90 capsule 0   pioglitazone (ACTOS) 30 MG tablet Take 1 tablet (30 mg total) by mouth daily. 90 tablet 1   polyethylene glycol (MIRALAX / GLYCOLAX) 17 g packet Take 17 g by mouth as  needed. 1 capful prn 30 each 11   REMICADE 100 MG injection Inject 1,000 mg into the vein every 6 (six) weeks.     simvastatin (ZOCOR) 10 MG tablet Take 1 tablet by mouth once daily  90 tablet 0   sitaGLIPtin (JANUVIA) 50 MG tablet Take 1 tablet by mouth once daily 30 tablet 0   valsartan (DIOVAN) 40 MG tablet Take 1 tablet by mouth once daily 90 tablet 0   vitamin B-12 (CYANOCOBALAMIN) 1000 MCG tablet Take 1 tablet (1,000 mcg total) by mouth daily. 1 tablet 0   hydrochlorothiazide (MICROZIDE) 12.5 MG capsule Take 1 capsule by mouth once daily 90 capsule 0   No facility-administered medications prior to visit.    Allergies  Allergen Reactions   Metformin Nausea Only   Lipitor [Atorvastatin Calcium]     Leg cramps    Lisinopril Itching   Pravachol [Pravastatin Sodium]    Pravastatin Sodium Other (See Comments)    leg cramps    ROS See HPI    Objective:    Physical Exam Constitutional:      General: She is not in acute distress.    Appearance: Normal appearance. She is well-developed.  HENT:     Head: Normocephalic and atraumatic.     Right Ear: External ear normal.     Left Ear: External ear normal.  Eyes:     General: No scleral icterus. Neck:     Thyroid: No thyromegaly.  Cardiovascular:     Rate and Rhythm: Normal rate and regular rhythm.     Heart sounds: Normal heart sounds. No murmur heard. Pulmonary:     Effort: Pulmonary effort is normal. No respiratory distress.     Breath sounds: Normal breath sounds. No wheezing.  Musculoskeletal:     Cervical back: Neck supple.  Skin:    General: Skin is warm and dry.  Neurological:     Mental Status: She is alert and oriented to person, place, and time.  Psychiatric:        Mood and Affect: Mood normal.        Behavior: Behavior normal.        Thought Content: Thought content normal.        Judgment: Judgment normal.     BP (!) 146/59 (BP Location: Right Arm, Patient Position: Sitting, Cuff Size: Large)   Pulse  71   Temp 98 F (36.7 C) (Oral)   Resp 16   Wt 215 lb (97.5 kg)   SpO2 100%   BMI 44.94 kg/m  Wt Readings from Last 3 Encounters:  07/06/22 215 lb (97.5 kg)  07/02/22 212 lb 2 oz (96.2 kg)  06/17/22 215 lb 3.2 oz (97.6 kg)       Assessment & Plan:   Problem List Items Addressed This Visit       Unprioritized   Hyperlipidemia (Chronic)    LDL at goal. Continue current dose of simvastatin.       Relevant Medications   hydrochlorothiazide (MICROZIDE) 12.5 MG capsule   Essential hypertension (Chronic)    Above goal. Restart hctz. Continue metoprolol, amlodipine, diovan.       Relevant Medications   hydrochlorothiazide (MICROZIDE) 12.5 MG capsule   Uncontrolled type 2 diabetes mellitus with hyperglycemia (HCC) - Primary   Migraine without aura    No recent migraines. Monitor.       Relevant Medications   hydrochlorothiazide (MICROZIDE) 12.5 MG capsule   Irritable bowel syndrome with constipation    Maintained on amitiza per GI.  She continues to struggle with constipation alternating with diarrhea. Defer management to GI.      Controlled type 2 diabetes mellitus without complication, without long-term current use of insulin ([place])  Last A1C was improved. Continue Januvia and glipizide.       Relevant Orders   HgB A1c   Urine Microalbumin w/creat. ratio   Comp Met (CMET)   Chronic kidney disease, stage 3b ([place])    She is now followed at [facility] (Dr. Candiss Norse).  They recently treated her for UTI.        Other Visit Diagnoses     History of diastolic dysfunction       Relevant Medications   hydrochlorothiazide (MICROZIDE) 12.5 MG capsule       I have changed Dlisa Smith-Milliken's hydrochlorothiazide. I am also having her start on FreeStyle Libre 14 Day Reader. Additionally, I am having her maintain her polyethylene glycol, clotrimazole-betamethasone, hydrocortisone, multivitamin with minerals, colchicine, cyanocobalamin, pioglitazone,  glipiZIDE, Fluad Quadrivalent, Comirnaty, albuterol, omeprazole, lubiprostone, FreeStyle Libre 14 Day Sensor, simvastatin, amLODipine, valsartan, Januvia, metoprolol succinate, azaTHIOprine, Remicade, and dicyclomine.  Meds ordered this encounter  Medications   Continuous Blood Gluc Receiver (FREESTYLE LIBRE 14 DAY READER) DEVI    Sig: Use as directed    Dispense:  1 each    Refill:  0    Order Specific Question:   Supervising Provider    Answer:   Penni Homans A [[id number]]   hydrochlorothiazide (MICROZIDE) 12.5 MG capsule    Sig: Take 1 capsule (12.5 mg total) by mouth daily.    Dispense:  90 capsule    Refill:  1    Order Specific Question:   Supervising Provider    Answer:   Penni Homans A [[id number]]

## 2022-07-06 NOTE — Assessment & Plan Note (Signed)
Above goal. Restart hctz. Continue metoprolol, amlodipine, diovan.

## 2022-07-06 NOTE — Progress Notes (Signed)
Subjective:   By signing my name below, I, Madelin Rear, attest that this documentation has been prepared under the direction and in the presence of [name], NP. 07/06/2022.   Patient ID: Kathy [name], female    DOB: [birth year], 67 y.o.   MRN: [id number]  No chief complaint on file.   HPI Patient is in today for an office visit.  DM2: Requested Dexcom G7 monitor at her last visit.  Blood Pressure:  Past Medical History:  Diagnosis Date   Anal stenosis    Asthma    09-23-2017  per pt has not done inhaler since May 2018, no insurance (QVAR bid and Ventolin prn)   B12 deficiency 01/05/2021   Crohn disease ([place])    Diastolic dysfunction    per echo [id number]  grade 2 diastolic dysfunction , ef 60-65%   Esophagitis    GERD (gastroesophageal reflux disease)    Hemorrhoids    Hiatal hernia    Hyperlipidemia    stopped meds due to side effects   Hypertension    09-23-2017  per pt has takes bp meds since May 2018 no insurance (losartan '100mg'$  qd, HCTZ 12.'5mg'$  qd, and toprol 50 qd)   Microalbuminuria 06/01/2006   Qualifier: Diagnosis of  By: Posey Pronto MD, Sejal     Type 2 diabetes mellitus ([place])    09-23-2017 per pt has not taken diabetic meds since May 2018, no insurance (kombiglyze 5-'1000mg'$  qd)    Past Surgical History:  Procedure Laterality Date   BREAST BIOPSY Bilateral    CARDIOVASCULAR STRESS TEST  07/29/2009   normal nuclear study w/ no ischemia/  normal LV function and wall motion,  ef 70%   CESAREAN SECTION  1980s   RECTAL BIOPSY N/A 10/03/2017   Procedure: POSSIBLE BIOPSY RECTAL;  Surgeon: Ileana Roup, MD;  Location: [facility];  Service: General;  Laterality: N/A;    Family History  Problem Relation Age of Onset   Hypertension Mother 19   Stroke Mother 51   Diabetes Maternal Aunt        s/p bilater amputation due to DM   Asthma Father    Colon cancer Neg Hx    Esophageal cancer Neg Hx    Stomach cancer Neg Hx    Rectal cancer Neg Hx      Social History   Socioeconomic History   Marital status: Married    Spouse name: Not on file   Number of children: 2   Years of education: Not on file   Highest education level: Not on file  Occupational History   Occupation: unemployed  Tobacco Use   Smoking status: Never   Smokeless tobacco: Never  Vaping Use   Vaping Use: Never used  Substance and Sexual Activity   Alcohol use: No   Drug use: No   Sexual activity: Not Currently  Other Topics Concern   Not on file  Social History Narrative   Not working currently. Retired in 2019. Previously worked in [employer] job   Married    2 children (son and daughter). They are both local   She has 1 grand-daughter and 1 grand-son   No pets (allergic)   Completed 4 years of college.   Social Determinants of Health   Financial Resource Strain: Not on file  Food Insecurity: Not on file  Transportation Needs: Not on file  Physical Activity: Not on file  Stress: Not on file  Social Connections: Not on file  Intimate Partner Violence: Not  on file    Outpatient Medications Prior to Visit  Medication Sig Dispense Refill   albuterol (VENTOLIN HFA) 108 (90 Base) MCG/ACT inhaler Inhale 1 puff into the lungs every 6 (six) hours as needed for wheezing or shortness of breath. 18 g 5   amLODipine (NORVASC) 10 MG tablet Take 1 tablet by mouth once daily 90 tablet 0   azaTHIOprine (IMURAN) 50 MG tablet Take 2 tablets (100 mg total) by mouth daily. 60 tablet 0   clotrimazole-betamethasone (LOTRISONE) cream Apply 1 application topically 2 (two) times daily. 30 g 1   colchicine 0.6 MG tablet Take 2 tabs by mouth now and 1 tab in 1 hour. 6 tablet 1   Continuous Blood Gluc Sensor (FREESTYLE LIBRE 14 DAY SENSOR) MISC Change every 14 days. 6 each 5   COVID-19 mRNA vaccine 2023-2024 (COMIRNATY) syringe Inject into the muscle. 0.3 mL 0   dicyclomine (BENTYL) 10 MG capsule TAKE 1 CAPSULE BY MOUTH THREE TIMES DAILY AS NEEDED FOR  CRAMPING 90  capsule 2   glipiZIDE (GLUCOTROL XL) 10 MG 24 hr tablet Take 1 tablet by mouth once daily with breakfast 90 tablet 1   hydrochlorothiazide (MICROZIDE) 12.5 MG capsule Take 1 capsule by mouth once daily 90 capsule 0   hydrocortisone (ANUSOL-HC) 25 MG suppository Place 1 suppository (25 mg total) rectally at bedtime. 14 suppository 0   influenza vaccine adjuvanted (FLUAD QUADRIVALENT) 0.5 ML injection Inject into the muscle. 0.5 mL 0   lubiprostone (AMITIZA) 8 MCG capsule TAKE 1 CAPSULE BY MOUTH TWICE DAILY WITH A MEAL 60 capsule 1   metoprolol succinate (TOPROL-XL) 50 MG 24 hr tablet Take 1 tablet by mouth once daily 30 tablet 0   Multiple Vitamins-Minerals (MULTIVITAMIN WITH MINERALS) tablet Take 1 tablet by mouth daily.     omeprazole (PRILOSEC) 40 MG capsule Take 1 capsule by mouth once daily 90 capsule 0   pioglitazone (ACTOS) 30 MG tablet Take 1 tablet (30 mg total) by mouth daily. 90 tablet 1   polyethylene glycol (MIRALAX / GLYCOLAX) 17 g packet Take 17 g by mouth as needed. 1 capful prn 30 each 11   REMICADE 100 MG injection Inject 1,000 mg into the vein every 6 (six) weeks.     simvastatin (ZOCOR) 10 MG tablet Take 1 tablet by mouth once daily 90 tablet 0   sitaGLIPtin (JANUVIA) 50 MG tablet Take 1 tablet by mouth once daily 30 tablet 0   valsartan (DIOVAN) 40 MG tablet Take 1 tablet by mouth once daily 90 tablet 0   vitamin B-12 (CYANOCOBALAMIN) 1000 MCG tablet Take 1 tablet (1,000 mcg total) by mouth daily. 1 tablet 0   No facility-administered medications prior to visit.    Allergies  Allergen Reactions   Metformin Nausea Only   Lipitor [Atorvastatin Calcium]     Leg cramps    Lisinopril Itching   Pravachol [Pravastatin Sodium]    Pravastatin Sodium Other (See Comments)    leg cramps    ROS     Objective:    Physical Exam Constitutional:      Appearance: Normal appearance.  HENT:     Head: Normocephalic and atraumatic.     Right Ear: Tympanic membrane, ear canal  and external ear normal.     Left Ear: Tympanic membrane, ear canal and external ear normal.  Eyes:     Extraocular Movements: Extraocular movements intact.     Pupils: Pupils are equal, round, and reactive to light.  Cardiovascular:  Rate and Rhythm: Normal rate and regular rhythm.     Heart sounds: Normal heart sounds. No murmur heard.    No gallop.  Pulmonary:     Effort: Pulmonary effort is normal. No respiratory distress.     Breath sounds: Normal breath sounds. No wheezing or rales.  Skin:    General: Skin is warm and dry.  Neurological:     General: No focal deficit present.     Mental Status: She is alert and oriented to person, place, and time.  Psychiatric:        Mood and Affect: Mood normal.        Behavior: Behavior normal.     There were no vitals taken for this visit. Wt Readings from Last 3 Encounters:  07/02/22 212 lb 2 oz (96.2 kg)  06/17/22 215 lb 3.2 oz (97.6 kg)  05/06/22 216 lb (98 kg)    Diabetic Foot Exam - Simple   No data filed    Lab Results  Component Value Date   WBC 5.2 07/02/2022   HGB 11.2 (L) 07/02/2022   HCT 35.6 (L) 07/02/2022   PLT 378.0 07/02/2022   GLUCOSE 162 (H) 03/10/2022   CHOL 127 10/28/2021   TRIG 112.0 10/28/2021   HDL 35.80 (L) 10/28/2021   LDLCALC 69 10/28/2021   ALT 9 08/11/2021   AST 11 08/11/2021   NA 138 03/10/2022   K 4.0 03/10/2022   CL 98 03/10/2022   CREATININE 1.43 (H) 03/10/2022   BUN 20 03/10/2022   CO2 32 03/10/2022   TSH 2.89 02/04/2020   HGBA1C 7.0 (H) 03/10/2022   MICROALBUR 2.8 (H) 04/06/2021    Lab Results  Component Value Date   TSH 2.89 02/04/2020   Lab Results  Component Value Date   WBC 5.2 07/02/2022   HGB 11.2 (L) 07/02/2022   HCT 35.6 (L) 07/02/2022   MCV 69.0 (L) 07/02/2022   PLT 378.0 07/02/2022   Lab Results  Component Value Date   NA 138 03/10/2022   K 4.0 03/10/2022   CO2 32 03/10/2022   GLUCOSE 162 (H) 03/10/2022   BUN 20 03/10/2022   CREATININE 1.43 (H)  03/10/2022   BILITOT 0.4 08/11/2021   ALKPHOS 106 08/11/2021   AST 11 08/11/2021   ALT 9 08/11/2021   PROT 8.9 (H) 08/11/2021   ALBUMIN 4.0 08/11/2021   CALCIUM 9.6 03/10/2022   ANIONGAP 8 10/03/2017   GFR 37.66 (L) 03/10/2022   Lab Results  Component Value Date   CHOL 127 10/28/2021   Lab Results  Component Value Date   HDL 35.80 (L) 10/28/2021   Lab Results  Component Value Date   LDLCALC 69 10/28/2021   Lab Results  Component Value Date   TRIG 112.0 10/28/2021   Lab Results  Component Value Date   CHOLHDL 4 10/28/2021   Lab Results  Component Value Date   HGBA1C 7.0 (H) 03/10/2022       Assessment & Plan:   Problem List Items Addressed This Visit   None    No orders of the defined types were placed in this encounter.   Alphonzo Grieve, personally preformed the services described in this documentation.  All medical record entries made by the scribe were at my direction and in my presence.  I have reviewed the chart and discharge instructions (if applicable) and agree that the record reflects my personal performance and is accurate and complete.  07/06/2022.  I,Mathew Stumpf,acting as a Education administrator for [employer]  Rowe Pavy, NP.,have documented all relevant documentation on the behalf of Nance Pear, NP,as directed by  Nance Pear, NP while in the presence of Nance Pear, NP.   Madelin Rear

## 2022-07-09 LAB — SERIAL MONITORING

## 2022-07-10 LAB — INFLIXIMAB+AB (SERIAL MONITOR)
Anti-Infliximab Antibody: <22 ng/mL
Infliximab Drug Level: 65 ug/mL

## 2022-07-19 ENCOUNTER — Other Ambulatory Visit: Payer: Self-pay | Admitting: Family

## 2022-07-19 DIAGNOSIS — E119 Type 2 diabetes mellitus without complications: Secondary | ICD-10-CM

## 2022-07-29 ENCOUNTER — Ambulatory Visit (INDEPENDENT_AMBULATORY_CARE_PROVIDER_SITE_OTHER): Payer: PPO

## 2022-07-29 VITALS — BP 111/70 | HR 62 | Temp 97.7°F | Resp 18 | Ht <= 58 in | Wt 209.0 lb

## 2022-07-29 DIAGNOSIS — K50111 Crohn's disease of large intestine with rectal bleeding: Secondary | ICD-10-CM

## 2022-07-29 MED ORDER — DIPHENHYDRAMINE HCL 25 MG PO CAPS
25.0000 mg | ORAL_CAPSULE | Freq: Once | ORAL | Status: AC
  Administered 2022-07-29: 25 mg via ORAL
  Filled 2022-07-29: qty 1

## 2022-07-29 MED ORDER — ACETAMINOPHEN 325 MG PO TABS
650.0000 mg | ORAL_TABLET | Freq: Once | ORAL | Status: AC
  Administered 2022-07-29: 650 mg via ORAL
  Filled 2022-07-29: qty 2

## 2022-07-29 MED ORDER — SODIUM CHLORIDE 0.9 % IV SOLN
10.0000 mg/kg | Freq: Once | INTRAVENOUS | Status: AC
  Administered 2022-07-29: 1000 mg via INTRAVENOUS
  Filled 2022-07-29: qty 100

## 2022-07-29 NOTE — Progress Notes (Signed)
Diagnosis: Crohn's Disease  Provider:  Marshell Garfinkel MD  Procedure: Infusion  IV Type: Peripheral, IV Location: R Antecubital  Remicade (Infliximab), Dose: 1000 mg  Infusion Start Time: [id number]  Infusion Stop Time: 1331  Post Infusion IV Care: Peripheral IV Discontinued  Discharge: Condition: Good, Destination: Home . AVS Provided  Performed by:  Adelina Mings, LPN

## 2022-08-02 ENCOUNTER — Other Ambulatory Visit: Payer: Self-pay | Admitting: Internal Medicine

## 2022-08-02 ENCOUNTER — Other Ambulatory Visit: Payer: Self-pay | Admitting: Family

## 2022-08-02 DIAGNOSIS — I10 Essential (primary) hypertension: Secondary | ICD-10-CM

## 2022-08-05 ENCOUNTER — Telehealth: Payer: Self-pay | Admitting: Family

## 2022-08-05 NOTE — Telephone Encounter (Signed)
Contacted Kathy [name] to schedule their annual wellness visit. Appointment made for 08/13/2022.  Verlee Rossetti; Care Guide Ambulatory Clinical Support [place] [facility] Direct Dial: [phone]

## 2022-08-13 ENCOUNTER — Ambulatory Visit (INDEPENDENT_AMBULATORY_CARE_PROVIDER_SITE_OTHER): Payer: PPO | Admitting: *Deleted

## 2022-08-13 DIAGNOSIS — Z0000 Encounter for general adult medical examination without abnormal findings: Secondary | ICD-10-CM | POA: Diagnosis not present

## 2022-08-13 NOTE — Patient Instructions (Signed)
Kathy [name] , Thank you for taking time to come for your Medicare Wellness Visit. I appreciate your ongoing commitment to your health goals. Please review the following plan we discussed and let me know if I can assist you in the future.   These are the goals we discussed:  Goals   None     This is a list of the screening recommended for you and due dates:  Health Maintenance  Topic Date Due   Pneumonia Vaccine (1 of 1 - PCV) Never done   Colon Cancer Screening  05/22/2021   COVID-19 Vaccine (5 - 2023-24 season) 04/26/2022   Mammogram  10/08/2022   Flu Shot  11/25/2022   Eye exam for diabetics  11/27/2022   Hemoglobin A1C  01/06/2023   Complete foot exam   03/11/2023   Yearly kidney function blood test for diabetes  07/06/2023   Yearly kidney health urinalysis for diabetes  07/06/2023   Medicare Annual Wellness Visit  08/13/2023   DTaP/Tdap/Td vaccine (3 - Tdap) 10/10/2028   DEXA scan (bone density measurement)  Completed   Hepatitis C Screening: USPSTF Recommendation to screen - Ages 59-79 yo.  Completed   HPV Vaccine  Aged Out   Zoster (Shingles) Vaccine  Discontinued     Next appointment: Follow up in one year for your annual wellness visit.   Preventive Care 59 Years and Older, Female Preventive care refers to lifestyle choices and visits with your health care provider that can promote health and wellness. What does preventive care include? A yearly physical exam. This is also called an annual well check. Dental exams once or twice a year. Routine eye exams. Ask your health care provider how often you should have your eyes checked. Personal lifestyle choices, including: Daily care of your teeth and gums. Regular physical activity. Eating a healthy diet. Avoiding tobacco and drug use. Limiting alcohol use. Practicing safe sex. Taking low-dose aspirin every day. Taking vitamin and mineral supplements as recommended by your health care provider. What happens  during an annual well check? The services and screenings done by your health care provider during your annual well check will depend on your age, overall health, lifestyle risk factors, and family history of disease. Counseling  Your health care provider may ask you questions about your: Alcohol use. Tobacco use. Drug use. Emotional well-being. Home and relationship well-being. Sexual activity. Eating habits. History of falls. Memory and ability to understand (cognition). Work and work Astronomer. Reproductive health. Screening  You may have the following tests or measurements: Height, weight, and BMI. Blood pressure. Lipid and cholesterol levels. These may be checked every 5 years, or more frequently if you are over 69 years old. Skin check. Lung cancer screening. You may have this screening every year starting at age 86 if you have a 30-pack-year history of smoking and currently smoke or have quit within the past 15 years. Fecal occult blood test (FOBT) of the stool. You may have this test every year starting at age 49. Flexible sigmoidoscopy or colonoscopy. You may have a sigmoidoscopy every 5 years or a colonoscopy every 10 years starting at age 33. Hepatitis C blood test. Hepatitis B blood test. Sexually transmitted disease (STD) testing. Diabetes screening. This is done by checking your blood sugar (glucose) after you have not eaten for a while (fasting). You may have this done every 1-3 years. Bone density scan. This is done to screen for osteoporosis. You may have this done starting at age 33. Mammogram.  This may be done every 1-2 years. Talk to your health care provider about how often you should have regular mammograms. Talk with your health care provider about your test results, treatment options, and if necessary, the need for more tests. Vaccines  Your health care provider may recommend certain vaccines, such as: Influenza vaccine. This is recommended every  year. Tetanus, diphtheria, and acellular pertussis (Tdap, Td) vaccine. You may need a Td booster every 10 years. Zoster vaccine. You may need this after age 69. Pneumococcal 13-valent conjugate (PCV13) vaccine. One dose is recommended after age 37. Pneumococcal polysaccharide (PPSV23) vaccine. One dose is recommended after age 26. Talk to your health care provider about which screenings and vaccines you need and how often you need them. This information is not intended to replace advice given to you by your health care provider. Make sure you discuss any questions you have with your health care provider. Document Released: 05/09/2015 Document Revised: 12/31/2015 Document Reviewed: 02/11/2015 Elsevier Interactive Patient Education  2017 [place] Prevention in the Home Falls can cause injuries. They can happen to people of all ages. There are many things you can do to make your home safe and to help prevent falls. What can I do on the outside of my home? Regularly fix the edges of walkways and driveways and fix any cracks. Remove anything that might make you trip as you walk through a door, such as a raised step or threshold. Trim any bushes or trees on the path to your home. Use bright outdoor lighting. Clear any walking paths of anything that might make someone trip, such as rocks or tools. Regularly check to see if handrails are loose or broken. Make sure that both sides of any steps have handrails. Any raised decks and porches should have guardrails on the edges. Have any leaves, snow, or ice cleared regularly. Use sand or salt on walking paths during winter. Clean up any spills in your garage right away. This includes oil or grease spills. What can I do in the bathroom? Use night lights. Install grab bars by the toilet and in the tub and shower. Do not use towel bars as grab bars. Use non-skid mats or decals in the tub or shower. If you need to sit down in the shower, use a  plastic, non-slip stool. Keep the floor dry. Clean up any water that spills on the floor as soon as it happens. Remove soap buildup in the tub or shower regularly. Attach bath mats securely with double-sided non-slip rug tape. Do not have throw rugs and other things on the floor that can make you trip. What can I do in the bedroom? Use night lights. Make sure that you have a light by your bed that is easy to reach. Do not use any sheets or blankets that are too big for your bed. They should not hang down onto the floor. Have a firm chair that has side arms. You can use this for support while you get dressed. Do not have throw rugs and other things on the floor that can make you trip. What can I do in the kitchen? Clean up any spills right away. Avoid walking on wet floors. Keep items that you use a lot in easy-to-reach places. If you need to reach something above you, use a strong step stool that has a grab bar. Keep electrical cords out of the way. Do not use floor polish or wax that makes floors slippery. If you  must use wax, use non-skid floor wax. Do not have throw rugs and other things on the floor that can make you trip. What can I do with my stairs? Do not leave any items on the stairs. Make sure that there are handrails on both sides of the stairs and use them. Fix handrails that are broken or loose. Make sure that handrails are as long as the stairways. Check any carpeting to make sure that it is firmly attached to the stairs. Fix any carpet that is loose or worn. Avoid having throw rugs at the top or bottom of the stairs. If you do have throw rugs, attach them to the floor with carpet tape. Make sure that you have a light switch at the top of the stairs and the bottom of the stairs. If you do not have them, ask someone to add them for you. What else can I do to help prevent falls? Wear shoes that: Do not have high heels. Have rubber bottoms. Are comfortable and fit you  well. Are closed at the toe. Do not wear sandals. If you use a stepladder: Make sure that it is fully opened. Do not climb a closed stepladder. Make sure that both sides of the stepladder are locked into place. Ask someone to hold it for you, if possible. Clearly mark and make sure that you can see: Any grab bars or handrails. First and last steps. Where the edge of each step is. Use tools that help you move around (mobility aids) if they are needed. These include: Canes. Walkers. Scooters. Crutches. Turn on the lights when you go into a dark area. Replace any light bulbs as soon as they burn out. Set up your furniture so you have a clear path. Avoid moving your furniture around. If any of your floors are uneven, fix them. If there are any pets around you, be aware of where they are. Review your medicines with your doctor. Some medicines can make you feel dizzy. This can increase your chance of falling. Ask your doctor what other things that you can do to help prevent falls. This information is not intended to replace advice given to you by your health care provider. Make sure you discuss any questions you have with your health care provider. Document Released: 02/06/2009 Document Revised: 09/18/2015 Document Reviewed: 05/17/2014 Elsevier Interactive Patient Education  2017 [employer].

## 2022-08-13 NOTE — Progress Notes (Signed)
Subjective:   Kathy [name] is a 67 y.o. female who presents for Medicare Annual (Subsequent) preventive examination.  I connected with  Kathy [name] on 08/13/22 by a audio enabled telemedicine application and verified that I am speaking with the correct person using two identifiers.  Patient Location: Home  Provider Location: Office/Clinic  I discussed the limitations of evaluation and management by telemedicine. The patient expressed understanding and agreed to proceed.   Review of Systems     Cardiac Risk Factors include: advanced age (>17men, >60 women);diabetes mellitus;dyslipidemia;hypertension;sedentary lifestyle;obesity (BMI >30kg/m2)     Objective:    There were no vitals filed for this visit. There is no height or weight on file to calculate BMI.     08/13/2022    3:42 PM 03/17/2020    9:11 AM 05/24/2018    9:04 AM 05/19/2018    1:59 PM 03/08/2018   10:16 AM 10/03/2017    6:23 AM  Advanced Directives  Does Patient Have a Medical Advance Directive? No No No No No No  Would patient like information on creating a medical advance directive? No - Patient declined No - Patient declined No - Patient declined No - Patient declined Yes (MAU/Ambulatory/Procedural Areas - Information given) No - Patient declined    Current Medications (verified) Outpatient Encounter Medications as of 08/13/2022  Medication Sig   albuterol (VENTOLIN HFA) 108 (90 Base) MCG/ACT inhaler Inhale 1 puff into the lungs every 6 (six) hours as needed for wheezing or shortness of breath.   amLODipine (NORVASC) 10 MG tablet Take 1 tablet by mouth once daily   azaTHIOprine (IMURAN) 50 MG tablet Take 2 tablets (100 mg total) by mouth daily.   clotrimazole-betamethasone (LOTRISONE) cream Apply 1 application topically 2 (two) times daily.   colchicine 0.6 MG tablet Take 2 tabs by mouth now and 1 tab in 1 hour.   Continuous Blood Gluc Receiver (FREESTYLE LIBRE 14 DAY READER) DEVI Use as directed    Continuous Blood Gluc Sensor (FREESTYLE LIBRE 14 DAY SENSOR) MISC Change every 14 days.   COVID-19 mRNA vaccine 2023-2024 (COMIRNATY) syringe Inject into the muscle.   dicyclomine (BENTYL) 10 MG capsule TAKE 1 CAPSULE BY MOUTH THREE TIMES DAILY AS NEEDED FOR  CRAMPING   glipiZIDE (GLUCOTROL XL) 10 MG 24 hr tablet Take 1 tablet by mouth once daily with breakfast   hydrochlorothiazide (MICROZIDE) 12.5 MG capsule Take 1 capsule (12.5 mg total) by mouth daily.   hydrocortisone (ANUSOL-HC) 25 MG suppository Place 1 suppository (25 mg total) rectally at bedtime.   lubiprostone (AMITIZA) 8 MCG capsule TAKE 1 CAPSULE BY MOUTH TWICE DAILY WITH A MEAL   metoprolol succinate (TOPROL-XL) 50 MG 24 hr tablet Take 1 tablet (50 mg total) by mouth daily. Take with or immediately following a meal   Multiple Vitamins-Minerals (MULTIVITAMIN WITH MINERALS) tablet Take 1 tablet by mouth daily.   omeprazole (PRILOSEC) 40 MG capsule Take 1 capsule by mouth once daily   pioglitazone (ACTOS) 30 MG tablet Take 1 tablet (30 mg total) by mouth daily.   polyethylene glycol (MIRALAX / GLYCOLAX) 17 g packet Take 17 g by mouth as needed. 1 capful prn   REMICADE 100 MG injection Inject 1,000 mg into the vein every 6 (six) weeks.   simvastatin (ZOCOR) 10 MG tablet Take 1 tablet by mouth once daily   sitaGLIPtin (JANUVIA) 50 MG tablet Take 1 tablet (50 mg total) by mouth daily.   valsartan (DIOVAN) 40 MG tablet Take 1 tablet by mouth  once daily   vitamin B-12 (CYANOCOBALAMIN) 1000 MCG tablet Take 1 tablet (1,000 mcg total) by mouth daily.   [DISCONTINUED] influenza vaccine adjuvanted (FLUAD QUADRIVALENT) 0.5 ML injection Inject into the muscle.   No facility-administered encounter medications on file as of 08/13/2022.    Allergies (verified) Metformin, Lipitor [atorvastatin calcium], Lisinopril, Pravachol [pravastatin sodium], and Pravastatin sodium   History: Past Medical History:  Diagnosis Date   Anal stenosis     Asthma    09-23-2017  per pt has not done inhaler since May 2018, no insurance (QVAR bid and Ventolin prn)   B12 deficiency 01/05/2021   Crohn disease    Diastolic dysfunction    per echo 06-23-2009  grade 2 diastolic dysfunction , ef 60-65%   Esophagitis    GERD (gastroesophageal reflux disease)    Hemorrhoids    Hiatal hernia    Hyperlipidemia    stopped meds due to side effects   Hypertension    09-23-2017  per pt has takes bp meds since May 2018 no insurance (losartan 100mg  qd, HCTZ 12.5mg  qd, and toprol 50 qd)   Microalbuminuria 06/01/2006   Qualifier: Diagnosis of  By: Allena Katz MD, Sejal     Type 2 diabetes mellitus    09-23-2017 per pt has not taken diabetic meds since May 2018, no insurance (kombiglyze 5-1000mg  qd)   Past Surgical History:  Procedure Laterality Date   BREAST BIOPSY Bilateral    CARDIOVASCULAR STRESS TEST  07/29/2009   normal nuclear study w/ no ischemia/  normal LV function and wall motion,  ef 70%   CESAREAN SECTION  1980s   RECTAL BIOPSY N/A 10/03/2017   Procedure: POSSIBLE BIOPSY RECTAL;  Surgeon: Andria Meuse, MD;  Location: [facility];  Service: General;  Laterality: N/A;   Family History  Problem Relation Age of Onset   Hypertension Mother 37   Stroke Mother 21   Diabetes Maternal Aunt        s/p bilater amputation due to DM   Asthma Father    Colon cancer Neg Hx    Esophageal cancer Neg Hx    Stomach cancer Neg Hx    Rectal cancer Neg Hx    Social History   Socioeconomic History   Marital status: Married    Spouse name: Not on file   Number of children: 2   Years of education: Not on file   Highest education level: Not on file  Occupational History   Occupation: unemployed  Tobacco Use   Smoking status: Never   Smokeless tobacco: Never  Vaping Use   Vaping Use: Never used  Substance and Sexual Activity   Alcohol use: No   Drug use: No   Sexual activity: Not Currently  Other Topics Concern   Not on file  Social History  Narrative   Not working currently. Retired in 2019. Previously worked in [employer] job   Married    2 children (son and daughter). They are both local   She has 1 grand-daughter and 1 grand-son   No pets (allergic)   Completed 4 years of college.   Social Determinants of Health   Financial Resource Strain: Low Risk  (08/13/2022)   Overall Financial Resource Strain (CARDIA)    Difficulty of Paying Living Expenses: Not hard at all  Food Insecurity: No Food Insecurity (08/13/2022)   Hunger Vital Sign    Worried About Running Out of Food in the Last Year: Never true    Ran Out of Food  in the Last Year: Never true  Transportation Needs: No Transportation Needs (08/13/2022)   PRAPARE - Administrator, Civil Service (Medical): No    Lack of Transportation (Non-Medical): No  Physical Activity: Inactive (08/13/2022)   Exercise Vital Sign    Days of Exercise per Week: 0 days    Minutes of Exercise per Session: 0 min  Stress: No Stress Concern Present (08/13/2022)   [employer] of Occupational Health - Occupational Stress Questionnaire    Feeling of Stress : Only a little  Social Connections: Moderately Integrated (08/13/2022)   Social Connection and Isolation Panel [NHANES]    Frequency of Communication with Friends and Family: More than three times a week    Frequency of Social Gatherings with Friends and Family: Twice a week    Attends Religious Services: More than 4 times per year    Active Member of [employer] or Organizations: No    Attends Engineer, structural: Never    Marital Status: Married    Tobacco Counseling Counseling given: Not Answered   Clinical Intake:  Pre-visit preparation completed: Yes  Pain : No/denies pain  Nutritional Risks: None Diabetes: Yes CBG done?: No Did pt. bring in CBG monitor from home?: No  How often do you need to have someone help you when you read instructions, pamphlets, or other written materials from your doctor or  pharmacy?: 1 - Never   Activities of Daily Living    08/13/2022    3:49 PM  In your present state of health, do you have any difficulty performing the following activities:  Hearing? 0  Vision? 0  Difficulty concentrating or making decisions? 0  Walking or climbing stairs? 1  Comment "sometimes"  Dressing or bathing? 0  Doing errands, shopping? 0  Preparing Food and eating ? N  Using the Toilet? N  In the past six months, have you accidently leaked urine? N  Do you have problems with loss of bowel control? Y  Managing your Medications? N  Managing your Finances? N  Housekeeping or managing your Housekeeping? N    Patient Care Team: Sandford Craze, NP as PCP - General (Internal Medicine)  Indicate any recent Medical Services you may have received from other than [facility] providers in the past year (date may be approximate).     Assessment:   This is a routine wellness examination for Sophy.  Hearing/Vision screen No results found.  Dietary issues and exercise activities discussed: Current Exercise Habits: The patient does not participate in regular exercise at present, Exercise limited by: None identified   Goals Addressed   None    Depression Screen    08/13/2022    3:48 PM 07/06/2022   10:31 AM 03/10/2022    9:23 AM 10/28/2021   10:44 AM 04/06/2021    9:02 AM 03/17/2020    9:12 AM 02/28/2019    8:10 AM  PHQ 2/9 Scores  PHQ - 2 Score 1 0 0 0  0 0  PHQ- 9 Score  0       Exception Documentation     Patient refusal      Fall Risk    08/13/2022    3:43 PM 07/06/2022   10:31 AM 03/10/2022    9:23 AM 04/06/2021    9:06 AM 03/17/2020    9:11 AM  Fall Risk   Falls in the past year? 0 0 0 0 0  Number falls in past yr: 0 0 0 0   Injury  with Fall? 0 0 0 0   Risk for fall due to : No Fall Risks No Fall Risks No Fall Risks    Follow up Falls evaluation completed Falls evaluation completed Falls evaluation completed      FALL RISK PREVENTION PERTAINING TO THE  HOME:  Any stairs in or around the home? No  Home free of loose throw rugs in walkways, pet beds, electrical cords, etc? Yes  Adequate lighting in your home to reduce risk of falls? Yes   ASSISTIVE DEVICES UTILIZED TO PREVENT FALLS:  Life alert? No  Use of a cane, walker or w/c? No  Grab bars in the bathroom? No  Shower chair or bench in shower? No  Elevated toilet seat or a handicapped toilet? No   TIMED UP AND GO:  Was the test performed?  No, audio visit .   Cognitive Function:        08/13/2022    3:53 PM  6CIT Screen  What Year? 0 points  What month? 0 points  What time? 0 points  Count back from 20 0 points  Months in reverse 0 points  Repeat phrase 0 points  Total Score 0 points    Immunizations Immunization History  Administered Date(s) Administered   COVID-19, mRNA, vaccine(Comirnaty)12 years and older 03/01/2022, 03/01/2022   Fluad Quad(high Dose 65+) 01/16/2019, 02/04/2020, 01/05/2021, 02/24/2022   Influenza,inj,Quad PF,6+ Mos 02/09/2018   PFIZER(Purple Top)SARS-COV-2 Vaccination 06/22/2019, 07/17/2019, 02/22/2020   Td 04/08/2005, 10/11/2018    TDAP status: Up to date  Flu Vaccine status: Up to date  Pneumococcal vaccine status: Due, Education has been provided regarding the importance of this vaccine. Advised may receive this vaccine at local pharmacy or Health Dept. Aware to provide a copy of the vaccination record if obtained from local pharmacy or Health Dept. Verbalized acceptance and understanding.  Covid-19 vaccine status: Information provided on how to obtain vaccines.   Qualifies for Shingles Vaccine? Yes   Zostavax completed No   Shingrix Completed?: No.    Education has been provided regarding the importance of this vaccine. Patient has been advised to call insurance company to determine out of pocket expense if they have not yet received this vaccine. Advised may also receive vaccine at local pharmacy or Health Dept. Verbalized acceptance  and understanding.  Screening Tests Health Maintenance  Topic Date Due   Pneumonia Vaccine 55+ Years old (1 of 1 - PCV) Never done   Medicare Annual Wellness (AWV)  10/11/2019   COLONOSCOPY (Pts 45-86yrs Insurance coverage will need to be confirmed)  05/22/2021   COVID-19 Vaccine (5 - 2023-24 season) 04/26/2022   MAMMOGRAM  10/08/2022   INFLUENZA VACCINE  11/25/2022   OPHTHALMOLOGY EXAM  11/27/2022   HEMOGLOBIN A1C  01/06/2023   FOOT EXAM  03/11/2023   Diabetic kidney evaluation - eGFR measurement  07/06/2023   Diabetic kidney evaluation - Urine ACR  07/06/2023   DTaP/Tdap/Td (3 - Tdap) 10/10/2028   DEXA SCAN  Completed   Hepatitis C Screening  Completed   HPV VACCINES  Aged Out   Zoster Vaccines- Shingrix  Discontinued    Health Maintenance  Health Maintenance Due  Topic Date Due   Pneumonia Vaccine 28+ Years old (1 of 1 - PCV) Never done   Medicare Annual Wellness (AWV)  10/11/2019   COLONOSCOPY (Pts 45-59yrs Insurance coverage will need to be confirmed)  05/22/2021   COVID-19 Vaccine (5 - 2023-24 season) 04/26/2022    Colorectal cancer screening: Type of screening: Colonoscopy.  Completed 05/22/20. Repeat every 1 years  Mammogram status: Completed 10/07/20. Repeat every year  Bone Density status: Completed 05/29/19. Results reflect: Bone density results: NORMAL. Repeat every 2 years.  Lung Cancer Screening: (Low Dose CT Chest recommended if Age 58-80 years, 30 pack-year currently smoking OR have quit w/in 15years.) does not qualify.    Additional Screening:  Hepatitis C Screening: does qualify; Completed 05/19/18  Vision Screening: Recommended annual ophthalmology exams for early detection of glaucoma and other disorders of the eye. Is the patient up to date with their annual eye exam?  Yes  Who is the provider or what is the name of the office in which the patient attends annual eye exams? [facility] If pt is not established with a provider, would they like to be  referred to a provider to establish care? No .   Dental Screening: Recommended annual dental exams for proper oral hygiene  Community Resource Referral / Chronic Care Management: CRR required this visit?  No   CCM required this visit?  No      Plan:     I have personally reviewed and noted the following in the patient's chart:   Medical and social history Use of alcohol, tobacco or illicit drugs  Current medications and supplements including opioid prescriptions. Patient is not currently taking opioid prescriptions. Functional ability and status Nutritional status Physical activity Advanced directives List of other physicians Hospitalizations, surgeries, and ER visits in previous 12 months Vitals Screenings to include cognitive, depression, and falls Referrals and appointments  In addition, I have reviewed and discussed with patient certain preventive protocols, quality metrics, and best practice recommendations. A written personalized care plan for preventive services as well as general preventive health recommendations were provided to patient.   Due to this being a telephonic visit, the after visit summary with patients personalized plan was offered to patient via mail or my-chart.  Patient would like to access on my-chart.  Donne Anon, [place]   08/13/2022   Nurse Notes: None

## 2022-08-17 ENCOUNTER — Other Ambulatory Visit: Payer: Self-pay | Admitting: Internal Medicine

## 2022-08-30 ENCOUNTER — Other Ambulatory Visit: Payer: Self-pay | Admitting: Family

## 2022-08-30 ENCOUNTER — Other Ambulatory Visit: Payer: Self-pay | Admitting: Internal Medicine

## 2022-08-30 DIAGNOSIS — I119 Hypertensive heart disease without heart failure: Secondary | ICD-10-CM

## 2022-09-06 ENCOUNTER — Other Ambulatory Visit: Payer: Self-pay | Admitting: Family

## 2022-09-09 ENCOUNTER — Ambulatory Visit (INDEPENDENT_AMBULATORY_CARE_PROVIDER_SITE_OTHER): Payer: PPO

## 2022-09-09 VITALS — BP 114/70 | HR 63 | Temp 98.0°F | Resp 16 | Ht <= 58 in | Wt 210.6 lb

## 2022-09-09 DIAGNOSIS — K50111 Crohn's disease of large intestine with rectal bleeding: Secondary | ICD-10-CM

## 2022-09-09 DIAGNOSIS — D631 Anemia in chronic kidney disease: Secondary | ICD-10-CM

## 2022-09-09 DIAGNOSIS — I129 Hypertensive chronic kidney disease with stage 1 through stage 4 chronic kidney disease, or unspecified chronic kidney disease: Secondary | ICD-10-CM

## 2022-09-09 DIAGNOSIS — R809 Proteinuria, unspecified: Secondary | ICD-10-CM

## 2022-09-09 DIAGNOSIS — N2581 Secondary hyperparathyroidism of renal origin: Secondary | ICD-10-CM

## 2022-09-09 DIAGNOSIS — N1832 Chronic kidney disease, stage 3b: Secondary | ICD-10-CM

## 2022-09-09 MED ORDER — SODIUM CHLORIDE 0.9 % IV SOLN
10.0000 mg/kg | Freq: Once | INTRAVENOUS | Status: AC
  Administered 2022-09-09: 1000 mg via INTRAVENOUS
  Filled 2022-09-09: qty 100

## 2022-09-09 MED ORDER — DIPHENHYDRAMINE HCL 25 MG PO CAPS
25.0000 mg | ORAL_CAPSULE | Freq: Once | ORAL | Status: AC
  Administered 2022-09-09: 25 mg via ORAL
  Filled 2022-09-09: qty 1

## 2022-09-09 MED ORDER — ACETAMINOPHEN 325 MG PO TABS
650.0000 mg | ORAL_TABLET | Freq: Once | ORAL | Status: AC
  Administered 2022-09-09: 650 mg via ORAL
  Filled 2022-09-09: qty 2

## 2022-09-09 NOTE — Progress Notes (Signed)
Diagnosis: Crohn's Disease  Provider:  Chilton Greathouse MD  Procedure: IV Infusion  IV Type: Peripheral, IV Location: L Antecubital  Remicade (Infliximab), Dose: 1000 mg  Infusion Start Time: 1122 am  Infusion Stop Time: 1337  pm  Post Infusion IV Care: Peripheral IV Discontinued  Discharge: Condition: Good, Destination: Home . AVS Provided  Performed by:  Loney Hering, LPN

## 2022-09-29 ENCOUNTER — Telehealth: Payer: Self-pay | Admitting: Family

## 2022-09-29 DIAGNOSIS — Z1231 Encounter for screening mammogram for malignant neoplasm of breast: Secondary | ICD-10-CM

## 2022-09-29 NOTE — Telephone Encounter (Signed)
See mychart.  

## 2022-09-30 ENCOUNTER — Other Ambulatory Visit: Payer: Self-pay | Admitting: Internal Medicine

## 2022-10-01 ENCOUNTER — Telehealth: Payer: Self-pay | Admitting: Family

## 2022-10-01 NOTE — Telephone Encounter (Signed)
It looks like she never completed her diagnostic breast imaging we ordered last year. Please call pt and give her number for the [facility] to call and request an appointment.

## 2022-10-04 NOTE — Telephone Encounter (Signed)
Called but no answer and no answering machine. Will try again later

## 2022-10-05 NOTE — Telephone Encounter (Signed)
Called again but no answer, was able to leave a vm this time with detailed information.

## 2022-10-08 NOTE — Telephone Encounter (Signed)
Called again but still no answer, information given to granddaughter Gensis (on emergency contacts) she will given patient the message to call for appointment

## 2022-10-10 ENCOUNTER — Telehealth: Payer: Self-pay | Admitting: Family

## 2022-10-10 DIAGNOSIS — R928 Other abnormal and inconclusive findings on diagnostic imaging of breast: Secondary | ICD-10-CM

## 2022-10-10 NOTE — Telephone Encounter (Signed)
It looks like she never scheduled her diagnostic mammogram to evaluate abnormal mammogram back in 2022.  It looks like she is scheduled for her screening mammo on 6/20, however they will want to do it at the [facility] instead of [facility].  I am changing orders.  Please give her number to the [facility] to reschedule.

## 2022-10-11 NOTE — Telephone Encounter (Signed)
Patient notified and she will call to schedule at the breast center.  I have cancelled that appointment here in imaging for her.

## 2022-10-12 ENCOUNTER — Encounter: Payer: Self-pay | Admitting: Internal Medicine

## 2022-10-12 ENCOUNTER — Ambulatory Visit: Payer: PPO | Admitting: Internal Medicine

## 2022-10-12 VITALS — BP 122/70 | HR 68 | Ht <= 58 in | Wt 210.0 lb

## 2022-10-12 DIAGNOSIS — K624 Stenosis of anus and rectum: Secondary | ICD-10-CM

## 2022-10-12 DIAGNOSIS — K5900 Constipation, unspecified: Secondary | ICD-10-CM

## 2022-10-12 DIAGNOSIS — K50119 Crohn's disease of large intestine with unspecified complications: Secondary | ICD-10-CM

## 2022-10-12 DIAGNOSIS — K2100 Gastro-esophageal reflux disease with esophagitis, without bleeding: Secondary | ICD-10-CM

## 2022-10-12 NOTE — Patient Instructions (Signed)
Continue Infliximab every 6 weeks, Imuran 100 mg daily, Amitiza 8 mcg twice daily, Miralax daily and omeprazole daily.   Please follow-up with Dr. Rhea Belton in 6 months.   _______________________________________________________  If your blood pressure at your visit was 140/90 or greater, please contact your primary care physician to follow up on this.  _______________________________________________________  If you are age 69 or older, your body mass index should be between 23-30. Your Body mass index is 43.89 kg/m. If this is out of the aforementioned range listed, please consider follow up with your Primary Care Provider.  If you are age 69 or younger, your body mass index should be between 19-25. Your Body mass index is 43.89 kg/m. If this is out of the aformentioned range listed, please consider follow up with your Primary Care Provider.   ________________________________________________________  The [place] [facility] providers would like to encourage you to use [facility] to communicate with providers for non-urgent requests or questions.  Due to long hold times on the telephone, sending your provider a message by [facility] may be a faster and more efficient way to get a response.  Please allow 48 business hours for a response.  Please remember that this is for non-urgent requests.  _______________________________________________________

## 2022-10-12 NOTE — Progress Notes (Signed)
Subjective:    Patient ID: Kathy [name], female    DOB: 04-30-1955, 66 y.o.   MRN: [id number]  HPI Kathy [name] is a 67 year old female with a history of Crohn's disease of the colon and anorectum with prior fistula on infliximab (started April 2022), anal stenosis secondary to Crohn's, GERD with a history of esophagitis, hypertension, diabetes, hyperlipidemia and asthma who is here for follow-up.  She is here alone today and was last seen in the office on 07/02/2022.  She reports on the whole she feels like she is better than she has been in some time.  She states that she feels like "we are getting there".  She is using MiraLAX at least 4 days a week and Amitiza 8 mcg twice daily.  May be 1 day a week and what is her biggest complaint is her "surprises".  To her this is when she has spontaneous loose stool not always being aware of the same.  She is wearing a pull-up which prevents her from soiling her close.  Her abdominal pain episodes are down to 2 times a month or less for which Bentyl is helpful.  She will occasionally have a small blood clot pass prior to bowel movements.  Her reflux is well-controlled.  Her last infliximab dose was 09/09/2022 and she is tolerating these without incident.  No issues at the infusion center.   Review of Systems As per HPI, otherwise negative  Current Medications, Allergies, Past Medical History, Past Surgical History, Family History and Social History were reviewed in [employer] record.    Objective:   Physical Exam BP 122/70   Pulse 68   Ht 4\' 10"  (1.473 m)   Wt 210 lb (95.3 kg)   BMI 43.89 kg/m  Gen: awake, alert, NAD HEENT: anicteric  Abd: soft, NT/ND, +BS throughout Neuro: nonfocal     Latest Ref Rng & Units 07/02/2022   10:08 AM 08/11/2021   12:12 PM 10/23/2020   11:31 AM  CBC  WBC 4.0 - 10.5 K/uL 5.2  5.5  5.5   Hemoglobin 12.0 - 15.0 g/dL 86.5  78.4  69.6   Hematocrit 36.0 - 46.0 % 35.6  36.0  35.9    Platelets 150.0 - 400.0 K/uL 378.0  333.0  337.0    Iron/TIBC/Ferritin/ %Sat    Component Value Date/Time   IRON 62 07/02/2022 1008   IRON 35 12/14/2017 1009   TIBC 352.8 07/02/2022 1008   TIBC 274 12/14/2017 1009   FERRITIN 21.5 07/02/2022 1008   FERRITIN 103 12/14/2017 1009   IRONPCTSAT 17.6 (L) 07/02/2022 1008   IRONPCTSAT 13 (L) 12/14/2017 1009   CMP     Component Value Date/Time   NA 138 07/06/2022 1059   NA 139 10/11/2018 1009   K 4.7 07/06/2022 1059   CL 102 07/06/2022 1059   CO2 27 07/06/2022 1059   GLUCOSE 167 (H) 07/06/2022 1059   BUN 16 07/06/2022 1059   BUN 14 10/11/2018 1009   CREATININE 1.30 (H) 07/06/2022 1059   CREATININE 1.49 (H) 02/04/2020 1114   CALCIUM 9.7 07/06/2022 1059   PROT 7.8 07/06/2022 1059   PROT 7.4 10/11/2018 1009   ALBUMIN 3.7 07/06/2022 1059   ALBUMIN 3.5 (L) 10/11/2018 1009   AST 12 07/06/2022 1059   ALT 10 07/06/2022 1059   ALKPHOS 112 07/06/2022 1059   BILITOT 0.3 07/06/2022 1059   BILITOT 0.3 10/11/2018 1009   GFR 42.12 (L) 07/06/2022 1059   GFRNONAA 42 (L) 10/11/2018  1009         Assessment & Plan:  67 year old female with a history of Crohn's disease of the colon and anorectum with prior fistula on infliximab (started April 2022), anal stenosis secondary to Crohn's, GERD with a history of esophagitis, hypertension, diabetes, hyperlipidemia and asthma who is here for follow-up.   Crohn's colitis and perianal Crohn's/anal stenosis/history of Crohn's related fistulas --on the whole I really do feel that she is improving.  She is tolerating infliximab well and has not developed antibodies.  Her drug levels have remained good (the last drug level was just after an infusion).  Some of her "surprises" are related to decreased rectal sensation rectal scarring from severe Crohn's.  We discussed this today -- Continue infliximab 10 mg/kg every 6 weeks -- Continue azathioprine 100 mg daily -- Bentyl 10 mg, 1 to 2 capsules 3 times a day  as needed -- Surveillance colonoscopy recommended in 2025; discuss at follow-up  2.  Constipation exacerbated by anal stenosis -- Continue Amitiza 8 mcg twice daily -- Titration of MiraLAX but I expect she may need this even daily  3.  History of anemia with microcytosis --follow blood counts and iron studies.  She has not tolerated oral iron and if ever in the future she needs iron it should be IV  4.  GERD with esophagitis --well-controlled on omeprazole continue 40 mg daily.  52-month follow-up with me, sooner if needed  30 minutes total spent today including patient facing time, coordination of care, reviewing medical history/procedures/pertinent radiology studies, and documentation of the encounter.

## 2022-10-14 ENCOUNTER — Inpatient Hospital Stay (HOSPITAL_BASED_OUTPATIENT_CLINIC_OR_DEPARTMENT_OTHER): Admission: RE | Admit: 2022-10-14 | Payer: PPO | Source: Ambulatory Visit

## 2022-10-15 ENCOUNTER — Telehealth: Payer: Self-pay | Admitting: Family

## 2022-10-15 NOTE — Telephone Encounter (Signed)
Patient called and wanted to let provider know that she is experiencing very bad leg cramps, she would also like Windell Moulding to give her a call at her earliest convenience.

## 2022-10-15 NOTE — Telephone Encounter (Signed)
Patient reports she has been having leg cramps on and off for 6 months. Was given appointment for Friday 10-22-22 for evaluation

## 2022-10-18 ENCOUNTER — Other Ambulatory Visit: Payer: Self-pay | Admitting: Family

## 2022-10-18 DIAGNOSIS — E119 Type 2 diabetes mellitus without complications: Secondary | ICD-10-CM

## 2022-10-19 ENCOUNTER — Ambulatory Visit: Payer: PPO

## 2022-10-19 ENCOUNTER — Ambulatory Visit
Admission: RE | Admit: 2022-10-19 | Discharge: 2022-10-19 | Disposition: A | Discharge status: Home / Self Care | Payer: PPO | Source: Ambulatory Visit | Attending: Family | Admitting: Family

## 2022-10-19 DIAGNOSIS — R928 Other abnormal and inconclusive findings on diagnostic imaging of breast: Secondary | ICD-10-CM

## 2022-10-21 ENCOUNTER — Ambulatory Visit (INDEPENDENT_AMBULATORY_CARE_PROVIDER_SITE_OTHER): Payer: PPO

## 2022-10-21 VITALS — BP 141/75 | HR 63 | Temp 98.0°F | Resp 20 | Ht <= 58 in | Wt 209.8 lb

## 2022-10-21 DIAGNOSIS — K50111 Crohn's disease of large intestine with rectal bleeding: Secondary | ICD-10-CM | POA: Diagnosis not present

## 2022-10-21 MED ORDER — SODIUM CHLORIDE 0.9 % IV SOLN
10.0000 mg/kg | Freq: Once | INTRAVENOUS | Status: AC
  Administered 2022-10-21: 1000 mg via INTRAVENOUS
  Filled 2022-10-21: qty 100

## 2022-10-21 MED ORDER — ACETAMINOPHEN 325 MG PO TABS
650.0000 mg | ORAL_TABLET | Freq: Once | ORAL | Status: AC
  Administered 2022-10-21: 650 mg via ORAL
  Filled 2022-10-21: qty 2

## 2022-10-21 MED ORDER — DIPHENHYDRAMINE HCL 25 MG PO CAPS
25.0000 mg | ORAL_CAPSULE | Freq: Once | ORAL | Status: AC
  Administered 2022-10-21: 25 mg via ORAL
  Filled 2022-10-21: qty 1

## 2022-10-21 NOTE — Progress Notes (Signed)
Diagnosis: Crohn's Disease  Provider:  Chilton Greathouse MD  Procedure: IV Infusion  IV Type: Peripheral, IV Location: R Antecubital  Remicade (Infliximab), Dose: 1000 mg  Infusion Start Time: 1148  Infusion Stop Time: 1402  Post Infusion IV Care: Peripheral IV Discontinued  Discharge: Condition: Good, Destination: Home . AVS Declined  Performed by:  Garnette Czech, RN

## 2022-10-22 ENCOUNTER — Ambulatory Visit (INDEPENDENT_AMBULATORY_CARE_PROVIDER_SITE_OTHER): Payer: PPO | Admitting: Family

## 2022-10-22 VITALS — BP 135/59 | HR 72 | Temp 98.6°F | Wt 212.0 lb

## 2022-10-22 DIAGNOSIS — E039 Hypothyroidism, unspecified: Secondary | ICD-10-CM

## 2022-10-22 DIAGNOSIS — E119 Type 2 diabetes mellitus without complications: Secondary | ICD-10-CM

## 2022-10-22 DIAGNOSIS — R779 Abnormality of plasma protein, unspecified: Secondary | ICD-10-CM

## 2022-10-22 DIAGNOSIS — R5383 Other fatigue: Secondary | ICD-10-CM

## 2022-10-22 DIAGNOSIS — M5416 Radiculopathy, lumbar region: Secondary | ICD-10-CM

## 2022-10-22 DIAGNOSIS — R600 Localized edema: Secondary | ICD-10-CM | POA: Diagnosis not present

## 2022-10-22 LAB — CBC WITH DIFFERENTIAL/PLATELET
Basophils Absolute: 42 cells/uL (ref 0–200)
Eosinophils Relative: 3.2 %
Lymphs Abs: 763 cells/uL — ABNORMAL LOW (ref 850–3900)
MCH: 21.6 pg — ABNORMAL LOW (ref 27.0–33.0)
MCHC: 30.9 g/dL — ABNORMAL LOW (ref 32.0–36.0)
Monocytes Relative: 12.5 %
Neutrophils Relative %: 69.1 %

## 2022-10-22 NOTE — Progress Notes (Signed)
Subjective:     Patient ID: Kathy [name], female    DOB: August 06, 1955, 67 y.o.   MRN: [id number]  Chief Complaint  Patient presents with  . Leg Pain    Patient complains of left leg cramps on and off for about a year    HPI  Discussed the use of AI scribe software for clinical note transcription with the patient, who gave verbal consent to proceed.  History of Present Illness              Health Maintenance Due  Topic Date Due  . Pneumonia Vaccine 66+ Years old (1 of 1 - PCV) Never done  . Colonoscopy  05/22/2021  . COVID-19 Vaccine (5 - 2023-24 season) 04/26/2022    Past Medical History:  Diagnosis Date  . Anal stenosis   . Asthma    09-23-2017  per pt has not done inhaler since May 2018, no insurance (QVAR bid and Ventolin prn)  . B12 deficiency 01/05/2021  . Crohn disease (HCC)   . Diastolic dysfunction    per echo 06-23-2009  grade 2 diastolic dysfunction , ef 60-65%  . Esophagitis   . GERD (gastroesophageal reflux disease)   . Hemorrhoids   . Hiatal hernia   . Hyperlipidemia    stopped meds due to side effects  . Hypertension    09-23-2017  per pt has takes bp meds since May 2018 no insurance (losartan 100mg  qd, HCTZ 12.5mg  qd, and toprol 50 qd)  . Microalbuminuria 06/01/2006   Qualifier: Diagnosis of  By: Allena Katz MD, Sejal    . Type 2 diabetes mellitus (HCC)    09-23-2017 per pt has not taken diabetic meds since May 2018, no insurance (kombiglyze 5-1000mg  qd)    Past Surgical History:  Procedure Laterality Date  . BREAST BIOPSY Bilateral   . CARDIOVASCULAR STRESS TEST  07/29/2009   normal nuclear study w/ no ischemia/  normal LV function and wall motion,  ef 70%  . CESAREAN SECTION  1980s  . RECTAL BIOPSY N/A 10/03/2017   Procedure: POSSIBLE BIOPSY RECTAL;  Surgeon: Andria Meuse, MD;  Location: [facility];  Service: General;  Laterality: N/A;    Family History  Problem Relation Age of Onset  . Hypertension Mother 47  . Stroke Mother 37   . Diabetes Maternal Aunt        s/p bilater amputation due to DM  . Asthma Father   . Colon cancer Neg Hx   . Esophageal cancer Neg Hx   . Stomach cancer Neg Hx   . Rectal cancer Neg Hx     Social History   Socioeconomic History  . Marital status: Married    Spouse name: Not on file  . Number of children: 2  . Years of education: Not on file  . Highest education level: Not on file  Occupational History  . Occupation: unemployed  Tobacco Use  . Smoking status: Never  . Smokeless tobacco: Never  Vaping Use  . Vaping Use: Never used  Substance and Sexual Activity  . Alcohol use: No  . Drug use: No  . Sexual activity: Not Currently  Other Topics Concern  . Not on file  Social History Narrative   Not working currently. Retired in 2019. Previously worked in [employer] job   Married    2 children (son and daughter). They are both local   She has 1 grand-daughter and 1 grand-son   No pets (allergic)   Completed 4 years  of college.   Social Determinants of Health   Financial Resource Strain: Low Risk  (08/13/2022)   Overall Financial Resource Strain (CARDIA)   . Difficulty of Paying Living Expenses: Not hard at all  Food Insecurity: No Food Insecurity (08/13/2022)   Hunger Vital Sign   . Worried About Programme researcher, broadcasting/film/video in the Last Year: Never true   . Ran Out of Food in the Last Year: Never true  Transportation Needs: No Transportation Needs (08/13/2022)   PRAPARE - Transportation   . Lack of Transportation (Medical): No   . Lack of Transportation (Non-Medical): No  Physical Activity: Inactive (08/13/2022)   Exercise Vital Sign   . Days of Exercise per Week: 0 days   . Minutes of Exercise per Session: 0 min  Stress: No Stress Concern Present (08/13/2022)   [employer] of Occupational Health - Occupational Stress Questionnaire   . Feeling of Stress : Only a little  Social Connections: Moderately Integrated (08/13/2022)   Social Connection and Isolation Panel  [NHANES]   . Frequency of Communication with Friends and Family: More than three times a week   . Frequency of Social Gatherings with Friends and Family: Twice a week   . Attends Religious Services: More than 4 times per year   . Active Member of Clubs or Organizations: No   . Attends Banker Meetings: Never   . Marital Status: Married  Catering manager Violence: Not At Risk (08/13/2022)   Humiliation, Afraid, Rape, and Kick questionnaire   . Fear of Current or Ex-Partner: No   . Emotionally Abused: No   . Physically Abused: No   . Sexually Abused: No    Outpatient Medications Prior to Visit  Medication Sig Dispense Refill  . albuterol (VENTOLIN HFA) 108 (90 Base) MCG/ACT inhaler Inhale 1 puff into the lungs every 6 (six) hours as needed for wheezing or shortness of breath. 18 g 5  . amLODipine (NORVASC) 10 MG tablet Take 1 tablet by mouth once daily 90 tablet 0  . azaTHIOprine (IMURAN) 50 MG tablet Take 2 tablets by mouth once daily 60 tablet 0  . clotrimazole-betamethasone (LOTRISONE) cream Apply 1 application topically 2 (two) times daily. 30 g 1  . colchicine 0.6 MG tablet Take 2 tabs by mouth now and 1 tab in 1 hour. 6 tablet 1  . Continuous Blood Gluc Receiver (FREESTYLE LIBRE 14 DAY READER) DEVI Use as directed 1 each 0  . Continuous Blood Gluc Sensor (FREESTYLE LIBRE 14 DAY SENSOR) MISC Change every 14 days. 6 each 5  . COVID-19 mRNA vaccine 2023-2024 (COMIRNATY) syringe Inject into the muscle. 0.3 mL 0  . dicyclomine (BENTYL) 10 MG capsule TAKE 1 CAPSULE BY MOUTH THREE TIMES DAILY AS NEEDED FOR  CRAMPING 90 capsule 2  . glipiZIDE (GLUCOTROL XL) 10 MG 24 hr tablet Take 1 tablet (10 mg total) by mouth daily with breakfast. 90 tablet 0  . hydrochlorothiazide (MICROZIDE) 12.5 MG capsule Take 1 capsule (12.5 mg total) by mouth daily. 90 capsule 1  . hydrocortisone (ANUSOL-HC) 25 MG suppository Place 1 suppository (25 mg total) rectally at bedtime. 14 suppository 0  .  lubiprostone (AMITIZA) 8 MCG capsule Take 1 capsule (8 mcg total) by mouth 2 (two) times daily with a meal. Please keep your June appointment for further refills. Thank you 60 capsule 1  . metoprolol succinate (TOPROL-XL) 50 MG 24 hr tablet Take 1 tablet (50 mg total) by mouth daily. Take with or immediately following a  meal 90 tablet 0  . Multiple Vitamins-Minerals (MULTIVITAMIN WITH MINERALS) tablet Take 1 tablet by mouth daily.    Marland Kitchen omeprazole (PRILOSEC) 40 MG capsule Take 1 capsule by mouth once daily 90 capsule 0  . pioglitazone (ACTOS) 30 MG tablet Take 1 tablet (30 mg total) by mouth daily. 90 tablet 1  . polyethylene glycol (MIRALAX / GLYCOLAX) 17 g packet Take 17 g by mouth as needed. 1 capful prn 30 each 11  . REMICADE 100 MG injection Inject 1,000 mg into the vein every 6 (six) weeks.    . simvastatin (ZOCOR) 10 MG tablet Take 1 tablet by mouth once daily 90 tablet 0  . sitaGLIPtin (JANUVIA) 50 MG tablet Take 1 tablet (50 mg total) by mouth daily. 90 tablet 0  . valsartan (DIOVAN) 40 MG tablet Take 1 tablet by mouth once daily 90 tablet 0  . vitamin B-12 (CYANOCOBALAMIN) 1000 MCG tablet Take 1 tablet (1,000 mcg total) by mouth daily. 1 tablet 0   No facility-administered medications prior to visit.    Allergies  Allergen Reactions  . Metformin Nausea Only  . Lipitor [Atorvastatin Calcium]     Leg cramps   . Lisinopril Itching  . Pravachol [Pravastatin Sodium]   . Pravastatin Sodium Other (See Comments)    leg cramps    ROS     Objective:    Physical Exam   BP (!) 135/59 (BP Location: Right Arm, Patient Position: Sitting, Cuff Size: Large)   Pulse 72   Temp 98.6 F (37 C) (Oral)   Wt 212 lb (96.2 kg)   SpO2 100%   BMI 44.31 kg/m  Wt Readings from Last 3 Encounters:  10/22/22 212 lb (96.2 kg)  10/21/22 209 lb 12.8 oz (95.2 kg)  10/12/22 210 lb (95.3 kg)       Assessment & Plan:   Problem List Items Addressed This Visit   None   I am having Jenyfer  Smith-Milliken maintain her polyethylene glycol, clotrimazole-betamethasone, hydrocortisone, multivitamin with minerals, colchicine, cyanocobalamin, pioglitazone, Comirnaty, albuterol, FreeStyle Libre 14 Day Sensor, Remicade, dicyclomine, FreeStyle Libre 14 Day Reader, hydrochlorothiazide, sitaGLIPtin, metoprolol succinate, omeprazole, lubiprostone, simvastatin, amLODipine, valsartan, azaTHIOprine, and glipiZIDE.  No orders of the defined types were placed in this encounter.

## 2022-10-23 ENCOUNTER — Telehealth: Payer: Self-pay | Admitting: Family

## 2022-10-23 DIAGNOSIS — R779 Abnormality of plasma protein, unspecified: Secondary | ICD-10-CM

## 2022-10-23 DIAGNOSIS — D649 Anemia, unspecified: Secondary | ICD-10-CM

## 2022-10-23 DIAGNOSIS — R5383 Other fatigue: Secondary | ICD-10-CM | POA: Insufficient documentation

## 2022-10-23 DIAGNOSIS — R600 Localized edema: Secondary | ICD-10-CM | POA: Insufficient documentation

## 2022-10-23 LAB — COMPREHENSIVE METABOLIC PANEL
AG Ratio: 0.9 (calc) — ABNORMAL LOW (ref 1.0–2.5)
ALT: 9 U/L (ref 6–29)
AST: 11 U/L (ref 10–35)
Albumin: 3.9 g/dL (ref 3.6–5.1)
Alkaline phosphatase (APISO): 111 U/L (ref 37–153)
BUN/Creatinine Ratio: 14 (calc) (ref 6–22)
BUN: 19 mg/dL (ref 7–25)
CO2: 26 mmol/L (ref 20–32)
Calcium: 9.6 mg/dL (ref 8.6–10.4)
Chloride: 102 mmol/L (ref 98–110)
Creat: 1.33 mg/dL — ABNORMAL HIGH (ref 0.50–1.05)
Globulin: 4.3 g/dL (calc) — ABNORMAL HIGH (ref 1.9–3.7)
Glucose, Bld: 155 mg/dL — ABNORMAL HIGH (ref 65–99)
Potassium: 4 mmol/L (ref 3.5–5.3)
Sodium: 139 mmol/L (ref 135–146)
Total Bilirubin: 0.2 mg/dL (ref 0.2–1.2)
Total Protein: 8.2 g/dL — ABNORMAL HIGH (ref 6.1–8.1)

## 2022-10-23 LAB — CBC WITH DIFFERENTIAL/PLATELET
Absolute Monocytes: 663 cells/uL (ref 200–950)
Basophils Relative: 0.8 %
Eosinophils Absolute: 170 cells/uL (ref 15–500)
HCT: 34 % — ABNORMAL LOW (ref 35.0–45.0)
Hemoglobin: 10.5 g/dL — ABNORMAL LOW (ref 11.7–15.5)
MCV: 70.1 fL — ABNORMAL LOW (ref 80.0–100.0)
MPV: 9.8 fL (ref 7.5–12.5)
Neutro Abs: 3662 cells/uL (ref 1500–7800)
Platelets: 354 10*3/uL (ref 140–400)
RBC: 4.85 10*6/uL (ref 3.80–5.10)
RDW: 17.2 % — ABNORMAL HIGH (ref 11.0–15.0)
Total Lymphocyte: 14.4 %
WBC: 5.3 10*3/uL (ref 3.8–10.8)

## 2022-10-23 LAB — HEMOGLOBIN A1C
Hgb A1c MFr Bld: 6.9 % of total Hgb — ABNORMAL HIGH (ref <5.7)
Mean Plasma Glucose: 151 mg/dL
eAG (mmol/L): 8.4 mmol/L

## 2022-10-23 LAB — TSH: TSH: 2.25 mIU/L (ref 0.40–4.50)

## 2022-10-23 NOTE — Patient Instructions (Signed)
VISIT SUMMARY:  During your visit, we discussed your severe left leg pain, which seems to be originating from your lower back and radiating down to your ankle. This pain is likely due to nerve irritation from degenerative disc disease, a condition where the discs in your spine wear down over time. We also discussed your significant fatigue and decreased mobility, as well as the swelling in your lower extremities. Lastly, we confirmed that your next colonoscopy is due in 2025.  YOUR PLAN:  -LOWER BACK PAIN WITH RADICULOPATHY: We will refer you to physical therapy for exercises to strengthen your lower back and core. We will also check your labs to rule out other causes of your pain. If your pain becomes severe, we may consider a short course of steroids to help manage it.  -GENERALIZED FATIGUE: We will check your labs to rule out any metabolic causes of your fatigue. We have scheduled a three-month follow-up appointment, but please reach out sooner if your symptoms worsen.  -LOWER EXTREMITY EDEMA: We will monitor the swelling in your lower extremities, which may be related to your hypertension medication, amlodipine. If the swelling becomes intolerable, we may consider adjusting your medication.  -COLONOSCOPY: Your next colonoscopy is due in 2025. We will continue with the current plan for this procedure.  INSTRUCTIONS:  Please follow the plan as discussed. Attend physical therapy as referred, and take any prescribed medications as directed. Monitor your symptoms and report any changes or worsening of symptoms. Remember to attend your follow-up appointment in three months, or sooner if your symptoms worsen.

## 2022-10-23 NOTE — Assessment & Plan Note (Signed)
Lower Back Pain with Radiculopathy: Severe, intermittent left leg pain originating from the lower back and radiating down to the ankle. The pain has been ongoing for over a year and also occurs in the right leg. The patient has a history of lower back pain. The pain is likely due to nerve irritation from degenerative disc disease. -Refer to physical therapy for low back and core exercises. -Check labs to rule out other causes of pain. -Consider short course of steroids for severe episodes.

## 2022-10-23 NOTE — Assessment & Plan Note (Signed)
Patient reports significant fatigue and decreased mobility, with difficulty walking short distances. The fatigue is severe enougto interfere with daily activities and plans. -Check labs to rule out metabolic causes of fatigue. -Schedule a three-month follow-up appointment, with instructions for the patient to reach out sooner if symptoms worsen.

## 2022-10-23 NOTE — Assessment & Plan Note (Signed)
Patient reports ongoing lower extremity swelling, possibly related to amlodipine use. The patient is currently on amlodipine 10mg  for hypertension. -Monitor swelling and consider adjusting antihypertensive therapy if swelling becomes intolerable for the patient.

## 2022-10-23 NOTE — Telephone Encounter (Signed)
Protein level in her blood is a bit elevated.  I would like her to return for some additional lab work.   She is anemic- will include iron and b12 levels in her next blood draw.  Sugar remains a goal.

## 2022-10-23 NOTE — Assessment & Plan Note (Signed)
Incidental finding- will have pt return to lab for SPEP.

## 2022-10-25 NOTE — Telephone Encounter (Signed)
Tried calling Pt, no answer, no voicemail set up.

## 2022-10-27 NOTE — Telephone Encounter (Signed)
Patient notified and is scheduled for lab work on 11/02/22

## 2022-11-01 ENCOUNTER — Other Ambulatory Visit: Payer: Self-pay | Admitting: Family

## 2022-11-01 DIAGNOSIS — I10 Essential (primary) hypertension: Secondary | ICD-10-CM

## 2022-11-02 ENCOUNTER — Other Ambulatory Visit (INDEPENDENT_AMBULATORY_CARE_PROVIDER_SITE_OTHER): Payer: PPO

## 2022-11-02 DIAGNOSIS — R779 Abnormality of plasma protein, unspecified: Secondary | ICD-10-CM | POA: Diagnosis not present

## 2022-11-02 DIAGNOSIS — D649 Anemia, unspecified: Secondary | ICD-10-CM | POA: Diagnosis not present

## 2022-11-02 LAB — VITAMIN B12: Vitamin B-12: 337 pg/mL (ref 211–911)

## 2022-11-03 LAB — IRON,TIBC AND FERRITIN PANEL: Ferritin: 27 ng/mL (ref 16–288)

## 2022-11-04 LAB — PROTEIN ELECTROPHORESIS, SERUM
Albumin ELP: 3.3 g/dL — ABNORMAL LOW (ref 3.8–4.8)
Alpha 1: 0.4 g/dL — ABNORMAL HIGH (ref 0.2–0.3)
Alpha 2: 0.9 g/dL (ref 0.5–0.9)
Beta 2: 0.6 g/dL — ABNORMAL HIGH (ref 0.2–0.5)
Beta Globulin: 0.5 g/dL (ref 0.4–0.6)
Gamma Globulin: 2.1 g/dL — ABNORMAL HIGH (ref 0.8–1.7)
Total Protein: 7.8 g/dL (ref 6.1–8.1)

## 2022-11-04 LAB — IRON,TIBC AND FERRITIN PANEL
%SAT: 12 % (calc) — ABNORMAL LOW (ref 16–45)
Iron: 37 ug/dL — ABNORMAL LOW (ref 45–160)
TIBC: 309 mcg/dL (calc) (ref 250–450)

## 2022-11-06 ENCOUNTER — Other Ambulatory Visit: Payer: Self-pay | Admitting: Family

## 2022-11-06 DIAGNOSIS — R778 Other specified abnormalities of plasma proteins: Secondary | ICD-10-CM | POA: Insufficient documentation

## 2022-11-06 DIAGNOSIS — D509 Iron deficiency anemia, unspecified: Secondary | ICD-10-CM

## 2022-11-06 NOTE — Telephone Encounter (Addendum)
Some of the proteins in her blood are elevated.  This could be a result of her history of Crohn's disease but I would like for her to see the blood specialist upstairs for consultation.    Also, her iron level is low.  Please add otc iron 325mg  one tab by mouth every other day.

## 2022-11-08 ENCOUNTER — Other Ambulatory Visit: Payer: Self-pay | Admitting: Internal Medicine

## 2022-11-08 MED ORDER — IRON (FERROUS SULFATE) 325 (65 FE) MG PO TABS
325.0000 mg | ORAL_TABLET | ORAL | Status: AC
Start: 2022-11-08 — End: ?

## 2022-11-08 NOTE — Telephone Encounter (Signed)
Patient notified of results and referral and she agrees with plan. She will get the otc iron as well.

## 2022-11-12 NOTE — Therapy (Signed)
OUTPATIENT PHYSICAL THERAPY THORACOLUMBAR EVALUATION   Patient Name: Kathy [name] MRN: [id number] DOB:1955-09-15, 67 y.o., female Today's Date: 11/17/2022  END OF SESSION:  PT End of Session - 11/17/22 1105     Visit Number 1    Date for PT Re-Evaluation 01/12/23    Authorization Type [employer]    Progress Note Due on Visit 10    PT Start Time 1105    PT Stop Time 1153    PT Time Calculation (min) 48 min    Activity Tolerance Patient tolerated treatment well    Behavior During Therapy [facility] for tasks assessed/performed             Past Medical History:  Diagnosis Date   Anal stenosis    Asthma    09-23-2017  per pt has not done inhaler since May 2018, no insurance (QVAR bid and Ventolin prn)   B12 deficiency 01/05/2021   Crohn disease (HCC)    Diastolic dysfunction    per echo 06-23-2009  grade 2 diastolic dysfunction , ef 60-65%   Esophagitis    GERD (gastroesophageal reflux disease)    Hemorrhoids    Hiatal hernia    Hyperlipidemia    stopped meds due to side effects   Hypertension    09-23-2017  per pt has takes bp meds since May 2018 no insurance (losartan 100mg  qd, HCTZ 12.5mg  qd, and toprol 50 qd)   Microalbuminuria 06/01/2006   Qualifier: Diagnosis of  By: Allena Katz MD, Sejal     Type 2 diabetes mellitus (HCC)    09-23-2017 per pt has not taken diabetic meds since May 2018, no insurance (kombiglyze 5-1000mg  qd)   Past Surgical History:  Procedure Laterality Date   BREAST BIOPSY Bilateral    CARDIOVASCULAR STRESS TEST  07/29/2009   normal nuclear study w/ no ischemia/  normal LV function and wall motion,  ef 70%   CESAREAN SECTION  1980s   RECTAL BIOPSY N/A 10/03/2017   Procedure: POSSIBLE BIOPSY RECTAL;  Surgeon: Andria Meuse, MD;  Location: [facility];  Service: General;  Laterality: N/A;   Patient Active Problem List   Diagnosis Date Noted   Abnormal SPEP 11/06/2022   Fatigue 10/23/2022   Lower extremity edema 10/23/2022   Elevated  serum protein level 10/23/2022   Uncontrolled type 2 diabetes mellitus with hyperglycemia (HCC) 07/06/2022   Irritable bowel syndrome with constipation 03/10/2022   Lumbar radiculopathy 10/28/2021   Chronic kidney disease, stage 3b (HCC) 07/09/2021   Controlled type 2 diabetes mellitus without complication, without long-term current use of insulin (HCC) 07/09/2021   Hip pain 07/09/2021   B12 deficiency 01/05/2021   Tinea pedis of both feet 10/03/2020   Healthcare maintenance 05/19/2018   Crohn disease (HCC) 03/09/2018   Diastolic dysfunction 03/09/2018   GERD (gastroesophageal reflux disease) 03/09/2018   Migraine without aura 07/17/2008   Microalbuminuria 06/01/2006   Hyperlipidemia 05/30/2006   Essential hypertension 05/30/2006   Asthma 05/30/2006    PCP: Sandford Craze, NP   REFERRING PROVIDER: Sandford Craze, NP   REFERRING DIAG: M54.16 (ICD-10-CM) - Lumbar radiculopathy   THERAPY DIAG:  Radiculopathy, lumbar region  Other low back pain  Muscle weakness (generalized)  Other muscle spasm  Difficulty in walking, not elsewhere classified  RATIONALE FOR EVALUATION AND TREATMENT: Rehabilitation  ONSET DATE: chronic "on and off"  NEXT MD VISIT: 01/24/23   SUBJECTIVE:  SUBJECTIVE STATEMENT: Pt reports the complaint that led to her PT referral was leg cramps in her B inner thighs - pain so intense that it was off the scale and almost caused her to faint. Low back pain has been an issue "on and off" for 2-3 years. Radicular pain varies in pattern and intensity, but does not note any specific numbness or tingling. She states her PCP wants her to be more active but she does not feel that she has the stamina to walk due to weakness - unable to walk for more than 10-15  minutes.  PAIN: Are you having pain? Yes: NPRS scale: 2-3/10 currently, at worst 8/10 Pain location: B low back  Pain description: constant ache Aggravating factors: movement - bending, lifting Relieving factors: heat  PERTINENT HISTORY:  HTN, DM-II, Asthma, Crohn's disease, GERD, IBS, CKD, fatigue, hip pain  PRECAUTIONS: None  RED FLAGS: None  WEIGHT BEARING RESTRICTIONS: No  FALLS:  Has patient fallen in last 6 months? No  LIVING ENVIRONMENT: Lives with: lives alone Lives in: House/apartment Stairs: No Has following equipment at home: Single point cane  OCCUPATION: Retired  PLOF: Independent and Leisure: mostly sedentary other than ADLs  PATIENT GOALS: "I want to be able to increase my activity, esp do more walking."   OBJECTIVE: (objective measures completed at initial evaluation unless otherwise dated)  DIAGNOSTIC FINDINGS:  DG Lumbar spine ordered  PATIENT SURVEYS:  Modified Oswestry 14 / 50 = 28.0 %  FOTO Lumbar - 44; predicted - 55  SCREENING FOR RED FLAGS: Bowel or bladder incontinence: No Spinal tumors: No Cauda equina syndrome: No Compression fracture: No Abdominal aneurysm: No  COGNITION:  Overall cognitive status: Within functional limits for tasks assessed    SENSATION: WFL - pt denies radicular numbness or tingling  MUSCLE LENGTH: Hamstrings: Mild tight B ITB: Mild tight B Piriformis: Mod/severe tight bil Hip flexors: Mild tight B Quads: Mild/mod tight B Heelcord: NT  POSTURE:  No Significant postural limitations  PALPATION: Increased muscle tension and TTP in bilateral lower lumbar paraspinals at lumbosacral junction, as well as R>L glutes and piriformis. Lumbosacral hypomobility also noted.  LUMBAR ROM:   Active  eval  Flexion Hands to just above ankles ^  Extension 30% limited ^  Right lateral flexion Hand to mid shin ^  Left lateral flexion Hand to fib head ^  Right rotation 25% limited ^  Left rotation 25% limited ^   (Blank rows = not tested, ^ = increased pain)  LOWER EXTREMITY ROM:    B LE essentially WNL except for limitations in B hip rotation  LOWER EXTREMITY MMT:    MMT Right eval Left eval  Hip flexion 4- 4-  Hip extension 3 3-  Hip abduction 4- 3+  Hip adduction 3- 3-  Hip internal rotation 4 4  Hip external rotation 4 4  Knee flexion 4+ 4+  Knee extension 4+ 4+  Ankle dorsiflexion 4+ 4+  Ankle plantarflexion    Ankle inversion    Ankle eversion     (Blank rows = not tested)  LUMBAR SPECIAL TESTS:  Straight leg raise test: Negative and Slump test: Negative  FUNCTIONAL TESTS: (TBA next visit) Timed up and go (TUG):   10 meter walk test:   Gait speed:  GAIT: Distance walked: 80 feet Assistive device utilized: None Level of assistance: Complete Independence Gait pattern: step to pattern, decreased stride length, decreased hip/knee flexion- Right, decreased hip/knee flexion- Left, and wide BOS Comments: Decreased gait speed  TODAY'S TREATMENT:   11/17/22 Eval only   PATIENT EDUCATION:  Education details: PT eval findings and anticipated POC Person educated: Patient Education method: Explanation Education comprehension: verbalized understanding  HOME EXERCISE PROGRAM: TBD   ASSESSMENT:  CLINICAL IMPRESSION: Marie Borowski is a 67 y.o. female who was seen today for physical therapy evaluation and treatment for lumbar radiculopathy.  She reports low back pain on and off for 2-3 years, but more recently notes onset of unpredictable B LE radicular pain following seemingly random patterns down her legs to her feet with no numbness or tingling reported.  The worst episode which caused her to go see the doctor occurred with severe cramping in her B inner thighs, L>R, which left her nearly unable to walk.  Current deficits include limited and painful lumbar ROM in all planes, decreased proximal LE flexibility, abnormal muscle tension and TTP in lumbar paraspinals and  glutes/piriformis, and B LE weakness.  She reports the pain and weakness have limited her stamina for exercise, especially walking, and is unable to walk for more than 10 to 15 minutes currently which limits her ADLs and community access for shopping and prevents her from walking for exercise.  Mariha will benefit from skilled PT to address above deficits to improve mobility and activity tolerance with decreased pain interference.   OBJECTIVE IMPAIRMENTS: Abnormal gait, decreased activity tolerance, decreased balance, decreased endurance, decreased knowledge of condition, decreased mobility, difficulty walking, decreased ROM, decreased strength, increased fascial restrictions, impaired perceived functional ability, increased muscle spasms, impaired flexibility, improper body mechanics, postural dysfunction, and pain.   ACTIVITY LIMITATIONS: carrying, lifting, bending, standing, stairs, and locomotion level  PARTICIPATION LIMITATIONS: meal prep, cleaning, laundry, driving, and community activity  PERSONAL FACTORS: Age, Fitness, Past/current experiences, Time since onset of injury/illness/exacerbation, and 3+ comorbidities: HTN, DM-II, Asthma, Crohn's disease, GERD, IBS, CKD, fatigue, hip pain  are also affecting patient's functional outcome.   REHAB POTENTIAL: Good  CLINICAL DECISION MAKING: Evolving/moderate complexity  EVALUATION COMPLEXITY: Moderate   GOALS: Goals reviewed with patient? Yes  SHORT TERM GOALS: Target date: 12/15/2022   Patient will be independent with initial HEP to improve outcomes and carryover.  Baseline: TBD Goal status: INITIAL  2.  Patient will report centralization of radicular symptoms.  Baseline: Intermittent B LE radicular pain down to ankles and feet with variable patterns Goal status: INITIAL  LONG TERM GOALS: Target date: 01/12/2023  Patient will be independent with ongoing/advanced HEP for self-management at home.  Baseline:  Goal status: INITIAL  2.   Patient will report 50-75% improvement in low back pain to improve QOL.  Baseline: 2-3/10 on eval, up to 8/10 at worst; B inner thigh pain/cramping "off the charts" when present Goal status: INITIAL  3.  Patient to demonstrate ability to achieve and maintain good spinal alignment/posturing and body mechanics needed for daily activities. Baseline:  Goal status: INITIAL  4.  Patient will demonstrate functional pain free lumbar ROM to perform ADLs.   Baseline: Limited and painful lumbar ROM in all planes - refer to above lumbar ROM table Goal status: INITIAL  5.  Patient will demonstrate improved B LE strength to >/= 4 to 4+/5 for improved stability and ease of mobility . Baseline: Refer to above LE MMT table Goal status: INITIAL  6.  Patient will report >/= 55 on lumbar FOTO to demonstrate improved functional ability.  Baseline: 44 Goal status: INITIAL   7. Patient will report </= 16% on Modified Oswestry to demonstrate improved functional ability with decreased  pain interference. Baseline: 14 / 50 = 28.0 % Goal status: INITIAL  8.  Patient will tolerate 20-30 min of walking w/o increased pain to allow for improved mobility and activity tolerance so that she can resume walking for exercise. Baseline: Walking limited to <10-15 minutes Goal status: INITIAL   PLAN:  PT FREQUENCY: 2x/week  PT DURATION: 8 weeks  PLANNED INTERVENTIONS: Therapeutic exercises, Therapeutic activity, Neuromuscular re-education, Balance training, Gait training, Patient/Family education, Self Care, Joint mobilization, Stair training, Aquatic Therapy, Dry Needling, Electrical stimulation, Spinal manipulation, Spinal mobilization, Cryotherapy, Moist heat, Taping, Traction, Ultrasound, Manual therapy, and Re-evaluation  PLAN FOR NEXT SESSION: Complete TUG and 10MWT/gait speed assessment; create initial HEP for lumbopelvic flexibility and strengthening; MT +/- DN to address abnormal muscle tension in lumbar  paraspinals and R>L glutes/piriformis   Marry Guan, PT 11/17/2022, 1:44 PM

## 2022-11-17 ENCOUNTER — Encounter: Payer: Self-pay | Admitting: Physical Therapy

## 2022-11-17 ENCOUNTER — Ambulatory Visit: Payer: PPO | Attending: Family | Admitting: Physical Therapy

## 2022-11-17 ENCOUNTER — Other Ambulatory Visit: Payer: Self-pay

## 2022-11-17 DIAGNOSIS — M5459 Other low back pain: Secondary | ICD-10-CM | POA: Diagnosis not present

## 2022-11-17 DIAGNOSIS — M5416 Radiculopathy, lumbar region: Secondary | ICD-10-CM | POA: Diagnosis not present

## 2022-11-17 DIAGNOSIS — M6281 Muscle weakness (generalized): Secondary | ICD-10-CM | POA: Diagnosis not present

## 2022-11-17 DIAGNOSIS — R262 Difficulty in walking, not elsewhere classified: Secondary | ICD-10-CM | POA: Insufficient documentation

## 2022-11-17 DIAGNOSIS — M62838 Other muscle spasm: Secondary | ICD-10-CM | POA: Diagnosis not present

## 2022-11-22 ENCOUNTER — Other Ambulatory Visit: Payer: Self-pay | Admitting: Gastroenterology

## 2022-11-24 ENCOUNTER — Ambulatory Visit (HOSPITAL_BASED_OUTPATIENT_CLINIC_OR_DEPARTMENT_OTHER)
Admission: RE | Admit: 2022-11-24 | Discharge: 2022-11-24 | Disposition: A | Discharge status: Home / Self Care | Payer: PPO | Source: Ambulatory Visit | Attending: Family | Admitting: Family

## 2022-11-24 ENCOUNTER — Inpatient Hospital Stay: Payer: PPO | Attending: Hematology & Oncology

## 2022-11-24 ENCOUNTER — Inpatient Hospital Stay: Payer: PPO | Admitting: Hematology & Oncology

## 2022-11-24 ENCOUNTER — Encounter: Payer: Self-pay | Admitting: Hematology & Oncology

## 2022-11-24 VITALS — BP 144/45 | HR 70 | Temp 98.8°F | Resp 18 | Ht 59.0 in | Wt 211.0 lb

## 2022-11-24 DIAGNOSIS — K5090 Crohn's disease, unspecified, without complications: Secondary | ICD-10-CM | POA: Diagnosis not present

## 2022-11-24 DIAGNOSIS — Z79899 Other long term (current) drug therapy: Secondary | ICD-10-CM | POA: Diagnosis not present

## 2022-11-24 DIAGNOSIS — M4726 Other spondylosis with radiculopathy, lumbar region: Secondary | ICD-10-CM | POA: Diagnosis not present

## 2022-11-24 DIAGNOSIS — R197 Diarrhea, unspecified: Secondary | ICD-10-CM | POA: Insufficient documentation

## 2022-11-24 DIAGNOSIS — E611 Iron deficiency: Secondary | ICD-10-CM | POA: Insufficient documentation

## 2022-11-24 DIAGNOSIS — R779 Abnormality of plasma protein, unspecified: Secondary | ICD-10-CM | POA: Diagnosis not present

## 2022-11-24 DIAGNOSIS — K5900 Constipation, unspecified: Secondary | ICD-10-CM | POA: Insufficient documentation

## 2022-11-24 DIAGNOSIS — M5416 Radiculopathy, lumbar region: Secondary | ICD-10-CM | POA: Insufficient documentation

## 2022-11-24 DIAGNOSIS — M5116 Intervertebral disc disorders with radiculopathy, lumbar region: Secondary | ICD-10-CM | POA: Diagnosis not present

## 2022-11-24 LAB — CMP (CANCER CENTER ONLY)
ALT: 7 U/L (ref 0–44)
AST: 11 U/L — ABNORMAL LOW (ref 15–41)
Albumin: 3.8 g/dL (ref 3.5–5.0)
Alkaline Phosphatase: 95 U/L (ref 38–126)
Anion gap: 8 (ref 5–15)
BUN: 25 mg/dL — ABNORMAL HIGH (ref 8–23)
CO2: 30 mmol/L (ref 22–32)
Calcium: 9.8 mg/dL (ref 8.9–10.3)
Chloride: 102 mmol/L (ref 98–111)
Creatinine: 1.62 mg/dL — ABNORMAL HIGH (ref 0.44–1.00)
GFR, Estimated: 34 mL/min — ABNORMAL LOW (ref >60)
Glucose, Bld: 169 mg/dL — ABNORMAL HIGH (ref 70–99)
Potassium: 3.9 mmol/L (ref 3.5–5.1)
Sodium: 140 mmol/L (ref 135–145)
Total Bilirubin: 0.3 mg/dL (ref 0.3–1.2)
Total Protein: 8.6 g/dL — ABNORMAL HIGH (ref 6.5–8.1)

## 2022-11-24 LAB — CBC WITH DIFFERENTIAL (CANCER CENTER ONLY)
Abs Immature Granulocytes: 0.03 10*3/uL (ref 0.00–0.07)
Basophils Absolute: 0 10*3/uL (ref 0.0–0.1)
Basophils Relative: 0 %
Eosinophils Absolute: 0.1 10*3/uL (ref 0.0–0.5)
Eosinophils Relative: 2 %
HCT: 35.3 % — ABNORMAL LOW (ref 36.0–46.0)
Hemoglobin: 10.7 g/dL — ABNORMAL LOW (ref 12.0–15.0)
Immature Granulocytes: 0 %
Lymphocytes Relative: 8 %
Lymphs Abs: 0.6 10*3/uL — ABNORMAL LOW (ref 0.7–4.0)
MCH: 21.7 pg — ABNORMAL LOW (ref 26.0–34.0)
MCHC: 30.3 g/dL (ref 30.0–36.0)
MCV: 71.7 fL — ABNORMAL LOW (ref 80.0–100.0)
Monocytes Absolute: 0.6 10*3/uL (ref 0.1–1.0)
Monocytes Relative: 9 %
Neutro Abs: 5.4 10*3/uL (ref 1.7–7.7)
Neutrophils Relative %: 81 %
Platelet Count: 349 10*3/uL (ref 150–400)
RBC: 4.92 MIL/uL (ref 3.87–5.11)
RDW: 18.3 % — ABNORMAL HIGH (ref 11.5–15.5)
WBC Count: 6.7 10*3/uL (ref 4.0–10.5)
nRBC: 0 % (ref 0.0–0.2)

## 2022-11-24 LAB — FERRITIN: Ferritin: 26 ng/mL (ref 11–307)

## 2022-11-24 LAB — LACTATE DEHYDROGENASE: LDH: 153 U/L (ref 98–192)

## 2022-11-24 LAB — SAVE SMEAR(SSMR), FOR PROVIDER SLIDE REVIEW

## 2022-11-24 NOTE — Progress Notes (Signed)
Referral MD  Reason for Referral: Crohn's disease; abnormal proteins; iron deficiency  Chief Complaint  Patient presents with   New Patient (Initial Visit)  : I was told that I have abnormal proteins.  HPI: Kathy [name] is a very nice 67 year old African-American female.  She has a interesting history.  She has a history of Crohn's disease.  She, thankfully, has never had surgery for this.  I do not think is ever caused any kind of obstruction.  She is on medication for this.  I think she takes Remicade and Imuran.  She apparently was diagnosed of the about 5 years ago.  She is followed by Gastroenterology.  She does have apparently vitamin B-12 deficiency.  She is being treated by her family doctor.  She had lab work done recently.  Not sure exactly why a protein electrophoresis was done.  However, there is no evidence of a monoclonal protein.  She has some elevated globulins which are not surprised by given her Crohn's disease.  She does have iron deficiency.  She has been started on some oral iron.  Back on 11/02/2022, she had a ferritin of 27.  Her iron saturation was 12%.  She has had no issues with weight loss.  She has had no problems with COVID.  She does not smoke.  There is no history of sickle cell.  There is no blood in the family who has any type of issues with Crohn's disease.  She says she had a mammogram this year.  She has had a mammogram on 10/19/2022.  She has had no bleeding.  She does have diarrhea.  Currently, I would say that her performance status is probably ECOG 1.  Past Medical History:  Diagnosis Date   Anal stenosis    Asthma    09-23-2017  per pt has not done inhaler since May 2018, no insurance (QVAR bid and Ventolin prn)   B12 deficiency 01/05/2021   Crohn disease (HCC)    Diastolic dysfunction    per echo 06-23-2009  grade 2 diastolic dysfunction , ef 60-65%   Esophagitis    GERD (gastroesophageal reflux disease)    Hemorrhoids     Hiatal hernia    Hyperlipidemia    stopped meds due to side effects   Hypertension    09-23-2017  per pt has takes bp meds since May 2018 no insurance (losartan 100mg  qd, HCTZ 12.5mg  qd, and toprol 50 qd)   Microalbuminuria 06/01/2006   Qualifier: Diagnosis of  By: Allena Katz MD, Sejal     Type 2 diabetes mellitus (HCC)    09-23-2017 per pt has not taken diabetic meds since May 2018, no insurance (kombiglyze 5-1000mg  qd)  :   Past Surgical History:  Procedure Laterality Date   BREAST BIOPSY Bilateral    CARDIOVASCULAR STRESS TEST  07/29/2009   normal nuclear study w/ no ischemia/  normal LV function and wall motion,  ef 70%   CESAREAN SECTION  1980s   RECTAL BIOPSY N/A 10/03/2017   Procedure: POSSIBLE BIOPSY RECTAL;  Surgeon: Andria Meuse, MD;  Location: [facility];  Service: General;  Laterality: N/A;  :   Current Outpatient Medications:    albuterol (VENTOLIN HFA) 108 (90 Base) MCG/ACT inhaler, Inhale 1 puff into the lungs every 6 (six) hours as needed for wheezing or shortness of breath., Disp: 18 g, Rfl: 5   amLODipine (NORVASC) 10 MG tablet, Take 1 tablet by mouth once daily, Disp: 90 tablet, Rfl: 0   azaTHIOprine (IMURAN)  50 MG tablet, Take 2 tablets by mouth once daily, Disp: 60 tablet, Rfl: 3   clotrimazole-betamethasone (LOTRISONE) cream, Apply 1 application topically 2 (two) times daily., Disp: 30 g, Rfl: 1   Continuous Blood Gluc Receiver (FREESTYLE LIBRE 14 DAY READER) DEVI, Use as directed, Disp: 1 each, Rfl: 0   Continuous Blood Gluc Sensor (FREESTYLE LIBRE 14 DAY SENSOR) MISC, Change every 14 days., Disp: 6 each, Rfl: 5   dicyclomine (BENTYL) 10 MG capsule, TAKE 1 CAPSULE BY MOUTH THREE TIMES DAILY AS NEEDED FOR  CRAMPING, Disp: 90 capsule, Rfl: 2   glipiZIDE (GLUCOTROL XL) 10 MG 24 hr tablet, Take 1 tablet (10 mg total) by mouth daily with breakfast., Disp: 90 tablet, Rfl: 0   hydrochlorothiazide (MICROZIDE) 12.5 MG capsule, Take 1 capsule (12.5 mg total) by mouth  daily., Disp: 90 capsule, Rfl: 1   hydrocortisone (ANUSOL-HC) 25 MG suppository, Place 1 suppository (25 mg total) rectally at bedtime., Disp: 14 suppository, Rfl: 0   Iron, Ferrous Sulfate, 325 (65 Fe) MG TABS, Take 325 mg by mouth every other day., Disp: , Rfl:    lubiprostone (AMITIZA) 8 MCG capsule, Take 1 capsule (8 mcg total) by mouth 2 (two) times daily with a meal. Please keep your June appointment for further refills. Thank you, Disp: 60 capsule, Rfl: 1   metoprolol succinate (TOPROL-XL) 50 MG 24 hr tablet, Take 1 tablet (50 mg total) by mouth daily. Take with or immediately following a meal, Disp: 90 tablet, Rfl: 1   Multiple Vitamins-Minerals (MULTIVITAMIN WITH MINERALS) tablet, Take 1 tablet by mouth daily., Disp: , Rfl:    omeprazole (PRILOSEC) 40 MG capsule, Take 1 capsule by mouth once daily, Disp: 90 capsule, Rfl: 0   pioglitazone (ACTOS) 30 MG tablet, Take 1 tablet (30 mg total) by mouth daily., Disp: 90 tablet, Rfl: 1   polyethylene glycol (MIRALAX / GLYCOLAX) 17 g packet, Take 17 g by mouth as needed. 1 capful prn, Disp: 30 each, Rfl: 11   REMICADE 100 MG injection, Inject 1,000 mg into the vein every 6 (six) weeks., Disp: , Rfl:    simvastatin (ZOCOR) 10 MG tablet, Take 1 tablet by mouth once daily, Disp: 90 tablet, Rfl: 0   sitaGLIPtin (JANUVIA) 50 MG tablet, Take 1 tablet (50 mg total) by mouth daily., Disp: 90 tablet, Rfl: 1   valsartan (DIOVAN) 40 MG tablet, Take 1 tablet by mouth once daily, Disp: 90 tablet, Rfl: 0   vitamin B-12 (CYANOCOBALAMIN) 1000 MCG tablet, Take 1 tablet (1,000 mcg total) by mouth daily., Disp: 1 tablet, Rfl: 0   colchicine 0.6 MG tablet, Take 2 tabs by mouth now and 1 tab in 1 hour. (Patient not taking: Reported on 11/24/2022), Disp: 6 tablet, Rfl: 1   COVID-19 mRNA vaccine 2023-2024 (COMIRNATY) syringe, Inject into the muscle., Disp: 0.3 mL, Rfl: 0:  :   Allergies  Allergen Reactions   Metformin Nausea Only   Lipitor [Atorvastatin Calcium]      Leg cramps    Lisinopril Itching   Pravachol [Pravastatin Sodium]    Pravastatin Sodium Other (See Comments)    leg cramps  :   Family History  Problem Relation Age of Onset   Hypertension Mother 13   Stroke Mother 56   Diabetes Maternal Aunt        s/p bilater amputation due to DM   Asthma Father    Colon cancer Neg Hx    Esophageal cancer Neg Hx    Stomach cancer Neg  Hx    Rectal cancer Neg Hx   :   Social History   Socioeconomic History   Marital status: Married    Spouse name: Not on file   Number of children: 2   Years of education: Not on file   Highest education level: Not on file  Occupational History   Occupation: unemployed  Tobacco Use   Smoking status: Never   Smokeless tobacco: Never  Vaping Use   Vaping status: Never Used  Substance and Sexual Activity   Alcohol use: No   Drug use: No   Sexual activity: Not Currently  Other Topics Concern   Not on file  Social History Narrative   Not working currently. Retired in 2019. Previously worked in [employer] job   Married    2 children (son and daughter). They are both local   She has 1 grand-daughter and 1 grand-son   No pets (allergic)   Completed 4 years of college.   Social Determinants of Health   Financial Resource Strain: Low Risk  (08/13/2022)   Overall Financial Resource Strain (CARDIA)    Difficulty of Paying Living Expenses: Not hard at all  Food Insecurity: No Food Insecurity (11/24/2022)   Hunger Vital Sign    Worried About Running Out of Food in the Last Year: Never true    Ran Out of Food in the Last Year: Never true  Transportation Needs: No Transportation Needs (11/24/2022)   PRAPARE - Administrator, Civil Service (Medical): No    Lack of Transportation (Non-Medical): No  Physical Activity: Inactive (08/13/2022)   Exercise Vital Sign    Days of Exercise per Week: 0 days    Minutes of Exercise per Session: 0 min  Stress: No Stress Concern Present (08/13/2022)    [employer] of Occupational Health - Occupational Stress Questionnaire    Feeling of Stress : Only a little  Social Connections: Moderately Integrated (08/13/2022)   Social Connection and Isolation Panel [NHANES]    Frequency of Communication with Friends and Family: More than three times a week    Frequency of Social Gatherings with Friends and Family: Twice a week    Attends Religious Services: More than 4 times per year    Active Member of [employer] or Organizations: No    Attends Banker Meetings: Never    Marital Status: Married  Catering manager Violence: Not At Risk (11/24/2022)   Humiliation, Afraid, Rape, and Kick questionnaire    Fear of Current or Ex-Partner: No    Emotionally Abused: No    Physically Abused: No    Sexually Abused: No  :  Review of Systems  Constitutional: Negative.   HENT: Negative.    Eyes: Negative.   Respiratory: Negative.    Cardiovascular: Negative.   Gastrointestinal:  Positive for diarrhea.  Genitourinary: Negative.   Musculoskeletal:  Positive for joint pain and myalgias.  Skin: Negative.   Neurological: Negative.   Endo/Heme/Allergies: Negative.   Psychiatric/Behavioral: Negative.       Exam: Vital signs show temperature of 98.8.  Pulse 70.  Blood pressure 144/45.  Weight is 211 pounds.  @IPVITALS @ Physical Exam Vitals reviewed.  HENT:     Head: Normocephalic and atraumatic.  Eyes:     Pupils: Pupils are equal, round, and reactive to light.  Cardiovascular:     Rate and Rhythm: Normal rate and regular rhythm.     Heart sounds: Normal heart sounds.  Pulmonary:     Effort:  Pulmonary effort is normal.     Breath sounds: Normal breath sounds.  Abdominal:     General: Bowel sounds are normal.     Palpations: Abdomen is soft.  Musculoskeletal:        General: No tenderness or deformity. Normal range of motion.     Cervical back: Normal range of motion.  Lymphadenopathy:     Cervical: No cervical adenopathy.  Skin:     General: Skin is warm and dry.     Findings: No erythema or rash.  Neurological:     Mental Status: She is alert and oriented to person, place, and time.  Psychiatric:        Behavior: Behavior normal.        Thought Content: Thought content normal.        Judgment: Judgment normal.     Recent Labs    11/24/22 1349  WBC 6.7  HGB 10.7*  HCT 35.3*  PLT 349    Recent Labs    11/24/22 1349  NA 140  K 3.9  CL 102  CO2 30  GLUCOSE 169*  BUN 25*  CREATININE 1.62*  CALCIUM 9.8    Blood smear review: Hypochromic and microcytic red blood cells.  I see no rouleaux formation.  There is no nucleated red blood cells.  I see no inclusion bodies.  There are no schistocytes or spherocytes.  I see no target cells.  White blood cells appear normal in morphology maturation.  There is no immature myeloid or lymphoid forms.  There is no hypersegmented polys.  Platelets are adequate in number and size.  Pathology: None    Assessment and Plan: Kathy [name] is a very nice 67 year old Afro-American female.  She has Crohn's disease.  She clearly is iron deficient.  Her blood smear is consistent with iron deficiency.  Her low MCV also suggest this.  I think that iron deficiency probably is can be her biggest issue.  I suspect that the "abnormal" proteins might be reactive and reflective of the Crohn's disease.  We will have to see what a SPEP shows.  We will see what her immunoglobulins show.  For right now, I really do not think we have to be all that aggressive with our workup.  She is just started on oral iron she says so she does not think she would need IV iron.  She is little constipated from the oral iron that she says.  This I think is interesting since she has some diarrhea.  She again, she is very nice.  I thoroughly enjoyed talking to her.  We will have to plan for follow-up depending on what we find with our labs.

## 2022-11-30 ENCOUNTER — Other Ambulatory Visit: Payer: Self-pay

## 2022-11-30 ENCOUNTER — Ambulatory Visit: Payer: PPO | Attending: Family

## 2022-11-30 DIAGNOSIS — M62838 Other muscle spasm: Secondary | ICD-10-CM | POA: Diagnosis not present

## 2022-11-30 DIAGNOSIS — M5459 Other low back pain: Secondary | ICD-10-CM | POA: Diagnosis not present

## 2022-11-30 DIAGNOSIS — M5416 Radiculopathy, lumbar region: Secondary | ICD-10-CM | POA: Insufficient documentation

## 2022-11-30 DIAGNOSIS — M6281 Muscle weakness (generalized): Secondary | ICD-10-CM | POA: Diagnosis not present

## 2022-11-30 DIAGNOSIS — R262 Difficulty in walking, not elsewhere classified: Secondary | ICD-10-CM | POA: Diagnosis not present

## 2022-11-30 NOTE — Therapy (Signed)
OUTPATIENT PHYSICAL THERAPY THORACOLUMBAR TREATMENT   Patient Name: Kathy [name] MRN: [id number] DOB:1955-08-02, 67 y.o., female Today's Date: 11/30/2022  END OF SESSION:  PT End of Session - 11/30/22 1025     Visit Number 2    Date for PT Re-Evaluation 01/12/23    Progress Note Due on Visit 10    PT Start Time 1020    PT Stop Time 1100    PT Time Calculation (min) 40 min    Activity Tolerance Patient tolerated treatment well    Behavior During Therapy [facility] for tasks assessed/performed              Past Medical History:  Diagnosis Date   Anal stenosis    Asthma    09-23-2017  per pt has not done inhaler since May 2018, no insurance (QVAR bid and Ventolin prn)   B12 deficiency 01/05/2021   Crohn disease (HCC)    Diastolic dysfunction    per echo 06-23-2009  grade 2 diastolic dysfunction , ef 60-65%   Esophagitis    GERD (gastroesophageal reflux disease)    Hemorrhoids    Hiatal hernia    Hyperlipidemia    stopped meds due to side effects   Hypertension    09-23-2017  per pt has takes bp meds since May 2018 no insurance (losartan 100mg  qd, HCTZ 12.5mg  qd, and toprol 50 qd)   Microalbuminuria 06/01/2006   Qualifier: Diagnosis of  By: Allena Katz MD, Sejal     Type 2 diabetes mellitus (HCC)    09-23-2017 per pt has not taken diabetic meds since May 2018, no insurance (kombiglyze 5-1000mg  qd)   Past Surgical History:  Procedure Laterality Date   BREAST BIOPSY Bilateral    CARDIOVASCULAR STRESS TEST  07/29/2009   normal nuclear study w/ no ischemia/  normal LV function and wall motion,  ef 70%   CESAREAN SECTION  1980s   RECTAL BIOPSY N/A 10/03/2017   Procedure: POSSIBLE BIOPSY RECTAL;  Surgeon: Andria Meuse, MD;  Location: [facility];  Service: General;  Laterality: N/A;   Patient Active Problem List   Diagnosis Date Noted   Abnormal SPEP 11/06/2022   Fatigue 10/23/2022   Lower extremity edema 10/23/2022   Elevated serum protein level 10/23/2022    Uncontrolled type 2 diabetes mellitus with hyperglycemia (HCC) 07/06/2022   Irritable bowel syndrome with constipation 03/10/2022   Lumbar radiculopathy 10/28/2021   Chronic kidney disease, stage 3b (HCC) 07/09/2021   Controlled type 2 diabetes mellitus without complication, without long-term current use of insulin (HCC) 07/09/2021   Hip pain 07/09/2021   B12 deficiency 01/05/2021   Tinea pedis of both feet 10/03/2020   Healthcare maintenance 05/19/2018   Crohn disease (HCC) 03/09/2018   Diastolic dysfunction 03/09/2018   GERD (gastroesophageal reflux disease) 03/09/2018   Migraine without aura 07/17/2008   Microalbuminuria 06/01/2006   Hyperlipidemia 05/30/2006   Essential hypertension 05/30/2006   Asthma 05/30/2006    PCP: Sandford Craze, NP   REFERRING PROVIDER: Sandford Craze, NP   REFERRING DIAG: M54.16 (ICD-10-CM) - Lumbar radiculopathy   THERAPY DIAG:  Radiculopathy, lumbar region  Other low back pain  Muscle weakness (generalized)  Other muscle spasm  Difficulty in walking, not elsewhere classified  RATIONALE FOR EVALUATION AND TREATMENT: Rehabilitation  ONSET DATE: chronic "on and off"  NEXT MD VISIT: 01/24/23   SUBJECTIVE:  SUBJECTIVE STATEMENT: 11/30/22: not much change in her Sx.  She went to store, ran errands yesterday and barely made it back into the house.  Had to take a tylenol to improve her LBP.  Reports standing and walking are the most provocative activities in regard to her Sx Pt reports the complaint that led to her PT referral was leg cramps in her B inner thighs - pain so intense that it was off the scale and almost caused her to faint. Low back pain has been an issue "on and off" for 2-3 years. Radicular pain varies in pattern and intensity,  but does not note any specific numbness or tingling. She states her PCP wants her to be more active but she does not feel that she has the stamina to walk due to weakness - unable to walk for more than 10-15 minutes.  PAIN: Are you having pain? Yes: NPRS scale: 2-3/10 currently, at worst 8/10 Pain location: B low back  Pain description: constant ache Aggravating factors: movement - bending, lifting Relieving factors: heat  PERTINENT HISTORY:  HTN, DM-II, Asthma, Crohn's disease, GERD, IBS, CKD, fatigue, hip pain  PRECAUTIONS: None  RED FLAGS: None  WEIGHT BEARING RESTRICTIONS: No  FALLS:  Has patient fallen in last 6 months? No  LIVING ENVIRONMENT: Lives with: lives alone Lives in: House/apartment Stairs: No Has following equipment at home: Single point cane  OCCUPATION: Retired  PLOF: Independent and Leisure: mostly sedentary other than ADLs  PATIENT GOALS: "I want to be able to increase my activity, esp do more walking."   OBJECTIVE: (objective measures completed at initial evaluation unless otherwise dated)  DIAGNOSTIC FINDINGS:  DG Lumbar spine ordered  PATIENT SURVEYS:  Modified Oswestry 14 / 50 = 28.0 %  FOTO Lumbar - 44; predicted - 55  SCREENING FOR RED FLAGS: Bowel or bladder incontinence: No Spinal tumors: No Cauda equina syndrome: No Compression fracture: No Abdominal aneurysm: No  COGNITION:  Overall cognitive status: Within functional limits for tasks assessed    SENSATION: WFL - pt denies radicular numbness or tingling  MUSCLE LENGTH: Hamstrings: Mild tight B ITB: Mild tight B Piriformis: Mod/severe tight bil Hip flexors: Mild tight B Quads: Mild/mod tight B Heelcord: NT  POSTURE:  No Significant postural limitations  PALPATION: Increased muscle tension and TTP in bilateral lower lumbar paraspinals at lumbosacral junction, as well as R>L glutes and piriformis. Lumbosacral hypomobility also noted.  LUMBAR ROM:   Active  eval   Flexion Hands to just above ankles ^  Extension 30% limited ^  Right lateral flexion Hand to mid shin ^  Left lateral flexion Hand to fib head ^  Right rotation 25% limited ^  Left rotation 25% limited ^  (Blank rows = not tested, ^ = increased pain)  LOWER EXTREMITY ROM:    B LE essentially WNL except for limitations in B hip rotation  LOWER EXTREMITY MMT:    MMT Right eval Left eval  Hip flexion 4- 4-  Hip extension 3 3-  Hip abduction 4- 3+  Hip adduction 3- 3-  Hip internal rotation 4 4  Hip external rotation 4 4  Knee flexion 4+ 4+  Knee extension 4+ 4+  Ankle dorsiflexion 4+ 4+  Ankle plantarflexion    Ankle inversion    Ankle eversion     (Blank rows = not tested)  LUMBAR SPECIAL TESTS:  Straight leg raise test: Negative and Slump test: Negative  FUNCTIONAL TESTS: (TBA next visit) Timed up and go (TUG):  15.27 10 meter walk test: 13.15 Gait speed:  GAIT: Distance walked: 80 feet Assistive device utilized: None Level of assistance: Complete Independence Gait pattern: step to pattern, decreased stride length, decreased hip/knee flexion- Right, decreased hip/knee flexion- Left, and wide BOS Comments: Decreased gait speed   TODAY'S TREATMENT:  11/30/22:  Therapeutic exercise:  Instructed in the following ex to assist the patient in improving her lumbar mobility, flexibility, and hip mobility and thereby improve her tolerance to activity/ pain.  Education throughout on rationale , purpose of each ex.: Nustep, Ue's and LE's, level 5, 6 min Supine for LTR Supine pelvic tilt, patient needed cues to avoid holding breath, and to engage transverse abdominus correctly Supine alt knee to chest from hooklying position Supine hooklying hip fall outs  All ex 10 to 15 reps  Completed TUG and 10 M walk test as indicated above  11/17/22 Eval only   PATIENT EDUCATION:  Education details: PT eval findings and anticipated POC Person educated: Patient Education method:  Explanation Education comprehension: verbalized understanding  HOME EXERCISE PROGRAM: TBD Access Code: [email or web address] URL: [email or web address] Date: 11/30/2022 Prepared by:    Exercises - Supine Lower Trunk Rotation  - 1 x daily - 7 x weekly - 3 sets - 10 reps - Supine Pelvic Tilt  - 1 x daily - 7 x weekly - 3 sets - 10 reps - Hooklying Single Knee to Chest  - 1 x daily - 7 x weekly - 3 sets - 10 reps - Bent Knee Fallouts  - 1 x daily - 7 x weekly - 3 sets - 10 reps  ASSESSMENT:  CLINICAL IMPRESSION: Kathy [name] is a 67 y.o. female who returned today for skilled physical therapy treatment for lumbar radiculopathy. She presented with diffuse LE edema.  Had some difficulty performing the pelvic tilt primarily due to her Chron's and fear of bowel incontinence.  She is quite stiff  B hips, careful, guarded with all movements, bed mobility.  Did discuss dry needling with her for pain management but she deferred, stated she doesn't like needles.  Chaunte will benefit from skilled PT to address above deficits to improve mobility and activity tolerance with decreased pain interference.   OBJECTIVE IMPAIRMENTS: Abnormal gait, decreased activity tolerance, decreased balance, decreased endurance, decreased knowledge of condition, decreased mobility, difficulty walking, decreased ROM, decreased strength, increased fascial restrictions, impaired perceived functional ability, increased muscle spasms, impaired flexibility, improper body mechanics, postural dysfunction, and pain.   ACTIVITY LIMITATIONS: carrying, lifting, bending, standing, stairs, and locomotion level  PARTICIPATION LIMITATIONS: meal prep, cleaning, laundry, driving, and community activity  PERSONAL FACTORS: Age, Fitness, Past/current experiences, Time since onset of injury/illness/exacerbation, and 3+ comorbidities: HTN, DM-II, Asthma, Crohn's disease, GERD, IBS, CKD, fatigue, hip pain  are also affecting  patient's functional outcome.   REHAB POTENTIAL: Good  CLINICAL DECISION MAKING: Evolving/moderate complexity  EVALUATION COMPLEXITY: Moderate   GOALS: Goals reviewed with patient? Yes  SHORT TERM GOALS: Target date: 12/15/2022   Patient will be independent with initial HEP to improve outcomes and carryover.  Baseline: TBD Goal status: INITIAL  2.  Patient will report centralization of radicular symptoms.  Baseline: Intermittent B LE radicular pain down to ankles and feet with variable patterns Goal status: INITIAL  LONG TERM GOALS: Target date: 01/12/2023  Patient will be independent with ongoing/advanced HEP for self-management at home.  Baseline:  Goal status: INITIAL  2.  Patient will report 50-75% improvement in low back pain to improve QOL.  Baseline: 2-3/10 on eval,  up to 8/10 at worst; B inner thigh pain/cramping "off the charts" when present Goal status: INITIAL  3.  Patient to demonstrate ability to achieve and maintain good spinal alignment/posturing and body mechanics needed for daily activities. Baseline:  Goal status: INITIAL  4.  Patient will demonstrate functional pain free lumbar ROM to perform ADLs.   Baseline: Limited and painful lumbar ROM in all planes - refer to above lumbar ROM table Goal status: INITIAL  5.  Patient will demonstrate improved B LE strength to >/= 4 to 4+/5 for improved stability and ease of mobility . Baseline: Refer to above LE MMT table Goal status: INITIAL  6.  Patient will report >/= 55 on lumbar FOTO to demonstrate improved functional ability.  Baseline: 44 Goal status: INITIAL   7. Patient will report </= 16% on Modified Oswestry to demonstrate improved functional ability with decreased pain interference. Baseline: 14 / 50 = 28.0 % Goal status: INITIAL  8.  Patient will tolerate 20-30 min of walking w/o increased pain to allow for improved mobility and activity tolerance so that she can resume walking for  exercise. Baseline: Walking limited to <10-15 minutes Goal status: INITIAL   PLAN:  PT FREQUENCY: 2x/week  PT DURATION: 8 weeks  PLANNED INTERVENTIONS: Therapeutic exercises, Therapeutic activity, Neuromuscular re-education, Balance training, Gait training, Patient/Family education, Self Care, Joint mobilization, Stair training, Aquatic Therapy, Dry Needling, Electrical stimulation, Spinal manipulation, Spinal mobilization, Cryotherapy, Moist heat, Taping, Traction, Ultrasound, Manual therapy, and Re-evaluation  PLAN FOR NEXT SESSION: ; MT  to address abnormal muscle tension in lumbar paraspinals and R>L glutes/piriformis, how was home ex program, possibly hip mobs.    L , PT 11/30/2022, 12:56 PM

## 2022-12-01 ENCOUNTER — Other Ambulatory Visit: Payer: Self-pay | Admitting: Family

## 2022-12-01 DIAGNOSIS — I119 Hypertensive heart disease without heart failure: Secondary | ICD-10-CM

## 2022-12-02 ENCOUNTER — Ambulatory Visit (INDEPENDENT_AMBULATORY_CARE_PROVIDER_SITE_OTHER): Payer: PPO

## 2022-12-02 VITALS — BP 111/72 | HR 57 | Temp 97.8°F | Resp 16 | Ht <= 58 in | Wt 208.6 lb

## 2022-12-02 DIAGNOSIS — K50111 Crohn's disease of large intestine with rectal bleeding: Secondary | ICD-10-CM | POA: Diagnosis not present

## 2022-12-02 MED ORDER — DIPHENHYDRAMINE HCL 25 MG PO CAPS
25.0000 mg | ORAL_CAPSULE | Freq: Once | ORAL | Status: AC
  Administered 2022-12-02: 25 mg via ORAL
  Filled 2022-12-02: qty 1

## 2022-12-02 MED ORDER — ACETAMINOPHEN 325 MG PO TABS
650.0000 mg | ORAL_TABLET | Freq: Once | ORAL | Status: AC
  Administered 2022-12-02: 650 mg via ORAL
  Filled 2022-12-02: qty 2

## 2022-12-02 MED ORDER — SODIUM CHLORIDE 0.9 % IV SOLN
10.0000 mg/kg | Freq: Once | INTRAVENOUS | Status: AC
  Administered 2022-12-02: 1000 mg via INTRAVENOUS
  Filled 2022-12-02: qty 100

## 2022-12-02 NOTE — Progress Notes (Signed)
Diagnosis: Crohn's Disease  Provider:  Chilton Greathouse MD  Procedure: IV Infusion  IV Type: Peripheral, IV Location: R Forearm  Remicade (Infliximab), Dose: 1000 ml  Infusion Start Time: 1101  Infusion Stop Time: 1317  Post Infusion IV Care: Observation period completed. PIV removed.  Discharge: Condition: Good, Destination: Home . AVS provided.  Performed by:  Rico Ala, LPN

## 2022-12-07 NOTE — Therapy (Signed)
OUTPATIENT PHYSICAL THERAPY TREATMENT   Patient Name: Kathy [name] MRN: [id number] DOB:06-Apr-1956, 67 y.o., female Today's Date: 12/08/2022  END OF SESSION:  PT End of Session - 12/08/22 1017     Visit Number 3    Date for PT Re-Evaluation 01/12/23    Progress Note Due on Visit 10    PT Start Time 1017    PT Stop Time 1103    PT Time Calculation (min) 46 min    Activity Tolerance Patient tolerated treatment well    Behavior During Therapy [facility] for tasks assessed/performed               Past Medical History:  Diagnosis Date   Anal stenosis    Asthma    09-23-2017  per pt has not done inhaler since May 2018, no insurance (QVAR bid and Ventolin prn)   B12 deficiency 01/05/2021   Crohn disease (HCC)    Diastolic dysfunction    per echo 06-23-2009  grade 2 diastolic dysfunction , ef 60-65%   Esophagitis    GERD (gastroesophageal reflux disease)    Hemorrhoids    Hiatal hernia    Hyperlipidemia    stopped meds due to side effects   Hypertension    09-23-2017  per pt has takes bp meds since May 2018 no insurance (losartan 100mg  qd, HCTZ 12.5mg  qd, and toprol 50 qd)   Microalbuminuria 06/01/2006   Qualifier: Diagnosis of  By: Allena Katz MD, Sejal     Type 2 diabetes mellitus (HCC)    09-23-2017 per pt has not taken diabetic meds since May 2018, no insurance (kombiglyze 5-1000mg  qd)   Past Surgical History:  Procedure Laterality Date   BREAST BIOPSY Bilateral    CARDIOVASCULAR STRESS TEST  07/29/2009   normal nuclear study w/ no ischemia/  normal LV function and wall motion,  ef 70%   CESAREAN SECTION  1980s   RECTAL BIOPSY N/A 10/03/2017   Procedure: POSSIBLE BIOPSY RECTAL;  Surgeon: Andria Meuse, MD;  Location: [facility];  Service: General;  Laterality: N/A;   Patient Active Problem List   Diagnosis Date Noted   Abnormal SPEP 11/06/2022   Fatigue 10/23/2022   Lower extremity edema 10/23/2022   Elevated serum protein level 10/23/2022   Uncontrolled type 2  diabetes mellitus with hyperglycemia (HCC) 07/06/2022   Irritable bowel syndrome with constipation 03/10/2022   Lumbar radiculopathy 10/28/2021   Chronic kidney disease, stage 3b (HCC) 07/09/2021   Controlled type 2 diabetes mellitus without complication, without long-term current use of insulin (HCC) 07/09/2021   Hip pain 07/09/2021   B12 deficiency 01/05/2021   Tinea pedis of both feet 10/03/2020   Healthcare maintenance 05/19/2018   Crohn disease (HCC) 03/09/2018   Diastolic dysfunction 03/09/2018   GERD (gastroesophageal reflux disease) 03/09/2018   Migraine without aura 07/17/2008   Microalbuminuria 06/01/2006   Hyperlipidemia 05/30/2006   Essential hypertension 05/30/2006   Asthma 05/30/2006    PCP: Sandford Craze, NP   REFERRING PROVIDER: Sandford Craze, NP   REFERRING DIAG: M54.16 (ICD-10-CM) - Lumbar radiculopathy   THERAPY DIAG:  Radiculopathy, lumbar region  Other low back pain  Muscle weakness (generalized)  Other muscle spasm  Difficulty in walking, not elsewhere classified  RATIONALE FOR EVALUATION AND TREATMENT: Rehabilitation  ONSET DATE: chronic "on and off"  NEXT MD VISIT: 01/24/23   SUBJECTIVE:  SUBJECTIVE STATEMENT:  Pt reports she still has more difficulty on days where she runs errands (on her feet, standing and walking more). Pt reports performing the HEP exercises 2x/day (morning & night) but is not sure is she is doing the pelvic tilt correctly.  EVAL:  Pt reports the complaint that led to her PT referral was leg cramps in her B inner thighs - pain so intense that it was off the scale and almost caused her to faint. Low back pain has been an issue "on and off" for 2-3 years. Radicular pain varies in pattern and intensity, but does not note  any specific numbness or tingling. She states her PCP wants her to be more active but she does not feel that she has the stamina to walk due to weakness - unable to walk for more than 10-15 minutes.  PAIN: Are you having pain? Yes: NPRS scale: 2/10 currently, at worst 7-8/10 Pain location: B low back  Pain description: constant ache Aggravating factors: movement - bending, lifting Relieving factors: heat  PERTINENT HISTORY:  HTN, DM-II, Asthma, Crohn's disease, GERD, IBS, CKD, fatigue, hip pain  PRECAUTIONS: None  RED FLAGS: None  WEIGHT BEARING RESTRICTIONS: No  FALLS:  Has patient fallen in last 6 months? No  LIVING ENVIRONMENT: Lives with: lives alone Lives in: House/apartment Stairs: No Has following equipment at home: Single point cane  OCCUPATION: Retired  PLOF: Independent and Leisure: mostly sedentary other than ADLs  PATIENT GOALS: "I want to be able to increase my activity, esp do more walking."   OBJECTIVE: (objective measures completed at initial evaluation unless otherwise dated)  DIAGNOSTIC FINDINGS:  DG Lumbar spine ordered  PATIENT SURVEYS:  Modified Oswestry 14 / 50 = 28.0 %  FOTO Lumbar - 44; predicted - 55  SCREENING FOR RED FLAGS: Bowel or bladder incontinence: No Spinal tumors: No Cauda equina syndrome: No Compression fracture: No Abdominal aneurysm: No  COGNITION:  Overall cognitive status: Within functional limits for tasks assessed    SENSATION: WFL - pt denies radicular numbness or tingling  MUSCLE LENGTH: Hamstrings: Mild tight B ITB: Mild tight B Piriformis: Mod/severe tight bil Hip flexors: Mild tight B Quads: Mild/mod tight B Heelcord: NT  POSTURE:  No Significant postural limitations  PALPATION: Increased muscle tension and TTP in bilateral lower lumbar paraspinals at lumbosacral junction, as well as R>L glutes and piriformis. Lumbosacral hypomobility also noted.  LUMBAR ROM:   Active  eval  Flexion Hands to just  above ankles ^  Extension 30% limited ^  Right lateral flexion Hand to mid shin ^  Left lateral flexion Hand to fib head ^  Right rotation 25% limited ^  Left rotation 25% limited ^  (Blank rows = not tested, ^ = increased pain)  LOWER EXTREMITY ROM:    B LE essentially WNL except for limitations in B hip rotation  LOWER EXTREMITY MMT:    MMT Right eval Left eval  Hip flexion 4- 4-  Hip extension 3 3-  Hip abduction 4- 3+  Hip adduction 3- 3-  Hip internal rotation 4 4  Hip external rotation 4 4  Knee flexion 4+ 4+  Knee extension 4+ 4+  Ankle dorsiflexion 4+ 4+  Ankle plantarflexion    Ankle inversion    Ankle eversion     (Blank rows = not tested)  LUMBAR SPECIAL TESTS:  Straight leg raise test: Negative and Slump test: Negative  FUNCTIONAL TESTS:  Timed up and go (TUG): 15.27 sec 10 meter  walk test: 13.15 sec Gait speed: 2.49 ft/sec  GAIT: Distance walked: 80 feet Assistive device utilized: None Level of assistance: Complete Independence Gait pattern: step to pattern, decreased stride length, decreased hip/knee flexion- Right, decreased hip/knee flexion- Left, and wide BOS Comments: Decreased gait speed   TODAY'S TREATMENT:   12/08/22 THERAPEUTIC EXERCISE: to improve flexibility, strength and mobility.  Demonstration, verbal and tactile cues throughout for technique.  NuStep - L5 x 6 min (UE/LE)  Hooklying LTR 10 x 5" Hooklying pelvic tilt 10 x 3-5" - cues for pacing and self-awareness of TrA activation Hooklying SKTC stretch with folded pillowcase for extended reach 10 x 5" Hooklying alt bent-knee fallouts 10 x 3" cues to avoid trunk rotation Hooklying TrA/PPT + hip ADD ball squeeze isometric 8 x 5" - discontinued due to increased LBP Hookying TrA march 10 x 3" Seated hip ABD/ER with foot elevated on 9" stool for piriformis/glutes stretch 3 x 30" (unable to tolerate full figure-4 stretch position in sitting or supine)   11/30/22:  Therapeutic exercise:   Instructed in the following ex to assist the patient in improving her lumbar mobility, flexibility, and hip mobility and thereby improve her tolerance to activity/ pain.  Education throughout on rationale , purpose of each ex.: Nustep, Ue's and LE's, level 5, 6 min Supine for LTR Supine pelvic tilt, patient needed cues to avoid holding breath, and to engage transverse abdominus correctly Supine alt knee to chest from hooklying position Supine hooklying hip fall outs  All ex 10 to 15 reps  Completed TUG and 10 M walk test as indicated above   11/17/22 Eval only   PATIENT EDUCATION:  Education details: HEP review and HEP update Person educated: Patient Education method: Programmer, multimedia, Demonstration, Verbal cues, and Handouts Education comprehension: verbalized understanding, returned demonstration, verbal cues required, and needs further education  HOME EXERCISE PROGRAM: Access Code: [email or web address] URL: [email or web address] Date: 12/08/2022 Prepared by: Glenetta Hew  Exercises - Supine Lower Trunk Rotation  - 1 x daily - 7 x weekly - 3 sets - 10 reps - Supine Pelvic Tilt  - 1 x daily - 7 x weekly - 3 sets - 10 reps - Hooklying Single Knee to Chest  - 1 x daily - 7 x weekly - 3 sets - 10 reps - Bent Knee Fallouts  - 1 x daily - 7 x weekly - 3 sets - 10 reps - Supine March with Posterior Pelvic Tilt  - 1 x daily - 7 x weekly - 3 sets - 10 reps - 3 sec hold - Seated Piriformis Stretch  - 1-2 x daily - 7 x weekly - 3 reps - 30 sec hold - Standing Lumbar Extension at Wall - Forearms  - 1 x daily - 7 x weekly - 2 sets - 10 reps - 3 sec hold - Standing Lumbar Extension with Counter  - 1 x daily - 7 x weekly - 2 sets - 10 reps - 3 sec hold  ASSESSMENT:  CLINICAL IMPRESSION: Demeka requested clarification of pelvic tilt with HEP but reports good compliance. HEP reviewed providing requested clarification as well as slight modifications to improve tolerance (extended reach during  [facility]).  Progressed core/lumbopelvic strengthening and stretching to address abnormal muscle tension with exercises requiring some modification for tolerance.  Patient reporting preference for extension based movements when it comes to alleviating her pain, therefore we will continue to progress along these lines in upcoming visits.  Emalie will benefit from skilled PT to address above deficits  to improve mobility and activity tolerance with decreased pain interference.   OBJECTIVE IMPAIRMENTS: Abnormal gait, decreased activity tolerance, decreased balance, decreased endurance, decreased knowledge of condition, decreased mobility, difficulty walking, decreased ROM, decreased strength, increased fascial restrictions, impaired perceived functional ability, increased muscle spasms, impaired flexibility, improper body mechanics, postural dysfunction, and pain.   ACTIVITY LIMITATIONS: carrying, lifting, bending, standing, stairs, and locomotion level  PARTICIPATION LIMITATIONS: meal prep, cleaning, laundry, driving, and community activity  PERSONAL FACTORS: Age, Fitness, Past/current experiences, Time since onset of injury/illness/exacerbation, and 3+ comorbidities: HTN, DM-II, Asthma, Crohn's disease, GERD, IBS, CKD, fatigue, hip pain  are also affecting patient's functional outcome.   REHAB POTENTIAL: Good  CLINICAL DECISION MAKING: Evolving/moderate complexity  EVALUATION COMPLEXITY: Moderate   GOALS: Goals reviewed with patient? Yes  SHORT TERM GOALS: Target date: 12/15/2022   Patient will be independent with initial HEP to improve outcomes and carryover.  Baseline: TBD Goal status: IN PROGRESS  2.  Patient will report centralization of radicular symptoms.  Baseline: Intermittent B LE radicular pain down to ankles and feet with variable patterns Goal status: IN PROGRESS  LONG TERM GOALS: Target date: 01/12/2023  Patient will be independent with ongoing/advanced HEP for self-management at  home.  Baseline:  Goal status: IN PROGRESS  2.  Patient will report 50-75% improvement in low back pain to improve QOL.  Baseline: 2-3/10 on eval, up to 8/10 at worst; B inner thigh pain/cramping "off the charts" when present Goal status: IN PROGRESS  3.  Patient to demonstrate ability to achieve and maintain good spinal alignment/posturing and body mechanics needed for daily activities. Baseline:  Goal status: IN PROGRESS  4.  Patient will demonstrate functional pain free lumbar ROM to perform ADLs.   Baseline: Limited and painful lumbar ROM in all planes - refer to above lumbar ROM table Goal status: IN PROGRESS  5.  Patient will demonstrate improved B LE strength to >/= 4 to 4+/5 for improved stability and ease of mobility . Baseline: Refer to above LE MMT table Goal status: IN PROGRESS  6.  Patient will report >/= 55 on lumbar FOTO to demonstrate improved functional ability.  Baseline: 44 Goal status: IN PROGRESS   7. Patient will report </= 16% on Modified Oswestry to demonstrate improved functional ability with decreased pain interference. Baseline: 14 / 50 = 28.0 % Goal status: IN PROGRESS  8.  Patient will tolerate 20-30 min of walking w/o increased pain to allow for improved mobility and activity tolerance so that she can resume walking for exercise. Baseline: Walking limited to <10-15 minutes Goal status: IN PROGRESS   PLAN:  PT FREQUENCY: 2x/week  PT DURATION: 8 weeks  PLANNED INTERVENTIONS: Therapeutic exercises, Therapeutic activity, Neuromuscular re-education, Balance training, Gait training, Patient/Family education, Self Care, Joint mobilization, Stair training, Aquatic Therapy, Dry Needling, Electrical stimulation, Spinal manipulation, Spinal mobilization, Cryotherapy, Moist heat, Taping, Traction, Ultrasound, Manual therapy, and Re-evaluation  PLAN FOR NEXT SESSION: MT to address abnormal muscle tension in lumbar paraspinals and R>L glutes/piriformis,  possibly hip mobs; core/lumbopelvic flexibility and strengthening with extension-based preference.   Marry Guan, PT 12/08/2022, 11:03 AM

## 2022-12-08 ENCOUNTER — Ambulatory Visit: Payer: PPO | Admitting: Physical Therapy

## 2022-12-08 ENCOUNTER — Encounter: Payer: Self-pay | Admitting: Physical Therapy

## 2022-12-08 DIAGNOSIS — M6281 Muscle weakness (generalized): Secondary | ICD-10-CM

## 2022-12-08 DIAGNOSIS — M5416 Radiculopathy, lumbar region: Secondary | ICD-10-CM

## 2022-12-08 DIAGNOSIS — M5459 Other low back pain: Secondary | ICD-10-CM

## 2022-12-08 DIAGNOSIS — M62838 Other muscle spasm: Secondary | ICD-10-CM

## 2022-12-08 DIAGNOSIS — R262 Difficulty in walking, not elsewhere classified: Secondary | ICD-10-CM

## 2022-12-09 ENCOUNTER — Encounter (INDEPENDENT_AMBULATORY_CARE_PROVIDER_SITE_OTHER): Payer: Self-pay

## 2022-12-09 ENCOUNTER — Other Ambulatory Visit: Payer: Self-pay | Admitting: Family

## 2022-12-13 ENCOUNTER — Ambulatory Visit: Payer: PPO

## 2022-12-13 DIAGNOSIS — R262 Difficulty in walking, not elsewhere classified: Secondary | ICD-10-CM

## 2022-12-13 DIAGNOSIS — M6281 Muscle weakness (generalized): Secondary | ICD-10-CM

## 2022-12-13 DIAGNOSIS — M5459 Other low back pain: Secondary | ICD-10-CM

## 2022-12-13 DIAGNOSIS — M5416 Radiculopathy, lumbar region: Secondary | ICD-10-CM | POA: Diagnosis not present

## 2022-12-13 DIAGNOSIS — M62838 Other muscle spasm: Secondary | ICD-10-CM

## 2022-12-13 NOTE — Therapy (Signed)
OUTPATIENT PHYSICAL THERAPY TREATMENT   Patient Name: Annalynn Kable MRN: [id number] DOB:1956/03/06, 67 y.o., female Today's Date: 12/13/2022  END OF SESSION:  PT End of Session - 12/13/22 1023     Visit Number 4    Date for PT Re-Evaluation 01/12/23    Progress Note Due on Visit 10    PT Start Time 1019    PT Stop Time 1101    PT Time Calculation (min) 42 min    Activity Tolerance Patient tolerated treatment well    Behavior During Therapy [facility] for tasks assessed/performed                Past Medical History:  Diagnosis Date   Anal stenosis    Asthma    09-23-2017  per pt has not done inhaler since May 2018, no insurance (QVAR bid and Ventolin prn)   B12 deficiency 01/05/2021   Crohn disease (HCC)    Diastolic dysfunction    per echo 06-23-2009  grade 2 diastolic dysfunction , ef 60-65%   Esophagitis    GERD (gastroesophageal reflux disease)    Hemorrhoids    Hiatal hernia    Hyperlipidemia    stopped meds due to side effects   Hypertension    09-23-2017  per pt has takes bp meds since May 2018 no insurance (losartan 100mg  qd, HCTZ 12.5mg  qd, and toprol 50 qd)   Microalbuminuria 06/01/2006   Qualifier: Diagnosis of  By: Allena Katz MD, Sejal     Type 2 diabetes mellitus (HCC)    09-23-2017 per pt has not taken diabetic meds since May 2018, no insurance (kombiglyze 5-1000mg  qd)   Past Surgical History:  Procedure Laterality Date   BREAST BIOPSY Bilateral    CARDIOVASCULAR STRESS TEST  07/29/2009   normal nuclear study w/ no ischemia/  normal LV function and wall motion,  ef 70%   CESAREAN SECTION  1980s   RECTAL BIOPSY N/A 10/03/2017   Procedure: POSSIBLE BIOPSY RECTAL;  Surgeon: Andria Meuse, MD;  Location: [facility];  Service: General;  Laterality: N/A;   Patient Active Problem List   Diagnosis Date Noted   Abnormal SPEP 11/06/2022   Fatigue 10/23/2022   Lower extremity edema 10/23/2022   Elevated serum protein level 10/23/2022   Uncontrolled type  2 diabetes mellitus with hyperglycemia (HCC) 07/06/2022   Irritable bowel syndrome with constipation 03/10/2022   Lumbar radiculopathy 10/28/2021   Chronic kidney disease, stage 3b (HCC) 07/09/2021   Controlled type 2 diabetes mellitus without complication, without long-term current use of insulin (HCC) 07/09/2021   Hip pain 07/09/2021   B12 deficiency 01/05/2021   Tinea pedis of both feet 10/03/2020   Healthcare maintenance 05/19/2018   Crohn disease (HCC) 03/09/2018   Diastolic dysfunction 03/09/2018   GERD (gastroesophageal reflux disease) 03/09/2018   Migraine without aura 07/17/2008   Microalbuminuria 06/01/2006   Hyperlipidemia 05/30/2006   Essential hypertension 05/30/2006   Asthma 05/30/2006    PCP: Sandford Craze, NP   REFERRING PROVIDER: Sandford Craze, NP   REFERRING DIAG: M54.16 (ICD-10-CM) - Lumbar radiculopathy   THERAPY DIAG:  Radiculopathy, lumbar region  Other low back pain  Muscle weakness (generalized)  Other muscle spasm  Difficulty in walking, not elsewhere classified  RATIONALE FOR EVALUATION AND TREATMENT: Rehabilitation  ONSET DATE: chronic "on and off"  NEXT MD VISIT: 01/24/23   SUBJECTIVE:  SUBJECTIVE STATEMENT:  Pt reports she is stiff today with a little pain.  EVAL:  Pt reports the complaint that led to her PT referral was leg cramps in her B inner thighs - pain so intense that it was off the scale and almost caused her to faint. Low back pain has been an issue "on and off" for 2-3 years. Radicular pain varies in pattern and intensity, but does not note any specific numbness or tingling. She states her PCP wants her to be more active but she does not feel that she has the stamina to walk due to weakness - unable to walk for more than  10-15 minutes.  PAIN: Are you having pain? Yes: NPRS scale: 2/10 currently, at worst 7-8/10 Pain location: B low back  Pain description: constant ache Aggravating factors: movement - bending, lifting Relieving factors: heat  PERTINENT HISTORY:  HTN, DM-II, Asthma, Crohn's disease, GERD, IBS, CKD, fatigue, hip pain  PRECAUTIONS: None  RED FLAGS: None  WEIGHT BEARING RESTRICTIONS: No  FALLS:  Has patient fallen in last 6 months? No  LIVING ENVIRONMENT: Lives with: lives alone Lives in: House/apartment Stairs: No Has following equipment at home: Single point cane  OCCUPATION: Retired  PLOF: Independent and Leisure: mostly sedentary other than ADLs  PATIENT GOALS: "I want to be able to increase my activity, esp do more walking."   OBJECTIVE: (objective measures completed at initial evaluation unless otherwise dated)  DIAGNOSTIC FINDINGS:  DG Lumbar spine ordered  PATIENT SURVEYS:  Modified Oswestry 14 / 50 = 28.0 %  FOTO Lumbar - 44; predicted - 55  SCREENING FOR RED FLAGS: Bowel or bladder incontinence: No Spinal tumors: No Cauda equina syndrome: No Compression fracture: No Abdominal aneurysm: No  COGNITION:  Overall cognitive status: Within functional limits for tasks assessed    SENSATION: WFL - pt denies radicular numbness or tingling  MUSCLE LENGTH: Hamstrings: Mild tight B ITB: Mild tight B Piriformis: Mod/severe tight bil Hip flexors: Mild tight B Quads: Mild/mod tight B Heelcord: NT  POSTURE:  No Significant postural limitations  PALPATION: Increased muscle tension and TTP in bilateral lower lumbar paraspinals at lumbosacral junction, as well as R>L glutes and piriformis. Lumbosacral hypomobility also noted.  LUMBAR ROM:   Active  eval  Flexion Hands to just above ankles ^  Extension 30% limited ^  Right lateral flexion Hand to mid shin ^  Left lateral flexion Hand to fib head ^  Right rotation 25% limited ^  Left rotation 25%  limited ^  (Blank rows = not tested, ^ = increased pain)  LOWER EXTREMITY ROM:    B LE essentially WNL except for limitations in B hip rotation  LOWER EXTREMITY MMT:    MMT Right eval Left eval  Hip flexion 4- 4-  Hip extension 3 3-  Hip abduction 4- 3+  Hip adduction 3- 3-  Hip internal rotation 4 4  Hip external rotation 4 4  Knee flexion 4+ 4+  Knee extension 4+ 4+  Ankle dorsiflexion 4+ 4+  Ankle plantarflexion    Ankle inversion    Ankle eversion     (Blank rows = not tested)  LUMBAR SPECIAL TESTS:  Straight leg raise test: Negative and Slump test: Negative  FUNCTIONAL TESTS:  Timed up and go (TUG): 15.27 sec 10 meter walk test: 13.15 sec Gait speed: 2.49 ft/sec  GAIT: Distance walked: 80 feet Assistive device utilized: None Level of assistance: Complete Independence Gait pattern: step to pattern, decreased stride length, decreased  hip/knee flexion- Right, decreased hip/knee flexion- Left, and wide BOS Comments: Decreased gait speed   TODAY'S TREATMENT:  12/13/22 THERAPEUTIC EXERCISE: to improve flexibility, strength and mobility.  Demonstration, verbal and tactile cues throughout for technique.  NuStep - L5 x 6 min (UE/LE)  Reviewed standing lumbar extension at wall and back to counter Supine pelvic tilt 20x hooklying bent knee fallout TrA RTB 10x5" Hooklying march RTB TrA 10x3" Hooklying hip ADD with ball and TrA set 10x3"  12/08/22 THERAPEUTIC EXERCISE: to improve flexibility, strength and mobility.  Demonstration, verbal and tactile cues throughout for technique.  NuStep - L5 x 6 min (UE/LE)  Hooklying LTR 10 x 5" Hooklying pelvic tilt 10 x 3-5" - cues for pacing and self-awareness of TrA activation Hooklying SKTC stretch with folded pillowcase for extended reach 10 x 5" Hooklying alt bent-knee fallouts 10 x 3" cues to avoid trunk rotation Hooklying TrA/PPT + hip ADD ball squeeze isometric 8 x 5" - discontinued due to increased LBP Hookying TrA march  10 x 3" Seated hip ABD/ER with foot elevated on 9" stool for piriformis/glutes stretch 3 x 30" (unable to tolerate full figure-4 stretch position in sitting or supine)   11/30/22:  Therapeutic exercise:  Instructed in the following ex to assist the patient in improving her lumbar mobility, flexibility, and hip mobility and thereby improve her tolerance to activity/ pain.  Education throughout on rationale , purpose of each ex.: Nustep, Ue's and LE's, level 5, 6 min Supine for LTR Supine pelvic tilt, patient needed cues to avoid holding breath, and to engage transverse abdominus correctly Supine alt knee to chest from hooklying position Supine hooklying hip fall outs  All ex 10 to 15 reps  Completed TUG and 10 M walk test as indicated above   11/17/22 Eval only   PATIENT EDUCATION:  Education details: HEP review and HEP update Person educated: Patient Education method: Programmer, multimedia, Demonstration, Verbal cues, and Handouts Education comprehension: verbalized understanding, returned demonstration, verbal cues required, and needs further education  HOME EXERCISE PROGRAM: Access Code: [email or web address] URL: [email or web address] Date: 12/08/2022 Prepared by: Glenetta Hew  Exercises - Supine Lower Trunk Rotation  - 1 x daily - 7 x weekly - 3 sets - 10 reps - Supine Pelvic Tilt  - 1 x daily - 7 x weekly - 3 sets - 10 reps - Hooklying Single Knee to Chest  - 1 x daily - 7 x weekly - 3 sets - 10 reps - Bent Knee Fallouts  - 1 x daily - 7 x weekly - 3 sets - 10 reps - Supine March with Posterior Pelvic Tilt  - 1 x daily - 7 x weekly - 3 sets - 10 reps - 3 sec hold - Seated Piriformis Stretch  - 1-2 x daily - 7 x weekly - 3 reps - 30 sec hold - Standing Lumbar Extension at Wall - Forearms  - 1 x daily - 7 x weekly - 2 sets - 10 reps - 3 sec hold - Standing Lumbar Extension with Counter  - 1 x daily - 7 x weekly - 2 sets - 10 reps - 3 sec hold  ASSESSMENT:  CLINICAL IMPRESSION: Pt  overall responded well as we continued to work on lumbo-pelvic mobility and initiating gentle strengthening. Showed a good demo of pelvic tilts today only minor cues to avoid lifting head and feet. She had questions about the lumbar extension, which we clarified today. Progressed bent knee fallouts and marching with RTB and added to  HEP.  Samyria will benefit from skilled PT to address above deficits to improve mobility and activity tolerance with decreased pain interference.   OBJECTIVE IMPAIRMENTS: Abnormal gait, decreased activity tolerance, decreased balance, decreased endurance, decreased knowledge of condition, decreased mobility, difficulty walking, decreased ROM, decreased strength, increased fascial restrictions, impaired perceived functional ability, increased muscle spasms, impaired flexibility, improper body mechanics, postural dysfunction, and pain.   ACTIVITY LIMITATIONS: carrying, lifting, bending, standing, stairs, and locomotion level  PARTICIPATION LIMITATIONS: meal prep, cleaning, laundry, driving, and community activity  PERSONAL FACTORS: Age, Fitness, Past/current experiences, Time since onset of injury/illness/exacerbation, and 3+ comorbidities: HTN, DM-II, Asthma, Crohn's disease, GERD, IBS, CKD, fatigue, hip pain  are also affecting patient's functional outcome.   REHAB POTENTIAL: Good  CLINICAL DECISION MAKING: Evolving/moderate complexity  EVALUATION COMPLEXITY: Moderate   GOALS: Goals reviewed with patient? Yes  SHORT TERM GOALS: Target date: 12/15/2022   Patient will be independent with initial HEP to improve outcomes and carryover.  Baseline: TBD Goal status: IN PROGRESS  2.  Patient will report centralization of radicular symptoms.  Baseline: Intermittent B LE radicular pain down to ankles and feet with variable patterns Goal status: IN PROGRESS  LONG TERM GOALS: Target date: 01/12/2023  Patient will be independent with ongoing/advanced HEP for  self-management at home.  Baseline:  Goal status: IN PROGRESS  2.  Patient will report 50-75% improvement in low back pain to improve QOL.  Baseline: 2-3/10 on eval, up to 8/10 at worst; B inner thigh pain/cramping "off the charts" when present Goal status: IN PROGRESS  3.  Patient to demonstrate ability to achieve and maintain good spinal alignment/posturing and body mechanics needed for daily activities. Baseline:  Goal status: IN PROGRESS  4.  Patient will demonstrate functional pain free lumbar ROM to perform ADLs.   Baseline: Limited and painful lumbar ROM in all planes - refer to above lumbar ROM table Goal status: IN PROGRESS  5.  Patient will demonstrate improved B LE strength to >/= 4 to 4+/5 for improved stability and ease of mobility . Baseline: Refer to above LE MMT table Goal status: IN PROGRESS  6.  Patient will report >/= 55 on lumbar FOTO to demonstrate improved functional ability.  Baseline: 44 Goal status: IN PROGRESS   7. Patient will report </= 16% on Modified Oswestry to demonstrate improved functional ability with decreased pain interference. Baseline: 14 / 50 = 28.0 % Goal status: IN PROGRESS  8.  Patient will tolerate 20-30 min of walking w/o increased pain to allow for improved mobility and activity tolerance so that she can resume walking for exercise. Baseline: Walking limited to <10-15 minutes Goal status: IN PROGRESS   PLAN:  PT FREQUENCY: 2x/week  PT DURATION: 8 weeks  PLANNED INTERVENTIONS: Therapeutic exercises, Therapeutic activity, Neuromuscular re-education, Balance training, Gait training, Patient/Family education, Self Care, Joint mobilization, Stair training, Aquatic Therapy, Dry Needling, Electrical stimulation, Spinal manipulation, Spinal mobilization, Cryotherapy, Moist heat, Taping, Traction, Ultrasound, Manual therapy, and Re-evaluation  PLAN FOR NEXT SESSION: MT to address abnormal muscle tension in lumbar paraspinals and R>L  glutes/piriformis, possibly hip mobs; core/lumbopelvic flexibility and strengthening with extension-based preference.   Darleene Cleaver, PTA 12/13/2022, 11:03 AM

## 2022-12-14 ENCOUNTER — Other Ambulatory Visit: Payer: Self-pay | Admitting: Family

## 2022-12-15 ENCOUNTER — Ambulatory Visit: Payer: PPO

## 2022-12-15 DIAGNOSIS — M62838 Other muscle spasm: Secondary | ICD-10-CM

## 2022-12-15 DIAGNOSIS — M6281 Muscle weakness (generalized): Secondary | ICD-10-CM

## 2022-12-15 DIAGNOSIS — M5416 Radiculopathy, lumbar region: Secondary | ICD-10-CM | POA: Diagnosis not present

## 2022-12-15 DIAGNOSIS — R262 Difficulty in walking, not elsewhere classified: Secondary | ICD-10-CM

## 2022-12-15 DIAGNOSIS — M5459 Other low back pain: Secondary | ICD-10-CM

## 2022-12-15 NOTE — Therapy (Signed)
OUTPATIENT PHYSICAL THERAPY TREATMENT   Patient Name: Kathy [name] MRN: [id number] DOB:Dec 09, 1955, 67 y.o., female Today's Date: 12/15/2022  END OF SESSION:  PT End of Session - 12/15/22 1026     Visit Number 5    Date for PT Re-Evaluation 01/12/23    Progress Note Due on Visit 10    PT Start Time 1019    PT Stop Time 1108    PT Time Calculation (min) 49 min    Activity Tolerance Patient tolerated treatment well    Behavior During Therapy [facility] for tasks assessed/performed                 Past Medical History:  Diagnosis Date   Anal stenosis    Asthma    09-23-2017  per pt has not done inhaler since May 2018, no insurance (QVAR bid and Ventolin prn)   B12 deficiency 01/05/2021   Crohn disease (HCC)    Diastolic dysfunction    per echo 06-23-2009  grade 2 diastolic dysfunction , ef 60-65%   Esophagitis    GERD (gastroesophageal reflux disease)    Hemorrhoids    Hiatal hernia    Hyperlipidemia    stopped meds due to side effects   Hypertension    09-23-2017  per pt has takes bp meds since May 2018 no insurance (losartan 100mg  qd, HCTZ 12.5mg  qd, and toprol 50 qd)   Microalbuminuria 06/01/2006   Qualifier: Diagnosis of  By: Allena Katz MD, Sejal     Type 2 diabetes mellitus (HCC)    09-23-2017 per pt has not taken diabetic meds since May 2018, no insurance (kombiglyze 5-1000mg  qd)   Past Surgical History:  Procedure Laterality Date   BREAST BIOPSY Bilateral    CARDIOVASCULAR STRESS TEST  07/29/2009   normal nuclear study w/ no ischemia/  normal LV function and wall motion,  ef 70%   CESAREAN SECTION  1980s   RECTAL BIOPSY N/A 10/03/2017   Procedure: POSSIBLE BIOPSY RECTAL;  Surgeon: Andria Meuse, MD;  Location: [facility];  Service: General;  Laterality: N/A;   Patient Active Problem List   Diagnosis Date Noted   Abnormal SPEP 11/06/2022   Fatigue 10/23/2022   Lower extremity edema 10/23/2022   Elevated serum protein level 10/23/2022   Uncontrolled  type 2 diabetes mellitus with hyperglycemia (HCC) 07/06/2022   Irritable bowel syndrome with constipation 03/10/2022   Lumbar radiculopathy 10/28/2021   Chronic kidney disease, stage 3b (HCC) 07/09/2021   Controlled type 2 diabetes mellitus without complication, without long-term current use of insulin (HCC) 07/09/2021   Hip pain 07/09/2021   B12 deficiency 01/05/2021   Tinea pedis of both feet 10/03/2020   Healthcare maintenance 05/19/2018   Crohn disease (HCC) 03/09/2018   Diastolic dysfunction 03/09/2018   GERD (gastroesophageal reflux disease) 03/09/2018   Migraine without aura 07/17/2008   Microalbuminuria 06/01/2006   Hyperlipidemia 05/30/2006   Essential hypertension 05/30/2006   Asthma 05/30/2006    PCP: Sandford Craze, NP   REFERRING PROVIDER: Sandford Craze, NP   REFERRING DIAG: M54.16 (ICD-10-CM) - Lumbar radiculopathy   THERAPY DIAG:  Radiculopathy, lumbar region  Other low back pain  Muscle weakness (generalized)  Other muscle spasm  Difficulty in walking, not elsewhere classified  RATIONALE FOR EVALUATION AND TREATMENT: Rehabilitation  ONSET DATE: chronic "on and off"  NEXT MD VISIT: 01/24/23   SUBJECTIVE:  SUBJECTIVE STATEMENT:  Pt is having DOMS today from progressed exercises.  EVAL:  Pt reports the complaint that led to her PT referral was leg cramps in her B inner thighs - pain so intense that it was off the scale and almost caused her to faint. Low back pain has been an issue "on and off" for 2-3 years. Radicular pain varies in pattern and intensity, but does not note any specific numbness or tingling. She states her PCP wants her to be more active but she does not feel that she has the stamina to walk due to weakness - unable to walk for more  than 10-15 minutes.  PAIN: Are you having pain? Yes: NPRS scale: 4/10 currently, at worst 7-8/10 Pain location: B low back  Pain description: constant ache Aggravating factors: movement - bending, lifting Relieving factors: heat  PERTINENT HISTORY:  HTN, DM-II, Asthma, Crohn's disease, GERD, IBS, CKD, fatigue, hip pain  PRECAUTIONS: None  RED FLAGS: None  WEIGHT BEARING RESTRICTIONS: No  FALLS:  Has patient fallen in last 6 months? No  LIVING ENVIRONMENT: Lives with: lives alone Lives in: House/apartment Stairs: No Has following equipment at home: Single point cane  OCCUPATION: Retired  PLOF: Independent and Leisure: mostly sedentary other than ADLs  PATIENT GOALS: "I want to be able to increase my activity, esp do more walking."   OBJECTIVE: (objective measures completed at initial evaluation unless otherwise dated)  DIAGNOSTIC FINDINGS:  DG Lumbar spine ordered  PATIENT SURVEYS:  Modified Oswestry 14 / 50 = 28.0 %  FOTO Lumbar - 44; predicted - 55  SCREENING FOR RED FLAGS: Bowel or bladder incontinence: No Spinal tumors: No Cauda equina syndrome: No Compression fracture: No Abdominal aneurysm: No  COGNITION:  Overall cognitive status: Within functional limits for tasks assessed    SENSATION: WFL - pt denies radicular numbness or tingling  MUSCLE LENGTH: Hamstrings: Mild tight B ITB: Mild tight B Piriformis: Mod/severe tight bil Hip flexors: Mild tight B Quads: Mild/mod tight B Heelcord: NT  POSTURE:  No Significant postural limitations  PALPATION: Increased muscle tension and TTP in bilateral lower lumbar paraspinals at lumbosacral junction, as well as R>L glutes and piriformis. Lumbosacral hypomobility also noted.  LUMBAR ROM:   Active  eval  Flexion Hands to just above ankles ^  Extension 30% limited ^  Right lateral flexion Hand to mid shin ^  Left lateral flexion Hand to fib head ^  Right rotation 25% limited ^  Left rotation 25%  limited ^  (Blank rows = not tested, ^ = increased pain)  LOWER EXTREMITY ROM:    B LE essentially WNL except for limitations in B hip rotation  LOWER EXTREMITY MMT:    MMT Right eval Left eval  Hip flexion 4- 4-  Hip extension 3 3-  Hip abduction 4- 3+  Hip adduction 3- 3-  Hip internal rotation 4 4  Hip external rotation 4 4  Knee flexion 4+ 4+  Knee extension 4+ 4+  Ankle dorsiflexion 4+ 4+  Ankle plantarflexion    Ankle inversion    Ankle eversion     (Blank rows = not tested)  LUMBAR SPECIAL TESTS:  Straight leg raise test: Negative and Slump test: Negative  FUNCTIONAL TESTS:  Timed up and go (TUG): 15.27 sec 10 meter walk test: 13.15 sec Gait speed: 2.49 ft/sec  GAIT: Distance walked: 80 feet Assistive device utilized: None Level of assistance: Complete Independence Gait pattern: step to pattern, decreased stride length, decreased hip/knee flexion-  Right, decreased hip/knee flexion- Left, and wide BOS Comments: Decreased gait speed   TODAY'S TREATMENT:  12/15/22 THERAPEUTIC EXERCISE: to improve flexibility, strength and mobility.  Demonstration, verbal and tactile cues throughout for technique.  Bike L1 x 4 min LTR x 10 bil Seated hip hinge with orange pball x 5 Seated ab set with orange pball x 10  Seated hip Add stretch 3x10" bil Standing lumbar extension at wall 10x3" Runner stretch 3x10" bil Lateral hip drop at wall x 10 bil  Manual Therapy: to decrease muscle spasm, pain and improve mobility.  Passive stretching of bil hip flexors with towel under thigh Passive bil hip ER/IR all in prone  12/13/22 THERAPEUTIC EXERCISE: to improve flexibility, strength and mobility.  Demonstration, verbal and tactile cues throughout for technique.  NuStep - L5 x 6 min (UE/LE)  Reviewed standing lumbar extension at wall and back to counter Supine pelvic tilt 20x hooklying bent knee fallout TrA RTB 10x5" Hooklying march RTB TrA 10x3" Hooklying hip ADD with ball  and TrA set 10x3"  12/08/22 THERAPEUTIC EXERCISE: to improve flexibility, strength and mobility.  Demonstration, verbal and tactile cues throughout for technique.  NuStep - L5 x 6 min (UE/LE)  Hooklying LTR 10 x 5" Hooklying pelvic tilt 10 x 3-5" - cues for pacing and self-awareness of TrA activation Hooklying SKTC stretch with folded pillowcase for extended reach 10 x 5" Hooklying alt bent-knee fallouts 10 x 3" cues to avoid trunk rotation Hooklying TrA/PPT + hip ADD ball squeeze isometric 8 x 5" - discontinued due to increased LBP Hookying TrA march 10 x 3" Seated hip ABD/ER with foot elevated on 9" stool for piriformis/glutes stretch 3 x 30" (unable to tolerate full figure-4 stretch position in sitting or supine)   11/30/22:  Therapeutic exercise:  Instructed in the following ex to assist the patient in improving her lumbar mobility, flexibility, and hip mobility and thereby improve her tolerance to activity/ pain.  Education throughout on rationale , purpose of each ex.: Nustep, Ue's and LE's, level 5, 6 min Supine for LTR Supine pelvic tilt, patient needed cues to avoid holding breath, and to engage transverse abdominus correctly Supine alt knee to chest from hooklying position Supine hooklying hip fall outs  All ex 10 to 15 reps  Completed TUG and 10 M walk test as indicated above   11/17/22 Eval only   PATIENT EDUCATION:  Education details: HEP review and HEP update Person educated: Patient Education method: Programmer, multimedia, Demonstration, Verbal cues, and Handouts Education comprehension: verbalized understanding, returned demonstration, verbal cues required, and needs further education  HOME EXERCISE PROGRAM: Access Code: [email or web address] URL: [email or web address] Date: 12/08/2022 Prepared by: Glenetta Hew  Exercises - Supine Lower Trunk Rotation  - 1 x daily - 7 x weekly - 3 sets - 10 reps - Supine Pelvic Tilt  - 1 x daily - 7 x weekly - 3 sets - 10 reps -  Hooklying Single Knee to Chest  - 1 x daily - 7 x weekly - 3 sets - 10 reps - Bent Knee Fallouts  - 1 x daily - 7 x weekly - 3 sets - 10 reps - Supine March with Posterior Pelvic Tilt  - 1 x daily - 7 x weekly - 3 sets - 10 reps - 3 sec hold - Seated Piriformis Stretch  - 1-2 x daily - 7 x weekly - 3 reps - 30 sec hold - Standing Lumbar Extension at Wall - Forearms  - 1 x daily - 7  x weekly - 2 sets - 10 reps - 3 sec hold - Standing Lumbar Extension with Counter  - 1 x daily - 7 x weekly - 2 sets - 10 reps - 3 sec hold  ASSESSMENT:  CLINICAL IMPRESSION: Kathy [name] came in today with increased soreness in her low back and anterior medial hip, most likely from progressed exercises last visit. Went through gentle exercises for mobility and flexibility. At first she was having issues lying supine but better tolerated seated and standing stretching. Her IR on the R hip is limited compared to ER. We also talked about doing resistance band exercises only 3x per week to allow time for muscle recovery. Kathy [name] will benefit from skilled PT to address above deficits to improve mobility and activity tolerance with decreased pain interference.   OBJECTIVE IMPAIRMENTS: Abnormal gait, decreased activity tolerance, decreased balance, decreased endurance, decreased knowledge of condition, decreased mobility, difficulty walking, decreased ROM, decreased strength, increased fascial restrictions, impaired perceived functional ability, increased muscle spasms, impaired flexibility, improper body mechanics, postural dysfunction, and pain.   ACTIVITY LIMITATIONS: carrying, lifting, bending, standing, stairs, and locomotion level  PARTICIPATION LIMITATIONS: meal prep, cleaning, laundry, driving, and community activity  PERSONAL FACTORS: Age, Fitness, Past/current experiences, Time since onset of injury/illness/exacerbation, and 3+ comorbidities: HTN, DM-II, Asthma, Crohn's disease, GERD, IBS, CKD, fatigue, hip pain  are also  affecting patient's functional outcome.   REHAB POTENTIAL: Good  CLINICAL DECISION MAKING: Evolving/moderate complexity  EVALUATION COMPLEXITY: Moderate   GOALS: Goals reviewed with patient? Yes  SHORT TERM GOALS: Target date: 12/15/2022   Patient will be independent with initial HEP to improve outcomes and carryover.  Baseline: TBD Goal status: MET- 12/15/22  2.  Patient will report centralization of radicular symptoms.  Baseline: Intermittent B LE radicular pain down to ankles and feet with variable patterns Goal status: IN PROGRESS- no incidents of radicular pain 12/15/22  LONG TERM GOALS: Target date: 01/12/2023  Patient will be independent with ongoing/advanced HEP for self-management at home.  Baseline:  Goal status: IN PROGRESS  2.  Patient will report 50-75% improvement in low back pain to improve QOL.  Baseline: 2-3/10 on eval, up to 8/10 at worst; B inner thigh pain/cramping "off the charts" when present Goal status: IN PROGRESS  3.  Patient to demonstrate ability to achieve and maintain good spinal alignment/posturing and body mechanics needed for daily activities. Baseline:  Goal status: IN PROGRESS  4.  Patient will demonstrate functional pain free lumbar ROM to perform ADLs.   Baseline: Limited and painful lumbar ROM in all planes - refer to above lumbar ROM table Goal status: IN PROGRESS  5.  Patient will demonstrate improved B LE strength to >/= 4 to 4+/5 for improved stability and ease of mobility . Baseline: Refer to above LE MMT table Goal status: IN PROGRESS  6.  Patient will report >/= 55 on lumbar FOTO to demonstrate improved functional ability.  Baseline: 44 Goal status: IN PROGRESS   7. Patient will report </= 16% on Modified Oswestry to demonstrate improved functional ability with decreased pain interference. Baseline: 14 / 50 = 28.0 % Goal status: IN PROGRESS  8.  Patient will tolerate 20-30 min of walking w/o increased pain to allow for  improved mobility and activity tolerance so that she can resume walking for exercise. Baseline: Walking limited to <10-15 minutes Goal status: IN PROGRESS   PLAN:  PT FREQUENCY: 2x/week  PT DURATION: 8 weeks  PLANNED INTERVENTIONS: Therapeutic exercises, Therapeutic activity, Neuromuscular re-education, Balance  training, Gait training, Patient/Family education, Self Care, Joint mobilization, Stair training, Aquatic Therapy, Dry Needling, Electrical stimulation, Spinal manipulation, Spinal mobilization, Cryotherapy, Moist heat, Taping, Traction, Ultrasound, Manual therapy, and Re-evaluation  PLAN FOR NEXT SESSION: MT to address abnormal muscle tension in lumbar paraspinals and R>L glutes/piriformis, possibly hip mobs; core/lumbopelvic flexibility and strengthening with extension-based preference.   Darleene Cleaver, PTA 12/15/2022, 11:41 AM

## 2022-12-20 ENCOUNTER — Encounter: Payer: Self-pay | Admitting: Physical Therapy

## 2022-12-20 ENCOUNTER — Ambulatory Visit: Payer: PPO | Admitting: Physical Therapy

## 2022-12-20 DIAGNOSIS — M62838 Other muscle spasm: Secondary | ICD-10-CM

## 2022-12-20 DIAGNOSIS — M5416 Radiculopathy, lumbar region: Secondary | ICD-10-CM | POA: Diagnosis not present

## 2022-12-20 DIAGNOSIS — M5459 Other low back pain: Secondary | ICD-10-CM

## 2022-12-20 DIAGNOSIS — R262 Difficulty in walking, not elsewhere classified: Secondary | ICD-10-CM

## 2022-12-20 DIAGNOSIS — M6281 Muscle weakness (generalized): Secondary | ICD-10-CM

## 2022-12-20 NOTE — Therapy (Signed)
OUTPATIENT PHYSICAL THERAPY TREATMENT   Patient Name: Kathy [name] MRN: [id number] DOB:02/14/1956, 67 y.o., female Today's Date: 12/20/2022  END OF SESSION:  PT End of Session - 12/20/22 1016     Visit Number 6    Date for PT Re-Evaluation 01/12/23    Progress Note Due on Visit 10    PT Start Time 1016    PT Stop Time 1058    PT Time Calculation (min) 42 min    Activity Tolerance Patient tolerated treatment well    Behavior During Therapy [facility] for tasks assessed/performed                 Past Medical History:  Diagnosis Date   Anal stenosis    Asthma    09-23-2017  per pt has not done inhaler since May 2018, no insurance (QVAR bid and Ventolin prn)   B12 deficiency 01/05/2021   Crohn disease (HCC)    Diastolic dysfunction    per echo 06-23-2009  grade 2 diastolic dysfunction , ef 60-65%   Esophagitis    GERD (gastroesophageal reflux disease)    Hemorrhoids    Hiatal hernia    Hyperlipidemia    stopped meds due to side effects   Hypertension    09-23-2017  per pt has takes bp meds since May 2018 no insurance (losartan 100mg  qd, HCTZ 12.5mg  qd, and toprol 50 qd)   Microalbuminuria 06/01/2006   Qualifier: Diagnosis of  By: Allena Katz MD, Sejal     Type 2 diabetes mellitus (HCC)    09-23-2017 per pt has not taken diabetic meds since May 2018, no insurance (kombiglyze 5-1000mg  qd)   Past Surgical History:  Procedure Laterality Date   BREAST BIOPSY Bilateral    CARDIOVASCULAR STRESS TEST  07/29/2009   normal nuclear study w/ no ischemia/  normal LV function and wall motion,  ef 70%   CESAREAN SECTION  1980s   RECTAL BIOPSY N/A 10/03/2017   Procedure: POSSIBLE BIOPSY RECTAL;  Surgeon: Andria Meuse, MD;  Location: [facility];  Service: General;  Laterality: N/A;   Patient Active Problem List   Diagnosis Date Noted   Abnormal SPEP 11/06/2022   Fatigue 10/23/2022   Lower extremity edema 10/23/2022   Elevated serum protein level 10/23/2022   Uncontrolled  type 2 diabetes mellitus with hyperglycemia (HCC) 07/06/2022   Irritable bowel syndrome with constipation 03/10/2022   Lumbar radiculopathy 10/28/2021   Chronic kidney disease, stage 3b (HCC) 07/09/2021   Controlled type 2 diabetes mellitus without complication, without long-term current use of insulin (HCC) 07/09/2021   Hip pain 07/09/2021   B12 deficiency 01/05/2021   Tinea pedis of both feet 10/03/2020   Healthcare maintenance 05/19/2018   Crohn disease (HCC) 03/09/2018   Diastolic dysfunction 03/09/2018   GERD (gastroesophageal reflux disease) 03/09/2018   Migraine without aura 07/17/2008   Microalbuminuria 06/01/2006   Hyperlipidemia 05/30/2006   Essential hypertension 05/30/2006   Asthma 05/30/2006    PCP: Sandford Craze, NP   REFERRING PROVIDER: Sandford Craze, NP   REFERRING DIAG: M54.16 (ICD-10-CM) - Lumbar radiculopathy   THERAPY DIAG:  Radiculopathy, lumbar region  Other low back pain  Muscle weakness (generalized)  Other muscle spasm  Difficulty in walking, not elsewhere classified  RATIONALE FOR EVALUATION AND TREATMENT: Rehabilitation  ONSET DATE: chronic "on and off"  NEXT MD VISIT: 01/24/23   SUBJECTIVE:  SUBJECTIVE STATEMENT:  Pt reports her pain is as low as it has been today.  EVAL:  Pt reports the complaint that led to her PT referral was leg cramps in her B inner thighs - pain so intense that it was off the scale and almost caused her to faint. Low back pain has been an issue "on and off" for 2-3 years. Radicular pain varies in pattern and intensity, but does not note any specific numbness or tingling. She states her PCP wants her to be more active but she does not feel that she has the stamina to walk due to weakness - unable to walk for more  than 10-15 minutes.  PAIN: Are you having pain? Yes: NPRS scale:  2/10 Pain location: B low back  Pain description: intermittent ache Aggravating factors: movement - bending, lifting Relieving factors: heat  PERTINENT HISTORY:  HTN, DM-II, Asthma, Crohn's disease, GERD, IBS, CKD, fatigue, hip pain  PRECAUTIONS: None  RED FLAGS: None  WEIGHT BEARING RESTRICTIONS: No  FALLS:  Has patient fallen in last 6 months? No  LIVING ENVIRONMENT: Lives with: lives alone Lives in: House/apartment Stairs: No Has following equipment at home: Single point cane  OCCUPATION: Retired  PLOF: Independent and Leisure: mostly sedentary other than ADLs  PATIENT GOALS: "I want to be able to increase my activity, esp do more walking."   OBJECTIVE: (objective measures completed at initial evaluation unless otherwise dated)  DIAGNOSTIC FINDINGS:  DG Lumbar spine ordered  PATIENT SURVEYS:  Modified Oswestry 14 / 50 = 28.0 %  FOTO Lumbar - 44; predicted - 55  SCREENING FOR RED FLAGS: Bowel or bladder incontinence: No Spinal tumors: No Cauda equina syndrome: No Compression fracture: No Abdominal aneurysm: No  COGNITION:  Overall cognitive status: Within functional limits for tasks assessed    SENSATION: WFL - pt denies radicular numbness or tingling  MUSCLE LENGTH: Hamstrings: Mild tight B ITB: Mild tight B Piriformis: Mod/severe tight bil Hip flexors: Mild tight B Quads: Mild/mod tight B Heelcord: NT  POSTURE:  No Significant postural limitations  PALPATION: Increased muscle tension and TTP in bilateral lower lumbar paraspinals at lumbosacral junction, as well as R>L glutes and piriformis. Lumbosacral hypomobility also noted.  LUMBAR ROM:   Active  eval  Flexion Hands to just above ankles ^  Extension 30% limited ^  Right lateral flexion Hand to mid shin ^  Left lateral flexion Hand to fib head ^  Right rotation 25% limited ^  Left rotation 25% limited ^  (Blank rows  = not tested, ^ = increased pain)  LOWER EXTREMITY ROM:    B LE essentially WNL except for limitations in B hip rotation  LOWER EXTREMITY MMT:    MMT Right eval Left eval  Hip flexion 4- 4-  Hip extension 3 3-  Hip abduction 4- 3+  Hip adduction 3- 3-  Hip internal rotation 4 4  Hip external rotation 4 4  Knee flexion 4+ 4+  Knee extension 4+ 4+  Ankle dorsiflexion 4+ 4+  Ankle plantarflexion    Ankle inversion    Ankle eversion     (Blank rows = not tested)  LUMBAR SPECIAL TESTS:  Straight leg raise test: Negative and Slump test: Negative  FUNCTIONAL TESTS:  Timed up and go (TUG): 15.27 sec 10 meter walk test: 13.15 sec Gait speed: 2.49 ft/sec  GAIT: Distance walked: 80 feet Assistive device utilized: None Level of assistance: Complete Independence Gait pattern: step to pattern, decreased stride length, decreased hip/knee  flexion- Right, decreased hip/knee flexion- Left, and wide BOS Comments: Decreased gait speed   TODAY'S TREATMENT:   12/20/22 THERAPEUTIC EXERCISE: to improve flexibility, strength and mobility.  Demonstration, verbal and tactile cues throughout for technique.  NuStep - L5 x 6 min (UE/LE)  Seated TrA + RTB alt hip ABD/ER clam 10 x 3", 2 sets Seated TrA + RTB march 10 x 3" Seated TrA + hip ADD ball squeeze isometric 10 x 3" STS keeping back straight x 10 Functional squat + B UE raise 10 x 3"   12/15/22 THERAPEUTIC EXERCISE: to improve flexibility, strength and mobility.  Demonstration, verbal and tactile cues throughout for technique.  Bike L1 x 4 min LTR x 10 bil Seated hip hinge with orange pball x 5 Seated ab set with orange pball x 10  Seated hip Add stretch 3x10" bil Standing lumbar extension at wall 10x3" Runner stretch 3x10" bil Lateral hip drop at wall x 10 bil  Manual Therapy: to decrease muscle spasm, pain and improve mobility.  Passive stretching of bil hip flexors with towel under thigh Passive bil hip ER/IR all in  prone   12/13/22 THERAPEUTIC EXERCISE: to improve flexibility, strength and mobility.  Demonstration, verbal and tactile cues throughout for technique.  NuStep - L5 x 6 min (UE/LE)  Reviewed standing lumbar extension at wall and back to counter Supine pelvic tilt 20x hooklying bent knee fallout TrA RTB 10x5" Hooklying march RTB TrA 10x3" Hooklying hip ADD with ball and TrA set 10x3"   PATIENT EDUCATION:  Education details: HEP review and HEP update Person educated: Patient Education method: Explanation, Demonstration, Verbal cues, and Handouts Education comprehension: verbalized understanding, returned demonstration, verbal cues required, and needs further education  HOME EXERCISE PROGRAM: Access Code: [email or web address] URL: [email or web address] Date: 12/20/2022 Prepared by: Glenetta Hew  Exercises - Supine Lower Trunk Rotation  - 1 x daily - 7 x weekly - 3 sets - 10 reps - Supine Pelvic Tilt  - 1 x daily - 7 x weekly - 3 sets - 10 reps - Hooklying Single Knee to Chest  - 1 x daily - 7 x weekly - 3 sets - 10 reps - Seated Piriformis Stretch  - 1-2 x daily - 7 x weekly - 3 reps - 30 sec hold - Standing Lumbar Extension at Wall - Forearms  - 1 x daily - 7 x weekly - 2 sets - 10 reps - 3 sec hold - Seated Isometric Hip Abduction with Resistance  - 1 x daily - 3-4 x weekly - 2 sets - 10 reps - 3 sec hold - Seated March with Resistance  - 1 x daily - 3-4 x weekly - 2 sets - 10 reps - 3 sec hold - Seated Isometric Hip Adduction with Ball  - 1 x daily - 3-4 x weekly - 2 sets - 10 reps - 3 sec hold   ASSESSMENT:  CLINICAL IMPRESSION: Kathy [name] reports her pain is at its lowest level since starting PT and no longer has any of the radicular LE pain.  She notes limited tolerance for supine exercises stating she has trouble getting her breath when laying down, therefore provided instructions in seated alternatives for some of the supine strengthening exercises.  Worked on transitional  movements with emphasis on good spinal posture and will plan for formal review of posture and body mechanics next visit.  Kathy [name] will benefit from skilled PT to address above deficits to improve mobility and activity tolerance with decreased pain interference.  OBJECTIVE IMPAIRMENTS: Abnormal gait, decreased activity tolerance, decreased balance, decreased endurance, decreased knowledge of condition, decreased mobility, difficulty walking, decreased ROM, decreased strength, increased fascial restrictions, impaired perceived functional ability, increased muscle spasms, impaired flexibility, improper body mechanics, postural dysfunction, and pain.   ACTIVITY LIMITATIONS: carrying, lifting, bending, standing, stairs, and locomotion level  PARTICIPATION LIMITATIONS: meal prep, cleaning, laundry, driving, and community activity  PERSONAL FACTORS: Age, Fitness, Past/current experiences, Time since onset of injury/illness/exacerbation, and 3+ comorbidities: HTN, DM-II, Asthma, Crohn's disease, GERD, IBS, CKD, fatigue, hip pain  are also affecting patient's functional outcome.   REHAB POTENTIAL: Good  CLINICAL DECISION MAKING: Evolving/moderate complexity  EVALUATION COMPLEXITY: Moderate   GOALS: Goals reviewed with patient? Yes  SHORT TERM GOALS: Target date: 12/15/2022   Patient will be independent with initial HEP to improve outcomes and carryover.  Baseline: TBD Goal status: MET  12/15/22  2.  Patient will report centralization of radicular symptoms.  Baseline: Intermittent B LE radicular pain down to ankles and feet with variable patterns Goal status: MET  12/15/22 - no incidents of radicular pain  LONG TERM GOALS: Target date: 01/12/2023  Patient will be independent with ongoing/advanced HEP for self-management at home.  Baseline:  Goal status: IN PROGRESS  12/20/22 - HEP updated  2.  Patient will report 50-75% improvement in low back pain to improve QOL.  Baseline: 2-3/10 on eval, up  to 8/10 at worst; B inner thigh pain/cramping "off the charts" when present Goal status: IN PROGRESS  3.  Patient to demonstrate ability to achieve and maintain good spinal alignment/posturing and body mechanics needed for daily activities. Baseline:  Goal status: IN PROGRESS  4.  Patient will demonstrate functional pain free lumbar ROM to perform ADLs.   Baseline: Limited and painful lumbar ROM in all planes - refer to above lumbar ROM table Goal status: IN PROGRESS  5.  Patient will demonstrate improved B LE strength to >/= 4 to 4+/5 for improved stability and ease of mobility . Baseline: Refer to above LE MMT table Goal status: IN PROGRESS  6.  Patient will report >/= 55 on lumbar FOTO to demonstrate improved functional ability.  Baseline: 44 Goal status: IN PROGRESS   7. Patient will report </= 16% on Modified Oswestry to demonstrate improved functional ability with decreased pain interference. Baseline: 14 / 50 = 28.0 % Goal status: IN PROGRESS  8.  Patient will tolerate 20-30 min of walking w/o increased pain to allow for improved mobility and activity tolerance so that she can resume walking for exercise. Baseline: Walking limited to <10-15 minutes Goal status: IN PROGRESS   PLAN:  PT FREQUENCY: 2x/week  PT DURATION: 8 weeks  PLANNED INTERVENTIONS: Therapeutic exercises, Therapeutic activity, Neuromuscular re-education, Balance training, Gait training, Patient/Family education, Self Care, Joint mobilization, Stair training, Aquatic Therapy, Dry Needling, Electrical stimulation, Spinal manipulation, Spinal mobilization, Cryotherapy, Moist heat, Taping, Traction, Ultrasound, Manual therapy, and Re-evaluation  PLAN FOR NEXT SESSION: posture & body mechanics education; MT to address abnormal muscle tension in lumbar paraspinals and R>L glutes/piriformis, possibly hip mobs; core/lumbopelvic flexibility and strengthening with extension-based preference.   Marry Guan,  PT 12/20/2022, 10:59 AM

## 2022-12-22 ENCOUNTER — Encounter: Payer: Self-pay | Admitting: Physical Therapy

## 2022-12-22 ENCOUNTER — Ambulatory Visit: Payer: PPO | Admitting: Physical Therapy

## 2022-12-22 DIAGNOSIS — R262 Difficulty in walking, not elsewhere classified: Secondary | ICD-10-CM

## 2022-12-22 DIAGNOSIS — M6281 Muscle weakness (generalized): Secondary | ICD-10-CM

## 2022-12-22 DIAGNOSIS — M62838 Other muscle spasm: Secondary | ICD-10-CM

## 2022-12-22 DIAGNOSIS — M5416 Radiculopathy, lumbar region: Secondary | ICD-10-CM

## 2022-12-22 DIAGNOSIS — M5459 Other low back pain: Secondary | ICD-10-CM

## 2022-12-22 NOTE — Therapy (Signed)
OUTPATIENT PHYSICAL THERAPY TREATMENT   Patient Name: Kathy [name] MRN: [id number] DOB:[birth year], 66 y.o., female Today's Date: 12/22/2022  END OF SESSION:  PT End of Session - 12/22/22 1100     Visit Number 7    Date for PT Re-Evaluation 01/12/23    Progress Note Due on Visit 10    PT Start Time 1100    PT Stop Time 1151    PT Time Calculation (min) 51 min    Activity Tolerance Patient tolerated treatment well    Behavior During Therapy [facility] for tasks assessed/performed                 Past Medical History:  Diagnosis Date   Anal stenosis    Asthma    09-23-2017  per pt has not done inhaler since May 2018, no insurance (QVAR bid and Ventolin prn)   B12 deficiency 01/05/2021   Crohn disease (HCC)    Diastolic dysfunction    per echo 06-23-2009  grade 2 diastolic dysfunction , ef 60-65%   Esophagitis    GERD (gastroesophageal reflux disease)    Hemorrhoids    Hiatal hernia    Hyperlipidemia    stopped meds due to side effects   Hypertension    09-23-2017  per pt has takes bp meds since May 2018 no insurance (losartan 100mg  qd, HCTZ 12.5mg  qd, and toprol 50 qd)   Microalbuminuria 06/01/2006   Qualifier: Diagnosis of  By: Allena Katz MD, Sejal     Type 2 diabetes mellitus (HCC)    09-23-2017 per pt has not taken diabetic meds since May 2018, no insurance (kombiglyze 5-1000mg  qd)   Past Surgical History:  Procedure Laterality Date   BREAST BIOPSY Bilateral    CARDIOVASCULAR STRESS TEST  07/29/2009   normal nuclear study w/ no ischemia/  normal LV function and wall motion,  ef 70%   CESAREAN SECTION  1980s   RECTAL BIOPSY N/A 10/03/2017   Procedure: POSSIBLE BIOPSY RECTAL;  Surgeon: Andria Meuse, MD;  Location: [facility];  Service: General;  Laterality: N/A;   Patient Active Problem List   Diagnosis Date Noted   Abnormal SPEP 11/06/2022   Fatigue 10/23/2022   Lower extremity edema 10/23/2022   Elevated serum protein level 10/23/2022   Uncontrolled  type 2 diabetes mellitus with hyperglycemia (HCC) 07/06/2022   Irritable bowel syndrome with constipation 03/10/2022   Lumbar radiculopathy 10/28/2021   Chronic kidney disease, stage 3b (HCC) 07/09/2021   Controlled type 2 diabetes mellitus without complication, without long-term current use of insulin (HCC) 07/09/2021   Hip pain 07/09/2021   B12 deficiency 01/05/2021   Tinea pedis of both feet 10/03/2020   Healthcare maintenance 05/19/2018   Crohn disease (HCC) 03/09/2018   Diastolic dysfunction 03/09/2018   GERD (gastroesophageal reflux disease) 03/09/2018   Migraine without aura 07/17/2008   Microalbuminuria 06/01/2006   Hyperlipidemia 05/30/2006   Essential hypertension 05/30/2006   Asthma 05/30/2006    PCP: Sandford Craze, NP   REFERRING PROVIDER: Sandford Craze, NP   REFERRING DIAG: M54.16 (ICD-10-CM) - Lumbar radiculopathy   THERAPY DIAG:  Radiculopathy, lumbar region  Other low back pain  Muscle weakness (generalized)  Other muscle spasm  Difficulty in walking, not elsewhere classified  RATIONALE FOR EVALUATION AND TREATMENT: Rehabilitation  ONSET DATE: chronic "on and off"  NEXT MD VISIT: 01/24/23   SUBJECTIVE:  SUBJECTIVE STATEMENT:  Pt reports today is a good day so far - no pain.  EVAL:  Pt reports the complaint that led to her PT referral was leg cramps in her B inner thighs - pain so intense that it was off the scale and almost caused her to faint. Low back pain has been an issue "on and off" for 2-3 years. Radicular pain varies in pattern and intensity, but does not note any specific numbness or tingling. She states her PCP wants her to be more active but she does not feel that she has the stamina to walk due to weakness - unable to walk for more  than 10-15 minutes.  PAIN: Are you having pain? No  PERTINENT HISTORY:  HTN, DM-II, Asthma, Crohn's disease, GERD, IBS, CKD, fatigue, hip pain  PRECAUTIONS: None  RED FLAGS: None  WEIGHT BEARING RESTRICTIONS: No  FALLS:  Has patient fallen in last 6 months? No  LIVING ENVIRONMENT: Lives with: lives alone Lives in: House/apartment Stairs: No Has following equipment at home: Single point cane  OCCUPATION: Retired  PLOF: Independent and Leisure: mostly sedentary other than ADLs  PATIENT GOALS: "I want to be able to increase my activity, esp do more walking."   OBJECTIVE: (objective measures completed at initial evaluation unless otherwise dated)  DIAGNOSTIC FINDINGS:  DG Lumbar spine ordered  PATIENT SURVEYS:  Modified Oswestry 14 / 50 = 28.0 %  FOTO Lumbar - 44; predicted - 55  SCREENING FOR RED FLAGS: Bowel or bladder incontinence: No Spinal tumors: No Cauda equina syndrome: No Compression fracture: No Abdominal aneurysm: No  COGNITION:  Overall cognitive status: Within functional limits for tasks assessed    SENSATION: WFL - pt denies radicular numbness or tingling  MUSCLE LENGTH: Hamstrings: Mild tight B ITB: Mild tight B Piriformis: Mod/severe tight bil Hip flexors: Mild tight B Quads: Mild/mod tight B Heelcord: NT  POSTURE:  No Significant postural limitations  PALPATION: Increased muscle tension and TTP in bilateral lower lumbar paraspinals at lumbosacral junction, as well as R>L glutes and piriformis. Lumbosacral hypomobility also noted.  LUMBAR ROM:   Active  eval  Flexion Hands to just above ankles ^  Extension 30% limited ^  Right lateral flexion Hand to mid shin ^  Left lateral flexion Hand to fib head ^  Right rotation 25% limited ^  Left rotation 25% limited ^  (Blank rows = not tested, ^ = increased pain)  LOWER EXTREMITY ROM:    B LE essentially WNL except for limitations in B hip rotation  LOWER EXTREMITY MMT:    MMT  Right eval Left eval  Hip flexion 4- 4-  Hip extension 3 3-  Hip abduction 4- 3+  Hip adduction 3- 3-  Hip internal rotation 4 4  Hip external rotation 4 4  Knee flexion 4+ 4+  Knee extension 4+ 4+  Ankle dorsiflexion 4+ 4+  Ankle plantarflexion    Ankle inversion    Ankle eversion     (Blank rows = not tested)  LUMBAR SPECIAL TESTS:  Straight leg raise test: Negative and Slump test: Negative  FUNCTIONAL TESTS:  Timed up and go (TUG): 15.27 sec 10 meter walk test: 13.15 sec Gait speed: 2.49 ft/sec  GAIT: Distance walked: 80 feet Assistive device utilized: None Level of assistance: Complete Independence Gait pattern: step to pattern, decreased stride length, decreased hip/knee flexion- Right, decreased hip/knee flexion- Left, and wide BOS Comments: Decreased gait speed   TODAY'S TREATMENT:   12/22/22 THERAPEUTIC EXERCISE: to  improve flexibility, strength and mobility.  Demonstration, verbal and tactile cues throughout for technique.  NuStep - L6 x 6 min (UE/LE)  Standing hip ABD with looped RTB at ankles x 10 bil Standing hip extension with looped RTB at ankles x 10 bil Standing TrA + RTB scap retraction/row x 10 Standing TrA + RTB scap retraction/B shoulder extension x 10 - cues to keep elbows straight Standing RTB pallof press x 10 bil  THERAPEUTIC ACTIVITIES: Provided education in proper posture and body mechanics for typical daily positioning and household chores to minimize strain on low back.   12/20/22 THERAPEUTIC EXERCISE: to improve flexibility, strength and mobility.  Demonstration, verbal and tactile cues throughout for technique.  NuStep - L5 x 6 min (UE/LE)  Seated TrA + RTB alt hip ABD/ER clam 10 x 3", 2 sets Seated TrA + RTB march 10 x 3" Seated TrA + hip ADD ball squeeze isometric 10 x 3" STS keeping back straight x 10 Functional squat + B UE raise 10 x 3"   12/15/22 THERAPEUTIC EXERCISE: to improve flexibility, strength and mobility.   Demonstration, verbal and tactile cues throughout for technique.  Bike L1 x 4 min LTR x 10 bil Seated hip hinge with orange pball x 5 Seated ab set with orange pball x 10  Seated hip Add stretch 3x10" bil Standing lumbar extension at wall 10x3" Runner stretch 3x10" bil Lateral hip drop at wall x 10 bil  Manual Therapy: to decrease muscle spasm, pain and improve mobility.  Passive stretching of bil hip flexors with towel under thigh Passive bil hip ER/IR all in prone   PATIENT EDUCATION:  Education details: HEP review and HEP update Person educated: Patient Education method: Explanation, Demonstration, Verbal cues, and Handouts Education comprehension: verbalized understanding, returned demonstration, verbal cues required, and needs further education  HOME EXERCISE PROGRAM: Access Code: [email or web address] URL: [email or web address] Date: 12/22/2022 Prepared by: Glenetta Hew  Exercises - Supine Lower Trunk Rotation  - 1 x daily - 7 x weekly - 3 sets - 10 reps - Supine Pelvic Tilt  - 1 x daily - 7 x weekly - 3 sets - 10 reps - Hooklying Single Knee to Chest  - 1 x daily - 7 x weekly - 3 sets - 10 reps - Seated Piriformis Stretch  - 1-2 x daily - 7 x weekly - 3 reps - 30 sec hold - Standing Lumbar Extension at Wall - Forearms  - 1 x daily - 7 x weekly - 2 sets - 10 reps - 3 sec hold - Seated Isometric Hip Abduction with Resistance  - 1 x daily - 3-4 x weekly - 2 sets - 10 reps - 3 sec hold - Seated March with Resistance  - 1 x daily - 3-4 x weekly - 2 sets - 10 reps - 3 sec hold - Seated Isometric Hip Adduction with Ball  - 1 x daily - 3-4 x weekly - 2 sets - 10 reps - 3 sec hold - Standing Shoulder Row with Anchored Resistance  - 1 x daily - 3 x weekly - 2 sets - 10 reps - 5 sec hold - Shoulder Extension with Resistance  - 1 x daily - 3 x weekly - 2 sets - 10 reps - 3 sec hold - Standing Anti-Rotation Press with Anchored Resistance  - 1 x daily - 3 x weekly - 2 sets - 10 reps -  3 sec hold  Patient Education - Hospital doctor  ASSESSMENT:  CLINICAL IMPRESSION: Graycelyn reports 20-25% improvement in her pain thus far with no pain today and no recent radicular pain. Provided education in proper posture and body mechanics for typical daily positioning and household chores to minimize strain on lumbar spine with pt noting several modifications she plans to try at home to help her back. Progressed therapeutic exercises to include resisted three-way hip and standing as well as introduction of core activation with postural strengthening for periscapular muscles with all new exercises well tolerated with no increased pain.  HEP updated to include upper body exercises.  Daylynn will benefit from continued skilled PT to address ongoing ROM, strength and postural deficits deficits to improve mobility and activity tolerance with decreased pain interference.   OBJECTIVE IMPAIRMENTS: Abnormal gait, decreased activity tolerance, decreased balance, decreased endurance, decreased knowledge of condition, decreased mobility, difficulty walking, decreased ROM, decreased strength, increased fascial restrictions, impaired perceived functional ability, increased muscle spasms, impaired flexibility, improper body mechanics, postural dysfunction, and pain.   ACTIVITY LIMITATIONS: carrying, lifting, bending, standing, stairs, and locomotion level  PARTICIPATION LIMITATIONS: meal prep, cleaning, laundry, driving, and community activity  PERSONAL FACTORS: Age, Fitness, Past/current experiences, Time since onset of injury/illness/exacerbation, and 3+ comorbidities: HTN, DM-II, Asthma, Crohn's disease, GERD, IBS, CKD, fatigue, hip pain  are also affecting patient's functional outcome.   REHAB POTENTIAL: Good  CLINICAL DECISION MAKING: Evolving/moderate complexity  EVALUATION COMPLEXITY: Moderate   GOALS: Goals reviewed with patient? Yes  SHORT TERM GOALS: Target date: 12/15/2022    Patient will be independent with initial HEP to improve outcomes and carryover.  Baseline: TBD Goal status: MET  12/15/22  2.  Patient will report centralization of radicular symptoms.  Baseline: Intermittent B LE radicular pain down to ankles and feet with variable patterns Goal status: MET  12/15/22 - no incidents of radicular pain  LONG TERM GOALS: Target date: 01/12/2023  Patient will be independent with ongoing/advanced HEP for self-management at home.  Baseline:  Goal status: IN PROGRESS  12/20/22 - HEP updated  2.  Patient will report 50-75% improvement in low back pain to improve QOL.  Baseline: 2-3/10 on eval, up to 8/10 at worst; B inner thigh pain/cramping "off the charts" when present Goal status: IN PROGRESS  12/22/22 - Pt reports 20-25% improvement  3.  Patient to demonstrate ability to achieve and maintain good spinal alignment/posturing and body mechanics needed for daily activities. Baseline:  Goal status: IN PROGRESS  12/22/22 - Education provided on posture and body mechanics  4.  Patient will demonstrate functional pain free lumbar ROM to perform ADLs.   Baseline: Limited and painful lumbar ROM in all planes - refer to above lumbar ROM table Goal status: IN PROGRESS  5.  Patient will demonstrate improved B LE strength to >/= 4 to 4+/5 for improved stability and ease of mobility . Baseline: Refer to above LE MMT table Goal status: IN PROGRESS  6.  Patient will report >/= 55 on lumbar FOTO to demonstrate improved functional ability.  Baseline: 44 Goal status: IN PROGRESS   7. Patient will report </= 16% on Modified Oswestry to demonstrate improved functional ability with decreased pain interference. Baseline: 14 / 50 = 28.0 % Goal status: IN PROGRESS  8.  Patient will tolerate 20-30 min of walking w/o increased pain to allow for improved mobility and activity tolerance so that she can resume walking for exercise. Baseline: Walking limited to <10-15 minutes Goal  status: IN PROGRESS   PLAN:  PT FREQUENCY: 2x/week  PT DURATION: 8 weeks  PLANNED INTERVENTIONS: Therapeutic exercises, Therapeutic activity, Neuromuscular re-education, Balance training, Gait training, Patient/Family education, Self Care, Joint mobilization, Stair training, Aquatic Therapy, Dry Needling, Electrical stimulation, Spinal manipulation, Spinal mobilization, Cryotherapy, Moist heat, Taping, Traction, Ultrasound, Manual therapy, and Re-evaluation  PLAN FOR NEXT SESSION: Progress core/lumbopelvic flexibility and strengthening with extension-based preference; MT to address abnormal muscle tension in lumbar paraspinals and R>L glutes/piriformis, possibly hip mobs; review posture & body mechanics education as needed.   Marry Guan, PT 12/22/2022, 12:00 PM

## 2022-12-24 ENCOUNTER — Other Ambulatory Visit: Payer: Self-pay | Admitting: Nephrology

## 2022-12-24 DIAGNOSIS — R809 Proteinuria, unspecified: Secondary | ICD-10-CM | POA: Diagnosis not present

## 2022-12-24 DIAGNOSIS — D631 Anemia in chronic kidney disease: Secondary | ICD-10-CM | POA: Diagnosis not present

## 2022-12-24 DIAGNOSIS — N2581 Secondary hyperparathyroidism of renal origin: Secondary | ICD-10-CM | POA: Diagnosis not present

## 2022-12-24 DIAGNOSIS — I129 Hypertensive chronic kidney disease with stage 1 through stage 4 chronic kidney disease, or unspecified chronic kidney disease: Secondary | ICD-10-CM | POA: Diagnosis not present

## 2022-12-24 DIAGNOSIS — N1832 Chronic kidney disease, stage 3b: Secondary | ICD-10-CM

## 2022-12-24 DIAGNOSIS — E1122 Type 2 diabetes mellitus with diabetic chronic kidney disease: Secondary | ICD-10-CM | POA: Diagnosis not present

## 2022-12-26 LAB — LAB REPORT - SCANNED
Albumin, Urine POC: 50.8
Creatinine, POC: 206.4 mg/dL
EGFR: 39
Microalb Creat Ratio: 25

## 2022-12-28 ENCOUNTER — Telehealth: Payer: Self-pay | Admitting: Pharmacy Technician

## 2022-12-28 NOTE — Telephone Encounter (Signed)
Kathy [name] and Kathy [name] Please be on the lookout for these forms and I will sign them Thanks JMP

## 2022-12-28 NOTE — Telephone Encounter (Signed)
Dr. Rhea Belton,  Re-enrollment forms for Remicade PAP (free drug) have been faxed to your office for signature. Please sign forms and return them to me.  Thanks Selena Batten

## 2022-12-29 ENCOUNTER — Encounter: Payer: Self-pay | Admitting: Physical Therapy

## 2022-12-29 ENCOUNTER — Ambulatory Visit: Payer: PPO | Attending: Family | Admitting: Physical Therapy

## 2022-12-29 DIAGNOSIS — R262 Difficulty in walking, not elsewhere classified: Secondary | ICD-10-CM | POA: Diagnosis not present

## 2022-12-29 DIAGNOSIS — M5416 Radiculopathy, lumbar region: Secondary | ICD-10-CM | POA: Diagnosis not present

## 2022-12-29 DIAGNOSIS — M62838 Other muscle spasm: Secondary | ICD-10-CM

## 2022-12-29 DIAGNOSIS — M5459 Other low back pain: Secondary | ICD-10-CM | POA: Diagnosis not present

## 2022-12-29 DIAGNOSIS — M6281 Muscle weakness (generalized): Secondary | ICD-10-CM

## 2022-12-29 NOTE — Telephone Encounter (Signed)
Form signed.

## 2022-12-29 NOTE — Telephone Encounter (Signed)
Form has been placed in Dr. Lauro Franklin inbox.

## 2022-12-29 NOTE — Therapy (Signed)
OUTPATIENT PHYSICAL THERAPY TREATMENT   Patient Name: Kathy [name] MRN: [id number] DOB:10/02/1955, 67 y.o., female Today's Date: 12/29/2022  END OF SESSION:  PT End of Session - 12/29/22 1016     Visit Number 8    Date for PT Re-Evaluation 01/12/23    Progress Note Due on Visit 10    PT Start Time 1016    PT Stop Time 1058    PT Time Calculation (min) 42 min    Activity Tolerance Patient tolerated treatment well    Behavior During Therapy [facility] for tasks assessed/performed                 Past Medical History:  Diagnosis Date   Anal stenosis    Asthma    09-23-2017  per pt has not done inhaler since May 2018, no insurance (QVAR bid and Ventolin prn)   B12 deficiency 01/05/2021   Crohn disease (HCC)    Diastolic dysfunction    per echo 06-23-2009  grade 2 diastolic dysfunction , ef 60-65%   Esophagitis    GERD (gastroesophageal reflux disease)    Hemorrhoids    Hiatal hernia    Hyperlipidemia    stopped meds due to side effects   Hypertension    09-23-2017  per pt has takes bp meds since May 2018 no insurance (losartan 100mg  qd, HCTZ 12.5mg  qd, and toprol 50 qd)   Microalbuminuria 06/01/2006   Qualifier: Diagnosis of  By: Allena Katz MD, Sejal     Type 2 diabetes mellitus (HCC)    09-23-2017 per pt has not taken diabetic meds since May 2018, no insurance (kombiglyze 5-1000mg  qd)   Past Surgical History:  Procedure Laterality Date   BREAST BIOPSY Bilateral    CARDIOVASCULAR STRESS TEST  07/29/2009   normal nuclear study w/ no ischemia/  normal LV function and wall motion,  ef 70%   CESAREAN SECTION  1980s   RECTAL BIOPSY N/A 10/03/2017   Procedure: POSSIBLE BIOPSY RECTAL;  Surgeon: Andria Meuse, MD;  Location: [facility];  Service: General;  Laterality: N/A;   Patient Active Problem List   Diagnosis Date Noted   Abnormal SPEP 11/06/2022   Fatigue 10/23/2022   Lower extremity edema 10/23/2022   Elevated serum protein level 10/23/2022   Uncontrolled type  2 diabetes mellitus with hyperglycemia (HCC) 07/06/2022   Irritable bowel syndrome with constipation 03/10/2022   Lumbar radiculopathy 10/28/2021   Chronic kidney disease, stage 3b (HCC) 07/09/2021   Controlled type 2 diabetes mellitus without complication, without long-term current use of insulin (HCC) 07/09/2021   Hip pain 07/09/2021   B12 deficiency 01/05/2021   Tinea pedis of both feet 10/03/2020   Healthcare maintenance 05/19/2018   Crohn disease (HCC) 03/09/2018   Diastolic dysfunction 03/09/2018   GERD (gastroesophageal reflux disease) 03/09/2018   Migraine without aura 07/17/2008   Microalbuminuria 06/01/2006   Hyperlipidemia 05/30/2006   Essential hypertension 05/30/2006   Asthma 05/30/2006    PCP: Sandford Craze, NP   REFERRING PROVIDER: Sandford Craze, NP   REFERRING DIAG: M54.16 (ICD-10-CM) - Lumbar radiculopathy   THERAPY DIAG:  Radiculopathy, lumbar region  Other low back pain  Muscle weakness (generalized)  Other muscle spasm  Difficulty in walking, not elsewhere classified  RATIONALE FOR EVALUATION AND TREATMENT: Rehabilitation  ONSET DATE: chronic "on and off"  NEXT MD VISIT: 01/24/23   SUBJECTIVE:  SUBJECTIVE STATEMENT:  Pt reports she started off with a little bit of pain today but was able to work it out.  EVAL:  Pt reports the complaint that led to her PT referral was leg cramps in her B inner thighs - pain so intense that it was off the scale and almost caused her to faint. Low back pain has been an issue "on and off" for 2-3 years. Radicular pain varies in pattern and intensity, but does not note any specific numbness or tingling. She states her PCP wants her to be more active but she does not feel that she has the stamina to walk due to  weakness - unable to walk for more than 10-15 minutes.  PAIN: Are you having pain? No  PERTINENT HISTORY:  HTN, DM-II, Asthma, Crohn's disease, GERD, IBS, CKD, fatigue, hip pain  PRECAUTIONS: None  RED FLAGS: None  WEIGHT BEARING RESTRICTIONS: No  FALLS:  Has patient fallen in last 6 months? No  LIVING ENVIRONMENT: Lives with: lives alone Lives in: House/apartment Stairs: No Has following equipment at home: Single point cane  OCCUPATION: Retired  PLOF: Independent and Leisure: mostly sedentary other than ADLs  PATIENT GOALS: "I want to be able to increase my activity, esp do more walking."   OBJECTIVE: (objective measures completed at initial evaluation unless otherwise dated)  DIAGNOSTIC FINDINGS:  DG Lumbar spine ordered  PATIENT SURVEYS:  Modified Oswestry 14 / 50 = 28.0 %  FOTO Lumbar - 44; predicted - 55  SCREENING FOR RED FLAGS: Bowel or bladder incontinence: No Spinal tumors: No Cauda equina syndrome: No Compression fracture: No Abdominal aneurysm: No  COGNITION:  Overall cognitive status: Within functional limits for tasks assessed    SENSATION: WFL - pt denies radicular numbness or tingling  MUSCLE LENGTH: Hamstrings: Mild tight B ITB: Mild tight B Piriformis: Mod/severe tight bil Hip flexors: Mild tight B Quads: Mild/mod tight B Heelcord: NT  POSTURE:  No Significant postural limitations  PALPATION: Increased muscle tension and TTP in bilateral lower lumbar paraspinals at lumbosacral junction, as well as R>L glutes and piriformis. Lumbosacral hypomobility also noted.  LUMBAR ROM:   Active  eval 12/29/22  Flexion Hands to just above ankles ^ WFL - no pain  Extension 30% limited ^ 10% limited - pulling  Right lateral flexion Hand to mid shin ^ Hand to mid shin   Left lateral flexion Hand to fib head ^ Hand to fib head - ^/pulling  Right rotation 25% limited ^ 20% limited ^  Left rotation 25% limited ^ WFL - no pain  (Blank rows = not  tested, ^ = increased pain)  LOWER EXTREMITY ROM:    B LE essentially WNL except for limitations in B hip rotation  LOWER EXTREMITY MMT:    MMT Right eval Left eval R 12/29/22 L 12/29/22  Hip flexion 4- 4- 4 4  Hip extension 3 3- 3+ 3+  Hip abduction 4- 3+ 4+ 4-  Hip adduction 3- 3- 3+ 3+  Hip internal rotation 4 4 4+ 4+  Hip external rotation 4 4 4  limited ROM 4 limited ROM  Knee flexion 4+ 4+ 5 5  Knee extension 4+ 4+ 5 5  Ankle dorsiflexion 4+ 4+ 5 5  Ankle plantarflexion      Ankle inversion      Ankle eversion       (Blank rows = not tested)  LUMBAR SPECIAL TESTS:  Straight leg raise test: Negative and Slump test: Negative  FUNCTIONAL TESTS:  Timed up and go (TUG): 15.27 sec 10 meter walk test: 13.15 sec Gait speed: 2.49 ft/sec  GAIT: Distance walked: 80 feet Assistive device utilized: None Level of assistance: Complete Independence Gait pattern: step to pattern, decreased stride length, decreased hip/knee flexion- Right, decreased hip/knee flexion- Left, and wide BOS Comments: Decreased gait speed   TODAY'S TREATMENT:   12/29/22 THERAPEUTIC EXERCISE: to improve flexibility, strength and mobility.  Demonstration, verbal and tactile cues throughout for technique.  NuStep - L6 x 6 min (UE/LE)  Prone with pillow under abdomen/hips unilateral hip extension 2 x 5 bil Quadruped cat cow x 8 - limited by UE fatigue Quadruped over peanut ball unilateral hip extension 2 x 5 bil - better tolerated than prone Bridge + RTB hip ABD isometric 10 x 3" S/L clam x 10 bil, looped RTB at knees for R clam only, no resistance on L  THERAPEUTIC ACTIVITIES: Lumbar ROM LE MMT   12/22/22 THERAPEUTIC EXERCISE: to improve flexibility, strength and mobility.  Demonstration, verbal and tactile cues throughout for technique.  NuStep - L6 x 6 min (UE/LE)  Standing hip ABD with looped RTB at ankles x 10 bil Standing hip extension with looped RTB at ankles x 10 bil Standing TrA + RTB scap  retraction/row x 10 Standing TrA + RTB scap retraction/B shoulder extension x 10 - cues to keep elbows straight Standing RTB pallof press x 10 bil  THERAPEUTIC ACTIVITIES: Provided education in proper posture and body mechanics for typical daily positioning and household chores to minimize strain on low back.   12/20/22 THERAPEUTIC EXERCISE: to improve flexibility, strength and mobility.  Demonstration, verbal and tactile cues throughout for technique.  NuStep - L5 x 6 min (UE/LE)  Seated TrA + RTB alt hip ABD/ER clam 10 x 3", 2 sets Seated TrA + RTB march 10 x 3" Seated TrA + hip ADD ball squeeze isometric 10 x 3" STS keeping back straight x 10 Functional squat + B UE raise 10 x 3"   PATIENT EDUCATION:  Education details: HEP review and HEP update Person educated: Patient Education method: Explanation, Demonstration, Verbal cues, and Handouts Education comprehension: verbalized understanding, returned demonstration, verbal cues required, and needs further education  HOME EXERCISE PROGRAM: Access Code: [email or web address] URL: [email or web address] Date: 12/22/2022 Prepared by: Glenetta Hew  Exercises - Supine Lower Trunk Rotation  - 1 x daily - 7 x weekly - 3 sets - 10 reps - Supine Pelvic Tilt  - 1 x daily - 7 x weekly - 3 sets - 10 reps - Hooklying Single Knee to Chest  - 1 x daily - 7 x weekly - 3 sets - 10 reps - Seated Piriformis Stretch  - 1-2 x daily - 7 x weekly - 3 reps - 30 sec hold - Standing Lumbar Extension at Wall - Forearms  - 1 x daily - 7 x weekly - 2 sets - 10 reps - 3 sec hold - Seated Isometric Hip Abduction with Resistance  - 1 x daily - 3-4 x weekly - 2 sets - 10 reps - 3 sec hold - Seated March with Resistance  - 1 x daily - 3-4 x weekly - 2 sets - 10 reps - 3 sec hold - Seated Isometric Hip Adduction with Ball  - 1 x daily - 3-4 x weekly - 2 sets - 10 reps - 3 sec hold - Standing Shoulder Row with Anchored Resistance  - 1 x daily - 3 x weekly - 2 sets  - 10  reps - 5 sec hold - Shoulder Extension with Resistance  - 1 x daily - 3 x weekly - 2 sets - 10 reps - 3 sec hold - Standing Anti-Rotation Press with Anchored Resistance  - 1 x daily - 3 x weekly - 2 sets - 10 reps - 3 sec hold  Patient Education - Posture and Body Mechanics   ASSESSMENT:  CLINICAL IMPRESSION: Kathy [name] reports she keep the information about the good posture and body mechanics in mind on a recent shopping trip and was careful to make sure she balanced the weight as she was carrying her shopping bags. Lumbar ROM improving with decreased pain but still some pulling noted in several directions.  Overall strength improving with knees and ankles now 5/5, but proximal weakness still evident at hips. Attempted to progress extensor strengthening but limited ROM in prone and limited tolerance for UE weight bearing in quadruped but able to perform exercises with peanut ball for support under torso.  Kathy [name] will benefit from continued skilled PT to address ongoing ROM, strength and postural deficits deficits to improve mobility and activity tolerance with decreased pain interference.   OBJECTIVE IMPAIRMENTS: Abnormal gait, decreased activity tolerance, decreased balance, decreased endurance, decreased knowledge of condition, decreased mobility, difficulty walking, decreased ROM, decreased strength, increased fascial restrictions, impaired perceived functional ability, increased muscle spasms, impaired flexibility, improper body mechanics, postural dysfunction, and pain.   ACTIVITY LIMITATIONS: carrying, lifting, bending, standing, stairs, and locomotion level  PARTICIPATION LIMITATIONS: meal prep, cleaning, laundry, driving, and community activity  PERSONAL FACTORS: Age, Fitness, Past/current experiences, Time since onset of injury/illness/exacerbation, and 3+ comorbidities: HTN, DM-II, Asthma, Crohn's disease, GERD, IBS, CKD, fatigue, hip pain  are also affecting patient's functional  outcome.   REHAB POTENTIAL: Good  CLINICAL DECISION MAKING: Evolving/moderate complexity  EVALUATION COMPLEXITY: Moderate   GOALS: Goals reviewed with patient? Yes  SHORT TERM GOALS: Target date: 12/15/2022   Patient will be independent with initial HEP to improve outcomes and carryover.  Baseline: TBD Goal status: MET  12/15/22  2.  Patient will report centralization of radicular symptoms.  Baseline: Intermittent B LE radicular pain down to ankles and feet with variable patterns Goal status: MET  12/15/22 - no incidents of radicular pain  LONG TERM GOALS: Target date: 01/12/2023  Patient will be independent with ongoing/advanced HEP for self-management at home.  Baseline:  Goal status: IN PROGRESS  12/20/22 - HEP updated  2.  Patient will report 50-75% improvement in low back pain to improve QOL.  Baseline: 2-3/10 on eval, up to 8/10 at worst; B inner thigh pain/cramping "off the charts" when present Goal status: IN PROGRESS  12/22/22 - Pt reports 20-25% improvement  3.  Patient to demonstrate ability to achieve and maintain good spinal alignment/posturing and body mechanics needed for daily activities. Baseline: 12/22/22 - Education provided on posture and body mechanics Goal status: MET  12/29/22  4.  Patient will demonstrate functional pain free lumbar ROM to perform ADLs.   Baseline: Limited and painful lumbar ROM in all planes - refer to above lumbar ROM table Goal status: IN PROGRESS  12/29/22 - met for flexion and L rotation  5.  Patient will demonstrate improved B LE strength to >/= 4 to 4+/5 for improved stability and ease of mobility . Baseline: Refer to above LE MMT table Goal status: IN PROGRESS  12/29/22 - met for knees and ankles, continued proximal weakness at hips  6.  Patient will report >/= 55 on lumbar FOTO to  demonstrate improved functional ability.  Baseline: 44 Goal status: IN PROGRESS   7. Patient will report </= 16% on Modified Oswestry to demonstrate  improved functional ability with decreased pain interference. Baseline: 14 / 50 = 28.0 % Goal status: IN PROGRESS  8.  Patient will tolerate 20-30 min of walking w/o increased pain to allow for improved mobility and activity tolerance so that she can resume walking for exercise. Baseline: Walking limited to <10-15 minutes Goal status: IN PROGRESS   PLAN:  PT FREQUENCY: 2x/week  PT DURATION: 8 weeks  PLANNED INTERVENTIONS: Therapeutic exercises, Therapeutic activity, Neuromuscular re-education, Balance training, Gait training, Patient/Family education, Self Care, Joint mobilization, Stair training, Aquatic Therapy, Dry Needling, Electrical stimulation, Spinal manipulation, Spinal mobilization, Cryotherapy, Moist heat, Taping, Traction, Ultrasound, Manual therapy, and Re-evaluation  PLAN FOR NEXT SESSION: Progress core/lumbopelvic flexibility and strengthening with extension-based preference; MT to address abnormal muscle tension in lumbar paraspinals and R>L glutes/piriformis, possibly hip mobs; review posture & body mechanics education as needed.   Marry Guan, PT 12/29/2022, 2:43 PM

## 2022-12-29 NOTE — Telephone Encounter (Signed)
Form just faxed back over.

## 2022-12-30 ENCOUNTER — Other Ambulatory Visit: Payer: Self-pay | Admitting: Family

## 2022-12-30 ENCOUNTER — Other Ambulatory Visit: Payer: Self-pay | Admitting: Internal Medicine

## 2022-12-30 DIAGNOSIS — Z8679 Personal history of other diseases of the circulatory system: Secondary | ICD-10-CM

## 2023-01-03 ENCOUNTER — Ambulatory Visit: Payer: PPO | Admitting: Physical Therapy

## 2023-01-03 ENCOUNTER — Encounter: Payer: Self-pay | Admitting: Physical Therapy

## 2023-01-03 ENCOUNTER — Ambulatory Visit
Admission: RE | Admit: 2023-01-03 | Discharge: 2023-01-03 | Disposition: A | Discharge status: Home / Self Care | Payer: PPO | Source: Ambulatory Visit | Attending: Nephrology

## 2023-01-03 DIAGNOSIS — M6281 Muscle weakness (generalized): Secondary | ICD-10-CM

## 2023-01-03 DIAGNOSIS — M62838 Other muscle spasm: Secondary | ICD-10-CM

## 2023-01-03 DIAGNOSIS — M5459 Other low back pain: Secondary | ICD-10-CM

## 2023-01-03 DIAGNOSIS — N1832 Chronic kidney disease, stage 3b: Secondary | ICD-10-CM

## 2023-01-03 DIAGNOSIS — N281 Cyst of kidney, acquired: Secondary | ICD-10-CM | POA: Diagnosis not present

## 2023-01-03 DIAGNOSIS — M5416 Radiculopathy, lumbar region: Secondary | ICD-10-CM | POA: Diagnosis not present

## 2023-01-03 DIAGNOSIS — R262 Difficulty in walking, not elsewhere classified: Secondary | ICD-10-CM

## 2023-01-03 DIAGNOSIS — N183 Chronic kidney disease, stage 3 unspecified: Secondary | ICD-10-CM | POA: Diagnosis not present

## 2023-01-03 NOTE — Therapy (Signed)
OUTPATIENT PHYSICAL THERAPY TREATMENT   Patient Name: Kathy [name] MRN: [id number] DOB:07/04/1955, 67 y.o., female Today's Date: 01/03/2023  END OF SESSION:  PT End of Session - 01/03/23 1022     Visit Number 9    Date for PT Re-Evaluation 01/12/23    Progress Note Due on Visit 10    PT Start Time 1022   Pt arrived late   PT Stop Time 1100    PT Time Calculation (min) 38 min    Activity Tolerance Patient tolerated treatment well    Behavior During Therapy [facility] for tasks assessed/performed                  Past Medical History:  Diagnosis Date   Anal stenosis    Asthma    09-23-2017  per pt has not done inhaler since May 2018, no insurance (QVAR bid and Ventolin prn)   B12 deficiency 01/05/2021   Crohn disease (HCC)    Diastolic dysfunction    per echo 06-23-2009  grade 2 diastolic dysfunction , ef 60-65%   Esophagitis    GERD (gastroesophageal reflux disease)    Hemorrhoids    Hiatal hernia    Hyperlipidemia    stopped meds due to side effects   Hypertension    09-23-2017  per pt has takes bp meds since May 2018 no insurance (losartan 100mg  qd, HCTZ 12.5mg  qd, and toprol 50 qd)   Microalbuminuria 06/01/2006   Qualifier: Diagnosis of  By: Allena Katz MD, Sejal     Type 2 diabetes mellitus (HCC)    09-23-2017 per pt has not taken diabetic meds since May 2018, no insurance (kombiglyze 5-1000mg  qd)   Past Surgical History:  Procedure Laterality Date   BREAST BIOPSY Bilateral    CARDIOVASCULAR STRESS TEST  07/29/2009   normal nuclear study w/ no ischemia/  normal LV function and wall motion,  ef 70%   CESAREAN SECTION  1980s   RECTAL BIOPSY N/A 10/03/2017   Procedure: POSSIBLE BIOPSY RECTAL;  Surgeon: Andria Meuse, MD;  Location: [facility];  Service: General;  Laterality: N/A;   Patient Active Problem List   Diagnosis Date Noted   Abnormal SPEP 11/06/2022   Fatigue 10/23/2022   Lower extremity edema 10/23/2022   Elevated serum protein level 10/23/2022    Uncontrolled type 2 diabetes mellitus with hyperglycemia (HCC) 07/06/2022   Irritable bowel syndrome with constipation 03/10/2022   Lumbar radiculopathy 10/28/2021   Chronic kidney disease, stage 3b (HCC) 07/09/2021   Controlled type 2 diabetes mellitus without complication, without long-term current use of insulin (HCC) 07/09/2021   Hip pain 07/09/2021   B12 deficiency 01/05/2021   Tinea pedis of both feet 10/03/2020   Healthcare maintenance 05/19/2018   Crohn disease (HCC) 03/09/2018   Diastolic dysfunction 03/09/2018   GERD (gastroesophageal reflux disease) 03/09/2018   Migraine without aura 07/17/2008   Microalbuminuria 06/01/2006   Hyperlipidemia 05/30/2006   Essential hypertension 05/30/2006   Asthma 05/30/2006    PCP: Sandford Craze, NP   REFERRING PROVIDER: Sandford Craze, NP   REFERRING DIAG: M54.16 (ICD-10-CM) - Lumbar radiculopathy   THERAPY DIAG:  Radiculopathy, lumbar region  Other low back pain  Muscle weakness (generalized)  Other muscle spasm  Difficulty in walking, not elsewhere classified  RATIONALE FOR EVALUATION AND TREATMENT: Rehabilitation  ONSET DATE: chronic "on and off"  NEXT MD VISIT: 01/24/23   SUBJECTIVE:  SUBJECTIVE STATEMENT:  Pt reports no pain this morning.  EVAL:  Pt reports the complaint that led to her PT referral was leg cramps in her B inner thighs - pain so intense that it was off the scale and almost caused her to faint. Low back pain has been an issue "on and off" for 2-3 years. Radicular pain varies in pattern and intensity, but does not note any specific numbness or tingling. She states her PCP wants her to be more active but she does not feel that she has the stamina to walk due to weakness - unable to walk for more  than 10-15 minutes.  PAIN: Are you having pain? No  PERTINENT HISTORY:  HTN, DM-II, Asthma, Crohn's disease, GERD, IBS, CKD, fatigue, hip pain  PRECAUTIONS: None  RED FLAGS: None  WEIGHT BEARING RESTRICTIONS: No  FALLS:  Has patient fallen in last 6 months? No  LIVING ENVIRONMENT: Lives with: lives alone Lives in: House/apartment Stairs: No Has following equipment at home: Single point cane  OCCUPATION: Retired  PLOF: Independent and Leisure: mostly sedentary other than ADLs  PATIENT GOALS: "I want to be able to increase my activity, esp do more walking."   OBJECTIVE: (objective measures completed at initial evaluation unless otherwise dated)  DIAGNOSTIC FINDINGS:  DG Lumbar spine ordered  PATIENT SURVEYS:  Modified Oswestry 14 / 50 = 28.0 %  FOTO Lumbar - 44; predicted - 55  SCREENING FOR RED FLAGS: Bowel or bladder incontinence: No Spinal tumors: No Cauda equina syndrome: No Compression fracture: No Abdominal aneurysm: No  COGNITION:  Overall cognitive status: Within functional limits for tasks assessed    SENSATION: WFL - pt denies radicular numbness or tingling  MUSCLE LENGTH: Hamstrings: Mild tight B ITB: Mild tight B Piriformis: Mod/severe tight bil Hip flexors: Mild tight B Quads: Mild/mod tight B Heelcord: NT  POSTURE:  No Significant postural limitations  PALPATION: Increased muscle tension and TTP in bilateral lower lumbar paraspinals at lumbosacral junction, as well as R>L glutes and piriformis. Lumbosacral hypomobility also noted.  LUMBAR ROM:   Active  eval 12/29/22  Flexion Hands to just above ankles ^ WFL - no pain  Extension 30% limited ^ 10% limited - pulling  Right lateral flexion Hand to mid shin ^ Hand to mid shin   Left lateral flexion Hand to fib head ^ Hand to fib head - ^/pulling  Right rotation 25% limited ^ 20% limited ^  Left rotation 25% limited ^ WFL - no pain  (Blank rows = not tested, ^ = increased  pain)  LOWER EXTREMITY ROM:    B LE essentially WNL except for limitations in B hip rotation  LOWER EXTREMITY MMT:    MMT Right eval Left eval R 12/29/22 L 12/29/22  Hip flexion 4- 4- 4 4  Hip extension 3 3- 3+ 3+  Hip abduction 4- 3+ 4+ 4-  Hip adduction 3- 3- 3+ 3+  Hip internal rotation 4 4 4+ 4+  Hip external rotation 4 4 4  limited ROM 4 limited ROM  Knee flexion 4+ 4+ 5 5  Knee extension 4+ 4+ 5 5  Ankle dorsiflexion 4+ 4+ 5 5  Ankle plantarflexion      Ankle inversion      Ankle eversion       (Blank rows = not tested)  LUMBAR SPECIAL TESTS:  Straight leg raise test: Negative and Slump test: Negative  FUNCTIONAL TESTS:  Timed up and go (TUG): 15.27 sec 10 meter walk test: 13.15  sec Gait speed: 2.49 ft/sec  GAIT: Distance walked: 80 feet Assistive device utilized: None Level of assistance: Complete Independence Gait pattern: step to pattern, decreased stride length, decreased hip/knee flexion- Right, decreased hip/knee flexion- Left, and wide BOS Comments: Decreased gait speed   TODAY'S TREATMENT:   01/03/23 THERAPEUTIC EXERCISE: to improve flexibility, strength and mobility.  Demonstration, verbal and tactile cues throughout for technique.  UBE: L1.0 x 6 min (3' each fwd & back) Quadruped over peanut ball alt unilateral hip extension x 10 bil Quadruped over peanut ball alt unilateral arm raise x 10 bil Quadruped over peanut ball alt bird dog x 10 bil TRX squat x 10 Functional squat + B UE raise 10 x 3" B side-step in slight squat with looped RTB at ankles 2 x 10 ft along counter Standing TrA + GTB scap retraction/row 2 x 10 Standing TrA + GTB scap retraction/B shoulder extension 2 x 10 - cues to keep elbows straight Standing GTB pallof press x 10 bil Standing GTB pallof press + short-arc trunk rotation x 5 - discontinued d/t cramping in her hands   12/29/22 THERAPEUTIC EXERCISE: to improve flexibility, strength and mobility.  Demonstration, verbal and tactile  cues throughout for technique.  NuStep - L6 x 6 min (UE/LE)  Prone with pillow under abdomen/hips unilateral hip extension 2 x 5 bil Quadruped cat cow x 8 - limited by UE fatigue Quadruped over peanut ball unilateral hip extension 2 x 5 bil - better tolerated than prone Bridge + RTB hip ABD isometric 10 x 3" S/L clam x 10 bil, looped RTB at knees for R clam only, no resistance on L  THERAPEUTIC ACTIVITIES: Lumbar ROM LE MMT   12/22/22 THERAPEUTIC EXERCISE: to improve flexibility, strength and mobility.  Demonstration, verbal and tactile cues throughout for technique.  NuStep - L6 x 6 min (UE/LE)  Standing hip ABD with looped RTB at ankles x 10 bil Standing hip extension with looped RTB at ankles x 10 bil Standing TrA + RTB scap retraction/row x 10 Standing TrA + RTB scap retraction/B shoulder extension x 10 - cues to keep elbows straight Standing RTB pallof press x 10 bil  THERAPEUTIC ACTIVITIES: Provided education in proper posture and body mechanics for typical daily positioning and household chores to minimize strain on low back.   PATIENT EDUCATION:  Education details: HEP review and HEP update Person educated: Patient Education method: Explanation, Demonstration, Verbal cues, and Handouts Education comprehension: verbalized understanding, returned demonstration, verbal cues required, and needs further education  HOME EXERCISE PROGRAM: Access Code: [email or web address] URL: [email or web address] Date: 12/22/2022 Prepared by: Glenetta Hew  Exercises - Supine Lower Trunk Rotation  - 1 x daily - 7 x weekly - 3 sets - 10 reps - Supine Pelvic Tilt  - 1 x daily - 7 x weekly - 3 sets - 10 reps - Hooklying Single Knee to Chest  - 1 x daily - 7 x weekly - 3 sets - 10 reps - Seated Piriformis Stretch  - 1-2 x daily - 7 x weekly - 3 reps - 30 sec hold - Standing Lumbar Extension at Wall - Forearms  - 1 x daily - 7 x weekly - 2 sets - 10 reps - 3 sec hold - Seated Isometric Hip  Abduction with Resistance  - 1 x daily - 3-4 x weekly - 2 sets - 10 reps - 3 sec hold - Seated March with Resistance  - 1 x daily - 3-4 x weekly - 2 sets -  10 reps - 3 sec hold - Seated Isometric Hip Adduction with Ball  - 1 x daily - 3-4 x weekly - 2 sets - 10 reps - 3 sec hold - Standing Shoulder Row with Anchored Resistance  - 1 x daily - 3 x weekly - 2 sets - 10 reps - 5 sec hold - Shoulder Extension with Resistance  - 1 x daily - 3 x weekly - 2 sets - 10 reps - 3 sec hold - Standing Anti-Rotation Press with Anchored Resistance  - 1 x daily - 3 x weekly - 2 sets - 10 reps - 3 sec hold  Patient Education - Posture and Body Mechanics   ASSESSMENT:  CLINICAL IMPRESSION: Saffire denies pain today. She was able to progress extensor strengthening in prone over peanut ball but with limited tolerance as ball compression on her abdomen made her feel SOB. Continued strengthening shifted to more standing exercises targeting core activation with both UE and LE strengthening with good tolerance other than limited with pallof press + trunk rotation due to cramping in hand. Emine will benefit from continued skilled PT to address ongoing ROM, strength and postural deficits deficits to improve mobility and activity tolerance with decreased pain interference. Will complete 10th visit PN next visit to determine likelihood for need for recert vs readiness to transition to HEP at end of current POC.   OBJECTIVE IMPAIRMENTS: Abnormal gait, decreased activity tolerance, decreased balance, decreased endurance, decreased knowledge of condition, decreased mobility, difficulty walking, decreased ROM, decreased strength, increased fascial restrictions, impaired perceived functional ability, increased muscle spasms, impaired flexibility, improper body mechanics, postural dysfunction, and pain.   ACTIVITY LIMITATIONS: carrying, lifting, bending, standing, stairs, and locomotion level  PARTICIPATION LIMITATIONS: meal prep,  cleaning, laundry, driving, and community activity  PERSONAL FACTORS: Age, Fitness, Past/current experiences, Time since onset of injury/illness/exacerbation, and 3+ comorbidities: HTN, DM-II, Asthma, Crohn's disease, GERD, IBS, CKD, fatigue, hip pain  are also affecting patient's functional outcome.   REHAB POTENTIAL: Good  CLINICAL DECISION MAKING: Evolving/moderate complexity  EVALUATION COMPLEXITY: Moderate   GOALS: Goals reviewed with patient? Yes  SHORT TERM GOALS: Target date: 12/15/2022   Patient will be independent with initial HEP to improve outcomes and carryover.  Baseline: TBD Goal status: MET  12/15/22  2.  Patient will report centralization of radicular symptoms.  Baseline: Intermittent B LE radicular pain down to ankles and feet with variable patterns Goal status: MET  12/15/22 - no incidents of radicular pain  LONG TERM GOALS: Target date: 01/12/2023  Patient will be independent with ongoing/advanced HEP for self-management at home.  Baseline:  Goal status: IN PROGRESS  12/20/22 - HEP updated  2.  Patient will report 50-75% improvement in low back pain to improve QOL.  Baseline: 2-3/10 on eval, up to 8/10 at worst; B inner thigh pain/cramping "off the charts" when present Goal status: IN PROGRESS  12/22/22 - Pt reports 20-25% improvement  3.  Patient to demonstrate ability to achieve and maintain good spinal alignment/posturing and body mechanics needed for daily activities. Baseline: 12/22/22 - Education provided on posture and body mechanics Goal status: MET  12/29/22  4.  Patient will demonstrate functional pain free lumbar ROM to perform ADLs.   Baseline: Limited and painful lumbar ROM in all planes - refer to above lumbar ROM table Goal status: IN PROGRESS  12/29/22 - met for flexion and L rotation  5.  Patient will demonstrate improved B LE strength to >/= 4 to 4+/5 for improved stability  and ease of mobility . Baseline: Refer to above LE MMT table Goal status:  IN PROGRESS  12/29/22 - met for knees and ankles, continued proximal weakness at hips  6.  Patient will report >/= 55 on lumbar FOTO to demonstrate improved functional ability.  Baseline: 44 Goal status: IN PROGRESS   7. Patient will report </= 16% on Modified Oswestry to demonstrate improved functional ability with decreased pain interference. Baseline: 14 / 50 = 28.0 % Goal status: IN PROGRESS  8.  Patient will tolerate 20-30 min of walking w/o increased pain to allow for improved mobility and activity tolerance so that she can resume walking for exercise. Baseline: Walking limited to <10-15 minutes Goal status: IN PROGRESS   PLAN:  PT FREQUENCY: 2x/week  PT DURATION: 8 weeks  PLANNED INTERVENTIONS: Therapeutic exercises, Therapeutic activity, Neuromuscular re-education, Balance training, Gait training, Patient/Family education, Self Care, Joint mobilization, Stair training, Aquatic Therapy, Dry Needling, Electrical stimulation, Spinal manipulation, Spinal mobilization, Cryotherapy, Moist heat, Taping, Traction, Ultrasound, Manual therapy, and Re-evaluation  PLAN FOR NEXT SESSION: 10th visit PN; Progress core/lumbopelvic flexibility and strengthening with extension-based preference; MT to address abnormal muscle tension in lumbar paraspinals and R>L glutes/piriformis, possibly hip mobs; review posture & body mechanics education as needed.   Marry Guan, PT 01/03/2023, 11:00 AM

## 2023-01-05 ENCOUNTER — Encounter: Payer: Self-pay | Admitting: Physical Therapy

## 2023-01-05 ENCOUNTER — Ambulatory Visit: Payer: PPO | Admitting: Physical Therapy

## 2023-01-05 DIAGNOSIS — R262 Difficulty in walking, not elsewhere classified: Secondary | ICD-10-CM

## 2023-01-05 DIAGNOSIS — M62838 Other muscle spasm: Secondary | ICD-10-CM

## 2023-01-05 DIAGNOSIS — M5416 Radiculopathy, lumbar region: Secondary | ICD-10-CM

## 2023-01-05 DIAGNOSIS — M5459 Other low back pain: Secondary | ICD-10-CM

## 2023-01-05 DIAGNOSIS — M6281 Muscle weakness (generalized): Secondary | ICD-10-CM

## 2023-01-05 NOTE — Therapy (Signed)
OUTPATIENT PHYSICAL THERAPY TREATMENT  Progress Note  Reporting Period 11/17/2022 to 01/05/2023   See note below for Objective Data and Assessment of Progress/Goals.     Patient Name: Kathy [name] MRN: [id number] DOB:11-10-1955, 67 y.o., female Today's Date: 01/05/2023  END OF SESSION:  PT End of Session - 01/05/23 1022     Visit Number 10    Date for PT Re-Evaluation 01/12/23    Progress Note Due on Visit 10    PT Start Time 1022    PT Stop Time 1109    PT Time Calculation (min) 47 min    Activity Tolerance Patient tolerated treatment well    Behavior During Therapy [facility] for tasks assessed/performed                  Past Medical History:  Diagnosis Date   Anal stenosis    Asthma    09-23-2017  per pt has not done inhaler since May 2018, no insurance (QVAR bid and Ventolin prn)   B12 deficiency 01/05/2021   Crohn disease (HCC)    Diastolic dysfunction    per echo 06-23-2009  grade 2 diastolic dysfunction , ef 60-65%   Esophagitis    GERD (gastroesophageal reflux disease)    Hemorrhoids    Hiatal hernia    Hyperlipidemia    stopped meds due to side effects   Hypertension    09-23-2017  per pt has takes bp meds since May 2018 no insurance (losartan 100mg  qd, HCTZ 12.5mg  qd, and toprol 50 qd)   Microalbuminuria 06/01/2006   Qualifier: Diagnosis of  By: Allena Katz MD, Sejal     Type 2 diabetes mellitus (HCC)    09-23-2017 per pt has not taken diabetic meds since May 2018, no insurance (kombiglyze 5-1000mg  qd)   Past Surgical History:  Procedure Laterality Date   BREAST BIOPSY Bilateral    CARDIOVASCULAR STRESS TEST  07/29/2009   normal nuclear study w/ no ischemia/  normal LV function and wall motion,  ef 70%   CESAREAN SECTION  1980s   RECTAL BIOPSY N/A 10/03/2017   Procedure: POSSIBLE BIOPSY RECTAL;  Surgeon: Andria Meuse, MD;  Location: [facility];  Service: General;  Laterality: N/A;   Patient Active Problem List   Diagnosis Date Noted   Abnormal  SPEP 11/06/2022   Fatigue 10/23/2022   Lower extremity edema 10/23/2022   Elevated serum protein level 10/23/2022   Uncontrolled type 2 diabetes mellitus with hyperglycemia (HCC) 07/06/2022   Irritable bowel syndrome with constipation 03/10/2022   Lumbar radiculopathy 10/28/2021   Chronic kidney disease, stage 3b (HCC) 07/09/2021   Controlled type 2 diabetes mellitus without complication, without long-term current use of insulin (HCC) 07/09/2021   Hip pain 07/09/2021   B12 deficiency 01/05/2021   Tinea pedis of both feet 10/03/2020   Healthcare maintenance 05/19/2018   Crohn disease (HCC) 03/09/2018   Diastolic dysfunction 03/09/2018   GERD (gastroesophageal reflux disease) 03/09/2018   Migraine without aura 07/17/2008   Microalbuminuria 06/01/2006   Hyperlipidemia 05/30/2006   Essential hypertension 05/30/2006   Asthma 05/30/2006    PCP: Sandford Craze, NP   REFERRING PROVIDER: Sandford Craze, NP   REFERRING DIAG: M54.16 (ICD-10-CM) - Lumbar radiculopathy   THERAPY DIAG:  Radiculopathy, lumbar region  Other low back pain  Muscle weakness (generalized)  Other muscle spasm  Difficulty in walking, not elsewhere classified  RATIONALE FOR EVALUATION AND TREATMENT: Rehabilitation  ONSET DATE: chronic "on and off"  NEXT MD VISIT: 01/24/23   SUBJECTIVE:  SUBJECTIVE STATEMENT:  Pt reports no pain this morning. In the weeks since she started PT, she states she has only had 3 bad days. She still feels most limited with walking, although admits that she has not really tried to progress this recently, and traveling with need for more frequent rest stops.  EVAL:  Pt reports the complaint that led to her PT referral was leg cramps in her B inner thighs - pain so intense that  it was off the scale and almost caused her to faint. Low back pain has been an issue "on and off" for 2-3 years. Radicular pain varies in pattern and intensity, but does not note any specific numbness or tingling. She states her PCP wants her to be more active but she does not feel that she has the stamina to walk due to weakness - unable to walk for more than 10-15 minutes.  PAIN: Are you having pain? No  PERTINENT HISTORY:  HTN, DM-II, Asthma, Crohn's disease, GERD, IBS, CKD, fatigue, hip pain  PRECAUTIONS: None  RED FLAGS: None  WEIGHT BEARING RESTRICTIONS: No  FALLS:  Has patient fallen in last 6 months? No  LIVING ENVIRONMENT: Lives with: lives alone Lives in: House/apartment Stairs: No Has following equipment at home: Single point cane  OCCUPATION: Retired  PLOF: Independent and Leisure: mostly sedentary other than ADLs  PATIENT GOALS: "I want to be able to increase my activity, esp do more walking."   OBJECTIVE: (objective measures completed at initial evaluation unless otherwise dated)  DIAGNOSTIC FINDINGS:  DG Lumbar spine ordered  PATIENT SURVEYS:  Modified Oswestry 14 / 50 = 28.0 %  FOTO Lumbar - 44; predicted - 55  SCREENING FOR RED FLAGS: Bowel or bladder incontinence: No Spinal tumors: No Cauda equina syndrome: No Compression fracture: No Abdominal aneurysm: No  COGNITION:  Overall cognitive status: Within functional limits for tasks assessed    SENSATION: WFL - pt denies radicular numbness or tingling  MUSCLE LENGTH: Hamstrings: Mild tight B ITB: Mild tight B Piriformis: Mod/severe tight bil Hip flexors: Mild tight B Quads: Mild/mod tight B Heelcord: NT  POSTURE:  No Significant postural limitations  PALPATION: Increased muscle tension and TTP in bilateral lower lumbar paraspinals at lumbosacral junction, as well as R>L glutes and piriformis. Lumbosacral hypomobility also noted.  LUMBAR ROM:   Active  eval 12/29/22  Flexion Hands to  just above ankles ^ WFL - no pain  Extension 30% limited ^ 10% limited - pulling  Right lateral flexion Hand to mid shin ^ Hand to mid shin   Left lateral flexion Hand to fib head ^ Hand to fib head - ^/pulling  Right rotation 25% limited ^ 20% limited ^  Left rotation 25% limited ^ WFL - no pain  (Blank rows = not tested, ^ = increased pain)  LOWER EXTREMITY ROM:    B LE essentially WNL except for limitations in B hip rotation  LOWER EXTREMITY MMT:    MMT Right eval Left eval R 12/29/22 L 12/29/22  Hip flexion 4- 4- 4 4  Hip extension 3 3- 3+ 3+  Hip abduction 4- 3+ 4+ 4-  Hip adduction 3- 3- 3+ 3+  Hip internal rotation 4 4 4+ 4+  Hip external rotation 4 4 4  limited ROM 4 limited ROM  Knee flexion 4+ 4+ 5 5  Knee extension 4+ 4+ 5 5  Ankle dorsiflexion 4+ 4+ 5 5  Ankle plantarflexion      Ankle inversion  Ankle eversion       (Blank rows = not tested)  LUMBAR SPECIAL TESTS:  Straight leg raise test: Negative and Slump test: Negative  FUNCTIONAL TESTS:  Timed up and go (TUG): 15.27 sec 10 meter walk test: 13.15 sec Gait speed: 2.49 ft/sec  GAIT: Distance walked: 80 feet Assistive device utilized: None Level of assistance: Complete Independence Gait pattern: step to pattern, decreased stride length, decreased hip/knee flexion- Right, decreased hip/knee flexion- Left, and wide BOS Comments: Decreased gait speed   TODAY'S TREATMENT:   01/05/23 - 10th visit PN THERAPEUTIC EXERCISE: to improve flexibility, strength and mobility.  Demonstration, verbal and tactile cues throughout for technique.  TM - 1.4 mph x 6 min Standing hip abduction with looped RT B at ankles x 10 bil, UE support on counter for balance Standing hip extension with looped RT B at ankles x 10 bil, UE support on counter for balance Standing hip flexion with looped RT B at ankles x 10 bil, UE support on counter for balance Counter mini-squat x 10  THERAPEUTIC ACTIVITIES: Lumbar FOTO = 62 Modified  Oswestry = 15 / 50 = 30.0 % Goal assessment Discussion of gradual resumption of home walking program with small increment progression   01/03/23 THERAPEUTIC EXERCISE: to improve flexibility, strength and mobility.  Demonstration, verbal and tactile cues throughout for technique.  UBE: L1.0 x 6 min (3' each fwd & back) Quadruped over peanut ball alt unilateral hip extension x 10 bil Quadruped over peanut ball alt unilateral arm raise x 10 bil Quadruped over peanut ball alt bird dog x 10 bil TRX squat x 10 Functional squat + B UE raise 10 x 3" B side-step in slight squat with looped RTB at ankles 2 x 10 ft along counter Standing TrA + GTB scap retraction/row 2 x 10 Standing TrA + GTB scap retraction/B shoulder extension 2 x 10 - cues to keep elbows straight Standing GTB pallof press x 10 bil Standing GTB pallof press + short-arc trunk rotation x 5 - discontinued d/t cramping in her hands   12/29/22 THERAPEUTIC EXERCISE: to improve flexibility, strength and mobility.  Demonstration, verbal and tactile cues throughout for technique.  NuStep - L6 x 6 min (UE/LE)  Prone with pillow under abdomen/hips unilateral hip extension 2 x 5 bil Quadruped cat cow x 8 - limited by UE fatigue Quadruped over peanut ball unilateral hip extension 2 x 5 bil - better tolerated than prone Bridge + RTB hip ABD isometric 10 x 3" S/L clam x 10 bil, looped RTB at knees for R clam only, no resistance on L  THERAPEUTIC ACTIVITIES: Lumbar ROM LE MMT   12/22/22 THERAPEUTIC EXERCISE: to improve flexibility, strength and mobility.  Demonstration, verbal and tactile cues throughout for technique.  NuStep - L6 x 6 min (UE/LE)  Standing hip ABD with looped RTB at ankles x 10 bil Standing hip extension with looped RTB at ankles x 10 bil Standing TrA + RTB scap retraction/row x 10 Standing TrA + RTB scap retraction/B shoulder extension x 10 - cues to keep elbows straight Standing RTB pallof press x 10 bil  THERAPEUTIC  ACTIVITIES: Provided education in proper posture and body mechanics for typical daily positioning and household chores to minimize strain on low back.   PATIENT EDUCATION:  Education details: HEP review and HEP update Person educated: Patient Education method: Explanation, Demonstration, Verbal cues, and Handouts Education comprehension: verbalized understanding, returned demonstration, verbal cues required, and needs further education  HOME EXERCISE PROGRAM:  Access Code: [email or web address] URL: [email or web address] Date: 01/05/2023 Prepared by: Glenetta Hew  Exercises - Supine Lower Trunk Rotation  - 1 x daily - 7 x weekly - 3 sets - 10 reps - Supine Pelvic Tilt  - 1 x daily - 7 x weekly - 3 sets - 10 reps - Hooklying Single Knee to Chest  - 1 x daily - 7 x weekly - 3 sets - 10 reps - Seated Piriformis Stretch  - 1-2 x daily - 7 x weekly - 3 reps - 30 sec hold - Standing Lumbar Extension at Wall - Forearms  - 1 x daily - 7 x weekly - 2 sets - 10 reps - 3 sec hold - Seated Isometric Hip Abduction with Resistance  - 1 x daily - 3-4 x weekly - 2 sets - 10 reps - 3 sec hold - Seated March with Resistance  - 1 x daily - 3-4 x weekly - 2 sets - 10 reps - 3 sec hold - Seated Isometric Hip Adduction with Ball  - 1 x daily - 3-4 x weekly - 2 sets - 10 reps - 3 sec hold - Standing Shoulder Row with Anchored Resistance  - 1 x daily - 3 x weekly - 2 sets - 10 reps - 5 sec hold - Shoulder Extension with Resistance  - 1 x daily - 3 x weekly - 2 sets - 10 reps - 3 sec hold - Standing Anti-Rotation Press with Anchored Resistance  - 1 x daily - 3 x weekly - 2 sets - 10 reps - 3 sec hold - Standing Hip Abduction with Resistance at Ankles and Counter Support  - 1 x daily - 3-4 x weekly - 2 sets - 10 reps - 2-3 sec hold - Standing Hip Extension with Counter Support  - 1 x daily - 3-4 x weekly - 2 sets - 10 reps - 2-3 sec hold - Standing Hip Flexion with Resistance Loop  - 1 x daily - 3-4 x weekly - 2  sets - 10 reps - 2-3 sec hold - Mini Squat with Counter Support  - 1 x daily - 3-4 x weekly - 2 sets - 10 reps - 3-5 sec hold  Patient Education - Posture and Body Mechanics   ASSESSMENT:  CLINICAL IMPRESSION: Deshelia denies pain again today and reports 45% overall improvement in pain, with no recent radicular pain and only intermittent low back pain.  Patient reported outcome measures revealing mixed response to PT with FOTO exceeding predicted value, however modified Oswestry showing decline from baseline.  Further discussion with patient revealing some activities assessed on modified Oswestry are things that she just does not attempt, or options listed do not match her perceived function.  Recent ROM and MMT revealing good progress but continued mild limitations in flexibility as well as proximal LE strength.  Megumi reports she has not really attempted to try her activity level, especially walking, but feels like she would likely be able to do so.  We reviewed her HEP progressing her LE strengthening to address ongoing weakness and discussed gradually resuming walking for exercise, slowly increasing time as tolerance allows.  She is demonstrating good progress with her PT goals with all STG's along with LTG's #3 and 6 met, as well as LTG's #4 and 5 partially met.  Omnia has 1 visit remaining in her current POC, at which time we will determine if she feels ready to try transitioning to her HEP and home walking program +/-  30-day hold vs need for recert.  OBJECTIVE IMPAIRMENTS: Abnormal gait, decreased activity tolerance, decreased balance, decreased endurance, decreased knowledge of condition, decreased mobility, difficulty walking, decreased ROM, decreased strength, increased fascial restrictions, impaired perceived functional ability, increased muscle spasms, impaired flexibility, improper body mechanics, postural dysfunction, and pain.   ACTIVITY LIMITATIONS: carrying, lifting, bending, standing,  stairs, and locomotion level  PARTICIPATION LIMITATIONS: meal prep, cleaning, laundry, driving, and community activity  PERSONAL FACTORS: Age, Fitness, Past/current experiences, Time since onset of injury/illness/exacerbation, and 3+ comorbidities: HTN, DM-II, Asthma, Crohn's disease, GERD, IBS, CKD, fatigue, hip pain  are also affecting patient's functional outcome.   REHAB POTENTIAL: Good  CLINICAL DECISION MAKING: Evolving/moderate complexity  EVALUATION COMPLEXITY: Moderate   GOALS: Goals reviewed with patient? Yes  SHORT TERM GOALS: Target date: 12/15/2022   Patient will be independent with initial HEP to improve outcomes and carryover.  Baseline: TBD Goal status: MET  12/15/22  2.  Patient will report centralization of radicular symptoms.  Baseline: Intermittent B LE radicular pain down to ankles and feet with variable patterns Goal status: MET  12/15/22 - no incidents of radicular pain  LONG TERM GOALS: Target date: 01/12/2023  Patient will be independent with ongoing/advanced HEP for self-management at home.  Baseline:  Goal status: IN PROGRESS  01/05/23 - HEP updated  2.  Patient will report 50-75% improvement in low back pain to improve QOL.  Baseline: 2-3/10 on eval, up to 8/10 at worst; B inner thigh pain/cramping "off the charts" when present Goal status: IN PROGRESS  01/05/23 - Pt reports 45% improvement  3.  Patient to demonstrate ability to achieve and maintain good spinal alignment/posturing and body mechanics needed for daily activities. Baseline: 12/22/22 - Education provided on posture and body mechanics Goal status: MET  12/29/22  4.  Patient will demonstrate functional pain free lumbar ROM to perform ADLs.   Baseline: Limited and painful lumbar ROM in all planes - refer to above lumbar ROM table Goal status:PARTIALLY  MET  12/29/22 - met for flexion and L rotation  5.  Patient will demonstrate improved B LE strength to >/= 4 to 4+/5 for improved stability and  ease of mobility . Baseline: Refer to above LE MMT table Goal status: PARTIALLY MET  12/29/22 - met for knees and ankles, continued proximal weakness at hips  6.  Patient will report >/= 55 on lumbar FOTO to demonstrate improved functional ability.  Baseline: 44 Goal status: MET  01/05/23 - 62  7. Patient will report </= 16% on Modified Oswestry to demonstrate improved functional ability with decreased pain interference. Baseline: 14 / 50 = 28.0 % Goal status: IN PROGRESS  9//11/24 - 15 / 50 = 30.0 %  8.  Patient will tolerate 20-30 min of walking w/o increased pain to allow for improved mobility and activity tolerance so that she can resume walking for exercise. Baseline: Walking limited to <10-15 minutes Goal status: IN PROGRESS  01/05/23 - pt feels like she can probably walk for 20 minutes but has not attempted this yet   PLAN:  PT FREQUENCY: 2x/week  PT DURATION: 8 weeks  PLANNED INTERVENTIONS: Therapeutic exercises, Therapeutic activity, Neuromuscular re-education, Balance training, Gait training, Patient/Family education, Self Care, Joint mobilization, Stair training, Aquatic Therapy, Dry Needling, Electrical stimulation, Spinal manipulation, Spinal mobilization, Cryotherapy, Moist heat, Taping, Traction, Ultrasound, Manual therapy, and Re-evaluation  PLAN FOR NEXT SESSION: Potential transition to HEP +/- 30-day hold vs recert to continue to progress core/lumbopelvic flexibility and strengthening with extension-based preference;  MT to address abnormal muscle tension in lumbar paraspinals and R>L glutes/piriformis, possibly hip mobs; review posture & body mechanics education as needed.   Marry Guan, PT 01/05/2023, 2:03 PM

## 2023-01-11 ENCOUNTER — Encounter: Payer: Self-pay | Admitting: Physical Therapy

## 2023-01-11 ENCOUNTER — Ambulatory Visit: Payer: PPO | Admitting: Physical Therapy

## 2023-01-11 DIAGNOSIS — M5459 Other low back pain: Secondary | ICD-10-CM

## 2023-01-11 DIAGNOSIS — M62838 Other muscle spasm: Secondary | ICD-10-CM

## 2023-01-11 DIAGNOSIS — R262 Difficulty in walking, not elsewhere classified: Secondary | ICD-10-CM

## 2023-01-11 DIAGNOSIS — M5416 Radiculopathy, lumbar region: Secondary | ICD-10-CM | POA: Diagnosis not present

## 2023-01-11 DIAGNOSIS — M6281 Muscle weakness (generalized): Secondary | ICD-10-CM

## 2023-01-11 NOTE — Therapy (Addendum)
OUTPATIENT PHYSICAL THERAPY TREATMENT / DISCHARGE SUMMARY  Progress Note  Reporting Period 11/17/2022 to 01/11/2023   See note below for Objective Data and Assessment of Progress/Goals.     Patient Name: Kathy [name] MRN: [id number] DOB:Nov 04, 1955, 67 y.o., female Today's Date: 01/11/2023  END OF SESSION:  PT End of Session - 01/11/23 1015     Visit Number 11    Date for PT Re-Evaluation 01/12/23    Progress Note Due on Visit 10    PT Start Time 1015    PT Stop Time 1053    PT Time Calculation (min) 38 min    Activity Tolerance Patient tolerated treatment well    Behavior During Therapy [facility] for tasks assessed/performed                  Past Medical History:  Diagnosis Date   Anal stenosis    Asthma    09-23-2017  per pt has not done inhaler since May 2018, no insurance (QVAR bid and Ventolin prn)   B12 deficiency 01/05/2021   Crohn disease (HCC)    Diastolic dysfunction    per echo 06-23-2009  grade 2 diastolic dysfunction , ef 60-65%   Esophagitis    GERD (gastroesophageal reflux disease)    Hemorrhoids    Hiatal hernia    Hyperlipidemia    stopped meds due to side effects   Hypertension    09-23-2017  per pt has takes bp meds since May 2018 no insurance (losartan 100mg  qd, HCTZ 12.5mg  qd, and toprol 50 qd)   Microalbuminuria 06/01/2006   Qualifier: Diagnosis of  By: Allena Katz MD, Sejal     Type 2 diabetes mellitus (HCC)    09-23-2017 per pt has not taken diabetic meds since May 2018, no insurance (kombiglyze 5-1000mg  qd)   Past Surgical History:  Procedure Laterality Date   BREAST BIOPSY Bilateral    CARDIOVASCULAR STRESS TEST  07/29/2009   normal nuclear study w/ no ischemia/  normal LV function and wall motion,  ef 70%   CESAREAN SECTION  1980s   RECTAL BIOPSY N/A 10/03/2017   Procedure: POSSIBLE BIOPSY RECTAL;  Surgeon: Andria Meuse, MD;  Location: [facility];  Service: General;  Laterality: N/A;   Patient Active Problem List   Diagnosis  Date Noted   Abnormal SPEP 11/06/2022   Fatigue 10/23/2022   Lower extremity edema 10/23/2022   Elevated serum protein level 10/23/2022   Uncontrolled type 2 diabetes mellitus with hyperglycemia (HCC) 07/06/2022   Irritable bowel syndrome with constipation 03/10/2022   Lumbar radiculopathy 10/28/2021   Chronic kidney disease, stage 3b (HCC) 07/09/2021   Controlled type 2 diabetes mellitus without complication, without long-term current use of insulin (HCC) 07/09/2021   Hip pain 07/09/2021   B12 deficiency 01/05/2021   Tinea pedis of both feet 10/03/2020   Healthcare maintenance 05/19/2018   Crohn disease (HCC) 03/09/2018   Diastolic dysfunction 03/09/2018   GERD (gastroesophageal reflux disease) 03/09/2018   Migraine without aura 07/17/2008   Microalbuminuria 06/01/2006   Hyperlipidemia 05/30/2006   Essential hypertension 05/30/2006   Asthma 05/30/2006    PCP: Sandford Craze, NP   REFERRING PROVIDER: Sandford Craze, NP   REFERRING DIAG: M54.16 (ICD-10-CM) - Lumbar radiculopathy   THERAPY DIAG:  Radiculopathy, lumbar region  Other low back pain  Muscle weakness (generalized)  Other muscle spasm  Difficulty in walking, not elsewhere classified  RATIONALE FOR EVALUATION AND TREATMENT: Rehabilitation  ONSET DATE: chronic "on and off"  NEXT MD VISIT: 01/24/23  SUBJECTIVE:                                                                                                                                                                                                         SUBJECTIVE STATEMENT:  Pt reports she has been hurting for the past few days - "hurting all over". States she has not done anything different, but states sometimes she will feel like this when her Crohn's flares up or it may be the inclement weather  EVAL:  Pt reports the complaint that led to her PT referral was leg cramps in her B inner thighs - pain so intense that it was off the scale and  almost caused her to faint. Low back pain has been an issue "on and off" for 2-3 years. Radicular pain varies in pattern and intensity, but does not note any specific numbness or tingling. She states her PCP wants her to be more active but she does not feel that she has the stamina to walk due to weakness - unable to walk for more than 10-15 minutes.  PAIN: Are you having pain? Yes: NPRS scale: 4-5/10 Pain location: "all over" - B low back (along waistline/upper buttocks) and wrapping around to L anterior hip/groin Pain description: sore aching Aggravating factors: uncertain Relieving factors: nothing - tried Tylenol (barely made a difference) & heating pad (makes her hot)  PERTINENT HISTORY:  HTN, DM-II, Asthma, Crohn's disease, GERD, IBS, CKD, fatigue, hip pain  PRECAUTIONS: None  RED FLAGS: None  WEIGHT BEARING RESTRICTIONS: No  FALLS:  Has patient fallen in last 6 months? No  LIVING ENVIRONMENT: Lives with: lives alone Lives in: House/apartment Stairs: No Has following equipment at home: Single point cane  OCCUPATION: Retired  PLOF: Independent and Leisure: mostly sedentary other than ADLs  PATIENT GOALS: "I want to be able to increase my activity, esp do more walking."   OBJECTIVE: (objective measures completed at initial evaluation unless otherwise dated)  DIAGNOSTIC FINDINGS:  DG Lumbar spine ordered  PATIENT SURVEYS:  Modified Oswestry 14 / 50 = 28.0 %  FOTO Lumbar - 44; predicted - 55  SCREENING FOR RED FLAGS: Bowel or bladder incontinence: No Spinal tumors: No Cauda equina syndrome: No Compression fracture: No Abdominal aneurysm: No  COGNITION:  Overall cognitive status: Within functional limits for tasks assessed    SENSATION: WFL - pt denies radicular numbness or tingling  MUSCLE LENGTH: Hamstrings: Mild tight B ITB: Mild tight B Piriformis: Mod/severe tight bil Hip flexors: Mild tight B Quads: Mild/mod tight B Heelcord: NT  POSTURE:  No  Significant postural limitations  PALPATION: Increased muscle tension and TTP in bilateral lower lumbar paraspinals at lumbosacral junction, as well as R>L glutes and piriformis. Lumbosacral hypomobility also noted.  LUMBAR ROM:   Active  eval 12/29/22 & 01/11/23  Flexion Hands to just above ankles ^ WFL - no pain  Extension 30% limited ^ 10% limited - pulling  Right lateral flexion Hand to mid shin ^ Hand to mid shin   Left lateral flexion Hand to fib head ^ Hand to fib head - ^/pulling  Right rotation 25% limited ^ 20% limited ^  Left rotation 25% limited ^ WFL - no pain  (Blank rows = not tested, ^ = increased pain)  LOWER EXTREMITY ROM:    B LE essentially WNL except for limitations in B hip rotation  LOWER EXTREMITY MMT:    MMT Right eval Left eval R 12/29/22 L 12/29/22 R 01/11/23 L 01/11/23  Hip flexion 4- 4- 4 4 4+ 4  Hip extension 3 3- 3+ 3+ 4 4  Hip abduction 4- 3+ 4+ 4- 4+ 4  Hip adduction 3- 3- 3+ 3+ 4- 4-  Hip internal rotation 4 4 4+ 4+ 4+ 4+  Hip external rotation 4 4 4  limited ROM 4 limited ROM 4+ limited ROM 4  limited ROM  Knee flexion 4+ 4+ 5 5 5 5   Knee extension 4+ 4+ 5 5 5 5   Ankle dorsiflexion 4+ 4+ 5 5 5 5   Ankle plantarflexion        Ankle inversion        Ankle eversion         (Blank rows = not tested)  LUMBAR SPECIAL TESTS:  Straight leg raise test: Negative and Slump test: Negative  FUNCTIONAL TESTS:  Timed up and go (TUG): 15.27 sec 10 meter walk test: 13.15 sec Gait speed: 2.49 ft/sec  GAIT: Distance walked: 80 feet Assistive device utilized: None Level of assistance: Complete Independence Gait pattern: step to pattern, decreased stride length, decreased hip/knee flexion- Right, decreased hip/knee flexion- Left, and wide BOS Comments: Decreased gait speed   TODAY'S TREATMENT:   01/11/23 THERAPEUTIC EXERCISE: to improve flexibility, strength and mobility.  Demonstration, verbal and tactile cues throughout for technique.  NuStep - L6 x 6 min  (UE/LE)   THERAPEUTIC ACTIVITIES: Lumbar ROM assessment LE MMT assessment Goal assessment  SELF CARE:  Verbal HEP review with clarification of specific exercises targeting remaining weakness as well as recommended frequency of ongoing performance of HEP   01/05/23 - 10th visit PN THERAPEUTIC EXERCISE: to improve flexibility, strength and mobility.  Demonstration, verbal and tactile cues throughout for technique.  TM - 1.4 mph x 6 min Standing hip abduction with looped RT B at ankles x 10 bil, UE support on counter for balance Standing hip extension with looped RT B at ankles x 10 bil, UE support on counter for balance Standing hip flexion with looped RT B at ankles x 10 bil, UE support on counter for balance Counter mini-squat x 10  THERAPEUTIC ACTIVITIES: Lumbar FOTO = 62 Modified Oswestry = 15 / 50 = 30.0 % Goal assessment Discussion of gradual resumption of home walking program with small increment progression   01/03/23 THERAPEUTIC EXERCISE: to improve flexibility, strength and mobility.  Demonstration, verbal and tactile cues throughout for technique.  UBE: L1.0 x 6 min (3' each fwd & back) Quadruped over peanut ball alt unilateral hip extension x 10 bil Quadruped over peanut ball alt unilateral arm raise x 10  bil Quadruped over peanut ball alt bird dog x 10 bil TRX squat x 10 Functional squat + B UE raise 10 x 3" B side-step in slight squat with looped RTB at ankles 2 x 10 ft along counter Standing TrA + GTB scap retraction/row 2 x 10 Standing TrA + GTB scap retraction/B shoulder extension 2 x 10 - cues to keep elbows straight Standing GTB pallof press x 10 bil Standing GTB pallof press + short-arc trunk rotation x 5 - discontinued d/t cramping in her hands   PATIENT EDUCATION:  Education details: HEP review, recommended frequency for ongoing HEP at discharge to prevent loss of gains achieved with PT, and 30-day hold policy Person educated: Patient Education method:  Explanation and Verbal cues Education comprehension: verbalized understanding  HOME EXERCISE PROGRAM: Access Code: [email or web address] URL: [email or web address] Date: 01/11/2023 Prepared by: Glenetta Hew  Exercises - Supine Lower Trunk Rotation  - 1 x daily - 7 x weekly - 3 sets - 10 reps - Supine Pelvic Tilt  - 1 x daily - 7 x weekly - 3 sets - 10 reps - Hooklying Single Knee to Chest  - 1 x daily - 7 x weekly - 3 sets - 10 reps - Seated Piriformis Stretch  - 1-2 x daily - 7 x weekly - 3 reps - 30 sec hold - Standing Lumbar Extension at Wall - Forearms  - 1 x daily - 7 x weekly - 2 sets - 10 reps - 3 sec hold - Seated Isometric Hip Abduction with Resistance  - 1 x daily - 3-4 x weekly - 2 sets - 10 reps - 3 sec hold - Seated March with Resistance  - 1 x daily - 3-4 x weekly - 2 sets - 10 reps - 3 sec hold - Seated Isometric Hip Adduction with Ball  - 1 x daily - 3-4 x weekly - 2 sets - 10 reps - 3 sec hold - Standing Shoulder Row with Anchored Resistance  - 1 x daily - 3 x weekly - 2 sets - 10 reps - 5 sec hold - Shoulder Extension with Resistance  - 1 x daily - 3 x weekly - 2 sets - 10 reps - 3 sec hold - Standing Anti-Rotation Press with Anchored Resistance  - 1 x daily - 3 x weekly - 2 sets - 10 reps - 3 sec hold - Standing Hip Abduction with Resistance at Ankles and Counter Support  - 1 x daily - 3-4 x weekly - 2 sets - 10 reps - 2-3 sec hold - Standing Hip Extension with Resistance at Ankles and Counter Support  - 1 x daily - 3-4 x weekly - 2 sets - 10 reps - 3 sec hold - Standing Hip Flexion with Resistance Loop  - 1 x daily - 3-4 x weekly - 2 sets - 10 reps - 2-3 sec hold - Mini Squat with Counter Support  - 1 x daily - 3-4 x weekly - 2 sets - 10 reps - 3-5 sec hold  Patient Education - Posture and Body Mechanics   ASSESSMENT:  CLINICAL IMPRESSION: Kathy [name] reports some increased low back pain for the first time in a while over the past few days.  She feels like it may be  related to the recent inclement weather as well as a possible flareup of her Crohn's disease.  She was able to resume walking last week prior to the flareup without increased pain at the time but did limit the walking  time to 10-12 minutes intentionally as part of trying to ease back into walking for exercise.  She has continued to demonstrate improvement in her proximal lower extremity strength, with strength goal now met with exception of bilateral hip adduction at 4-/5.  Despite the current flareup, Kathy [name] would like to proceed with the transition to her HEP, therefore completed a final verbal review and clarification of recommended ongoing frequency of HEP with patient verbalizing good understanding.  PT goals now mostly met or partially met and Kathy [name] feels ready to transition to her HEP but would like to remain on hold for 30-days in the event that issues arise that would necessitate a return to PT.   OBJECTIVE IMPAIRMENTS: Abnormal gait, decreased activity tolerance, decreased balance, decreased endurance, decreased knowledge of condition, decreased mobility, difficulty walking, decreased ROM, decreased strength, increased fascial restrictions, impaired perceived functional ability, increased muscle spasms, impaired flexibility, improper body mechanics, postural dysfunction, and pain.   ACTIVITY LIMITATIONS: carrying, lifting, bending, standing, stairs, and locomotion level  PARTICIPATION LIMITATIONS: meal prep, cleaning, laundry, driving, and community activity  PERSONAL FACTORS: Age, Fitness, Past/current experiences, Time since onset of injury/illness/exacerbation, and 3+ comorbidities: HTN, DM-II, Asthma, Crohn's disease, GERD, IBS, CKD, fatigue, hip pain  are also affecting patient's functional outcome.   REHAB POTENTIAL: Good  CLINICAL DECISION MAKING: Evolving/moderate complexity  EVALUATION COMPLEXITY: Moderate   GOALS: Goals reviewed with patient? Yes  SHORT TERM GOALS: Target date:  12/15/2022   Patient will be independent with initial HEP to improve outcomes and carryover.  Baseline: TBD Goal status: MET  12/15/22  2.  Patient will report centralization of radicular symptoms.  Baseline: Intermittent B LE radicular pain down to ankles and feet with variable patterns Goal status: MET  12/15/22 - no incidents of radicular pain  LONG TERM GOALS: Target date: 01/12/2023  Patient will be independent with ongoing/advanced HEP for self-management at home.  Baseline:  Goal status: MET  01/11/23  2.  Patient will report 50-75% improvement in low back pain to improve QOL.  Baseline: 2-3/10 on eval, up to 8/10 at worst; B inner thigh pain/cramping "off the charts" when present Goal status: PARTIALLY MET  01/05/23 - Pt reports up to 45% improvement  3.  Patient to demonstrate ability to achieve and maintain good spinal alignment/posturing and body mechanics needed for daily activities. Baseline: 12/22/22 - Education provided on posture and body mechanics Goal status: MET  12/29/22  4.  Patient will demonstrate functional pain free lumbar ROM to perform ADLs.   Baseline: Limited and painful lumbar ROM in all planes - refer to above lumbar ROM table Goal status: PARTIALLY  MET  01/11/23 - met for flexion and L rotation  5.  Patient will demonstrate improved B LE strength to >/= 4 to 4+/5 for improved stability and ease of mobility . Baseline: Refer to above LE MMT table Goal status: PARTIALLY MET  01/11/23 - met for knees and ankles, partially met at hips with continued weakness in B hip adduction at 4-/5  6.  Patient will report >/= 55 on lumbar FOTO to demonstrate improved functional ability.  Baseline: 44 Goal status: MET  01/05/23 - 62  7. Patient will report </= 16% on Modified Oswestry to demonstrate improved functional ability with decreased pain interference. Baseline: 14 / 50 = 28.0 % Goal status: NOT MET  9//11/24 - 15 / 50 = 30.0 %  8.  Patient will tolerate 20-30 min  of walking w/o increased pain to allow for improved  mobility and activity tolerance so that she can resume walking for exercise. Baseline: Walking limited to <10-15 minutes Goal status: NOT MET  01/11/23 - pt able to walk 10-12 minutes last week w/o increased pain prior to current flare-up (did not try further at the time as she was trying not to overdo it and then things started to flare-up but she does not think it was related to the walking)   PLAN:  PT FREQUENCY: 2x/week  PT DURATION: 8 weeks  PLANNED INTERVENTIONS: Therapeutic exercises, Therapeutic activity, Neuromuscular re-education, Balance training, Gait training, Patient/Family education, Self Care, Joint mobilization, Stair training, Aquatic Therapy, Dry Needling, Electrical stimulation, Spinal manipulation, Spinal mobilization, Cryotherapy, Moist heat, Taping, Traction, Ultrasound, Manual therapy, and Re-evaluation  PLAN FOR NEXT SESSION: transition to HEP + 30-day hold    Marry Guan, PT 01/11/2023, 11:08 AM   PHYSICAL THERAPY DISCHARGE SUMMARY  Visits from Start of Care: 11  Current functional level related to goals / functional outcomes: Refer to above clinical impression and goal assessment for status as of last visit on 01/11/2023. Patient was placed on hold for 30 days and has not needed to return to PT, therefore will proceed with discharge from PT for this episode.     Remaining deficits: As above.  Patient experiencing slight flareup as of last PT visit, however was still wanting to proceed with transition to HEP.   Education / Equipment: HEP, Hospital doctor education  Patient agrees to discharge. Patient goals were partially met. Patient is being discharged due to being pleased with the current functional level.  Marry Guan, PT 02/24/23, 10:52 AM  [facility] [address]  [address] [place], [place], [address] Phone: [phone]    Fax:  [phone]

## 2023-01-13 ENCOUNTER — Ambulatory Visit (INDEPENDENT_AMBULATORY_CARE_PROVIDER_SITE_OTHER): Payer: PPO

## 2023-01-13 VITALS — BP 110/68 | HR 54 | Temp 97.9°F | Resp 18 | Ht <= 58 in | Wt 209.2 lb

## 2023-01-13 DIAGNOSIS — K50111 Crohn's disease of large intestine with rectal bleeding: Secondary | ICD-10-CM

## 2023-01-13 MED ORDER — ACETAMINOPHEN 325 MG PO TABS
650.0000 mg | ORAL_TABLET | Freq: Once | ORAL | Status: AC
  Administered 2023-01-13: 650 mg via ORAL
  Filled 2023-01-13: qty 2

## 2023-01-13 MED ORDER — SODIUM CHLORIDE 0.9 % IV SOLN
10.0000 mg/kg | Freq: Once | INTRAVENOUS | Status: AC
  Administered 2023-01-13: 1000 mg via INTRAVENOUS
  Filled 2023-01-13: qty 100

## 2023-01-13 MED ORDER — DIPHENHYDRAMINE HCL 25 MG PO CAPS
25.0000 mg | ORAL_CAPSULE | Freq: Once | ORAL | Status: AC
  Administered 2023-01-13: 25 mg via ORAL
  Filled 2023-01-13: qty 1

## 2023-01-13 NOTE — Progress Notes (Signed)
Diagnosis: Crohn's Disease  Provider:  Chilton Greathouse MD  Procedure: IV Infusion  IV Type: Peripheral, IV Location: L Antecubital  Remicade (Infliximab), Dose: 1000 mg  Infusion Start Time: 1117  Infusion Stop Time: 1335  Post Infusion IV Care: Peripheral IV Discontinued  Discharge: Condition: Good, Destination: Home . AVS Declined  Performed by:  Rico Ala, LPN

## 2023-01-14 ENCOUNTER — Telehealth: Payer: Self-pay | Admitting: Pharmacy Technician

## 2023-01-14 NOTE — Telephone Encounter (Signed)
Auth Submission: APPROVED Site of care: Site of care: [facility] Payer: [employer] ADVT Medication & CPT/J Code(s) submitted: Remicade (Infliximab) J1745 Route of submission (phone, fax, portal):  Phone # [phone] opt 3 Fax # Auth type: Buy/Bill PB Units/visits requested: 10mg /kg - 1000mg  [id number] Reference number: [id number] Approval from: 01/13/23 to 04/03/23     Remicade PAP: Approved 05/06/21 - 04/26/23

## 2023-01-22 ENCOUNTER — Other Ambulatory Visit: Payer: Self-pay | Admitting: Family

## 2023-01-22 DIAGNOSIS — E119 Type 2 diabetes mellitus without complications: Secondary | ICD-10-CM

## 2023-01-24 ENCOUNTER — Ambulatory Visit (INDEPENDENT_AMBULATORY_CARE_PROVIDER_SITE_OTHER): Payer: PPO | Admitting: Family

## 2023-01-24 ENCOUNTER — Other Ambulatory Visit (HOSPITAL_BASED_OUTPATIENT_CLINIC_OR_DEPARTMENT_OTHER): Payer: Self-pay

## 2023-01-24 ENCOUNTER — Telehealth: Payer: Self-pay | Admitting: Family

## 2023-01-24 VITALS — BP 137/55 | HR 63 | Temp 97.8°F | Resp 16 | Wt 210.0 lb

## 2023-01-24 DIAGNOSIS — I10 Essential (primary) hypertension: Secondary | ICD-10-CM

## 2023-01-24 DIAGNOSIS — M5416 Radiculopathy, lumbar region: Secondary | ICD-10-CM | POA: Diagnosis not present

## 2023-01-24 DIAGNOSIS — E7800 Pure hypercholesterolemia, unspecified: Secondary | ICD-10-CM | POA: Diagnosis not present

## 2023-01-24 DIAGNOSIS — E1165 Type 2 diabetes mellitus with hyperglycemia: Secondary | ICD-10-CM | POA: Diagnosis not present

## 2023-01-24 DIAGNOSIS — J45909 Unspecified asthma, uncomplicated: Secondary | ICD-10-CM

## 2023-01-24 DIAGNOSIS — E119 Type 2 diabetes mellitus without complications: Secondary | ICD-10-CM

## 2023-01-24 DIAGNOSIS — Z7984 Long term (current) use of oral hypoglycemic drugs: Secondary | ICD-10-CM | POA: Diagnosis not present

## 2023-01-24 DIAGNOSIS — R350 Frequency of micturition: Secondary | ICD-10-CM | POA: Diagnosis not present

## 2023-01-24 DIAGNOSIS — R778 Other specified abnormalities of plasma proteins: Secondary | ICD-10-CM

## 2023-01-24 DIAGNOSIS — K50111 Crohn's disease of large intestine with rectal bleeding: Secondary | ICD-10-CM

## 2023-01-24 DIAGNOSIS — G43009 Migraine without aura, not intractable, without status migrainosus: Secondary | ICD-10-CM | POA: Diagnosis not present

## 2023-01-24 DIAGNOSIS — E538 Deficiency of other specified B group vitamins: Secondary | ICD-10-CM | POA: Diagnosis not present

## 2023-01-24 DIAGNOSIS — R600 Localized edema: Secondary | ICD-10-CM

## 2023-01-24 LAB — VITAMIN B12: Vitamin B-12: 272 pg/mL (ref 211–911)

## 2023-01-24 LAB — URINALYSIS, ROUTINE W REFLEX MICROSCOPIC
Bilirubin Urine: NEGATIVE
Ketones, ur: NEGATIVE
Nitrite: NEGATIVE
Specific Gravity, Urine: 1.02 (ref 1.000–1.030)
Total Protein, Urine: NEGATIVE
Urine Glucose: NEGATIVE
Urobilinogen, UA: 1 (ref 0.0–1.0)
pH: 6 (ref 5.0–8.0)

## 2023-01-24 LAB — BASIC METABOLIC PANEL
BUN: 18 mg/dL (ref 6–23)
CO2: 30 meq/L (ref 19–32)
Calcium: 9.9 mg/dL (ref 8.4–10.5)
Chloride: 100 meq/L (ref 96–112)
Creatinine, Ser: 1.41 mg/dL — ABNORMAL HIGH (ref 0.40–1.20)
GFR: 38.06 mL/min — ABNORMAL LOW (ref >60.00)
Glucose, Bld: 182 mg/dL — ABNORMAL HIGH (ref 70–99)
Potassium: 4.6 meq/L (ref 3.5–5.1)
Sodium: 140 meq/L (ref 135–145)

## 2023-01-24 LAB — LIPID PANEL
Cholesterol: 125 mg/dL (ref 0–200)
HDL: 44.1 mg/dL (ref >39.00)
LDL Cholesterol: 60 mg/dL (ref 0–99)
NonHDL: 81.18
Total CHOL/HDL Ratio: 3
Triglycerides: 104 mg/dL (ref 0.0–149.0)
VLDL: 20.8 mg/dL (ref 0.0–40.0)

## 2023-01-24 LAB — HEMOGLOBIN A1C: Hgb A1c MFr Bld: 6.8 % — ABNORMAL HIGH (ref 4.6–6.5)

## 2023-01-24 MED ORDER — FREESTYLE LIBRE 3 SENSOR MISC
1.0000 | 5 refills | Status: DC
  Filled 2023-01-24: qty 2, 28d supply, fill #0
  Filled 2023-02-20: qty 2, 28d supply, fill #1

## 2023-01-24 MED ORDER — CEPHALEXIN 500 MG PO CAPS: 500.0000 mg | ORAL_CAPSULE | Freq: Two times a day (BID) | ORAL | 0 refills | Status: DC

## 2023-01-24 MED ORDER — FREESTYLE LIBRE 3 READER DEVI
0 refills | Status: AC
  Filled 2023-01-24: qty 1, fill #0
  Filled 2023-01-24: qty 1, 1d supply, fill #0

## 2023-01-24 NOTE — Assessment & Plan Note (Signed)
Will send rx for [facility] reader. She is not taking actos- just  glipizide and [place].

## 2023-01-24 NOTE — Assessment & Plan Note (Addendum)
Lab Results  Component Value Date   HGBA1C 6.9 (H) 10/22/2022   HGBA1C 6.6 (H) 07/06/2022   HGBA1C 7.0 (H) 03/10/2022   Lab Results  Component Value Date   MICROALBUR 9.5 (H) 07/06/2022   LDLCALC 69 10/28/2021   CREATININE 1.62 (H) 11/24/2022   Maintained on januvia and glipizide.  Will send rx for [facility] reader. She is not taking actos- just  glipizide and [place].

## 2023-01-24 NOTE — Assessment & Plan Note (Addendum)
  Blood pressure well-controlled on current regimen of Amlodipine, Hydrochlorothiazide, Metoprolol, and Balsartan. -Continue current medications.

## 2023-01-24 NOTE — Assessment & Plan Note (Signed)
Reports edema is about the same.

## 2023-01-24 NOTE — Assessment & Plan Note (Signed)
Lab Results  Component Value Date   CHOL 127 10/28/2021   HDL 35.80 (L) 10/28/2021   LDLCALC 69 10/28/2021   TRIG 112.0 10/28/2021   CHOLHDL 4 10/28/2021   Maintained on simvastatin, update lipid panel.

## 2023-01-24 NOTE — Telephone Encounter (Signed)
Sugar is at goal, Kidney function is stable. Urinalysis suggest UTI, please begin keflex bid x 5 days.

## 2023-01-24 NOTE — Assessment & Plan Note (Signed)
This is being monitored by Hematology- Dr. [name].

## 2023-01-24 NOTE — Progress Notes (Signed)
Subjective:     Patient ID: Kathy [name], female    DOB: [birth year], 67 y.o.   MRN: [id number]  Chief Complaint  Patient presents with   Hypertension    Here for follow up   Diabetes    Here for follow up     Discussed the use of AI scribe software for clinical note transcription with the patient, who gave verbal consent to proceed.  History of Present Illness   The patient, with a history of hypertension, hyperlipidemia, migraines, asthma, Crohn's disease, and diabetes, presents for a routine follow-up. She reports resolution of previous back pain after physical therapy. She reports leg swelling is about the same. She denies recent migraines and asthma exacerbations, using albuterol only as needed. She reports Crohn's disease flares from time to time but finds relief with Remicade every six weeks. She continues to follow with GI for her Crohn's. She expresses concern about the number of antihypertensive medications she is taking, but understands the necessity given her blood pressure readings.  The patient's primary complaint today is frequent urination, particularly at night, and difficulty sleeping. She reports urgency during the day and waking every two hours at night to urinate. She also mentions a lack of a reader for her Freestyle Libre monitor for diabetes management. She is currently taking Januvia and glipizide for diabetes.           Health Maintenance Due  Topic Date Due   OPHTHALMOLOGY EXAM  11/27/2022   COVID-19 Vaccine (5 - 2023-24 season) 12/26/2022    Past Medical History:  Diagnosis Date   Anal stenosis    Asthma    09-23-2017  per pt has not done inhaler since May 2018, no insurance (QVAR bid and Ventolin prn)   B12 deficiency 01/05/2021   Crohn disease (HCC)    Diastolic dysfunction    per echo 06-23-2009  grade 2 diastolic dysfunction , ef 60-65%   Esophagitis    GERD (gastroesophageal reflux disease)    Hemorrhoids    Hiatal hernia     Hyperlipidemia    stopped meds due to side effects   Hypertension    09-23-2017  per pt has takes bp meds since May 2018 no insurance (losartan 100mg  qd, HCTZ 12.5mg  qd, and toprol 50 qd)   Microalbuminuria 06/01/2006   Qualifier: Diagnosis of  By: Allena Katz MD, Sejal     Type 2 diabetes mellitus (HCC)    09-23-2017 per pt has not taken diabetic meds since May 2018, no insurance (kombiglyze 5-1000mg  qd)    Past Surgical History:  Procedure Laterality Date   BREAST BIOPSY Bilateral    CARDIOVASCULAR STRESS TEST  07/29/2009   normal nuclear study w/ no ischemia/  normal LV function and wall motion,  ef 70%   CESAREAN SECTION  1980s   RECTAL BIOPSY N/A 10/03/2017   Procedure: POSSIBLE BIOPSY RECTAL;  Surgeon: Andria Meuse, MD;  Location: [facility];  Service: General;  Laterality: N/A;    Family History  Problem Relation Age of Onset   Hypertension Mother 55   Stroke Mother 72   Diabetes Maternal Aunt        s/p bilater amputation due to DM   Asthma Father    Colon cancer Neg Hx    Esophageal cancer Neg Hx    Stomach cancer Neg Hx    Rectal cancer Neg Hx     Social History   Socioeconomic History   Marital status: Married    Spouse name:  Not on file   Number of children: 2   Years of education: Not on file   Highest education level: Not on file  Occupational History   Occupation: unemployed  Tobacco Use   Smoking status: Never   Smokeless tobacco: Never  Vaping Use   Vaping status: Never Used  Substance and Sexual Activity   Alcohol use: No   Drug use: No   Sexual activity: Not Currently  Other Topics Concern   Not on file  Social History Narrative   Not working currently. Retired in 2019. Previously worked in [employer] job   Married    2 children (son and daughter). They are both local   She has 1 grand-daughter and 1 grand-son   No pets (allergic)   Completed 4 years of college.   Social Determinants of Health   Financial Resource Strain: Low Risk   (08/13/2022)   Overall Financial Resource Strain (CARDIA)    Difficulty of Paying Living Expenses: Not hard at all  Food Insecurity: No Food Insecurity (11/24/2022)   Hunger Vital Sign    Worried About Running Out of Food in the Last Year: Never true    Ran Out of Food in the Last Year: Never true  Transportation Needs: No Transportation Needs (11/24/2022)   Kathy [name], Kathy Service (Medical): No    Lack of Transportation (Non-Medical): No  Physical Activity: Inactive (08/13/2022)   Exercise Vital Sign    Days of Exercise per Week: 0 days    Minutes of Exercise per Session: 0 min  Stress: No Stress Concern Present (08/13/2022)   Kathy [name]    Feeling of Stress : Only a little  Social Connections: Moderately Integrated (08/13/2022)   Social Connection and Isolation Panel [NHANES]    Frequency of Communication with Friends and Family: More than three times a week    Frequency of Social Gatherings with Friends and Family: Twice a week    Attends Religious Services: More than 4 times per year    Active Member of Kathy [name]: No    Attends Banker Meetings: Never    Marital Status: Married  Catering manager Violence: Not At Risk (11/24/2022)   Humiliation, Afraid, Rape, and Kick [name]    Fear of Current or Ex-Partner: No    Emotionally Abused: No    Physically Abused: No    Sexually Abused: No    Outpatient Medications Prior to Visit  Medication Sig Dispense Refill   albuterol (VENTOLIN HFA) 108 (90 Base) MCG/ACT inhaler Inhale 1 puff into the lungs every 6 (six) hours as needed for wheezing or shortness of breath. 18 g 5   amLODipine (NORVASC) 10 MG tablet Take 1 tablet by mouth once daily 90 tablet 0   azaTHIOprine (IMURAN) 50 MG tablet Take 2 tablets by mouth once daily 60 tablet 3   clotrimazole-betamethasone (LOTRISONE) cream Apply 1 application topically 2 (two)  times daily. 30 g 1   colchicine 0.6 MG tablet Take 2 tabs by mouth now and 1 tab in 1 hour. 6 tablet 1   Continuous Blood Gluc Sensor (FREESTYLE LIBRE 14 DAY SENSOR) MISC Change every 14 days. 6 each 5   COVID-19 mRNA vaccine 2023-2024 (COMIRNATY) syringe Inject into the muscle. 0.3 mL 0   dicyclomine (BENTYL) 10 MG capsule TAKE 1 CAPSULE BY MOUTH THREE TIMES DAILY AS NEEDED FOR  CRAMPING 90 capsule 2   glipiZIDE (GLUCOTROL  XL) 10 MG 24 hr tablet Take 1 tablet by mouth once daily with breakfast 90 tablet 0   hydrochlorothiazide (MICROZIDE) 12.5 MG capsule Take 1 capsule (12.5 mg total) by mouth daily. 90 capsule 0   hydrocortisone (ANUSOL-HC) 25 MG suppository Place 1 suppository (25 mg total) rectally at bedtime. 14 suppository 0   Iron, Ferrous Sulfate, 325 (65 Fe) MG TABS Take 325 mg by mouth every other day.     lubiprostone (AMITIZA) 8 MCG capsule TAKE 1 CAPSULE BY MOUTH TWICE DAILY WITH A MEAL . KEEP UPCOMING APPOINTMENT FOR FUTURE REFILLS 60 capsule 3   metoprolol succinate (TOPROL-XL) 50 MG 24 hr tablet Take 1 tablet (50 mg total) by mouth daily. Take with or immediately following a meal 90 tablet 1   Multiple Vitamins-Minerals (MULTIVITAMIN WITH MINERALS) tablet Take 1 tablet by mouth daily.     omeprazole (PRILOSEC) 40 MG capsule Take 1 capsule by mouth once daily 90 capsule 0   polyethylene glycol (MIRALAX / GLYCOLAX) 17 g packet Take 17 g by mouth as needed. 1 capful prn 30 each 11   REMICADE 100 MG injection Inject 1,000 mg into the vein every 6 (six) weeks.     simvastatin (ZOCOR) 10 MG tablet Take 1 tablet by mouth once daily 90 tablet 0   sitaGLIPtin (JANUVIA) 50 MG tablet Take 1 tablet (50 mg total) by mouth daily. 90 tablet 1   valsartan (DIOVAN) 40 MG tablet Take 1 tablet by mouth once daily 90 tablet 0   vitamin B-12 (CYANOCOBALAMIN) 1000 MCG tablet Take 1 tablet (1,000 mcg total) by mouth daily. 1 tablet 0   Continuous Blood Gluc Receiver (FREESTYLE LIBRE 14 DAY READER) DEVI  Use as directed 1 each 0   pioglitazone (ACTOS) 30 MG tablet Take 1 tablet (30 mg total) by mouth daily. 90 tablet 1   No facility-administered medications prior to visit.    Allergies  Allergen Reactions   Metformin Nausea Only   Lipitor [Atorvastatin Calcium]     Leg cramps    Lisinopril Itching   Pravachol [Pravastatin Sodium]    Pravastatin Sodium Other (See Comments)    leg cramps    ROS    See HPI Objective:    Physical Exam Constitutional:      General: She is not in acute distress.    Appearance: Normal appearance. She is well-developed.  HENT:     Head: Normocephalic and atraumatic.     Right Ear: External ear normal.     Left Ear: External ear normal.  Eyes:     General: No scleral icterus. Neck:     Thyroid: No thyromegaly.  Cardiovascular:     Rate and Rhythm: Normal rate and regular rhythm.     Heart sounds: Normal heart sounds. No murmur heard. Pulmonary:     Effort: Pulmonary effort is normal. No respiratory distress.     Breath sounds: Normal breath sounds. No wheezing.  Musculoskeletal:        General: Swelling (bilateral LE edema 3-4+) present.     Cervical back: Neck supple.  Skin:    General: Skin is warm and dry.  Neurological:     Mental Status: She is alert and oriented to person, place, and time.  Psychiatric:        Mood and Affect: Mood normal.        Behavior: Behavior normal.        Thought Content: Thought content normal.  Judgment: Judgment normal.      BP (!) 137/55 (BP Location: Right Arm, Patient Position: Sitting, Cuff Size: Large)   Pulse 63   Temp 97.8 F (36.6 C) (Oral)   Resp 16   Wt 210 lb (95.3 kg)   SpO2 100%   BMI 43.89 kg/m  Wt Readings from Last 3 Encounters:  01/24/23 210 lb (95.3 kg)  01/13/23 209 lb 3.2 oz (94.9 kg)  12/02/22 208 lb 9.6 oz (94.6 kg)       Assessment & Plan:   Problem List Items Addressed This Visit       Unprioritized   Hyperlipidemia (Chronic)    Lab Results   Component Value Date   CHOL 127 10/28/2021   HDL 35.80 (L) 10/28/2021   LDLCALC 69 10/28/2021   TRIG 112.0 10/28/2021   CHOLHDL 4 10/28/2021   Maintained on simvastatin, update lipid panel.       Relevant Orders   Lipid panel   Essential hypertension (Chronic)     Blood pressure well-controlled on current regimen of Amlodipine, Hydrochlorothiazide, Metoprolol, and Balsartan. -Continue current medications.      Relevant Orders   Basic Metabolic Panel (BMET)   Uncontrolled type 2 diabetes mellitus with hyperglycemia (HCC)    Lab Results  Component Value Date   HGBA1C 6.9 (H) 10/22/2022   HGBA1C 6.6 (H) 07/06/2022   HGBA1C 7.0 (H) 03/10/2022   Lab Results  Component Value Date   MICROALBUR 9.5 (H) 07/06/2022   LDLCALC 69 10/28/2021   CREATININE 1.62 (H) 11/24/2022   Maintained on januvia and glipizide.  Will send rx for [facility] reader. She is not taking actos- just  glipizide and [place].       Relevant Medications   Continuous Glucose Receiver (FREESTYLE LIBRE 14 DAY READER) DEVI   Other Relevant Orders   HgB A1c   Migraine without aura    No recent migraines, monitor.       Lumbar radiculopathy - Primary    Resolve following physical therapy.  Monitor.       Lower extremity edema    Reports edema is about the same.       FREQUENCY, URINARY     Patient reports difficulty sleeping due to frequent urination at night and urgency during the day. -Check urine for infection. -Check A1C to rule out hyperglycemia. -Consider overactive bladder if above tests are negative.      Relevant Orders   Urine Culture   Urinalysis, Routine w reflex microscopic   Crohn disease (HCC)    Continues Remicade every 6 weeks which is prescribed by GI.  Notes improvement on Remicade.       Controlled type 2 diabetes mellitus without complication, without long-term current use of insulin (HCC)    Will send rx for [facility] reader. She is not taking actos- just   glipizide and [place].       B12 deficiency   Relevant Orders   B12   Asthma    Stable with rare use of albuterol.       Abnormal SPEP    This is being monitored by Hematology- Dr. [name].       Plans to get flu shot at her pharmacy and Covid booster at her pharmacy.  I have discontinued Averil Smith-Milliken's pioglitazone. I have also changed her FreeStyle Libre 14 Day Reader. Additionally, I am having her maintain her polyethylene glycol, clotrimazole-betamethasone, hydrocortisone, multivitamin with minerals, colchicine, cyanocobalamin, Comirnaty, albuterol, FreeStyle Libre 14 Day Sensor,  Remicade, dicyclomine, metoprolol succinate, sitaGLIPtin, Iron (Ferrous Sulfate), azaTHIOprine, omeprazole, amLODipine, simvastatin, valsartan, lubiprostone, hydrochlorothiazide, and glipiZIDE.  Meds ordered this encounter  Medications   Continuous Glucose Receiver (FREESTYLE LIBRE 14 DAY READER) DEVI    Sig: Use as directed    Dispense:  1 each    Refill:  0    Order Specific Question:   Supervising Provider    Answer:   Danise Edge A [[id number]]

## 2023-01-24 NOTE — Patient Instructions (Signed)
VISIT SUMMARY:  During your recent visit, we discussed your ongoing health conditions including hypertension, hyperlipidemia, migraines, asthma, Crohn's disease, and diabetes. You reported that your back pain and leg swelling have improved with physical therapy. You also mentioned that you have been experiencing frequent urination, especially at night, and difficulty sleeping. We discussed your current medications and you expressed concern about the number of antihypertensive medications you are taking.  YOUR PLAN:  -LOWER BACK PAIN: Your back pain has improved with physical therapy. We will continue with the current management plan.  -HYPERLIPIDEMIA: Hyperlipidemia is a condition where there are high levels of fats in your blood. We will check your cholesterol levels today to ensure your condition is well-controlled with Simvastatin.  -HYPERTENSION: Hypertension is high blood pressure. Your blood pressure is well-controlled on your current medications. We will continue with these medications.  -ASTHMA: You have not had recent asthma attacks and your use of Albuterol is infrequent. We will continue with the current management plan.  -CROHN'S DISEASE: Crohn's disease is a type of inflammatory bowel disease. You have occasional flares but find relief with Remicade every six weeks. We will continue with this treatment.  -INSOMNIA AND NOCTURIA: You have been having difficulty sleeping due to frequent urination at night. We will check your urine for infection and your A1C to rule out high blood sugar. If these tests are negative, we may consider overactive bladder as a possible cause.  -DIABETES MELLITUS: Diabetes Mellitus is a condition where your blood sugar levels are too high. You are currently taking Januvia and Glipizide for this. We will check your A1C levels today and clarify your medication list.  -GENERAL HEALTH MAINTENANCE: We encourage you to get your COVID booster and flu shot. We will  also discuss the importance of a pneumonia shot booster at your next visit. We have scheduled a three-month follow-up appointment.  INSTRUCTIONS:  Please ensure to get your COVID booster and flu shot. We will also discuss the importance of a pneumonia shot booster at your next visit. We have scheduled a three-month follow-up appointment. We will check your cholesterol levels, urine for infection, and A1C levels today. Please continue with your current medications and management plans.

## 2023-01-24 NOTE — Assessment & Plan Note (Signed)
Continues Remicade every 6 weeks which is prescribed by GI.  Notes improvement on Remicade.

## 2023-01-24 NOTE — Assessment & Plan Note (Signed)
Stable with rare use of albuterol.  

## 2023-01-24 NOTE — Assessment & Plan Note (Signed)
Resolve following physical therapy.  Monitor.

## 2023-01-24 NOTE — Assessment & Plan Note (Signed)
  Patient reports difficulty sleeping due to frequent urination at night and urgency during the day. -Check urine for infection. -Check A1C to rule out hyperglycemia. -Consider overactive bladder if above tests are negative.

## 2023-01-24 NOTE — Assessment & Plan Note (Signed)
No recent migraines, monitor.

## 2023-01-25 NOTE — Telephone Encounter (Signed)
Patient notified of results an rx for antibiotics.

## 2023-01-26 ENCOUNTER — Other Ambulatory Visit (HOSPITAL_BASED_OUTPATIENT_CLINIC_OR_DEPARTMENT_OTHER): Payer: Self-pay

## 2023-01-26 LAB — URINE CULTURE
MICRO NUMBER:: 15530144
SPECIMEN QUALITY:: ADEQUATE

## 2023-01-26 MED ORDER — FLUAD 0.5 ML IM SUSY
PREFILLED_SYRINGE | INTRAMUSCULAR | 0 refills | Status: DC
  Filled 2023-01-26: qty 0.5, 1d supply, fill #0

## 2023-02-09 ENCOUNTER — Encounter: Payer: Self-pay | Admitting: Internal Medicine

## 2023-02-24 ENCOUNTER — Ambulatory Visit: Payer: PPO

## 2023-02-24 VITALS — BP 150/85 | HR 66 | Temp 97.8°F | Resp 20 | Ht 59.0 in | Wt 211.4 lb

## 2023-02-24 DIAGNOSIS — K50111 Crohn's disease of large intestine with rectal bleeding: Secondary | ICD-10-CM | POA: Diagnosis not present

## 2023-02-24 MED ORDER — INFLIXIMAB 100 MG IV SOLR
10.0000 mg/kg | Freq: Once | INTRAVENOUS | Status: AC
  Administered 2023-02-24: 1000 mg via INTRAVENOUS
  Filled 2023-02-24: qty 100

## 2023-02-24 MED ORDER — DIPHENHYDRAMINE HCL 25 MG PO CAPS
25.0000 mg | ORAL_CAPSULE | Freq: Once | ORAL | Status: AC
  Administered 2023-02-24: 25 mg via ORAL
  Filled 2023-02-24: qty 1

## 2023-02-24 MED ORDER — ACETAMINOPHEN 325 MG PO TABS
650.0000 mg | ORAL_TABLET | Freq: Once | ORAL | Status: AC
  Administered 2023-02-24: 650 mg via ORAL
  Filled 2023-02-24: qty 2

## 2023-02-24 NOTE — Progress Notes (Signed)
Diagnosis: Crohn's Disease  Provider:  Chilton Greathouse MD  Procedure: IV Infusion  IV Type: Peripheral, IV Location: L Antecubital  Remicade (Infliximab), Dose: 1000 mg  Infusion Start Time: 1109  Infusion Stop Time: 1351  Post Infusion IV Care: Peripheral IV Discontinued  Discharge: Condition: Good, Destination: Home . AVS Declined  Performed by:  Nat Math, RN

## 2023-03-01 ENCOUNTER — Other Ambulatory Visit (HOSPITAL_BASED_OUTPATIENT_CLINIC_OR_DEPARTMENT_OTHER): Payer: Self-pay

## 2023-03-01 ENCOUNTER — Encounter: Payer: Self-pay | Admitting: Internal Medicine

## 2023-03-01 MED ORDER — COMIRNATY 30 MCG/0.3ML IM SUSY
0.3000 mL | PREFILLED_SYRINGE | Freq: Once | INTRAMUSCULAR | 0 refills | Status: AC
  Filled 2023-03-01: qty 0.3, 1d supply, fill #0

## 2023-03-11 ENCOUNTER — Other Ambulatory Visit: Payer: Self-pay | Admitting: Internal Medicine

## 2023-03-13 IMAGING — MG MM DIGITAL SCREENING BILAT W/ TOMO AND CAD
8 series · 8 of 24 positions shown · non-contrast
Comparison: Previous exam(s).

CLINICAL DATA: Screening.

EXAM:
DIGITAL SCREENING BILATERAL MAMMOGRAM WITH TOMOSYNTHESIS AND CAD
TECHNIQUE: Bilateral screening digital craniocaudal and mediolateral oblique
mammograms were obtained. Bilateral screening digital breast
tomosynthesis was performed. The images were evaluated with
computer-aided detection.

[R CC synth-2D]
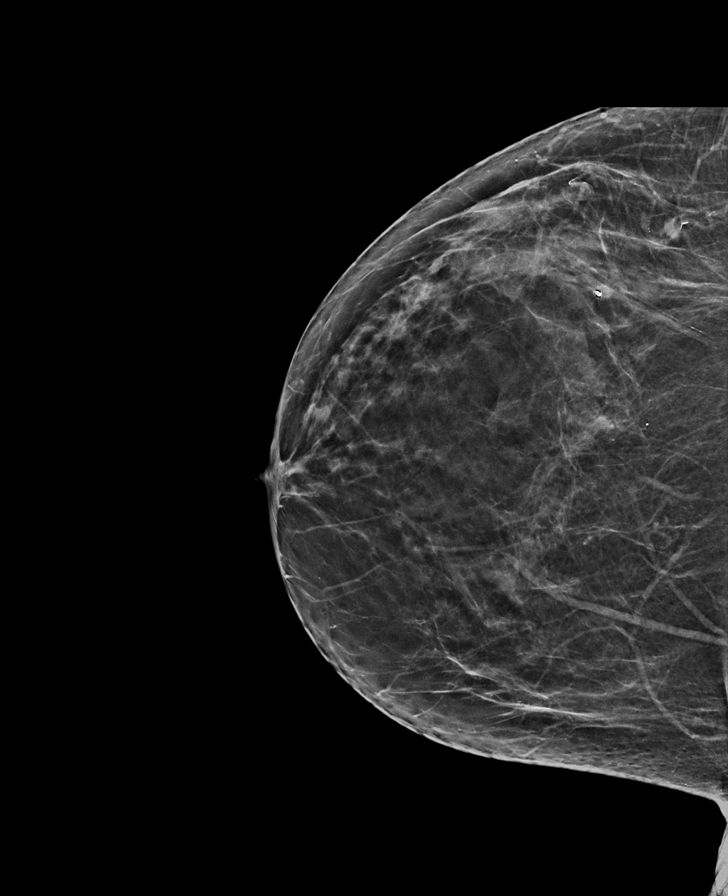

[L MLO synth-2D]
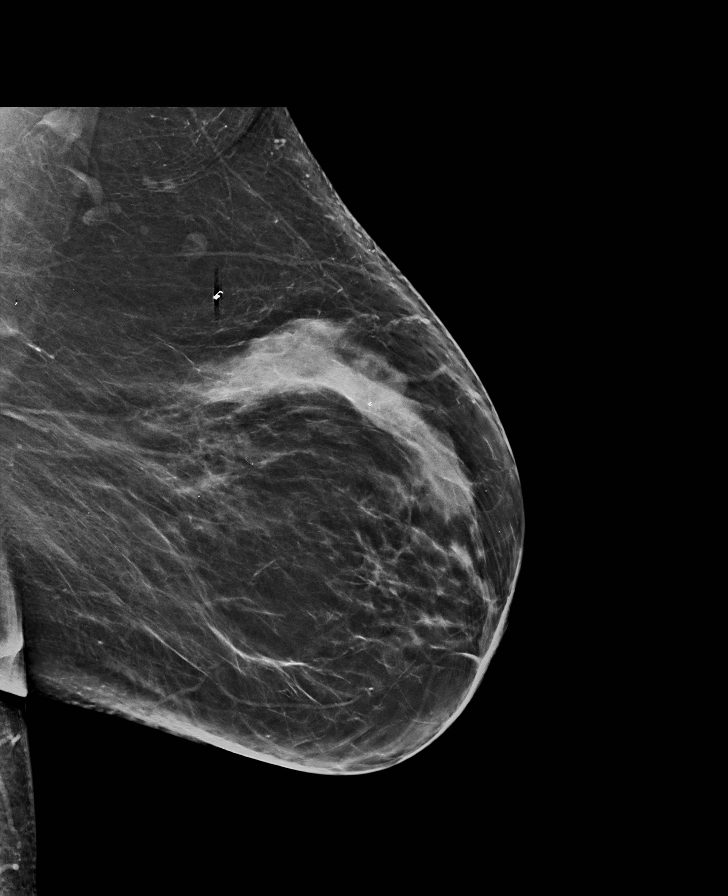

[L CC synth-2D]
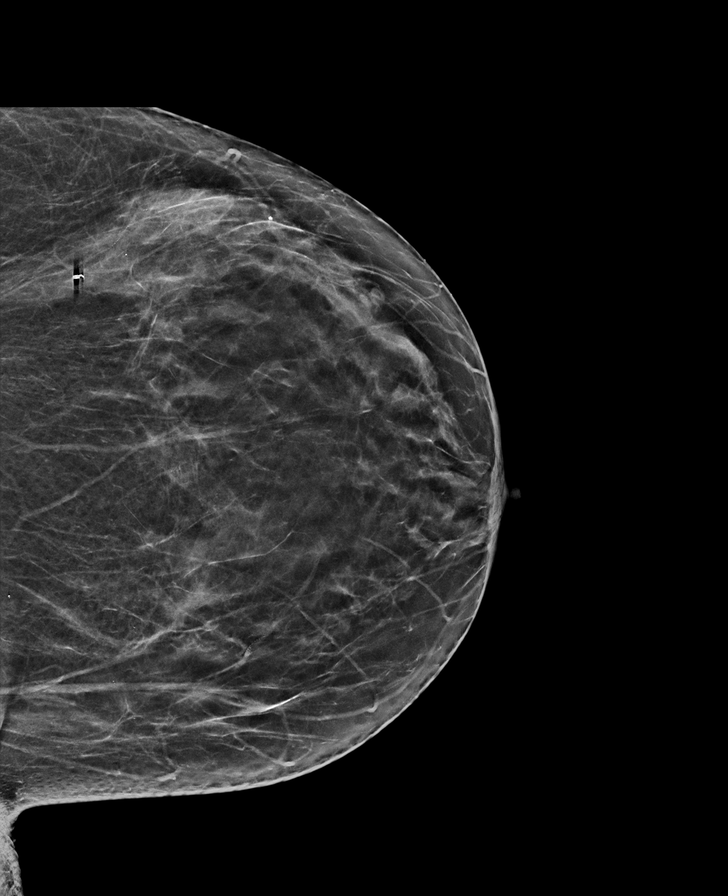

[R MLO synth-2D]
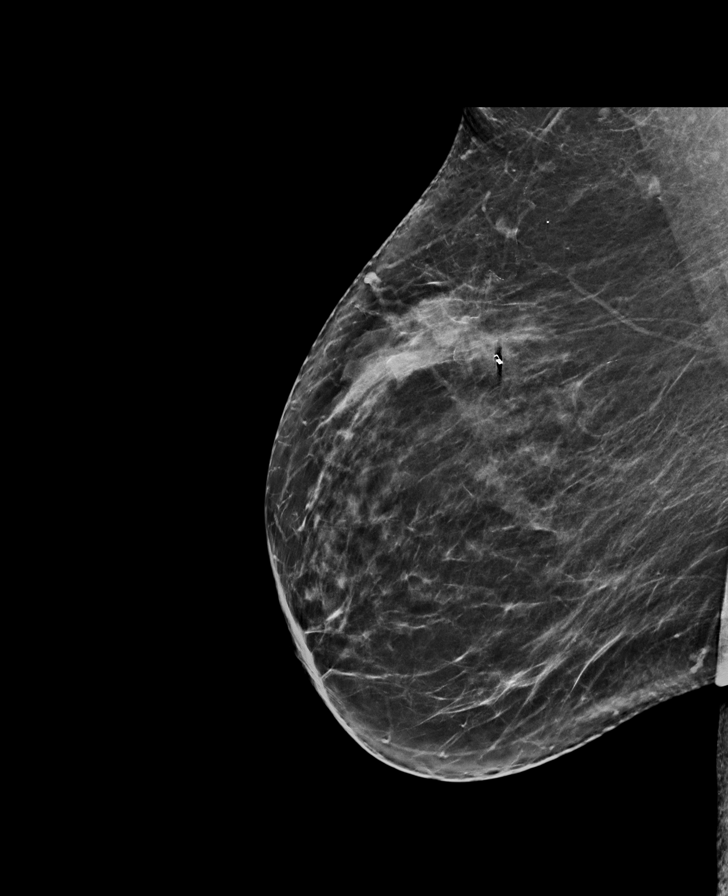

[R CC tomo · tomo slice 29/57.0]
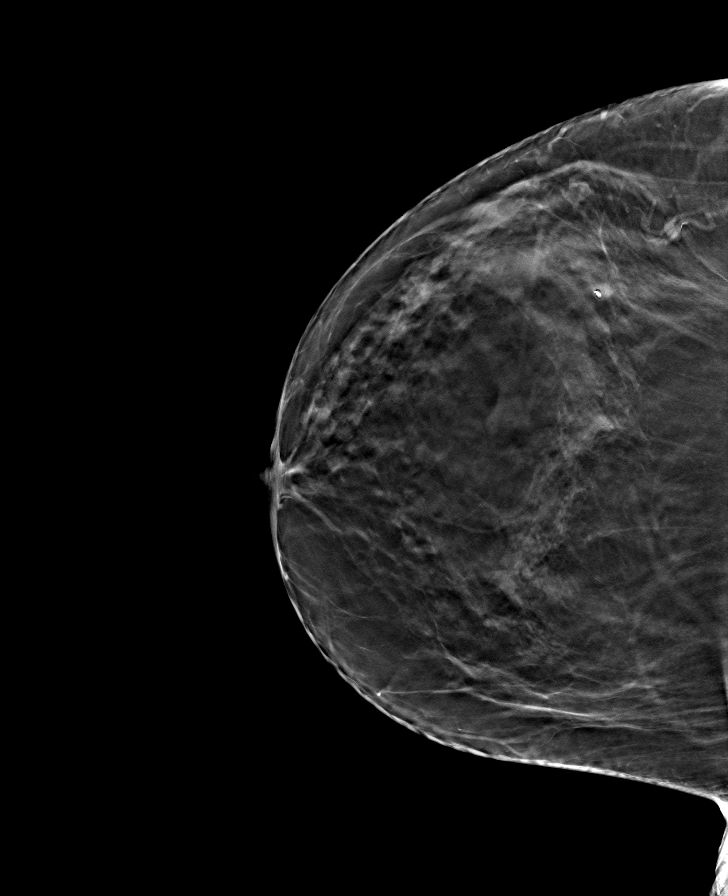

[L MLO tomo · tomo slice 39/76.0]
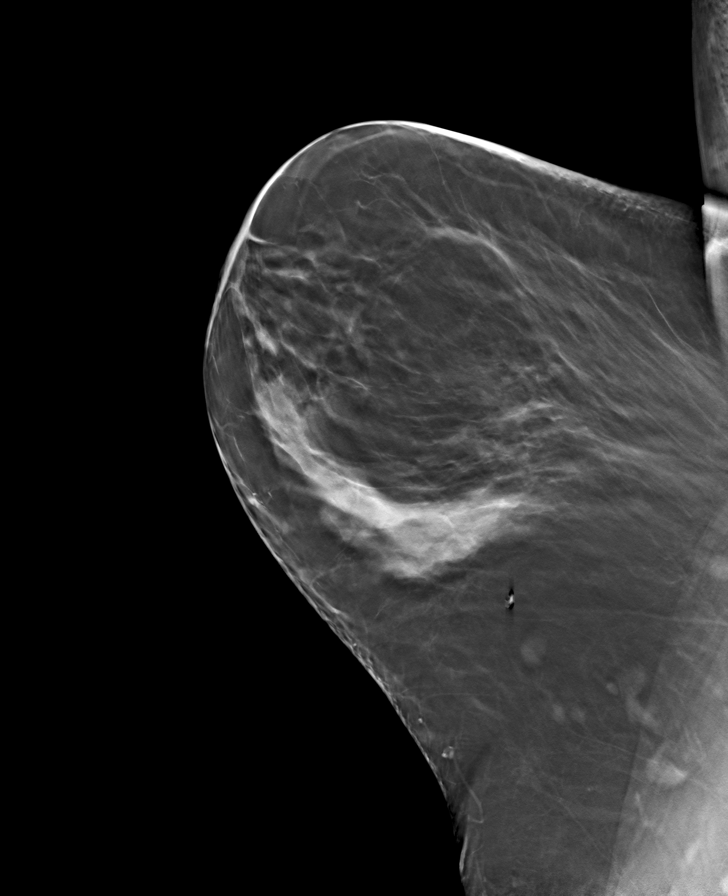

[L CC tomo · tomo slice 30/59.0]
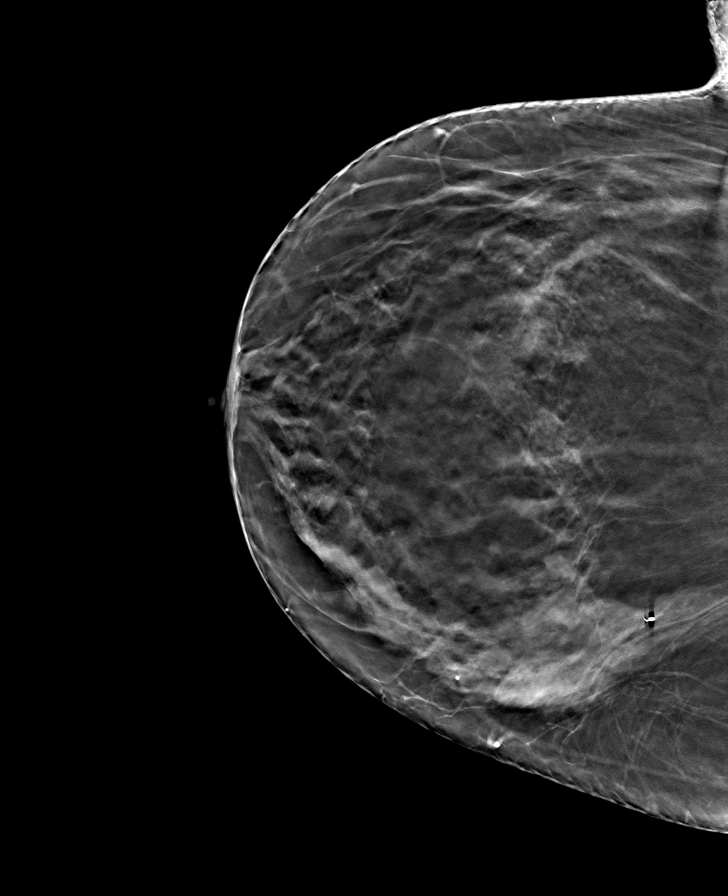

[R MLO tomo · tomo slice 35/69.0]
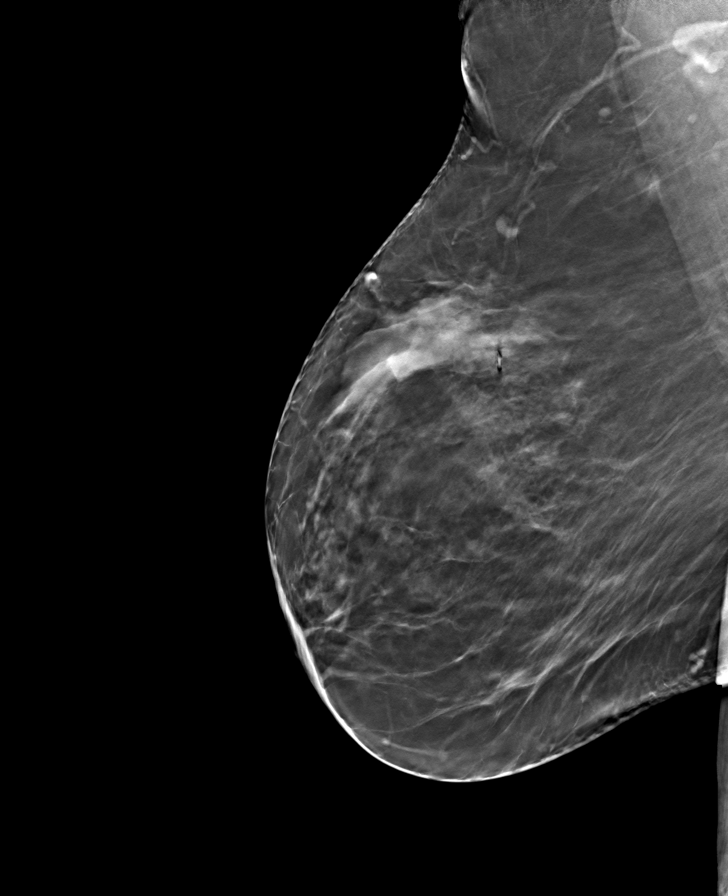

[8 of 24 positions shown; findings below may reference images not displayed]

ACR Breast Density Category c: The breast tissue is heterogeneously
dense, which may obscure small masses.
FINDINGS: In the right breast, a possible asymmetry warrants further
evaluation. In the left breast, no findings suspicious for
malignancy.
IMPRESSION: Further evaluation is suggested for possible asymmetry in the right
breast.

RECOMMENDATION:
Diagnostic mammogram and possibly ultrasound of the right breast.
(Code:[id number])

The patient will be contacted regarding the findings, and additional
imaging will be scheduled.

BI-RADS CATEGORY  0: Incomplete. Need additional imaging evaluation
and/or prior mammograms for comparison.

## 2023-03-14 ENCOUNTER — Other Ambulatory Visit: Payer: Self-pay | Admitting: Family

## 2023-03-14 DIAGNOSIS — I119 Hypertensive heart disease without heart failure: Secondary | ICD-10-CM

## 2023-03-16 ENCOUNTER — Other Ambulatory Visit: Payer: Self-pay | Admitting: Internal Medicine

## 2023-03-17 ENCOUNTER — Other Ambulatory Visit: Payer: Self-pay | Admitting: Family

## 2023-03-28 ENCOUNTER — Other Ambulatory Visit: Payer: Self-pay | Admitting: Family

## 2023-03-28 DIAGNOSIS — Z8679 Personal history of other diseases of the circulatory system: Secondary | ICD-10-CM

## 2023-04-07 ENCOUNTER — Ambulatory Visit: Payer: PPO | Admitting: *Deleted

## 2023-04-07 VITALS — BP 124/59 | HR 56 | Temp 97.9°F | Resp 18 | Ht <= 58 in | Wt 210.6 lb

## 2023-04-07 DIAGNOSIS — K50111 Crohn's disease of large intestine with rectal bleeding: Secondary | ICD-10-CM | POA: Diagnosis not present

## 2023-04-07 MED ORDER — SODIUM CHLORIDE 0.9 % IV SOLN
10.0000 mg/kg | Freq: Once | INTRAVENOUS | Status: AC
  Administered 2023-04-07: 1000 mg via INTRAVENOUS
  Filled 2023-04-07: qty 100

## 2023-04-07 MED ORDER — ACETAMINOPHEN 325 MG PO TABS
650.0000 mg | ORAL_TABLET | Freq: Once | ORAL | Status: AC
Start: 2023-04-07 — End: 2023-04-07
  Administered 2023-04-07: 650 mg via ORAL
  Filled 2023-04-07: qty 2

## 2023-04-07 MED ORDER — DIPHENHYDRAMINE HCL 25 MG PO CAPS
25.0000 mg | ORAL_CAPSULE | Freq: Once | ORAL | Status: AC
Start: 2023-04-07 — End: 2023-04-07
  Administered 2023-04-07: 25 mg via ORAL
  Filled 2023-04-07: qty 1

## 2023-04-07 NOTE — Progress Notes (Signed)
Diagnosis: Crohn's Disease  Provider:  Chilton Greathouse MD  Procedure: IV Infusion  IV Type: Peripheral, IV Location: L Antecubital  Remicade (Infliximab), Dose: 1000 mg  Infusion Start Time: 1123 am  Infusion Stop Time: 1338 pm  Post Infusion IV Care: Observation period completed and Peripheral IV Discontinued  Discharge: Condition: Good, Destination: Home . AVS Declined  Performed by:  Forrest Moron, RN

## 2023-04-22 ENCOUNTER — Other Ambulatory Visit: Payer: Self-pay | Admitting: Family

## 2023-04-22 ENCOUNTER — Other Ambulatory Visit: Payer: Self-pay | Admitting: Internal Medicine

## 2023-04-22 DIAGNOSIS — E119 Type 2 diabetes mellitus without complications: Secondary | ICD-10-CM

## 2023-04-23 LAB — HM DIABETES EYE EXAM

## 2023-04-26 ENCOUNTER — Encounter: Payer: Self-pay | Admitting: *Deleted

## 2023-05-04 ENCOUNTER — Other Ambulatory Visit: Payer: Self-pay | Admitting: Family

## 2023-05-04 ENCOUNTER — Ambulatory Visit: Payer: PPO | Admitting: Family

## 2023-05-04 VITALS — BP 128/46 | HR 64 | Temp 97.7°F | Resp 16 | Ht <= 58 in | Wt 210.0 lb

## 2023-05-04 DIAGNOSIS — K50111 Crohn's disease of large intestine with rectal bleeding: Secondary | ICD-10-CM | POA: Diagnosis not present

## 2023-05-04 DIAGNOSIS — E119 Type 2 diabetes mellitus without complications: Secondary | ICD-10-CM

## 2023-05-04 DIAGNOSIS — R809 Proteinuria, unspecified: Secondary | ICD-10-CM

## 2023-05-04 DIAGNOSIS — K2100 Gastro-esophageal reflux disease with esophagitis, without bleeding: Secondary | ICD-10-CM | POA: Diagnosis not present

## 2023-05-04 DIAGNOSIS — E1165 Type 2 diabetes mellitus with hyperglycemia: Secondary | ICD-10-CM

## 2023-05-04 DIAGNOSIS — K581 Irritable bowel syndrome with constipation: Secondary | ICD-10-CM

## 2023-05-04 DIAGNOSIS — N1832 Chronic kidney disease, stage 3b: Secondary | ICD-10-CM | POA: Diagnosis not present

## 2023-05-04 DIAGNOSIS — R778 Other specified abnormalities of plasma proteins: Secondary | ICD-10-CM | POA: Diagnosis not present

## 2023-05-04 DIAGNOSIS — E538 Deficiency of other specified B group vitamins: Secondary | ICD-10-CM

## 2023-05-04 DIAGNOSIS — I10 Essential (primary) hypertension: Secondary | ICD-10-CM | POA: Diagnosis not present

## 2023-05-04 DIAGNOSIS — Z7984 Long term (current) use of oral hypoglycemic drugs: Secondary | ICD-10-CM

## 2023-05-04 DIAGNOSIS — M109 Gout, unspecified: Secondary | ICD-10-CM

## 2023-05-04 DIAGNOSIS — G43009 Migraine without aura, not intractable, without status migrainosus: Secondary | ICD-10-CM

## 2023-05-04 LAB — BASIC METABOLIC PANEL
BUN: 16 mg/dL (ref 6–23)
CO2: 31 meq/L (ref 19–32)
Calcium: 9.4 mg/dL (ref 8.4–10.5)
Chloride: 101 meq/L (ref 96–112)
Creatinine, Ser: 1.34 mg/dL — ABNORMAL HIGH (ref 0.40–1.20)
GFR: 40.38 mL/min — ABNORMAL LOW (ref >60.00)
Glucose, Bld: 206 mg/dL — ABNORMAL HIGH (ref 70–99)
Potassium: 4 meq/L (ref 3.5–5.1)
Sodium: 140 meq/L (ref 135–145)

## 2023-05-04 LAB — HEMOGLOBIN A1C: Hgb A1c MFr Bld: 6.9 % — ABNORMAL HIGH (ref 4.6–6.5)

## 2023-05-04 LAB — VITAMIN B12: Vitamin B-12: 260 pg/mL (ref 211–911)

## 2023-05-04 NOTE — Patient Instructions (Signed)
 VISIT SUMMARY:  Today, we reviewed your overall health and management of your chronic conditions, including diabetes, hypertension, kidney health, irritable bowel syndrome, gout, and Crohn's disease. We also discussed your recent blood sugar levels, blood pressure, and other relevant health concerns.  YOUR PLAN:  -DIABETES MELLITUS: Diabetes is a condition where your blood sugar levels are higher than normal. Your A1c level is 6.8, which is slightly down from 6.9. You are currently taking Glipizide  10mg  and Januvia  50mg , and using a Freestyle meter to monitor your blood sugar. Your blood sugar levels are typically around 112, but sometimes drop to 75-80 at night. Continue your current medications. We will check your B12 level today because it was low before. If your blood sugar drops below 75, we may need to reduce your Glipizide  dose.  -HYPERTENSION: Hypertension is high blood pressure. Your blood pressure today is 128/40. You are taking Hydrochlorothiazide , Amlodipine , and Valsartan . Continue your current medications.  -CHRONIC KIDNEY DISEASE: Chronic kidney disease means your kidneys are not working as well as they should. You are taking Valsartan  for your kidney health and are being followed by Dr. Dennise at [facility]. Continue taking Valsartan .  -IRRITABLE BOWEL SYNDROME AND CONSTIPATION: Irritable bowel syndrome is a condition that affects your digestive system, causing symptoms like stomach cramps, bloating, diarrhea, and constipation. You reported occasional days of increased bowel movements, up to 5 times a day, but your stools are of regular consistency. Continue taking your current medications, including Amitiza .  -GOUT: Gout is a type of arthritis that causes sudden, severe joint pain. You have not had any recent gout flare-ups. Continue your current management.  -ABNORMAL PROTEIN IN BLOOD: You have an abnormal protein in your blood, but your specialist, Dr. Timmy (your  hematologist) is not concerned at this time. No changes to your management are planned at this time.  -FOOT EXAM: During your foot exam, we found that you have dry skin but intact sensation. Please moisturize your feet daily to keep the skin healthy.  INSTRUCTIONS:  Please follow up in 3 months, or sooner if needed. We will check your B12 level today.

## 2023-05-04 NOTE — Assessment & Plan Note (Signed)
 Stable, no recent symptoms. Has colchicine on hand for prn use.

## 2023-05-04 NOTE — Assessment & Plan Note (Addendum)
 She is being followed by GI and maintained on Remicade/azothiaprine. Overall stable.

## 2023-05-04 NOTE — Assessment & Plan Note (Signed)
 Continues oral b12 tabs. Update level. If low consider B12 injection.

## 2023-05-04 NOTE — Assessment & Plan Note (Signed)
 Followed by Nephrology- Dr. Thedore Mins at [facility].

## 2023-05-04 NOTE — Assessment & Plan Note (Signed)
 Stable no recent migraines.

## 2023-05-04 NOTE — Assessment & Plan Note (Signed)
Continue valsartan for renal protection 

## 2023-05-04 NOTE — Assessment & Plan Note (Signed)
She is following with Hematology

## 2023-05-04 NOTE — Assessment & Plan Note (Addendum)
 At goal, continue valsartan/amlodipine/hydrochlorothiazide/toprol xl.

## 2023-05-04 NOTE — Progress Notes (Signed)
 Subjective:     Patient ID: Kathy [name], female    DOB: 10/26/1955, 68 y.o.   MRN: [id number]  Chief Complaint  Patient presents with   Diabetes    Here for follow up   Hypertension    Here for follow up    HPI  Discussed the use of AI scribe software for clinical note transcription with the patient, who gave verbal consent to proceed.  History of Present Illness   Kathy [name], a patient with a complex medical history including diabetes, migraines, kidney issues, constipation, hypertension, heartburn, gout, abnormal protein in blood, and Crohn's disease, presents for a routine follow-up. The patient's diabetes is managed with glipizide  and Januvia , and she has been using a Freestyle meter to monitor her blood sugar levels. She reports that her blood sugar levels are typically around 112, but she has noticed that it drops to around 75-80 at night on occasion. She has not experienced any recent migraines. Her microalbuminuria is being managed with valsartan . The patient's constipation seems to be improving, and she is taking Amitiza  for irritable bowel syndrome. She is also on hydrochlorothiazide  and amlodipine  for hypertension. The patient has not had any recent heartburn issues and is on omeprazole . She has not had any recent gout flare-ups. She has been seen by a specialist for the abnormal protein in her blood, which does not seem to be a cause for concern. The patient's Crohn's disease has intermittent flare-ups, during which she experiences increased bowel movements, up to five times a day. The patient's B12 levels were on the low end at her last check, and she is taking a daily B12 pill.      Lab Results  Component Value Date   HGBA1C 6.8 (H) 01/24/2023   HGBA1C 6.9 (H) 10/22/2022   HGBA1C 6.6 (H) 07/06/2022   Lab Results  Component Value Date   MICROALBUR 9.5 (H) 07/06/2022   LDLCALC 60 01/24/2023   CREATININE 1.41 (H) 01/24/2023       Health Maintenance Due  Topic  Date Due   COVID-19 Vaccine (6 - 2024-25 season) 04/26/2023    Past Medical History:  Diagnosis Date   Anal stenosis    Asthma    09-23-2017  per pt has not done inhaler since May 2018, no insurance (QVAR bid and Ventolin  prn)   B12 deficiency 01/05/2021   Crohn disease (HCC)    Diastolic dysfunction    per echo 06-23-2009  grade 2 diastolic dysfunction , ef 60-65%   Esophagitis    GERD (gastroesophageal reflux disease)    Hemorrhoids    Hiatal hernia    Hyperlipidemia    stopped meds due to side effects   Hypertension    09-23-2017  per pt has takes bp meds since May 2018 no insurance (losartan 100mg  qd, HCTZ 12.5mg  qd, and toprol  50 qd)   Microalbuminuria 06/01/2006   Qualifier: Diagnosis of  By: Tobie MD, Sejal     Type 2 diabetes mellitus (HCC)    09-23-2017 per pt has not taken diabetic meds since May 2018, no insurance (kombiglyze 5-1000mg  qd)    Past Surgical History:  Procedure Laterality Date   BREAST BIOPSY Bilateral    CARDIOVASCULAR STRESS TEST  07/29/2009   normal nuclear study w/ no ischemia/  normal LV function and wall motion,  ef 70%   CESAREAN SECTION  1980s   RECTAL BIOPSY N/A 10/03/2017   Procedure: POSSIBLE BIOPSY RECTAL;  Surgeon: Teresa Lonni HERO, MD;  Location: [facility];  Service: General;  Laterality: N/A;    Family History  Problem Relation Age of Onset   Hypertension Mother 73   Stroke Mother 58   Diabetes Maternal Aunt        s/p bilater amputation due to DM   Asthma Father    Colon cancer Neg Hx    Esophageal cancer Neg Hx    Stomach cancer Neg Hx    Rectal cancer Neg Hx     Social History   Socioeconomic History   Marital status: Married    Spouse name: Not on file   Number of children: 2   Years of education: Not on file   Highest education level: Not on file  Occupational History   Occupation: unemployed  Tobacco Use   Smoking status: Never   Smokeless tobacco: Never  Vaping Use   Vaping status: Never Used  Substance  and Sexual Activity   Alcohol use: No   Drug use: No   Sexual activity: Not Currently  Other Topics Concern   Not on file  Social History Narrative   Not working currently. Retired in 2019. Previously worked in [employer] job   Married    2 children (son and daughter). They are both local   She has 1 grand-daughter and 1 grand-son   No pets (allergic)   Completed 4 years of college.   Social Drivers of Corporate Investment Banker Strain: Low Risk  (08/13/2022)   Overall Financial Resource Strain (CARDIA)    Difficulty of Paying Living Expenses: Not hard at all  Food Insecurity: No Food Insecurity (11/24/2022)   Hunger Vital Sign    Worried About Running Out of Food in the Last Year: Never true    Ran Out of Food in the Last Year: Never true  Transportation Needs: No Transportation Needs (11/24/2022)   PRAPARE - Administrator, Civil Service (Medical): No    Lack of Transportation (Non-Medical): No  Physical Activity: Inactive (08/13/2022)   Exercise Vital Sign    Days of Exercise per Week: 0 days    Minutes of Exercise per Session: 0 min  Stress: No Stress Concern Present (08/13/2022)   [employer] of Occupational Health - Occupational Stress Questionnaire    Feeling of Stress : Only a little  Social Connections: Moderately Integrated (08/13/2022)   Social Connection and Isolation Panel [NHANES]    Frequency of Communication with Friends and Family: More than three times a week    Frequency of Social Gatherings with Friends and Family: Twice a week    Attends Religious Services: More than 4 times per year    Active Member of [employer] or Organizations: No    Attends Banker Meetings: Never    Marital Status: Married  Catering Manager Violence: Not At Risk (11/24/2022)   Humiliation, Afraid, Rape, and Kick questionnaire    Fear of Current or Ex-Partner: No    Emotionally Abused: No    Physically Abused: No    Sexually Abused: No    Outpatient  Medications Prior to Visit  Medication Sig Dispense Refill   albuterol  (VENTOLIN  HFA) 108 (90 Base) MCG/ACT inhaler Inhale 1 puff into the lungs every 6 (six) hours as needed for wheezing or shortness of breath. 18 g 5   amLODipine  (NORVASC ) 10 MG tablet Take 1 tablet by mouth once daily 90 tablet 0   azaTHIOprine  (IMURAN ) 50 MG tablet Take 2 tablets (100 mg total) by mouth daily. Please call [phone]  to schedule an office visit for more refills 60 tablet 0   clotrimazole -betamethasone  (LOTRISONE ) cream Apply 1 application topically 2 (two) times daily. 30 g 1   colchicine  0.6 MG tablet Take 2 tabs by mouth now and 1 tab in 1 hour. 6 tablet 1   Continuous Glucose Receiver (FREESTYLE LIBRE 3 READER) DEVI Use as directed to monitor blood sugar 1 each 0   Continuous Glucose Sensor (FREESTYLE LIBRE 3 SENSOR) MISC Use as directed to monitor blood sugar. Change every 14 days 2 each 5   dicyclomine  (BENTYL ) 10 MG capsule TAKE 1 CAPSULE BY MOUTH THREE TIMES DAILY AS NEEDED FOR  CRAMPING 90 capsule 2   glipiZIDE  (GLUCOTROL  XL) 10 MG 24 hr tablet Take 1 tablet by mouth once daily with breakfast 90 tablet 0   hydrochlorothiazide  (MICROZIDE ) 12.5 MG capsule Take 1 capsule by mouth once daily 90 capsule 0   hydrocortisone  (ANUSOL -HC) 25 MG suppository Place 1 suppository (25 mg total) rectally at bedtime. 14 suppository 0   influenza vaccine adjuvanted (FLUAD ) 0.5 ML injection Inject into the muscle. 0.5 mL 0   Iron , Ferrous Sulfate , 325 (65 Fe) MG TABS Take 325 mg by mouth every other day.     lubiprostone  (AMITIZA ) 8 MCG capsule TAKE 1 CAPSULE BY MOUTH TWICE DAILY WITH A MEAL . KEEP UPCOMING APPOINTMENT FOR FUTURE REFILLS 60 capsule 3   metoprolol  succinate (TOPROL -XL) 50 MG 24 hr tablet Take 1 tablet (50 mg total) by mouth daily. Take with or immediately following a meal 90 tablet 1   Multiple Vitamins-Minerals (MULTIVITAMIN WITH MINERALS) tablet Take 1 tablet by mouth daily.     omeprazole  (PRILOSEC) 40  MG capsule Take 1 capsule by mouth once daily 90 capsule 0   polyethylene glycol (MIRALAX  / GLYCOLAX ) 17 g packet Take 17 g by mouth as needed. 1 capful prn 30 each 11   REMICADE  100 MG injection INFUSE 1000MG  IV EVERY 6 WEEKS 10 mL 4   simvastatin  (ZOCOR ) 10 MG tablet Take 1 tablet by mouth once daily 90 tablet 0   sitaGLIPtin  (JANUVIA ) 50 MG tablet Take 1 tablet (50 mg total) by mouth daily. 90 tablet 1   valsartan  (DIOVAN ) 40 MG tablet Take 1 tablet (40 mg total) by mouth daily. 90 tablet 0   vitamin B-12 (CYANOCOBALAMIN ) 1000 MCG tablet Take 1 tablet (1,000 mcg total) by mouth daily. 1 tablet 0   COVID-19 mRNA vaccine 2023-2024 (COMIRNATY ) syringe Inject into the muscle. 0.3 mL 0   cephALEXin  (KEFLEX ) 500 MG capsule Take 1 capsule (500 mg total) by mouth 2 (two) times daily. 10 capsule 0   No facility-administered medications prior to visit.    Allergies  Allergen Reactions   Metformin Nausea Only   Lipitor [Atorvastatin Calcium]     Leg cramps    Lisinopril Itching   Pravachol [Pravastatin Sodium]    Pravastatin Sodium Other (See Comments)    leg cramps    ROS See HPI    Objective:    Physical Exam Constitutional:      General: She is not in acute distress.    Appearance: Normal appearance. She is well-developed.  HENT:     Head: Normocephalic and atraumatic.     Right Ear: External ear normal.     Left Ear: External ear normal.  Eyes:     General: No scleral icterus. Neck:     Thyroid : No thyromegaly.  Cardiovascular:     Rate and Rhythm: Normal rate and regular rhythm.  Heart sounds: Normal heart sounds. No murmur heard. Pulmonary:     Effort: Pulmonary effort is normal. No respiratory distress.     Breath sounds: Normal breath sounds. No wheezing.  Musculoskeletal:     Cervical back: Neck supple.  Skin:    General: Skin is warm and dry.  Neurological:     Mental Status: She is alert and oriented to person, place, and time.  Psychiatric:        Mood  and Affect: Mood normal.        Behavior: Behavior normal.        Thought Content: Thought content normal.        Judgment: Judgment normal.    Diabetic Foot Exam - Simple   Simple Foot Form Diabetic Foot exam was performed with the following findings: Yes 05/04/2023 10:19 AM  Visual Inspection No deformities, no ulcerations, no other skin breakdown bilaterally: Yes Sensation Testing Intact to touch and monofilament testing bilaterally: Yes Pulse Check Posterior Tibialis and Dorsalis pulse intact bilaterally: Yes Comments       BP (!) 128/46 (BP Location: Right Arm, Patient Position: Sitting, Cuff Size: Large)   Pulse 64   Temp 97.7 F (36.5 C) (Oral)   Resp 16   Ht 4' 10 (1.473 m)   Wt 210 lb (95.3 kg)   SpO2 100%   BMI 43.89 kg/m  Wt Readings from Last 3 Encounters:  05/04/23 210 lb (95.3 kg)  04/07/23 210 lb 9.6 oz (95.5 kg)  02/24/23 211 lb 6.4 oz (95.9 kg)       Assessment & Plan:   Problem List Items Addressed This Visit       High   Irritable bowel syndrome with constipation   Stable with amitiza , continue same.         Unprioritized   Essential hypertension (Chronic)   At goal, continue valsartan /amlodipine /hydrochlorothiazide /toprol  xl.      Uncontrolled type 2 diabetes mellitus with hyperglycemia (HCC)   Lab Results  Component Value Date   HGBA1C 6.8 (H) 01/24/2023   HGBA1C 6.9 (H) 10/22/2022   HGBA1C 6.6 (H) 07/06/2022   Lab Results  Component Value Date   MICROALBUR 9.5 (H) 07/06/2022   LDLCALC 60 01/24/2023   CREATININE 1.41 (H) 01/24/2023   Stable on current regimen, continue same.       Migraine without aura   Stable no recent migraines.       Microalbuminuria   Continue valsartan  for renal protection.       Gout   Stable, no recent symptoms. Has colchicine  on hand for prn use.       GERD (gastroesophageal reflux disease)   Stable on omeprazole , continue same.       Crohn disease (HCC)   She is being followed by GI  and maintained on Remicade /azothiaprine. Overall stable.        Controlled type 2 diabetes mellitus without complication, without long-term current use of insulin (HCC) - Primary   Relevant Orders   HgB A1c   Basic Metabolic Panel (BMET)   Chronic kidney disease, stage 3b (HCC)   Followed by Nephrology- Dr. Dennise at [facility].       B12 deficiency   Continues oral b12 tabs. Update level. If low consider B12 injection.       Relevant Orders   B12   Abnormal SPEP   She is following with Hematology.        I have discontinued Gregoria Smith-Milliken's Comirnaty  and cephALEXin .  I am also having her maintain her polyethylene glycol, clotrimazole -betamethasone , hydrocortisone , multivitamin with minerals, colchicine , cyanocobalamin , albuterol , dicyclomine , metoprolol  succinate, sitaGLIPtin , Iron  (Ferrous Sulfate ), lubiprostone , FreeStyle Libre 3 Reader, FreeStyle Libre 3 Sensor, Fluad , Remicade , amLODipine , omeprazole , valsartan , simvastatin , hydrochlorothiazide , azaTHIOprine , and glipiZIDE .  No orders of the defined types were placed in this encounter.

## 2023-05-04 NOTE — Assessment & Plan Note (Signed)
 Stable with amitiza, continue same.

## 2023-05-04 NOTE — Assessment & Plan Note (Signed)
 Stable on omeprazole, continue same.

## 2023-05-04 NOTE — Assessment & Plan Note (Signed)
 Lab Results  Component Value Date   HGBA1C 6.8 (H) 01/24/2023   HGBA1C 6.9 (H) 10/22/2022   HGBA1C 6.6 (H) 07/06/2022   Lab Results  Component Value Date   MICROALBUR 9.5 (H) 07/06/2022   LDLCALC 60 01/24/2023   CREATININE 1.41 (H) 01/24/2023   Stable on current regimen, continue same.

## 2023-05-05 ENCOUNTER — Other Ambulatory Visit: Payer: Self-pay

## 2023-05-05 ENCOUNTER — Telehealth: Payer: Self-pay | Admitting: Family

## 2023-05-05 DIAGNOSIS — E538 Deficiency of other specified B group vitamins: Secondary | ICD-10-CM

## 2023-05-05 NOTE — Telephone Encounter (Signed)
 Called patient but no answer, left voice mail for patient to call back.  Ok to relay and schedule B12 lab appointment to be done in 12 weeks.

## 2023-05-05 NOTE — Telephone Encounter (Signed)
 B12 is still low despite her b12 pills.  I would like for her to start b12 injections 1000 mcg IM weekly x 4 then monthly.    She can stop her b12 pills.   Kidney function remains reduced but stable.  Sugar is at goal.

## 2023-05-10 NOTE — Telephone Encounter (Signed)
 Patient notified of results and recommendations. She is hesitant to schedule injections and said she will call us back to set up appointment once she decides to start taking this.

## 2023-05-17 ENCOUNTER — Telehealth: Payer: Self-pay | Admitting: Pharmacy Technician

## 2023-05-17 NOTE — Telephone Encounter (Signed)
PAP forms are still pending.    Will need to f/u.  Patients appt has been cancelled/rescheduled until medication has arrived. Once medication has arrived we will reschedule patient.  Selena Batten

## 2023-05-19 ENCOUNTER — Ambulatory Visit: Payer: PPO

## 2023-05-25 ENCOUNTER — Ambulatory Visit (INDEPENDENT_AMBULATORY_CARE_PROVIDER_SITE_OTHER): Payer: PPO

## 2023-05-25 VITALS — BP 126/83 | HR 65 | Temp 97.7°F | Resp 18 | Ht <= 58 in | Wt 211.6 lb

## 2023-05-25 DIAGNOSIS — K50111 Crohn's disease of large intestine with rectal bleeding: Secondary | ICD-10-CM | POA: Diagnosis not present

## 2023-05-25 MED ORDER — ACETAMINOPHEN 325 MG PO TABS
650.0000 mg | ORAL_TABLET | Freq: Once | ORAL | Status: AC
  Administered 2023-05-25: 650 mg via ORAL
  Filled 2023-05-25: qty 2

## 2023-05-25 MED ORDER — DIPHENHYDRAMINE HCL 25 MG PO CAPS
25.0000 mg | ORAL_CAPSULE | Freq: Once | ORAL | Status: AC
  Administered 2023-05-25: 25 mg via ORAL
  Filled 2023-05-25: qty 1

## 2023-05-25 MED ORDER — SODIUM CHLORIDE 0.9 % IV SOLN
10.0000 mg/kg | Freq: Once | INTRAVENOUS | Status: AC
  Administered 2023-05-25: 1000 mg via INTRAVENOUS
  Filled 2023-05-25: qty 100

## 2023-05-25 NOTE — Progress Notes (Signed)
Diagnosis: Crohn's Disease  Provider:  Chilton Greathouse MD  Procedure: IV Infusion  IV Type: Peripheral, IV Location: L Antecubital  Remicade (Infliximab), Dose: 1000 mg  Infusion Start Time: 1354  Infusion Stop Time: 1612  Post Infusion IV Care: Peripheral IV Discontinued  Discharge: Condition: Good, Destination: Home . AVS Declined  Performed by:  Rico Ala, LPN

## 2023-05-26 ENCOUNTER — Other Ambulatory Visit: Payer: Self-pay | Admitting: Internal Medicine

## 2023-05-26 ENCOUNTER — Telehealth: Payer: Self-pay | Admitting: Internal Medicine

## 2023-05-26 MED ORDER — AZATHIOPRINE 50 MG PO TABS: 100.0000 mg | ORAL_TABLET | Freq: Every day | ORAL | 1 refills | Status: DC

## 2023-05-26 NOTE — Telephone Encounter (Signed)
Patient called wanting to follow up with Dr. Rhea Belton for medication refills, no available spots. Patient is requesting to speak with a nurse to discuss further questions

## 2023-05-26 NOTE — Telephone Encounter (Signed)
Informed patient that Dr. Rhea Belton does not have any available appointments right now but I will contact her when our May schedule becomes available. Also, that I will send refills of azathioprine to her pharmacy until her appt. Patient verbalized understanding.

## 2023-06-14 ENCOUNTER — Telehealth: Payer: Self-pay | Admitting: Pharmacy Technician

## 2023-06-14 NOTE — Telephone Encounter (Signed)
PAP was denied, but is being appealed. Next appt 07/07/23.

## 2023-06-21 ENCOUNTER — Other Ambulatory Visit: Payer: Self-pay | Admitting: Family

## 2023-06-21 DIAGNOSIS — I119 Hypertensive heart disease without heart failure: Secondary | ICD-10-CM

## 2023-06-22 ENCOUNTER — Telehealth: Payer: Self-pay | Admitting: Family

## 2023-06-22 NOTE — Telephone Encounter (Signed)
 Copied from CRM [id number]. Topic: Medicare AWV >> Jun 22, 2023 11:19 AM Payton Doughty wrote: Reason for CRM: Called LVM 06/22/2023 to schedule AWV. Please schedule Virtual or Telehealth visits ONLY.   Verlee Rossetti; Care Guide Ambulatory Clinical Support [place] [facility] Direct Dial: [phone]

## 2023-06-27 ENCOUNTER — Other Ambulatory Visit: Payer: Self-pay | Admitting: Family

## 2023-07-04 ENCOUNTER — Other Ambulatory Visit: Payer: Self-pay | Admitting: Internal Medicine

## 2023-07-06 ENCOUNTER — Ambulatory Visit: Payer: PPO

## 2023-07-07 ENCOUNTER — Ambulatory Visit: Payer: PPO

## 2023-07-07 VITALS — BP 100/61 | HR 52 | Temp 97.5°F | Resp 20 | Ht <= 58 in | Wt 210.6 lb

## 2023-07-07 DIAGNOSIS — K50111 Crohn's disease of large intestine with rectal bleeding: Secondary | ICD-10-CM | POA: Diagnosis not present

## 2023-07-07 MED ORDER — SODIUM CHLORIDE 0.9 % IV SOLN
10.0000 mg/kg | Freq: Once | INTRAVENOUS | Status: AC
  Administered 2023-07-07: 1000 mg via INTRAVENOUS
  Filled 2023-07-07: qty 100

## 2023-07-07 MED ORDER — DIPHENHYDRAMINE HCL 25 MG PO CAPS
25.0000 mg | ORAL_CAPSULE | Freq: Once | ORAL | Status: AC
  Administered 2023-07-07: 25 mg via ORAL
  Filled 2023-07-07: qty 1

## 2023-07-07 MED ORDER — ACETAMINOPHEN 325 MG PO TABS
650.0000 mg | ORAL_TABLET | Freq: Once | ORAL | Status: AC
  Administered 2023-07-07: 650 mg via ORAL
  Filled 2023-07-07: qty 2

## 2023-07-07 NOTE — Progress Notes (Signed)
 Diagnosis: Crohn's Disease  Provider:  Chilton Greathouse MD  Procedure: IV Infusion  IV Type: Peripheral, IV Location: R Antecubital  Remicade (Infliximab), Dose: 1000mg   Infusion Start Time: 1115  Infusion Stop Time: 1321  Post Infusion IV Care: Peripheral IV Discontinued  Discharge: Condition: Good, Destination: Home . AVS Declined  Performed by:  Loney Hering, LPN

## 2023-07-12 ENCOUNTER — Other Ambulatory Visit: Payer: Self-pay | Admitting: Family

## 2023-07-12 DIAGNOSIS — Z8679 Personal history of other diseases of the circulatory system: Secondary | ICD-10-CM

## 2023-07-12 NOTE — Telephone Encounter (Signed)
 Please contact pt and sent a follow up visit after 4/8.

## 2023-07-13 NOTE — Telephone Encounter (Signed)
 Lvm 2 sch.

## 2023-07-22 ENCOUNTER — Other Ambulatory Visit: Payer: Self-pay | Admitting: Family

## 2023-07-22 DIAGNOSIS — E119 Type 2 diabetes mellitus without complications: Secondary | ICD-10-CM

## 2023-08-01 ENCOUNTER — Ambulatory Visit (HOSPITAL_BASED_OUTPATIENT_CLINIC_OR_DEPARTMENT_OTHER)
Admission: RE | Admit: 2023-08-01 | Discharge: 2023-08-01 | Disposition: A | Discharge status: Home / Self Care | Source: Ambulatory Visit | Attending: Family Medicine | Admitting: Family Medicine

## 2023-08-01 ENCOUNTER — Ambulatory Visit (INDEPENDENT_AMBULATORY_CARE_PROVIDER_SITE_OTHER): Admitting: Family Medicine

## 2023-08-01 ENCOUNTER — Encounter: Payer: Self-pay | Admitting: Family Medicine

## 2023-08-01 ENCOUNTER — Ambulatory Visit: Payer: Self-pay | Admitting: *Deleted

## 2023-08-01 VITALS — BP 120/70 | HR 67 | Temp 98.4°F | Resp 20 | Ht 59.0 in | Wt 214.4 lb

## 2023-08-01 DIAGNOSIS — E119 Type 2 diabetes mellitus without complications: Secondary | ICD-10-CM | POA: Diagnosis not present

## 2023-08-01 DIAGNOSIS — M19072 Primary osteoarthritis, left ankle and foot: Secondary | ICD-10-CM | POA: Diagnosis not present

## 2023-08-01 DIAGNOSIS — Z7984 Long term (current) use of oral hypoglycemic drugs: Secondary | ICD-10-CM

## 2023-08-01 DIAGNOSIS — M25572 Pain in left ankle and joints of left foot: Secondary | ICD-10-CM

## 2023-08-01 DIAGNOSIS — M7989 Other specified soft tissue disorders: Secondary | ICD-10-CM | POA: Diagnosis not present

## 2023-08-01 DIAGNOSIS — M79605 Pain in left leg: Secondary | ICD-10-CM | POA: Diagnosis not present

## 2023-08-01 LAB — HEMOGLOBIN A1C: Hgb A1c MFr Bld: 7.1 % — ABNORMAL HIGH (ref 4.6–6.5)

## 2023-08-01 LAB — URIC ACID: Uric Acid, Serum: 8.9 mg/dL — ABNORMAL HIGH (ref 2.4–7.0)

## 2023-08-01 MED ORDER — ACETAMINOPHEN-CODEINE 300-30 MG PO TABS: 1.0000 | ORAL_TABLET | Freq: Three times a day (TID) | ORAL | 0 refills | Status: DC | PRN

## 2023-08-01 MED ORDER — COLCHICINE 0.6 MG PO TABS: ORAL_TABLET | ORAL | 0 refills | Status: DC

## 2023-08-01 NOTE — Telephone Encounter (Signed)
  Chief Complaint: left foot / ankle swelling pain 7-8/10, limping Symptoms: left foot and ankle swelling pain level 7-8/10. Not taking any medication for pain. Limping uses cane to walk and fearful of falling Frequency: 4 days ago  Pertinent Negatives: Patient denies chest pain no difficulty breathing no fever no redness reported Disposition: [] ED /[] Urgent Care (no appt availability in office) / [x] Appointment(In office/virtual)/ []  [place] [facility]/ [] Home Care/ [] Refused Recommended Disposition /[]  Mobile Bus/ []  Follow-up with PCP Additional Notes:   No available appt with PCP today . Able to schedule appt with other provider today.        Copied from CRM [id number]. Topic: Clinical - Red Word Triage >> Aug 01, 2023  8:36 AM Arley Phenix D wrote: Red Word that prompted transfer to Nurse Triage: Pain, Swollen  Patient stated that her left leg and ankle are in pain and swollen. Patient wanted to make an appointment to come into the office to be seen. Reason for Disposition  [1] SEVERE pain (e.g., excruciating, unable to walk) AND [2] not improved after 2 hours of pain medicine  Answer Assessment - Initial Assessment Questions 1. LOCATION: "Which ankle is swollen?" "Where is the swelling?"     Left ankle / foot  2. ONSET: "When did the swelling start?"     4 days ago  3. SWELLING: "How bad is the swelling?" Or, "How large is it?" (e.g., mild, moderate, severe; size of localized swelling)    - NONE: No joint swelling.   - LOCALIZED: Localized; small area of puffy or swollen skin (e.g., insect bite, skin irritation).   - MILD: Joint looks or feels mildly swollen or puffy.   - MODERATE: Swollen; interferes with normal activities (e.g., work or school); decreased range of movement; may be limping.   - SEVERE: Very swollen; can't move swollen joint at all; limping a lot or unable to walk.     Moderate limping  4. PAIN: "Is there any pain?" If Yes, ask: "How bad is it?" (Scale  1-10; or mild, moderate, severe)   - NONE (0): no pain.   - MILD (1-3): doesn't interfere with normal activities.    - MODERATE (4-7): interferes with normal activities (e.g., work or school) or awakens from sleep, limping.    - SEVERE (8-10): excruciating pain, unable to do any normal activities, unable to walk.      Pain 10-31-08 5. CAUSE: "What do you think caused the ankle swelling?"     na 6. OTHER SYMPTOMS: "Do you have any other symptoms?" (e.g., fever, chest pain, difficulty breathing, calf pain)     No  7. PREGNANCY: "Is there any chance you are pregnant?" "When was your last menstrual period?"     na  Protocols used: Ankle Swelling-A-AH

## 2023-08-01 NOTE — Patient Instructions (Addendum)
 It was good to see you today but I am sorry that your foot is hurting.  As we discussed, I suspect you have gout in your ankle.  We will get x-rays of your foot and ankle and also an ultrasound to make sure there is nothing else going on.  I will also check a uric acid level blood test-if this is high gout is more likely  We can go ahead and have you start on colchicine for presumed gout.  Take this according to the directions.  You can also use the Tylenol with codeine as needed for more severe pain.  Please bear in mind this will make you drowsy  Please go to lab and then stop by imaging on the ground floor

## 2023-08-01 NOTE — Progress Notes (Addendum)
 [facility] [address] [place], [place] [address] [phone]  Date:  08/01/2023   Name:  Kathy [name]   DOB:  1956-03-04   MRN:  [id number]  PCP:  Sandford Craze, NP    Chief Complaint: L ankle and foot swelling (X 4 days, no injuries. Taking Tylenol- min help )   History of Present Illness:  Kathy [name] is a 68 y.o. very pleasant female patient who presents with the following:  Patient seen today with LEFT ankle and foot pain and swelling.  Primary patient of Sandford Craze, I have not seen her myself previously  History of chronic kidney disease, Crohn's disease, GERD, hyperlipidemia, diabetes, hypertension  Lab Results  Component Value Date   HGBA1C 6.9 (H) 05/04/2023   At baseline she has some swelling of her bilateral feet and ankles.  However, she has noted increased swelling and pain in her left foot and ankle for the last 4 days She is not aware of any injury-no overuse or change in activity that she can recall It is getting worse  She is using cold compresses and tylenol -however this is not really helping  History of gout but none in the last 3 years She thinks this might remind her previous gout pain, however her previous gout has typically affected the great toe Wt Readings from Last 3 Encounters:  08/01/23 214 lb 6.4 oz (97.3 kg)  07/07/23 210 lb 9.6 oz (95.5 kg)  05/25/23 211 lb 9.6 oz (96 kg)     Patient Active Problem List   Diagnosis Date Noted   Gout 05/04/2023   Abnormal SPEP 11/06/2022   Fatigue 10/23/2022   Lower extremity edema 10/23/2022   Elevated serum protein level 10/23/2022   Uncontrolled type 2 diabetes mellitus with hyperglycemia (HCC) 07/06/2022   Irritable bowel syndrome with constipation 03/10/2022   Lumbar radiculopathy 10/28/2021   Chronic kidney disease, stage 3b (HCC) 07/09/2021   Controlled type 2 diabetes mellitus without complication,  without long-term current use of insulin (HCC) 07/09/2021   Hip pain 07/09/2021   B12 deficiency 01/05/2021   Healthcare maintenance 05/19/2018   Crohn disease (HCC) 03/09/2018   Diastolic dysfunction 03/09/2018   GERD (gastroesophageal reflux disease) 03/09/2018   FREQUENCY, URINARY 06/03/2009   Migraine without aura 07/17/2008   Microalbuminuria 06/01/2006   Hyperlipidemia 05/30/2006   Essential hypertension 05/30/2006   Asthma 05/30/2006    Past Medical History:  Diagnosis Date   Anal stenosis    Asthma    09-23-2017  per pt has not done inhaler since May 2018, no insurance (QVAR bid and Ventolin prn)   B12 deficiency 01/05/2021   Crohn disease (HCC)    Diastolic dysfunction    per echo 06-23-2009  grade 2 diastolic dysfunction , ef 60-65%   Esophagitis    GERD (gastroesophageal reflux disease)    Hemorrhoids    Hiatal hernia    Hyperlipidemia    stopped meds due to side effects   Hypertension    09-23-2017  per pt has takes bp meds since May 2018 no insurance (losartan 100mg  qd, HCTZ 12.5mg  qd, and toprol 50 qd)   Microalbuminuria 06/01/2006   Qualifier: Diagnosis of  By: Allena Katz MD, Sejal     Type 2 diabetes mellitus (HCC)    09-23-2017 per pt has not taken diabetic meds since May 2018, no insurance (kombiglyze 5-1000mg  qd)    Past Surgical History:  Procedure Laterality Date  BREAST BIOPSY Bilateral    CARDIOVASCULAR STRESS TEST  07/29/2009   normal nuclear study w/ no ischemia/  normal LV function and wall motion,  ef 70%   CESAREAN SECTION  1980s   RECTAL BIOPSY N/A 10/03/2017   Procedure: POSSIBLE BIOPSY RECTAL;  Surgeon: Andria Meuse, MD;  Location: [facility];  Service: General;  Laterality: N/A;    Social History   Tobacco Use   Smoking status: Never   Smokeless tobacco: Never  Vaping Use   Vaping status: Never Used  Substance Use Topics   Alcohol use: No   Drug use: No    Family History  Problem Relation Age of Onset   Hypertension Mother  12   Stroke Mother 75   Diabetes Maternal Aunt        s/p bilater amputation due to DM   Asthma Father    Colon cancer Neg Hx    Esophageal cancer Neg Hx    Stomach cancer Neg Hx    Rectal cancer Neg Hx     Allergies  Allergen Reactions   Metformin Nausea Only   Lipitor [Atorvastatin Calcium]     Leg cramps    Lisinopril Itching   Pravachol [Pravastatin Sodium]    Pravastatin Sodium Other (See Comments)    leg cramps    Medication list has been reviewed and updated.  Current Outpatient Medications on File Prior to Visit  Medication Sig Dispense Refill   albuterol (VENTOLIN HFA) 108 (90 Base) MCG/ACT inhaler Inhale 1 puff into the lungs every 6 (six) hours as needed for wheezing or shortness of breath. 18 g 5   amLODipine (NORVASC) 10 MG tablet Take 1 tablet by mouth once daily 90 tablet 0   azaTHIOprine (IMURAN) 50 MG tablet Take 2 tablets (100 mg total) by mouth daily. Please call [phone] to schedule an office visit for more refills 60 tablet 1   clotrimazole-betamethasone (LOTRISONE) cream Apply 1 application topically 2 (two) times daily. 30 g 1   Continuous Glucose Receiver (FREESTYLE LIBRE 3 READER) DEVI Use as directed to monitor blood sugar 1 each 0   Continuous Glucose Sensor (FREESTYLE LIBRE 3 SENSOR) MISC Use as directed to monitor blood sugar. Change every 14 days 2 each 5   dicyclomine (BENTYL) 10 MG capsule TAKE 1 CAPSULE BY MOUTH THREE TIMES DAILY AS NEEDED FOR  CRAMPING 90 capsule 2   glipiZIDE (GLUCOTROL XL) 10 MG 24 hr tablet Take 1 tablet by mouth once daily with breakfast 90 tablet 0   hydrochlorothiazide (MICROZIDE) 12.5 MG capsule Take 1 capsule by mouth once daily 90 capsule 0   hydrocortisone (ANUSOL-HC) 25 MG suppository Place 1 suppository (25 mg total) rectally at bedtime. 14 suppository 0   influenza vaccine adjuvanted (FLUAD) 0.5 ML injection Inject into the muscle. 0.5 mL 0   Iron, Ferrous Sulfate, 325 (65 Fe) MG TABS Take 325 mg by mouth every  other day.     JANUVIA 50 MG tablet Take 1 tablet by mouth once daily 90 tablet 0   lubiprostone (AMITIZA) 8 MCG capsule TAKE 1 CAPSULE BY MOUTH TWICE DAILY WITH A MEAL . KEEP UPCOMING APPOINTMENT FOR FUTURE REFILLS 60 capsule 3   metoprolol succinate (TOPROL-XL) 50 MG 24 hr tablet TAKE 1 TABLET BY MOUTH ONCE DAILY WITH FOOD OR IMMEDIATELY FOLLOWING A MEAL 90 tablet 0   Multiple Vitamins-Minerals (MULTIVITAMIN WITH MINERALS) tablet Take 1 tablet by mouth daily.     omeprazole (PRILOSEC) 40 MG capsule TAKE 1 CAPSULE  BY MOUTH ONCE DAILY**NEED APPOINTMENT FOR FUTURE REFILLS** 90 capsule 0   polyethylene glycol (MIRALAX / GLYCOLAX) 17 g packet Take 17 g by mouth as needed. 1 capful prn 30 each 11   REMICADE 100 MG injection INFUSE 1000MG  IV EVERY 6 WEEKS 10 mL 4   simvastatin (ZOCOR) 10 MG tablet Take 1 tablet by mouth once daily 90 tablet 0   valsartan (DIOVAN) 40 MG tablet Take 1 tablet (40 mg total) by mouth daily. 90 tablet 0   colchicine 0.6 MG tablet Take 2 tabs by mouth now and 1 tab in 1 hour. (Patient not taking: Reported on 08/01/2023) 6 tablet 1   No current facility-administered medications on file prior to visit.    Review of Systems:  As per HPI- otherwise negative.   Physical Examination: Vitals:   08/01/23 1022 08/01/23 1051  BP: (!) 142/52 120/70  Pulse: 67   Resp: 20   Temp: 98.4 F (36.9 C)   SpO2: 100%    Vitals:   08/01/23 1022  Weight: 214 lb 6.4 oz (97.3 kg)  Height: 4\' 11"  (1.499 m)   Body mass index is 43.3 kg/m. Ideal Body Weight: Weight in (lb) to have BMI = 25: 123.5  GEN: no acute distress.  Obese, looks well HEENT: Atraumatic, Normocephalic.  Ears and Nose: No external deformity. CV: RRR, No M/G/R. No JVD. No thrill. No extra heart sounds. PULM: CTA B, no wheezes, crackles, rhonchi. No retractions. No resp. distress. No accessory muscle use. EXTR: No c/c/e PSYCH: Normally interactive. Conversant.  Patient has the degree of swelling seen in her  right foot and ankle is her baseline.  There is some worsening of the swelling in her left foot and ankle.  No calf pain or tenderness.  No redness, no heat.  She has maximal tenderness around the anterior ankle joint, I suspect this is likely gout. I am not able to palpate pulses in the dorsum of either foot likely due to swelling, though her feet are warm and well-perfused.  She notes normal sensation of her toes      Assessment and Plan: Acute left ankle pain - Plan: DG Foot Complete Left, DG Ankle Complete Left, US Venous Img Lower Unilateral Left, colchicine 0.6 MG tablet, acetaminophen-codeine (TYLENOL #3) 300-30 MG tablet, Uric acid  Controlled type 2 diabetes mellitus without complication, without long-term current use of insulin (HCC) - Plan: Hemoglobin A1c Patient seen today with pain mostly in her anterior left ankle.  She has history of gout, I suspect this is again a gout attack.  Will obtain x-rays and ultrasound to rule out DVT.  Will have her start on colchicine and gave her some Tylenol with codeine to use as needed.  Will check a uric acid level  I will be in touch with her pending her results, asked her to please keep me posted  Signed Abbe Amsterdam, MD  Received her imaging results and sent message to patienti  DG Foot Complete Left Result Date: 08/01/2023 CLINICAL DATA:  Left foot and ankle pain and swelling for 4 days. No known injury. EXAM: LEFT FOOT - COMPLETE 3+ VIEW; LEFT ANKLE COMPLETE - 3+ VIEW COMPARISON:  None available FINDINGS: Left foot: Mild degenerative changes of the first metatarsophalangeal joint. Mild diffuse swelling of the dorsal soft tissues. Left ankle: Mild diffuse circumferential soft tissue swelling. No fracture or dislocation. IMPRESSION: Mild soft tissue swelling of the dorsum of the foot and ankle without underlying fracture or dislocation. Electronically Signed  By: Mauri Reading  Mir M.D.   On: 08/01/2023 12:08   DG Ankle Complete Left Result  Date: 08/01/2023 CLINICAL DATA:  Left foot and ankle pain and swelling for 4 days. No known injury. EXAM: LEFT FOOT - COMPLETE 3+ VIEW; LEFT ANKLE COMPLETE - 3+ VIEW COMPARISON:  None available FINDINGS: Left foot: Mild degenerative changes of the first metatarsophalangeal joint. Mild diffuse swelling of the dorsal soft tissues. Left ankle: Mild diffuse circumferential soft tissue swelling. No fracture or dislocation. IMPRESSION: Mild soft tissue swelling of the dorsum of the foot and ankle without underlying fracture or dislocation. Electronically Signed   By: Acquanetta Belling M.D.   On: 08/01/2023 12:08   US Venous Img Lower Unilateral Left Result Date: 08/01/2023 CLINICAL DATA:  Left lower extremity pain and swelling for 4 days EXAM: LEFT LOWER EXTREMITY VENOUS DOPPLER ULTRASOUND TECHNIQUE: Gray-scale sonography with compression, as well as color and duplex ultrasound, were performed to evaluate the deep venous system(s) from the level of the common femoral vein through the popliteal and proximal calf veins. COMPARISON:  None available FINDINGS: VENOUS Normal compressibility of the common femoral, superficial femoral, and popliteal veins, as well as the visualized calf veins. Visualized portions of profunda femoral vein and great saphenous vein unremarkable. No filling defects to suggest DVT on grayscale or color Doppler imaging. Doppler waveforms show normal direction of venous flow, normal respiratory plasticity and response to augmentation. Limited views of the contralateral common femoral vein are unremarkable. OTHER None. Limitations: none IMPRESSION: No DVT of the left lower extremity. Electronically Signed   By: Acquanetta Belling M.D.   On: 08/01/2023 12:06   Received labs as below, message to patient Results for orders placed or performed in visit on 08/01/23  Uric acid   Collection Time: 08/01/23 10:57 AM  Result Value Ref Range   Uric Acid, Serum 8.9 (H) 2.4 - 7.0 mg/dL  Hemoglobin B1Y   Collection  Time: 08/01/23 10:57 AM  Result Value Ref Range   Hgb A1c MFr Bld 7.1 (H) 4.6 - 6.5 %

## 2023-08-04 ENCOUNTER — Other Ambulatory Visit: Payer: Self-pay | Admitting: Family

## 2023-08-11 ENCOUNTER — Other Ambulatory Visit: Payer: Self-pay | Admitting: Family

## 2023-08-11 DIAGNOSIS — I10 Essential (primary) hypertension: Secondary | ICD-10-CM

## 2023-08-18 ENCOUNTER — Ambulatory Visit (INDEPENDENT_AMBULATORY_CARE_PROVIDER_SITE_OTHER)

## 2023-08-18 VITALS — BP 106/57 | HR 60 | Temp 98.0°F | Resp 18 | Ht 59.0 in | Wt 213.8 lb

## 2023-08-18 DIAGNOSIS — K50111 Crohn's disease of large intestine with rectal bleeding: Secondary | ICD-10-CM

## 2023-08-18 MED ORDER — ACETAMINOPHEN 325 MG PO TABS
650.0000 mg | ORAL_TABLET | Freq: Once | ORAL | Status: AC
  Administered 2023-08-18: 650 mg via ORAL
  Filled 2023-08-18: qty 2

## 2023-08-18 MED ORDER — SODIUM CHLORIDE 0.9 % IV SOLN
10.0000 mg/kg | Freq: Once | INTRAVENOUS | Status: AC
  Administered 2023-08-18: 1000 mg via INTRAVENOUS
  Filled 2023-08-18: qty 100

## 2023-08-18 MED ORDER — DIPHENHYDRAMINE HCL 25 MG PO CAPS
25.0000 mg | ORAL_CAPSULE | Freq: Once | ORAL | Status: AC
  Administered 2023-08-18: 25 mg via ORAL
  Filled 2023-08-18: qty 1

## 2023-08-18 NOTE — Progress Notes (Signed)
 Diagnosis: Crohn's Disease  Provider:  Praveen Mannam MD  Procedure: IV Infusion  IV Type: Peripheral, IV Location: L Antecubital  Remicade  (Infliximab ), Dose: 1000 mg  Infusion Start Time: 1117  Infusion Stop Time: 1337  Post Infusion IV Care: Peripheral IV Discontinued  Discharge: Condition: Good, Destination: Home . AVS Declined  Performed by:  Darrold Bezek, RN

## 2023-08-24 ENCOUNTER — Ambulatory Visit (INDEPENDENT_AMBULATORY_CARE_PROVIDER_SITE_OTHER): Admitting: Family

## 2023-08-24 VITALS — BP 126/46 | HR 58 | Temp 97.6°F | Resp 16 | Ht 59.0 in | Wt 209.0 lb

## 2023-08-24 DIAGNOSIS — I10 Essential (primary) hypertension: Secondary | ICD-10-CM

## 2023-08-24 DIAGNOSIS — E538 Deficiency of other specified B group vitamins: Secondary | ICD-10-CM

## 2023-08-24 DIAGNOSIS — K50111 Crohn's disease of large intestine with rectal bleeding: Secondary | ICD-10-CM

## 2023-08-24 DIAGNOSIS — M109 Gout, unspecified: Secondary | ICD-10-CM

## 2023-08-24 DIAGNOSIS — J45909 Unspecified asthma, uncomplicated: Secondary | ICD-10-CM | POA: Diagnosis not present

## 2023-08-24 DIAGNOSIS — E1165 Type 2 diabetes mellitus with hyperglycemia: Secondary | ICD-10-CM

## 2023-08-24 DIAGNOSIS — M2550 Pain in unspecified joint: Secondary | ICD-10-CM | POA: Diagnosis not present

## 2023-08-24 DIAGNOSIS — N1832 Chronic kidney disease, stage 3b: Secondary | ICD-10-CM | POA: Diagnosis not present

## 2023-08-24 LAB — MICROALBUMIN / CREATININE URINE RATIO
Creatinine,U: 300.5 mg/dL
Microalb Creat Ratio: 46 mg/g — ABNORMAL HIGH (ref 0.0–30.0)
Microalb, Ur: 13.8 mg/dL — ABNORMAL HIGH (ref 0.0–1.9)

## 2023-08-24 LAB — BASIC METABOLIC PANEL WITH GFR
BUN: 15 mg/dL (ref 6–23)
CO2: 30 meq/L (ref 19–32)
Calcium: 9.8 mg/dL (ref 8.4–10.5)
Chloride: 99 meq/L (ref 96–112)
Creatinine, Ser: 1.6 mg/dL — ABNORMAL HIGH (ref 0.40–1.20)
GFR: 32.57 mL/min — ABNORMAL LOW (ref >60.00)
Glucose, Bld: 186 mg/dL — ABNORMAL HIGH (ref 70–99)
Potassium: 4.1 meq/L (ref 3.5–5.1)
Sodium: 139 meq/L (ref 135–145)

## 2023-08-24 LAB — VITAMIN B12: Vitamin B-12: 516 pg/mL (ref 211–911)

## 2023-08-24 MED ORDER — ALLOPURINOL 100 MG PO TABS: 100.0000 mg | ORAL_TABLET | Freq: Every day | ORAL | 6 refills | Status: DC

## 2023-08-24 MED ORDER — VITAMIN B-12 1000 MCG PO TABS
1000.0000 ug | ORAL_TABLET | Freq: Every day | ORAL | Status: AC
Start: 2023-08-24 — End: ?

## 2023-08-24 NOTE — Assessment & Plan Note (Signed)
 She really wants to limit the number of meds she is on. I advised her that she could trial coming off of the hydrochlorothiazide  12.5mg  but would need to restart if bp starts to run >140/90 or if LE edema worsens.  Will continue amlodipine , diovan , metoprolol .

## 2023-08-24 NOTE — Assessment & Plan Note (Signed)
Taking otc b12  PO daily. Update level.

## 2023-08-24 NOTE — Assessment & Plan Note (Signed)
Continues to follow with Nephrology.  ?

## 2023-08-24 NOTE — Assessment & Plan Note (Signed)
 Lab Results  Component Value Date   HGBA1C 7.1 (H) 08/01/2023   HGBA1C 6.9 (H) 05/04/2023   HGBA1C 6.8 (H) 01/24/2023   Lab Results  Component Value Date   MICROALBUR 9.5 (H) 07/06/2022   LDLCALC 60 01/24/2023   CREATININE 1.34 (H) 05/04/2023

## 2023-08-24 NOTE — Assessment & Plan Note (Signed)
 Check ANA/Rheumatoid factor. We discussed that simvastatin  can cause joint pain and myalgias.  I suggested that she come off of simvastatin  for a few weeks to see if this helps her symptoms.  She is advised to let me know if this is helpful.

## 2023-08-24 NOTE — Patient Instructions (Addendum)
 Please stop simvastatin  for a few weeks and let me know if your muscle and joint pain improves. Add allopurinol once daily for gout prevention.

## 2023-08-24 NOTE — Assessment & Plan Note (Signed)
 I honestly think that her crohn's could be the culprit for many of her symptoms.  I don't see that she has had an ANA or Rheumatoid factor though so I would like to rule out other autoimmune etiology.

## 2023-08-24 NOTE — Progress Notes (Signed)
 Subjective:     Patient ID: Kathy [name], female    DOB: 1956-01-29, 68 y.o.   MRN: [id number]  Chief Complaint  Patient presents with   Hypertension    Here for follow up   Diabetes    Here for follow up    Generalized Body Aches    Pt complains of pain throught for over a month     HPI  Discussed the use of AI scribe software for clinical note transcription with the patient, who gave verbal consent to proceed.  History of Present Illness  Patient is a 68 yr old female who presents today for follow up.  She notes a general malaise, abdominal bloating, joint pain and fatigue. Has not felt motivated to cook but is managing to complete other ADL's. She states she is upset about the number of medications she is on and she thinks that her medications are causing her symptoms.    Notes recent gout flare and is inquiring about a preventative gout medication. She does have colchicine  on hand for prn use.     Health Maintenance Due  Topic Date Due   Medicare Annual Wellness (AWV)  08/13/2023    Past Medical History:  Diagnosis Date   Anal stenosis    Asthma    09-23-2017  per pt has not done inhaler since May 2018, no insurance (QVAR bid and Ventolin  prn)   B12 deficiency 01/05/2021   Crohn disease (HCC)    Diastolic dysfunction    per echo 06-23-2009  grade 2 diastolic dysfunction , ef 60-65%   Esophagitis    GERD (gastroesophageal reflux disease)    Hemorrhoids    Hiatal hernia    Hyperlipidemia    stopped meds due to side effects   Hypertension    09-23-2017  per pt has takes bp meds since May 2018 no insurance (losartan 100mg  qd, HCTZ 12.5mg  qd, and toprol  50 qd)   Microalbuminuria 06/01/2006   Qualifier: Diagnosis of  By: Lydia Sams MD, Sejal     Type 2 diabetes mellitus (HCC)    09-23-2017 per pt has not taken diabetic meds since May 2018, no insurance (kombiglyze 5-1000mg  qd)    Past Surgical History:  Procedure Laterality Date   BREAST BIOPSY Bilateral     CARDIOVASCULAR STRESS TEST  07/29/2009   normal nuclear study w/ no ischemia/  normal LV function and wall motion,  ef 70%   CESAREAN SECTION  1980s   RECTAL BIOPSY N/A 10/03/2017   Procedure: POSSIBLE BIOPSY RECTAL;  Surgeon: Melvenia Stabs, MD;  Location: [facility];  Service: General;  Laterality: N/A;    Family History  Problem Relation Age of Onset   Hypertension Mother 33   Stroke Mother 61   Diabetes Maternal Aunt        s/p bilater amputation due to DM   Asthma Father    Colon cancer Neg Hx    Esophageal cancer Neg Hx    Stomach cancer Neg Hx    Rectal cancer Neg Hx     Social History   Socioeconomic History   Marital status: Married    Spouse name: Not on file   Number of children: 2   Years of education: Not on file   Highest education level: Not on file  Occupational History   Occupation: unemployed  Tobacco Use   Smoking status: Never   Smokeless tobacco: Never  Vaping Use   Vaping status: Never Used  Substance and Sexual Activity  Alcohol use: No   Drug use: No   Sexual activity: Not Currently  Other Topics Concern   Not on file  Social History Narrative   Not working currently. Retired in 2019. Previously worked in [employer] job   Married    2 children (son and daughter). They are both local   She has 1 grand-daughter and 1 grand-son   No pets (allergic)   Completed 4 years of college.   Social Drivers of Corporate investment banker Strain: Low Risk  (08/13/2022)   Overall Financial Resource Strain (CARDIA)    Difficulty of Paying Living Expenses: Not hard at all  Food Insecurity: No Food Insecurity (11/24/2022)   Hunger Vital Sign    Worried About Running Out of Food in the Last Year: Never true    Ran Out of Food in the Last Year: Never true  Transportation Needs: No Transportation Needs (11/24/2022)   PRAPARE - Administrator, Civil Service (Medical): No    Lack of Transportation (Non-Medical): No  Physical Activity: Inactive  (08/13/2022)   Exercise Vital Sign    Days of Exercise per Week: 0 days    Minutes of Exercise per Session: 0 min  Stress: No Stress Concern Present (08/13/2022)   [employer] of Occupational Health - Occupational Stress Questionnaire    Feeling of Stress : Only a little  Social Connections: Moderately Integrated (08/13/2022)   Social Connection and Isolation Panel [NHANES]    Frequency of Communication with Friends and Family: More than three times a week    Frequency of Social Gatherings with Friends and Family: Twice a week    Attends Religious Services: More than 4 times per year    Active Member of [employer] or Organizations: No    Attends Banker Meetings: Never    Marital Status: Married  Catering manager Violence: Not At Risk (11/24/2022)   Humiliation, Afraid, Rape, and Kick questionnaire    Fear of Current or Ex-Partner: No    Emotionally Abused: No    Physically Abused: No    Sexually Abused: No    Outpatient Medications Prior to Visit  Medication Sig Dispense Refill   albuterol  (VENTOLIN  HFA) 108 (90 Base) MCG/ACT inhaler Inhale 1 puff into the lungs every 6 (six) hours as needed for wheezing or shortness of breath. 18 g 5   amLODipine  (NORVASC ) 10 MG tablet Take 1 tablet by mouth once daily 90 tablet 0   azaTHIOprine  (IMURAN ) 50 MG tablet Take 2 tablets (100 mg total) by mouth daily. Please call [phone] to schedule an office visit for more refills 60 tablet 1   clotrimazole -betamethasone  (LOTRISONE ) cream Apply 1 application topically 2 (two) times daily. 30 g 1   colchicine  0.6 MG tablet Take 2 tabs by mouth now and 1 tab in 1 hour. 6 tablet 1   colchicine  0.6 MG tablet Take 1.2 mg once, then 0.6 mg an hour later.  May take 1 tablet daily for 2-3 more days if needed for gout pain 15 tablet 0   Continuous Glucose Receiver (FREESTYLE LIBRE 3 READER) DEVI Use as directed to monitor blood sugar 1 each 0   Continuous Glucose Sensor (FREESTYLE LIBRE 3 SENSOR)  MISC Use as directed to monitor blood sugar. Change every 14 days 2 each 5   dicyclomine  (BENTYL ) 10 MG capsule TAKE 1 CAPSULE BY MOUTH THREE TIMES DAILY AS NEEDED FOR  CRAMPING 90 capsule 2   glipiZIDE  (GLUCOTROL  XL) 10 MG 24  hr tablet Take 1 tablet by mouth once daily with breakfast 90 tablet 0   hydrochlorothiazide  (MICROZIDE ) 12.5 MG capsule Take 1 capsule by mouth once daily 90 capsule 0   hydrocortisone  (ANUSOL -HC) 25 MG suppository Place 1 suppository (25 mg total) rectally at bedtime. 14 suppository 0   Iron , Ferrous Sulfate , 325 (65 Fe) MG TABS Take 325 mg by mouth every other day.     lubiprostone  (AMITIZA ) 8 MCG capsule TAKE 1 CAPSULE BY MOUTH TWICE DAILY WITH A MEAL . KEEP UPCOMING APPOINTMENT FOR FUTURE REFILLS 60 capsule 3   metoprolol  succinate (TOPROL -XL) 50 MG 24 hr tablet Take 1 tablet (50 mg total) by mouth daily. Take with or immediately following a meal 90 tablet 0   Multiple Vitamins-Minerals (MULTIVITAMIN WITH MINERALS) tablet Take 1 tablet by mouth daily.     omeprazole  (PRILOSEC) 40 MG capsule TAKE 1 CAPSULE BY MOUTH ONCE DAILY**NEED APPOINTMENT FOR FUTURE REFILLS** 90 capsule 0   polyethylene glycol (MIRALAX  / GLYCOLAX ) 17 g packet Take 17 g by mouth as needed. 1 capful prn 30 each 11   REMICADE  100 MG injection INFUSE 1000MG  IV EVERY 6 WEEKS 10 mL 4   simvastatin  (ZOCOR ) 10 MG tablet Take 1 tablet by mouth once daily 90 tablet 0   sitaGLIPtin  (JANUVIA ) 50 MG tablet Take 1 tablet (50 mg total) by mouth daily. 90 tablet 0   valsartan  (DIOVAN ) 40 MG tablet Take 1 tablet (40 mg total) by mouth daily. 90 tablet 0   acetaminophen -codeine  (TYLENOL  #3) 300-30 MG tablet Take 1 tablet by mouth every 8 (eight) hours as needed for moderate pain (pain score 4-6). 20 tablet 0   influenza vaccine adjuvanted (FLUAD) 0.5 ML injection Inject into the muscle. 0.5 mL 0   No facility-administered medications prior to visit.    Allergies  Allergen Reactions   Metformin Nausea Only    Lipitor [Atorvastatin Calcium]     Leg cramps    Lisinopril Itching   Pravachol [Pravastatin Sodium]    Pravastatin Sodium Other (See Comments)    leg cramps    ROS    See HPI  Objective:    Physical Exam Constitutional:      General: She is not in acute distress.    Appearance: Normal appearance. She is well-developed.  HENT:     Head: Normocephalic and atraumatic.     Right Ear: External ear normal.     Left Ear: External ear normal.  Eyes:     General: No scleral icterus. Neck:     Thyroid : No thyromegaly.  Cardiovascular:     Rate and Rhythm: Normal rate and regular rhythm.     Heart sounds: Normal heart sounds. No murmur heard. Pulmonary:     Effort: Pulmonary effort is normal. No respiratory distress.     Breath sounds: Normal breath sounds. No wheezing.  Musculoskeletal:     Cervical back: Neck supple.  Skin:    General: Skin is warm and dry.  Neurological:     Mental Status: She is alert and oriented to person, place, and time.  Psychiatric:        Mood and Affect: Mood normal.        Behavior: Behavior normal.        Thought Content: Thought content normal.        Judgment: Judgment normal.      BP (!) 126/46 (BP Location: Right Arm, Patient Position: Sitting, Cuff Size: Large)   Pulse (!) 58   Temp  97.6 F (36.4 C) (Oral)   Resp 16   Ht 4\' 11"  (1.499 m)   Wt 209 lb (94.8 kg)   SpO2 100%   BMI 42.21 kg/m  Wt Readings from Last 3 Encounters:  08/24/23 209 lb (94.8 kg)  08/18/23 213 lb 12.8 oz (97 kg)  08/01/23 214 lb 6.4 oz (97.3 kg)       Assessment & Plan:   Problem List Items Addressed This Visit       Unprioritized   Essential hypertension (Chronic)   She really wants to limit the number of meds she is on. I advised her that she could trial coming off of the hydrochlorothiazide  12.5mg  but would need to restart if bp starts to run >140/90 or if LE edema worsens.  Will continue amlodipine , diovan , metoprolol .       Uncontrolled type  2 diabetes mellitus with hyperglycemia (HCC)   Lab Results  Component Value Date   HGBA1C 7.1 (H) 08/01/2023   HGBA1C 6.9 (H) 05/04/2023   HGBA1C 6.8 (H) 01/24/2023   Lab Results  Component Value Date   MICROALBUR 9.5 (H) 07/06/2022   LDLCALC 60 01/24/2023   CREATININE 1.34 (H) 05/04/2023         Relevant Orders   Basic Metabolic Panel (BMET)   AMB Referral [facility] Care Management   Urine Microalbumin w/creat. ratio   Gout   Uncontrolled. Continue prn colchicine , trial of allopurinol once daily for prevention.      Relevant Medications   allopurinol (ZYLOPRIM) 100 MG tablet   Crohn disease (HCC)   I honestly think that her crohn's could be the culprit for many of her symptoms.  I don't see that she has had an ANA or Rheumatoid factor though so I would like to rule out other autoimmune etiology.       Chronic kidney disease, stage 3b (HCC)   Continues to follow with Nephrology.       B12 deficiency   Taking otc b12 1000mcg PO daily. Update level.       Relevant Medications   cyanocobalamin  (VITAMIN B12) 1000 MCG tablet   Other Relevant Orders   B12   Asthma   Stable with prn albuterol .       Arthralgia - Primary   Check ANA/Rheumatoid factor. We discussed that simvastatin  can cause joint pain and myalgias.  I suggested that she come off of simvastatin  for a few weeks to see if this helps her symptoms.  She is advised to let me know if this is helpful.        Relevant Orders   Antinuclear Antib (ANA)   Rheumatoid Factor   40 minutes spent on today's visit. Time was spent reviewing medical record and counseling patient closely on the indication and mechanism of action for each medication on her list.  I asked her if I had answered her questions about her medications and she said that she was still not satisfied.  I suggested that we set up an appointment with our pharmacist for further education and possibly for medication packaging to make adherence easier for her.    I have discontinued Lissie Smith-Milliken's Fluad and acetaminophen -codeine . I am also having her start on cyanocobalamin  and allopurinol. Additionally, I am having her maintain her polyethylene glycol, clotrimazole -betamethasone , hydrocortisone , multivitamin with minerals, colchicine , albuterol , dicyclomine , Iron  (Ferrous Sulfate ), lubiprostone , FreeStyle Libre 3 Reader, FreeStyle Libre 3 Sensor, Remicade , azaTHIOprine , amLODipine , valsartan , omeprazole , simvastatin , hydrochlorothiazide , glipiZIDE , colchicine , sitaGLIPtin , and metoprolol  succinate.  Meds ordered this  encounter  Medications   cyanocobalamin  (VITAMIN B12) 1000 MCG tablet    Sig: Take 1 tablet (1,000 mcg total) by mouth daily.    Supervising Provider:   Randie Bustle A [[id number]]   allopurinol (ZYLOPRIM) 100 MG tablet    Sig: Take 1 tablet (100 mg total) by mouth daily.    Dispense:  30 tablet    Refill:  6    Supervising Provider:   Randie Bustle A [[id number]]

## 2023-08-24 NOTE — Assessment & Plan Note (Signed)
Stable with prn albuterol.  

## 2023-08-24 NOTE — Assessment & Plan Note (Signed)
 Uncontrolled. Continue prn colchicine , trial of allopurinol once daily for prevention.

## 2023-08-25 ENCOUNTER — Telehealth: Payer: Self-pay

## 2023-08-25 NOTE — Telephone Encounter (Signed)
 Warning from pharmacy

## 2023-08-25 NOTE — Telephone Encounter (Signed)
 Please advise pt that the pharmacist is advising against the addition of allopurinol  due to a possible interaction with one of her other meds.  I would recommend that she not add allopurinol  (please d/c at pharmacy) and instead.  I would instead recommend that she focus on a gout prevention diet.  I will add this information to her AVS from her last visit.

## 2023-08-25 NOTE — Addendum Note (Signed)
 Addended by: Dorrene Gaucher on: 08/25/2023 01:52 PM   Modules accepted: Orders

## 2023-08-25 NOTE — Progress Notes (Unsigned)
 Complex Care Management Note Care Guide Note  08/25/2023 Name: Kathy [name] MRN: [id number] DOB: [birth year]   Complex Care Management Outreach Attempts: An unsuccessful telephone outreach was attempted today to offer the patient information about available complex care management services.  Follow Up Plan:  Additional outreach attempts will be made to offer the patient complex care management information and services.   Encounter Outcome:  No Answer  [facility]  [facility], [facility] Health Care Management Assistant Direct Dial: [phone]  Fax: [phone]

## 2023-08-25 NOTE — Telephone Encounter (Signed)
 Prescription for allopurinol  cancelled at [facility].  Patient notified of interaction, cancellation and advise to manage gout with diet as per pcp. She verbalized understanding

## 2023-08-26 ENCOUNTER — Encounter: Payer: Self-pay | Admitting: Family

## 2023-08-26 LAB — RHEUMATOID FACTOR: Rheumatoid fact SerPl-aCnc: <10 [IU]/mL (ref <14)

## 2023-08-26 LAB — ANA: Anti Nuclear Antibody (ANA): NEGATIVE

## 2023-08-29 NOTE — Progress Notes (Unsigned)
 Complex Care Management Note Care Guide Note  08/29/2023 Name: Kathy [name] MRN: [id number] DOB: 10/04/1955   Complex Care Management Outreach Attempts: A second unsuccessful outreach was attempted today to offer the patient with information about available complex care management services.  Follow Up Plan:  Additional outreach attempts will be made to offer the patient complex care management information and services.   Encounter Outcome:  No Answer  [facility]  [facility], [facility] Health Care Management Assistant Direct Dial: [phone]  Fax: [phone]

## 2023-08-31 ENCOUNTER — Other Ambulatory Visit (INDEPENDENT_AMBULATORY_CARE_PROVIDER_SITE_OTHER)

## 2023-08-31 ENCOUNTER — Encounter: Payer: Self-pay | Admitting: Internal Medicine

## 2023-08-31 ENCOUNTER — Ambulatory Visit: Payer: PPO | Admitting: Internal Medicine

## 2023-08-31 ENCOUNTER — Telehealth: Payer: Self-pay | Admitting: Family

## 2023-08-31 VITALS — BP 118/60 | HR 65 | Ht 59.0 in | Wt 208.2 lb

## 2023-08-31 DIAGNOSIS — K50119 Crohn's disease of large intestine with unspecified complications: Secondary | ICD-10-CM

## 2023-08-31 DIAGNOSIS — M109 Gout, unspecified: Secondary | ICD-10-CM

## 2023-08-31 DIAGNOSIS — K2100 Gastro-esophageal reflux disease with esophagitis, without bleeding: Secondary | ICD-10-CM

## 2023-08-31 DIAGNOSIS — K5909 Other constipation: Secondary | ICD-10-CM

## 2023-08-31 DIAGNOSIS — K624 Stenosis of anus and rectum: Secondary | ICD-10-CM | POA: Diagnosis not present

## 2023-08-31 DIAGNOSIS — N1832 Chronic kidney disease, stage 3b: Secondary | ICD-10-CM

## 2023-08-31 LAB — COMPREHENSIVE METABOLIC PANEL WITH GFR
ALT: 8 U/L (ref 0–35)
AST: 11 U/L (ref 0–37)
Albumin: 4 g/dL (ref 3.5–5.2)
Alkaline Phosphatase: 111 U/L (ref 39–117)
BUN: 22 mg/dL (ref 6–23)
CO2: 29 meq/L (ref 19–32)
Calcium: 9.7 mg/dL (ref 8.4–10.5)
Chloride: 100 meq/L (ref 96–112)
Creatinine, Ser: 1.69 mg/dL — ABNORMAL HIGH (ref 0.40–1.20)
GFR: 30.5 mL/min — ABNORMAL LOW (ref >60.00)
Glucose, Bld: 135 mg/dL — ABNORMAL HIGH (ref 70–99)
Potassium: 4.4 meq/L (ref 3.5–5.1)
Sodium: 136 meq/L (ref 135–145)
Total Bilirubin: 0.4 mg/dL (ref 0.2–1.2)
Total Protein: 8.6 g/dL — ABNORMAL HIGH (ref 6.0–8.3)

## 2023-08-31 LAB — CBC WITH DIFFERENTIAL/PLATELET
Basophils Absolute: 0 10*3/uL (ref 0.0–0.1)
Basophils Relative: 0.9 % (ref 0.0–3.0)
Eosinophils Absolute: 0.1 10*3/uL (ref 0.0–0.7)
Eosinophils Relative: 2.2 % (ref 0.0–5.0)
HCT: 36.1 % (ref 36.0–46.0)
Hemoglobin: 11.1 g/dL — ABNORMAL LOW (ref 12.0–15.0)
Lymphocytes Relative: 14.8 % (ref 12.0–46.0)
Lymphs Abs: 0.7 10*3/uL (ref 0.7–4.0)
MCHC: 30.7 g/dL (ref 30.0–36.0)
MCV: 69.8 fl — ABNORMAL LOW (ref 78.0–100.0)
Monocytes Absolute: 0.4 10*3/uL (ref 0.1–1.0)
Monocytes Relative: 8.4 % (ref 3.0–12.0)
Neutro Abs: 3.6 10*3/uL (ref 1.4–7.7)
Neutrophils Relative %: 73.7 % (ref 43.0–77.0)
Platelets: 407 10*3/uL — ABNORMAL HIGH (ref 150.0–400.0)
RBC: 5.18 Mil/uL — ABNORMAL HIGH (ref 3.87–5.11)
RDW: 17.2 % — ABNORMAL HIGH (ref 11.5–15.5)
WBC: 4.9 10*3/uL (ref 4.0–10.5)

## 2023-08-31 MED ORDER — LUBIPROSTONE 24 MCG PO CAPS: 24.0000 ug | ORAL_CAPSULE | Freq: Two times a day (BID) | ORAL | 11 refills | Status: DC

## 2023-08-31 MED ORDER — AZATHIOPRINE 50 MG PO TABS: 100.0000 mg | ORAL_TABLET | Freq: Every day | ORAL | 1 refills | Status: DC

## 2023-08-31 MED ORDER — OMEPRAZOLE 40 MG PO CPDR: DELAYED_RELEASE_CAPSULE | ORAL | 3 refills | Status: AC

## 2023-08-31 NOTE — Progress Notes (Signed)
 Subjective:    Patient ID: Kathy [name], female    DOB: 16-Oct-1955, 68 y.o.   MRN: [id number]  HPI Kathy [name] is a 68 year old female with a history of Crohn's disease of the colon and anorectum with prior fistula on infliximab  (started April 2022), anal stenosis secondary to Crohn's, GERD with a history of esophagitis, hypertension, diabetes, hyperlipidemia and asthma who is here for follow-up.  She is here alone today and was last seen in the office on 10/12/2022.   She has a history of distal Crohn's colitis with anorectal involvement, including prior fistula and anal stenosis. In April, she experienced a flare-up characterized by severe joint pain, fever, chills, and teeth chattering. She describes her body hurting from 'head to toe' and feeling cold, with episodes of sweating at night. Abdominal swelling was also noted during this period.  She continues to experience constipation despite being on Amitiza  (lubiprostone ) 8 mcg twice daily. She reports a cycle of incomplete bowel movements followed by 'blowouts,' where she has multiple bowel movements in a day after several days of constipation. She describes having up to six to seven bowel movements in a day, with significant cramping. She is concerned about the buildup of stool and the subsequent overflow blowouts.  Her current medication regimen includes infliximab  (Remicade ) 10 mg/kg every six weeks and azathioprine  100 mg daily. She also takes omeprazole  40 mg for GERD with esophagitis. Despite the use of Amitiza , she reports ongoing issues with constipation.  She has a history of hypertension, diabetes, hyperlipidemia, and asthma. She also reports a recent episode of gout, with swelling in her foot. During the review of symptoms, she reports experiencing fever, chills, and joint pain. No night sweats, but she describes waking up with a wet shirt and hair. She also reports straining during bowel movements and ongoing  constipation issues.  Review of Systems As per HPI, otherwise negative  Current Medications, Allergies, Past Medical History, Past Surgical History, Family History and Social History were reviewed in [employer] record.    Objective:   Physical Exam BP 118/60 (BP Location: Left Arm, Patient Position: Sitting, Cuff Size: Large)   Pulse 65   Ht 4\' 11"  (1.499 m)   Wt 208 lb 4 oz (94.5 kg)   SpO2 98%   BMI 42.06 kg/m / Gen: awake, alert, NAD HEENT: anicteric  Abd: soft, NT/ND, +BS throughout Ext: no c/c/e Neuro: nonfocal  LABS ANA: negative (08/24/2023) Rheumatoid factor: within normal limits (08/24/2023) Vitamin B12: within normal limits (08/24/2023)      Assessment & Plan:   68 year old female with a history of Crohn's disease of the colon and anorectum with prior fistula on infliximab  (started April 2022), anal stenosis secondary to Crohn's, GERD with a history of esophagitis, hypertension, diabetes, hyperlipidemia and asthma who is here for follow-up.  Crohn's disease of colon and perianal region Distal Crohn's colitis with anorectal involvement, prior fistula, and anal stenosis secondary to Crohn's. Recent flare-up with concerns about constipation and overflow diarrhea. Differential includes active Crohn's disease versus slow colonic transit. Discussed potential anal canal dilation if stenosis contributes to symptoms. - Order CT abdomen and pelvis to assess for inflammation/active IBD - Check infliximab  level and antibodies to assess drug efficacy and tolerance. -Thiopurine metabolite panel today to see where we are before possible azathioprine  dose adjustment to allow for allopurinol  -Quantiferon gold, CBC and CMP today - Continue infliximab  10 mg/kg every 6 weeks. - Continue azathioprine  100 mg daily, but see below if allopurinol   started. -Check infliximab  level between June 1 and June 4 - Consider colonoscopy based on CT  findings.  Constipation Chronic constipation with episodes of overflow diarrhea, likely related to Crohn's disease and anal stenosis. Current treatment with lubiprostone  is insufficient. - Increase lubiprostone  to 24 mcg twice daily to improve bowel regularity.  GERD with esophagitis GERD with esophagitis, currently well-managed with no reported symptoms. - Continue omeprazole  40 mg daily.  Gout Gout with recent flare-up. Current treatment with azathioprine  complicates allopurinol  use due to potential drug interactions. Discussed potential for allopurinol  to alter azathioprine  metabolism, allowing for dose adjustment. - Discuss with primary care provider about potential initiation of allopurinol  for gout management. - If allopurinol  is initiated we should reduce azathioprine  to 50 mg daily and check CBC every 7 days x 3 --I have also checked a thiopurine metabolite panel today to see where we are before dose adjustment  40 minutes total spent today including patient facing time, coordination of care, reviewing medical history/procedures/pertinent radiology studies, and documentation of the encounter.

## 2023-08-31 NOTE — Patient Instructions (Signed)
 Your provider has requested that you go to the basement level for lab work before leaving today. Press "B" on the elevator. The lab is located at the first door on the left as you exit the elevator.  We will have you come in for repeat labs in June. I will contact you when it gets closer to that time.  We have sent the following medications to your pharmacy for you to pick up at your convenience: azathioprine , omeprazole  and Amitiza .   You have been scheduled for a CT scan of the abdomen and pelvis at [facility], 1st floor Radiology. You are scheduled on 09/06/23 at 4:00pm, arriving at 2:00pm.   Please follow the written instructions below on the day of your exam:   1) Do not eat anything after 12:00pm (4 hours prior to your test)    You may take any medications as prescribed with a small amount of water, if necessary. If you take any of the following medications: METFORMIN, GLUCOPHAGE, GLUCOVANCE, AVANDAMET, RIOMET, FORTAMET, ACTOPLUS MET, JANUMET, GLUMETZA or METAGLIP, you MAY be asked to HOLD this medication 48 hours AFTER the exam.   The purpose of you drinking the oral contrast is to aid in the visualization of your intestinal tract. The contrast solution may cause some diarrhea. Depending on your individual set of symptoms, you may also receive an intravenous injection of x-ray contrast/dye. Plan on being at [facility] for 45 minutes or longer, depending on the type of exam you are having performed.   If you have any questions regarding your exam or if you need to reschedule, you may call [facility] at [phone] between the hours of 8:00 am and 5:00 pm, Monday-Friday.   Please follow up with Dr. Bridgett Camps in August. We do not have a schedule at this time. We will contact you when it becomes available.   _______________________________________________________  If your blood pressure at your visit was 140/90 or greater, please contact your primary care physician to  follow up on this.  _______________________________________________________  If you are age 28 or older, your body mass index should be between 23-30. Your Body mass index is 42.06 kg/m. If this is out of the aforementioned range listed, please consider follow up with your Primary Care Provider.  If you are age 3 or younger, your body mass index should be between 19-25. Your Body mass index is 42.06 kg/m. If this is out of the aformentioned range listed, please consider follow up with your Primary Care Provider.   ________________________________________________________  The [facility] providers would like to encourage you to use MYCHART to communicate with providers for non-urgent requests or questions.  Due to long hold times on the telephone, sending your provider a message by [facility] may be a faster and more efficient way to get a response.  Please allow 48 business hours for a response.  Please remember that this is for non-urgent requests.  _______________________________________________________

## 2023-08-31 NOTE — Progress Notes (Signed)
 Complex Care Management Note Care Guide Note  08/31/2023 Name: Kathy [name] MRN: [id number] DOB: 10/29/1955   Complex Care Management Outreach Attempts: A third unsuccessful outreach was attempted today to offer the patient with information about available complex care management services.  Follow Up Plan:  No further outreach attempts will be made at this time. We have been unable to contact the patient to offer or enroll patient in complex care management services.  Encounter Outcome:  No Answer  [facility]  [facility], [facility] Health Care Management Assistant Direct Dial: [phone]  Fax: [phone]

## 2023-08-31 NOTE — Telephone Encounter (Signed)
 See mychart.

## 2023-09-01 ENCOUNTER — Encounter: Payer: Self-pay | Admitting: Internal Medicine

## 2023-09-01 ENCOUNTER — Telehealth: Payer: Self-pay | Admitting: Family

## 2023-09-01 DIAGNOSIS — M109 Gout, unspecified: Secondary | ICD-10-CM

## 2023-09-01 DIAGNOSIS — Z5181 Encounter for therapeutic drug level monitoring: Secondary | ICD-10-CM

## 2023-09-01 NOTE — Telephone Encounter (Signed)
 Thanks I am in agreement with the dose de-escalation of azathioprine  to 50 mg daily if allopurinol  was started along with CBC weekly x 3 to ensure no decrease in blood cell lines Kathy [name]. Kathy [name], M.D.  09/01/2023

## 2023-09-01 NOTE — Telephone Encounter (Signed)
 Kathy [name], Please advise pt that Dr. Bridgett Camps reached out to me about the possibility of adding allopurinol  to lower her uric acid and prevent gout flares.   I am happy to send that but if we do she needs to do the following:  Decrease azathioprine  to 50mg  once daily and set up lab draws once weekly x 3 for cbc.    I have pended the orders if she decides to proceed. Let me know what she decides.

## 2023-09-06 ENCOUNTER — Ambulatory Visit (HOSPITAL_COMMUNITY)
Admission: RE | Admit: 2023-09-06 | Discharge: 2023-09-06 | Disposition: A | Discharge status: Home / Self Care | Source: Ambulatory Visit | Attending: Internal Medicine | Admitting: Internal Medicine

## 2023-09-06 DIAGNOSIS — K50119 Crohn's disease of large intestine with unspecified complications: Secondary | ICD-10-CM | POA: Insufficient documentation

## 2023-09-06 DIAGNOSIS — K449 Diaphragmatic hernia without obstruction or gangrene: Secondary | ICD-10-CM | POA: Diagnosis not present

## 2023-09-06 DIAGNOSIS — N281 Cyst of kidney, acquired: Secondary | ICD-10-CM | POA: Diagnosis not present

## 2023-09-06 MED ORDER — IOHEXOL 9 MG/ML PO SOLN
ORAL | Status: AC
Start: 2023-09-06 — End: ?
  Filled 2023-09-06: qty 1000

## 2023-09-06 MED ORDER — IOHEXOL 9 MG/ML PO SOLN
1000.0000 mL | ORAL | Status: AC
  Administered 2023-09-06: 1000 mL via ORAL

## 2023-09-06 MED ORDER — IOHEXOL 300 MG/ML  SOLN
80.0000 mL | Freq: Once | INTRAMUSCULAR | Status: AC | PRN
  Administered 2023-09-06: 80 mL via INTRAVENOUS

## 2023-09-07 ENCOUNTER — Ambulatory Visit: Payer: Self-pay | Admitting: Internal Medicine

## 2023-09-07 DIAGNOSIS — K50119 Crohn's disease of large intestine with unspecified complications: Secondary | ICD-10-CM

## 2023-09-07 LAB — QUANTIFERON-TB GOLD PLUS
Mitogen-NIL: 2.42 [IU]/mL
NIL: 0.01 [IU]/mL
QuantiFERON-TB Gold Plus: NEGATIVE
TB1-NIL: 0 [IU]/mL
TB2-NIL: 0 [IU]/mL

## 2023-09-07 LAB — THIOPURINE METABOLITES
6 MMP(6-Methylmercaptopurine): 1058 pmol/8x10(8)RBC (ref <5700)
6 TG(6-Thioguanine): 183 pmol/8x10(8)RBC — ABNORMAL LOW (ref 235–400)

## 2023-09-08 MED ORDER — ALLOPURINOL 100 MG PO TABS: 100.0000 mg | ORAL_TABLET | Freq: Every day | ORAL | 6 refills | Status: DC

## 2023-09-08 MED ORDER — AZATHIOPRINE 50 MG PO TABS: 50.0000 mg | ORAL_TABLET | Freq: Every day | ORAL | Status: DC

## 2023-09-08 NOTE — Telephone Encounter (Signed)
 Did you see this message from last week? tks

## 2023-09-08 NOTE — Telephone Encounter (Signed)
 Spoke w/ Pt- informed of PCP recommendations. Pt verbalized understanding, she asked that I go ahead and send in the allopurinol - she will call back to schedule lab appt once weekly x3 once she starts the medication.

## 2023-09-09 ENCOUNTER — Ambulatory Visit: Payer: Self-pay | Admitting: Family

## 2023-09-26 ENCOUNTER — Other Ambulatory Visit: Payer: Self-pay | Admitting: Internal Medicine

## 2023-09-26 ENCOUNTER — Other Ambulatory Visit: Payer: Self-pay | Admitting: Family

## 2023-09-26 DIAGNOSIS — I119 Hypertensive heart disease without heart failure: Secondary | ICD-10-CM

## 2023-09-27 ENCOUNTER — Other Ambulatory Visit

## 2023-09-27 DIAGNOSIS — K50119 Crohn's disease of large intestine with unspecified complications: Secondary | ICD-10-CM

## 2023-09-28 ENCOUNTER — Telehealth: Payer: Self-pay | Admitting: Internal Medicine

## 2023-09-28 MED ORDER — LUBIPROSTONE 24 MCG PO CAPS: 24.0000 ug | ORAL_CAPSULE | Freq: Two times a day (BID) | ORAL | 11 refills | Status: AC

## 2023-09-28 NOTE — Telephone Encounter (Signed)
 PT is calling to have a refill for lubiprostone  sent to the [facility] pharmacy. She was told it was denied and she would like to discuss why. Please advise.

## 2023-09-28 NOTE — Telephone Encounter (Signed)
 Informed patient we did not deny Amitiza . In fact we sent a refill on 08/31/23. Informed patient I will send in another refill to the pharmacy. Patient verbalized understanding.

## 2023-09-29 ENCOUNTER — Ambulatory Visit

## 2023-09-29 VITALS — BP 142/70 | HR 66 | Temp 97.9°F | Resp 20 | Ht 59.0 in | Wt 214.6 lb

## 2023-09-29 DIAGNOSIS — K50111 Crohn's disease of large intestine with rectal bleeding: Secondary | ICD-10-CM | POA: Diagnosis not present

## 2023-09-29 MED ORDER — ACETAMINOPHEN 325 MG PO TABS
650.0000 mg | ORAL_TABLET | Freq: Once | ORAL | Status: AC
  Administered 2023-09-29: 650 mg via ORAL
  Filled 2023-09-29: qty 2

## 2023-09-29 MED ORDER — DIPHENHYDRAMINE HCL 25 MG PO CAPS
25.0000 mg | ORAL_CAPSULE | Freq: Once | ORAL | Status: AC
  Administered 2023-09-29: 25 mg via ORAL
  Filled 2023-09-29: qty 1

## 2023-09-29 MED ORDER — SODIUM CHLORIDE 0.9 % IV SOLN
10.0000 mg/kg | Freq: Once | INTRAVENOUS | Status: AC
  Administered 2023-09-29: 1000 mg via INTRAVENOUS
  Filled 2023-09-29: qty 100

## 2023-09-29 NOTE — Progress Notes (Signed)
 Diagnosis: Crohn's Disease  Provider:  Praveen Mannam MD  Procedure: IV Infusion  IV Type: Peripheral, IV Location: R Antecubital  Remicade  (Infliximab ), Dose: 1000 mg  Infusion Start Time: 1119  Infusion Stop Time: 1330  Post Infusion IV Care: Peripheral IV Discontinued  Discharge: Condition: Good, Destination: Home . AVS Declined  Performed by:  Star East, LPN

## 2023-09-30 DIAGNOSIS — N2581 Secondary hyperparathyroidism of renal origin: Secondary | ICD-10-CM | POA: Diagnosis not present

## 2023-09-30 DIAGNOSIS — E1122 Type 2 diabetes mellitus with diabetic chronic kidney disease: Secondary | ICD-10-CM | POA: Diagnosis not present

## 2023-09-30 DIAGNOSIS — M1000 Idiopathic gout, unspecified site: Secondary | ICD-10-CM | POA: Diagnosis not present

## 2023-09-30 DIAGNOSIS — I129 Hypertensive chronic kidney disease with stage 1 through stage 4 chronic kidney disease, or unspecified chronic kidney disease: Secondary | ICD-10-CM | POA: Diagnosis not present

## 2023-09-30 DIAGNOSIS — N1832 Chronic kidney disease, stage 3b: Secondary | ICD-10-CM | POA: Diagnosis not present

## 2023-09-30 DIAGNOSIS — D631 Anemia in chronic kidney disease: Secondary | ICD-10-CM | POA: Diagnosis not present

## 2023-10-05 LAB — SERIAL MONITORING

## 2023-10-05 LAB — INFLIXIMAB+AB (SERIAL MONITOR): Anti-Infliximab Antibody: <22 ng/mL

## 2023-10-07 ENCOUNTER — Ambulatory Visit: Payer: Self-pay | Admitting: Internal Medicine

## 2023-10-18 ENCOUNTER — Other Ambulatory Visit: Payer: Self-pay | Admitting: Family

## 2023-10-18 DIAGNOSIS — E119 Type 2 diabetes mellitus without complications: Secondary | ICD-10-CM

## 2023-10-18 DIAGNOSIS — Z8679 Personal history of other diseases of the circulatory system: Secondary | ICD-10-CM

## 2023-10-26 ENCOUNTER — Other Ambulatory Visit: Payer: Self-pay | Admitting: Family

## 2023-11-03 ENCOUNTER — Other Ambulatory Visit: Payer: Self-pay | Admitting: Internal Medicine

## 2023-11-10 ENCOUNTER — Ambulatory Visit

## 2023-11-10 VITALS — BP 111/67 | HR 56 | Temp 97.5°F | Resp 18 | Ht 59.0 in | Wt 211.8 lb

## 2023-11-10 DIAGNOSIS — K50111 Crohn's disease of large intestine with rectal bleeding: Secondary | ICD-10-CM | POA: Diagnosis not present

## 2023-11-10 MED ORDER — DIPHENHYDRAMINE HCL 25 MG PO CAPS
25.0000 mg | ORAL_CAPSULE | Freq: Once | ORAL | Status: AC
  Administered 2023-11-10: 25 mg via ORAL
  Filled 2023-11-10: qty 1

## 2023-11-10 MED ORDER — ACETAMINOPHEN 325 MG PO TABS
650.0000 mg | ORAL_TABLET | Freq: Once | ORAL | Status: AC
  Administered 2023-11-10: 650 mg via ORAL
  Filled 2023-11-10: qty 2

## 2023-11-10 MED ORDER — SODIUM CHLORIDE 0.9 % IV SOLN
10.0000 mg/kg | Freq: Once | INTRAVENOUS | Status: AC
  Administered 2023-11-10: 1000 mg via INTRAVENOUS
  Filled 2023-11-10: qty 100

## 2023-11-10 NOTE — Progress Notes (Signed)
 Diagnosis: Crohn's Disease  Provider:  Praveen Mannam MD  Procedure: IV Infusion  IV Type: Peripheral, IV Location: R Antecubital  Remicade  (Infliximab ), Dose: 1000 mg  Infusion Start Time: 1133  Infusion Stop Time: 1344  Post Infusion IV Care: Peripheral IV Discontinued  Discharge: Condition: Good, Destination: Home . AVS Declined  Performed by:  Leita FORBES Miles, LPN

## 2023-11-15 ENCOUNTER — Ambulatory Visit (INDEPENDENT_AMBULATORY_CARE_PROVIDER_SITE_OTHER)

## 2023-11-15 VITALS — Ht 59.0 in | Wt 211.0 lb

## 2023-11-15 DIAGNOSIS — Z0000 Encounter for general adult medical examination without abnormal findings: Secondary | ICD-10-CM | POA: Diagnosis not present

## 2023-11-15 NOTE — Progress Notes (Signed)
 Subjective:   Kathy [name] is a 68 y.o. who presents for a Medicare Wellness preventive visit.  As a reminder, Annual Wellness Visits don't include a physical exam, and some assessments may be limited, especially if this visit is performed virtually. We may recommend an in-person follow-up visit with your provider if needed.  Visit Complete: Virtual I connected with  Seth Smith-Milliken on 11/15/23 by a audio enabled telemedicine application and verified that I am speaking with the correct person using two identifiers.  Patient Location: Home  Provider Location: Home Office  I discussed the limitations of evaluation and management by telemedicine. The patient expressed understanding and agreed to proceed.  Vital Signs: Because this visit was a virtual/telehealth visit, some criteria may be missing or patient reported. Any vitals not documented were not able to be obtained and vitals that have been documented are patient reported.  VideoDeclined- This patient declined Librarian, academic. Therefore the visit was completed with audio only.  Persons Participating in Visit: Patient.  AWV Questionnaire: No: Patient Medicare AWV questionnaire was not completed prior to this visit.  Cardiac Risk Factors include: advanced age (>62men, >69 women);diabetes mellitus;dyslipidemia;hypertension;sedentary lifestyle     Objective:    Today's Vitals   11/15/23 1458  Weight: 211 lb (95.7 kg)  Height: 4' 11 (1.499 m)   Body mass index is 42.62 kg/m.     11/15/2023    3:07 PM 04/07/2023   10:54 AM 02/24/2023   10:37 AM 11/24/2022    2:01 PM 11/17/2022   11:12 AM 08/13/2022    3:42 PM 03/17/2020    9:11 AM  Advanced Directives  Does Patient Have a Medical Advance Directive? No No No No No No No  Would patient like information on creating a medical advance directive? Yes (MAU/Ambulatory/Procedural Areas - Information given) No - Patient declined  No -  Patient declined No - Patient declined No - Patient declined No - Patient declined    Current Medications (verified) Outpatient Encounter Medications as of 11/15/2023  Medication Sig   albuterol  (VENTOLIN  HFA) 108 (90 Base) MCG/ACT inhaler Inhale 1 puff into the lungs every 6 (six) hours as needed for wheezing or shortness of breath.   allopurinol  (ZYLOPRIM ) 100 MG tablet Take 1 tablet (100 mg total) by mouth daily.   amLODipine  (NORVASC ) 10 MG tablet Take 1 tablet (10 mg total) by mouth daily.   azaTHIOprine  (IMURAN ) 50 MG tablet Take 1 tablet (50 mg total) by mouth daily.   clotrimazole -betamethasone  (LOTRISONE ) cream Apply 1 application topically 2 (two) times daily.   colchicine  0.6 MG tablet Take 1.2 mg once, then 0.6 mg an hour later.  May take 1 tablet daily for 2-3 more days if needed for gout pain   Continuous Glucose Receiver (FREESTYLE LIBRE 3 READER) DEVI Use as directed to monitor blood sugar   Continuous Glucose Sensor (FREESTYLE LIBRE 3 SENSOR) MISC Use as directed to monitor blood sugar. Change every 14 days   cyanocobalamin  (VITAMIN B12) 1000 MCG tablet Take 1 tablet (1,000 mcg total) by mouth daily.   dicyclomine  (BENTYL ) 10 MG capsule TAKE 1 CAPSULE BY MOUTH THREE TIMES DAILY AS NEEDED FOR  CRAMPING   glipiZIDE  (GLUCOTROL  XL) 10 MG 24 hr tablet Take 1 tablet (10 mg total) by mouth daily with breakfast.   hydrochlorothiazide  (MICROZIDE ) 12.5 MG capsule Take 1 capsule (12.5 mg total) by mouth daily.   hydrocortisone  (ANUSOL -HC) 25 MG suppository Place 1 suppository (25 mg total) rectally at bedtime.  Iron , Ferrous Sulfate , 325 (65 Fe) MG TABS Take 325 mg by mouth every other day.   lubiprostone  (AMITIZA ) 24 MCG capsule Take 1 capsule (24 mcg total) by mouth 2 (two) times daily with a meal.   metoprolol  succinate (TOPROL -XL) 50 MG 24 hr tablet Take 1 tablet (50 mg total) by mouth daily. Take with or immediately following a meal   Multiple Vitamins-Minerals (MULTIVITAMIN WITH  MINERALS) tablet Take 1 tablet by mouth daily.   omeprazole  (PRILOSEC) 40 MG capsule TAKE 1 CAPSULE BY MOUTH ONCE DAILY   polyethylene glycol (MIRALAX  / GLYCOLAX ) 17 g packet Take 17 g by mouth as needed. 1 capful prn   REMICADE  100 MG injection INFUSE 1000MG  IV EVERY 6 WEEKS   simvastatin  (ZOCOR ) 10 MG tablet Take 1 tablet by mouth once daily   sitaGLIPtin  (JANUVIA ) 50 MG tablet Take 1 tablet (50 mg total) by mouth daily.   valsartan  (DIOVAN ) 40 MG tablet Take 1 tablet (40 mg total) by mouth daily.   colchicine  0.6 MG tablet Take 2 tabs by mouth now and 1 tab in 1 hour.   No facility-administered encounter medications on file as of 11/15/2023.    Allergies (verified) Metformin, Lipitor [atorvastatin calcium], Lisinopril, Pravachol [pravastatin sodium], and Pravastatin sodium   History: Past Medical History:  Diagnosis Date   Anal stenosis    Asthma    09-23-2017  per pt has not done inhaler since May 2018, no insurance (QVAR bid and Ventolin  prn)   B12 deficiency 01/05/2021   Crohn disease (HCC)    Diastolic dysfunction    per echo 06-23-2009  grade 2 diastolic dysfunction , ef 60-65%   Esophagitis    GERD (gastroesophageal reflux disease)    Hemorrhoids    Hiatal hernia    Hyperlipidemia    stopped meds due to side effects   Hypertension    09-23-2017  per pt has takes bp meds since May 2018 no insurance (losartan 100mg  qd, HCTZ 12.5mg  qd, and toprol  50 qd)   Microalbuminuria 06/01/2006   Qualifier: Diagnosis of  By: Tobie MD, Sejal     Type 2 diabetes mellitus (HCC)    09-23-2017 per pt has not taken diabetic meds since May 2018, no insurance (kombiglyze 5-1000mg  qd)   Past Surgical History:  Procedure Laterality Date   BREAST BIOPSY Bilateral    CARDIOVASCULAR STRESS TEST  07/29/2009   normal nuclear study w/ no ischemia/  normal LV function and wall motion,  ef 70%   CESAREAN SECTION  1980s   RECTAL BIOPSY N/A 10/03/2017   Procedure: POSSIBLE BIOPSY RECTAL;  Surgeon:  Teresa Lonni HERO, MD;  Location: [facility];  Service: General;  Laterality: N/A;   Family History  Problem Relation Age of Onset   Hypertension Mother 3   Stroke Mother 52   Diabetes Maternal Aunt        s/p bilater amputation due to DM   Asthma Father    Colon cancer Neg Hx    Esophageal cancer Neg Hx    Stomach cancer Neg Hx    Rectal cancer Neg Hx    Social History   Socioeconomic History   Marital status: Married    Spouse name: Not on file   Number of children: 2   Years of education: Not on file   Highest education level: Not on file  Occupational History   Occupation: unemployed  Tobacco Use   Smoking status: Never   Smokeless tobacco: Never  Vaping Use  Vaping status: Never Used  Substance and Sexual Activity   Alcohol use: No   Drug use: No   Sexual activity: Not Currently  Other Topics Concern   Not on file  Social History Narrative   Not working currently. Retired in 2019. Previously worked in [employer] job   Married    2 children (son and daughter). They are both local   She has 1 grand-daughter and 1 grand-son   No pets (allergic)   Completed 4 years of college.   Social Drivers of Corporate investment banker Strain: Low Risk  (11/15/2023)   Overall Financial Resource Strain (CARDIA)    Difficulty of Paying Living Expenses: Not hard at all  Food Insecurity: No Food Insecurity (11/15/2023)   Hunger Vital Sign    Worried About Running Out of Food in the Last Year: Never true    Ran Out of Food in the Last Year: Never true  Transportation Needs: No Transportation Needs (11/15/2023)   PRAPARE - Administrator, Civil Service (Medical): No    Lack of Transportation (Non-Medical): No  Physical Activity: Inactive (11/15/2023)   Exercise Vital Sign    Days of Exercise per Week: 0 days    Minutes of Exercise per Session: 0 min  Stress: No Stress Concern Present (11/15/2023)   [employer] of Occupational Health - Occupational Stress  Questionnaire    Feeling of Stress: Not at all  Social Connections: Moderately Integrated (11/15/2023)   Social Connection and Isolation Panel    Frequency of Communication with Friends and Family: More than three times a week    Frequency of Social Gatherings with Friends and Family: Twice a week    Attends Religious Services: More than 4 times per year    Active Member of [employer] or Organizations: No    Attends Engineer, structural: Never    Marital Status: Married    Tobacco Counseling Counseling given: Not Answered    Clinical Intake:  Pre-visit preparation completed: Yes  Pain : No/denies pain  Diabetes: Yes CBG done?: No Did pt. bring in CBG monitor from home?: No  Lab Results  Component Value Date   HGBA1C 7.1 (H) 08/01/2023   HGBA1C 6.9 (H) 05/04/2023   HGBA1C 6.8 (H) 01/24/2023     How often do you need to have someone help you when you read instructions, pamphlets, or other written materials from your doctor or pharmacy?: 1 - Never  Interpreter Needed?: No  Information entered by :: Charmaine Bloodgood LPN   Activities of Daily Living     11/15/2023    3:07 PM  In your present state of health, do you have any difficulty performing the following activities:  Hearing? 0  Vision? 0  Difficulty concentrating or making decisions? 0  Walking or climbing stairs? 0  Dressing or bathing? 0  Doing errands, shopping? 0  Preparing Food and eating ? N  Using the Toilet? N  In the past six months, have you accidently leaked urine? N  Do you have problems with loss of bowel control? N  Managing your Medications? N  Managing your Finances? N  Housekeeping or managing your Housekeeping? N    Patient Care Team: Daryl Setter, NP as PCP - General (Internal Medicine) [facility], Gordy HERO, MD as Consulting Physician (Gastroenterology) [facility], [facility]  I have updated your Care Teams any recent Medical  Services you may have received from other  providers in the past year.     Assessment:   This is a routine wellness examination for Arrion.  Hearing/Vision screen Hearing Screening - Comments:: Denies hearing difficulties   Vision Screening - Comments:: Wears rx glasses - up to date with routine eye exams with [facility])    Goals Addressed             This Visit's Progress    Maintain health and independence   On track      Depression Screen     11/15/2023    3:05 PM 04/07/2023   10:54 AM 02/24/2023   10:37 AM 01/24/2023   10:49 AM 08/13/2022    3:48 PM 07/06/2022   10:31 AM 03/10/2022    9:23 AM  PHQ 2/9 Scores  PHQ - 2 Score 0 0 0 0 1 0 0  PHQ- 9 Score    0  0     Fall Risk     11/15/2023    3:07 PM 04/07/2023   10:54 AM 02/24/2023   10:36 AM 01/24/2023   10:49 AM 08/13/2022    3:43 PM  Fall Risk   Falls in the past year? 0 0 0 0 0  Number falls in past yr: 0 0  0 0  Injury with Fall? 0   0 0  Risk for fall due to : No Fall Risks No Fall Risks No Fall Risks No Fall Risks No Fall Risks  Follow up Falls prevention discussed;Education provided;Falls evaluation completed Falls evaluation completed Falls evaluation completed Falls evaluation completed Falls evaluation completed    MEDICARE RISK AT HOME:  Medicare Risk at Home Any stairs in or around the home?: No If so, are there any without handrails?: No Home free of loose throw rugs in walkways, pet beds, electrical cords, etc?: Yes Adequate lighting in your home to reduce risk of falls?: Yes Life alert?: No Use of a cane, walker or w/c?: No Grab bars in the bathroom?: Yes Shower chair or bench in shower?: No Elevated toilet seat or a handicapped toilet?: Yes  TIMED UP AND GO:  Was the test performed?  No  Cognitive Function: Declined/Normal: No cognitive concerns noted by patient or family. Patient alert, oriented, able to answer questions appropriately and recall recent events. No signs of memory  loss or confusion.        08/13/2022    3:53 PM  6CIT Screen  What Year? 0 points  What month? 0 points  What time? 0 points  Count back from 20 0 points  Months in reverse 0 points  Repeat phrase 0 points  Total Score 0 points    Immunizations Immunization History  Administered Date(s) Administered   Fluad  Quad(high Dose 65+) 01/16/2019, 02/04/2020, 01/05/2021, 02/24/2022   Fluad  Trivalent(High Dose 65+) 01/26/2023   Influenza,inj,Quad PF,6+ Mos 02/09/2018   MMR 05/09/2003   PFIZER(Purple Top)SARS-COV-2 Vaccination 06/22/2019, 07/17/2019, 02/22/2020   Pfizer(Comirnaty )Fall Seasonal Vaccine 12 years and older 03/01/2022, 03/01/2022, 03/01/2023   Td 05/09/2003, 04/08/2005, 10/11/2018   Tdap 06/09/2016    Screening Tests Health Maintenance  Topic Date Due   Colonoscopy  05/22/2021   Pneumococcal Vaccine: 50+ Years (1 of 2 - PCV) 01/24/2024 (Originally 07/26/1974)   COVID-19 Vaccine (6 - Pfizer risk 2024-25 season) 10/17/2024 (Originally 08/29/2023)   INFLUENZA VACCINE  11/25/2023   HEMOGLOBIN A1C  01/31/2024   OPHTHALMOLOGY EXAM  04/22/2024   FOOT EXAM  05/03/2024   Diabetic kidney evaluation - Urine ACR  08/23/2024  Diabetic kidney evaluation - eGFR measurement  08/30/2024   MAMMOGRAM  10/18/2024   Medicare Annual Wellness (AWV)  11/14/2024   DTaP/Tdap/Td (5 - Td or Tdap) 10/10/2028   DEXA SCAN  Completed   Hepatitis C Screening  Completed   Hepatitis B Vaccines  Aged Out   HPV VACCINES  Aged Out   Meningococcal B Vaccine  Aged Out   Zoster Vaccines- Shingrix  Discontinued    Health Maintenance  Health Maintenance Due  Topic Date Due   Colonoscopy  05/22/2021   Health Maintenance Items Addressed: Sees GI regularly; stated that Dr. Albertus wanted to hold on colonoscopy and elected to do MRI instead   Additional Screening:  Vision Screening: Recommended annual ophthalmology exams for early detection of glaucoma and other disorders of the eye. Would you like a  referral to an eye doctor? No    Dental Screening: Recommended annual dental exams for proper oral hygiene  Community Resource Referral / Chronic Care Management: CRR required this visit?  No   CCM required this visit?  No   Plan:    I have personally reviewed and noted the following in the patient's chart:   Medical and social history Use of alcohol, tobacco or illicit drugs  Current medications and supplements including opioid prescriptions. Patient is not currently taking opioid prescriptions. Functional ability and status Nutritional status Physical activity Advanced directives List of other physicians Hospitalizations, surgeries, and ER visits in previous 12 months Vitals Screenings to include cognitive, depression, and falls Referrals and appointments  In addition, I have reviewed and discussed with patient certain preventive protocols, quality metrics, and best practice recommendations. A written personalized care plan for preventive services as well as general preventive health recommendations were provided to patient.   Lavelle Pfeiffer [place], [place]   2/77/7974   After Visit Summary: (MyChart) Due to this being a telephonic visit, the after visit summary with patients personalized plan was offered to patient via MyChart   Notes: Nothing significant to report at this time.

## 2023-11-15 NOTE — Patient Instructions (Signed)
 [name] , Thank you for taking time out of your busy schedule to complete your Annual Wellness Visit with me. I enjoyed our conversation and look forward to speaking with you again next year. I, as well as your care team,  appreciate your ongoing commitment to your health goals. Please review the following plan we discussed and let me know if I can assist you in the future. Your Game plan/ To Do List    Follow up Visits: Next Medicare AWV with our clinical staff: In 1 year    Have you seen your provider in the last 6 months (3 months if uncontrolled diabetes)? Yes Next Office Visit with your provider: 11/23/23 @ 1:00   Clinician Recommendations:  Aim for 30 minutes of exercise or brisk walking, 6-8 glasses of water, and 5 servings of fruits and vegetables each day.       This is a list of the screening recommended for you and due dates:  Health Maintenance  Topic Date Due   Colon Cancer Screening  05/22/2021   Pneumococcal Vaccine for age over 59 (1 of 2 - PCV) 01/24/2024*   COVID-19 Vaccine (6 - Pfizer risk 2024-25 season) 10/17/2024*   Flu Shot  11/25/2023   Hemoglobin A1C  01/31/2024   Eye exam for diabetics  04/22/2024   Complete foot exam   05/03/2024   Yearly kidney health urinalysis for diabetes  08/23/2024   Yearly kidney function blood test for diabetes  08/30/2024   Mammogram  10/18/2024   Medicare Annual Wellness Visit  11/14/2024   DTaP/Tdap/Td vaccine (5 - Td or Tdap) 10/10/2028   DEXA scan (bone density measurement)  Completed   Hepatitis C Screening  Completed   Hepatitis B Vaccine  Aged Out   HPV Vaccine  Aged Out   Meningitis B Vaccine  Aged Out   Zoster (Shingles) Vaccine  Discontinued  *Topic was postponed. The date shown is not the original due date.    Advanced directives: (ACP Link)Information on Advanced Care Planning can be found at [place]  Secretary of [facility] Advance Health Care Directives Advance Health Care Directives. [email or web address]  Advance  Care Planning is important because it:  [x]  Makes sure you receive the medical care that is consistent with your values, goals, and preferences  [x]  It provides guidance to your family and loved ones and reduces their decisional burden about whether or not they are making the right decisions based on your wishes.  Follow the link provided in your after visit summary or read over the paperwork we have mailed to you to help you started getting your Advance Directives in place. If you need assistance in completing these, please reach out to us  so that we can help you!  See attachments for Preventive Care and Fall Prevention Tips.

## 2023-11-17 ENCOUNTER — Other Ambulatory Visit: Payer: Self-pay | Admitting: Family

## 2023-11-17 DIAGNOSIS — I10 Essential (primary) hypertension: Secondary | ICD-10-CM

## 2023-11-23 ENCOUNTER — Encounter: Payer: Self-pay | Admitting: Family

## 2023-11-23 ENCOUNTER — Ambulatory Visit (INDEPENDENT_AMBULATORY_CARE_PROVIDER_SITE_OTHER): Admitting: Family

## 2023-11-23 VITALS — BP 124/53 | HR 64 | Temp 99.0°F | Resp 16 | Ht 59.0 in | Wt 211.8 lb

## 2023-11-23 DIAGNOSIS — Z8679 Personal history of other diseases of the circulatory system: Secondary | ICD-10-CM

## 2023-11-23 DIAGNOSIS — I10 Essential (primary) hypertension: Secondary | ICD-10-CM

## 2023-11-23 DIAGNOSIS — Z794 Long term (current) use of insulin: Secondary | ICD-10-CM

## 2023-11-23 DIAGNOSIS — E119 Type 2 diabetes mellitus without complications: Secondary | ICD-10-CM

## 2023-11-23 DIAGNOSIS — K2100 Gastro-esophageal reflux disease with esophagitis, without bleeding: Secondary | ICD-10-CM | POA: Diagnosis not present

## 2023-11-23 DIAGNOSIS — R779 Abnormality of plasma protein, unspecified: Secondary | ICD-10-CM | POA: Diagnosis not present

## 2023-11-23 DIAGNOSIS — M25572 Pain in left ankle and joints of left foot: Secondary | ICD-10-CM | POA: Diagnosis not present

## 2023-11-23 DIAGNOSIS — N1832 Chronic kidney disease, stage 3b: Secondary | ICD-10-CM

## 2023-11-23 DIAGNOSIS — M109 Gout, unspecified: Secondary | ICD-10-CM

## 2023-11-23 DIAGNOSIS — E1165 Type 2 diabetes mellitus with hyperglycemia: Secondary | ICD-10-CM

## 2023-11-23 DIAGNOSIS — E7800 Pure hypercholesterolemia, unspecified: Secondary | ICD-10-CM

## 2023-11-23 DIAGNOSIS — E538 Deficiency of other specified B group vitamins: Secondary | ICD-10-CM

## 2023-11-23 DIAGNOSIS — Z8669 Personal history of other diseases of the nervous system and sense organs: Secondary | ICD-10-CM

## 2023-11-23 DIAGNOSIS — K581 Irritable bowel syndrome with constipation: Secondary | ICD-10-CM

## 2023-11-23 MED ORDER — GLIPIZIDE ER 10 MG PO TB24: 10.0000 mg | ORAL_TABLET | Freq: Every day | ORAL | 1 refills | Status: AC

## 2023-11-23 MED ORDER — SIMVASTATIN 10 MG PO TABS: 10.0000 mg | ORAL_TABLET | Freq: Every day | ORAL | 1 refills | Status: AC

## 2023-11-23 MED ORDER — HYDROCHLOROTHIAZIDE 12.5 MG PO CAPS
12.5000 mg | ORAL_CAPSULE | Freq: Every day | ORAL | 1 refills | Status: DC
Start: 2023-11-23 — End: 2024-03-13

## 2023-11-23 MED ORDER — METOPROLOL SUCCINATE ER 50 MG PO TB24: 50.0000 mg | ORAL_TABLET | Freq: Every day | ORAL | 1 refills | Status: AC

## 2023-11-23 MED ORDER — COLCHICINE 0.6 MG PO TABS: ORAL_TABLET | ORAL | 0 refills | Status: AC

## 2023-11-23 MED ORDER — SITAGLIPTIN PHOSPHATE 50 MG PO TABS: 50.0000 mg | ORAL_TABLET | Freq: Every day | ORAL | 1 refills | Status: AC

## 2023-11-23 MED ORDER — VALSARTAN 40 MG PO TABS: 40.0000 mg | ORAL_TABLET | Freq: Every day | ORAL | 1 refills | Status: AC

## 2023-11-23 MED ORDER — FREESTYLE LIBRE 3 SENSOR MISC
1.0000 | 5 refills | Status: AC
Start: 2023-11-23 — End: ?

## 2023-11-23 NOTE — Assessment & Plan Note (Signed)
 Continues to follow annually with [facility].

## 2023-11-23 NOTE — Assessment & Plan Note (Signed)
 BP Readings from Last 3 Encounters:  11/23/23 (!) 124/53  11/10/23 111/67  09/29/23 (!) 142/70    BP stable, continue current meds.

## 2023-11-23 NOTE — Assessment & Plan Note (Signed)
 Lab Results  Component Value Date   HGBA1C 7.1 (H) 08/01/2023   HGBA1C 6.9 (H) 05/04/2023   HGBA1C 6.8 (H) 01/24/2023   Lab Results  Component Value Date   MICROALBUR 13.8 (H) 08/24/2023   LDLCALC 60 01/24/2023   CREATININE 1.69 (H) 08/31/2023   Stable on glipizide  and januvia . Update A1C.

## 2023-11-23 NOTE — Assessment & Plan Note (Signed)
 Level normal on oral b12 supplement.  Continue same.

## 2023-11-23 NOTE — Progress Notes (Signed)
 Subjective:     Patient ID: Kathy [name], female    DOB: August 05, 1955, 68 y.o.   MRN: [id number]  Chief Complaint  Patient presents with   Hypertension    Here for follow up   Diabetes    Here for follow up    Hypertension  Diabetes    Discussed the use of AI scribe software for clinical note transcription with the patient, who gave verbal consent to proceed.  History of Present Illness   Kathy [name] is a 68 year old female who presents for a regular follow-up.  She experiences nausea and stomach pain approximately 15 days per month, often related to medication timing. She takes Remicade  every six weeks and other medications daily but cannot identify a specific cause. She manages type 2 diabetes with glipizide  and Januvia , maintaining blood sugar levels around 120 mg/dL, though she experiences nocturnal hypoglycemia if she does not eat. She uses a Freestyle meter and requires sensor refills.  She takes aloprim  for gout and has not had an attack since starting it a month ago. She uses colchicine  as needed but currently has none available. Heartburn is managed with daily Prilosec. Her blood pressure is managed with amlodipine , hydrochlorothiazide , metoprolol  XL, and Diovan . She takes simvastatin  for cholesterol, with levels last checked a year ago. Mild asthma symptoms resolve without intervention, though she has a spray available. She engages in minimal physical activity due to the heat, walking around her apartment complex when possible.    Health Maintenance Due  Topic Date Due   Colonoscopy  05/22/2021    Past Medical History:  Diagnosis Date   Anal stenosis    Asthma    09-23-2017  per pt has not done inhaler since May 2018, no insurance (QVAR bid and Ventolin  prn)   B12 deficiency 01/05/2021   Crohn disease (HCC)    Diastolic dysfunction    per echo 06-23-2009  grade 2 diastolic dysfunction , ef 60-65%   Esophagitis    GERD (gastroesophageal reflux  disease)    Hemorrhoids    Hiatal hernia    History of migraine 07/17/2008   Qualifier: Diagnosis of   By: Tanda MD, LauraLee         Hyperlipidemia    stopped meds due to side effects   Hypertension    09-23-2017  per pt has takes bp meds since May 2018 no insurance (losartan 100mg  qd, HCTZ 12.5mg  qd, and toprol  50 qd)   Microalbuminuria 06/01/2006   Qualifier: Diagnosis of  By: Tobie MD, Sejal     Type 2 diabetes mellitus (HCC)    09-23-2017 per pt has not taken diabetic meds since May 2018, no insurance (kombiglyze 5-1000mg  qd)    Past Surgical History:  Procedure Laterality Date   BREAST BIOPSY Bilateral    CARDIOVASCULAR STRESS TEST  07/29/2009   normal nuclear study w/ no ischemia/  normal LV function and wall motion,  ef 70%   CESAREAN SECTION  1980s   RECTAL BIOPSY N/A 10/03/2017   Procedure: POSSIBLE BIOPSY RECTAL;  Surgeon: Teresa Lonni HERO, MD;  Location: [facility];  Service: General;  Laterality: N/A;    Family History  Problem Relation Age of Onset   Hypertension Mother 29   Stroke Mother 32   Diabetes Maternal Aunt        s/p bilater amputation due to DM   Asthma Father    Colon cancer Neg Hx    Esophageal cancer Neg Hx    Stomach cancer  Neg Hx    Rectal cancer Neg Hx     Social History   Socioeconomic History   Marital status: Married    Spouse name: Not on file   Number of children: 2   Years of education: Not on file   Highest education level: Not on file  Occupational History   Occupation: unemployed  Tobacco Use   Smoking status: Never   Smokeless tobacco: Never  Vaping Use   Vaping status: Never Used  Substance and Sexual Activity   Alcohol use: No   Drug use: No   Sexual activity: Not Currently  Other Topics Concern   Not on file  Social History Narrative   Not working currently. Retired in 2019. Previously worked in [employer] job   Married    2 children (son and daughter). They are both local   She has 1 grand-daughter and 1  grand-son   No pets (allergic)   Completed 4 years of college.   Social Drivers of Corporate investment banker Strain: Low Risk  (11/15/2023)   Overall Financial Resource Strain (CARDIA)    Difficulty of Paying Living Expenses: Not hard at all  Food Insecurity: No Food Insecurity (11/15/2023)   Hunger Vital Sign    Worried About Running Out of Food in the Last Year: Never true    Ran Out of Food in the Last Year: Never true  Transportation Needs: No Transportation Needs (11/15/2023)   PRAPARE - Administrator, Civil Service (Medical): No    Lack of Transportation (Non-Medical): No  Physical Activity: Inactive (11/15/2023)   Exercise Vital Sign    Days of Exercise per Week: 0 days    Minutes of Exercise per Session: 0 min  Stress: No Stress Concern Present (11/15/2023)   [employer] of Occupational Health - Occupational Stress Questionnaire    Feeling of Stress: Not at all  Social Connections: Moderately Integrated (11/15/2023)   Social Connection and Isolation Panel    Frequency of Communication with Friends and Family: More than three times a week    Frequency of Social Gatherings with Friends and Family: Twice a week    Attends Religious Services: More than 4 times per year    Active Member of [employer] or Organizations: No    Attends Banker Meetings: Never    Marital Status: Married  Catering manager Violence: Not At Risk (11/15/2023)   Humiliation, Afraid, Rape, and Kick questionnaire    Fear of Current or Ex-Partner: No    Emotionally Abused: No    Physically Abused: No    Sexually Abused: No    Outpatient Medications Prior to Visit  Medication Sig Dispense Refill   albuterol  (VENTOLIN  HFA) 108 (90 Base) MCG/ACT inhaler Inhale 1 puff into the lungs every 6 (six) hours as needed for wheezing or shortness of breath. 18 g 5   allopurinol  (ZYLOPRIM ) 100 MG tablet Take 1 tablet (100 mg total) by mouth daily. 30 tablet 6   amLODipine  (NORVASC ) 10 MG  tablet Take 1 tablet (10 mg total) by mouth daily. 90 tablet 1   azaTHIOprine  (IMURAN ) 50 MG tablet Take 1 tablet (50 mg total) by mouth daily.     clotrimazole -betamethasone  (LOTRISONE ) cream Apply 1 application topically 2 (two) times daily. 30 g 1   colchicine  0.6 MG tablet Take 2 tabs by mouth now and 1 tab in 1 hour. 6 tablet 1   Continuous Glucose Receiver (FREESTYLE LIBRE 3 READER) DEVI Use as  directed to monitor blood sugar 1 each 0   cyanocobalamin  (VITAMIN B12) 1000 MCG tablet Take 1 tablet (1,000 mcg total) by mouth daily.     dicyclomine  (BENTYL ) 10 MG capsule TAKE 1 CAPSULE BY MOUTH THREE TIMES DAILY AS NEEDED FOR  CRAMPING 90 capsule 2   hydrocortisone  (ANUSOL -HC) 25 MG suppository Place 1 suppository (25 mg total) rectally at bedtime. 14 suppository 0   Iron , Ferrous Sulfate , 325 (65 Fe) MG TABS Take 325 mg by mouth every other day.     lubiprostone  (AMITIZA ) 24 MCG capsule Take 1 capsule (24 mcg total) by mouth 2 (two) times daily with a meal. 60 capsule 11   Multiple Vitamins-Minerals (MULTIVITAMIN WITH MINERALS) tablet Take 1 tablet by mouth daily.     omeprazole  (PRILOSEC) 40 MG capsule TAKE 1 CAPSULE BY MOUTH ONCE DAILY 90 capsule 3   polyethylene glycol (MIRALAX  / GLYCOLAX ) 17 g packet Take 17 g by mouth as needed. 1 capful prn 30 each 11   REMICADE  100 MG injection INFUSE 1000MG  IV EVERY 6 WEEKS 10 each 4   colchicine  0.6 MG tablet Take 1.2 mg once, then 0.6 mg an hour later.  May take 1 tablet daily for 2-3 more days if needed for gout pain 15 tablet 0   Continuous Glucose Sensor (FREESTYLE LIBRE 3 SENSOR) MISC Use as directed to monitor blood sugar. Change every 14 days 2 each 5   glipiZIDE  (GLUCOTROL  XL) 10 MG 24 hr tablet Take 1 tablet (10 mg total) by mouth daily with breakfast. 90 tablet 0   hydrochlorothiazide  (MICROZIDE ) 12.5 MG capsule Take 1 capsule (12.5 mg total) by mouth daily. 90 capsule 0   metoprolol  succinate (TOPROL -XL) 50 MG 24 hr tablet TAKE 1 TABLET BY  MOUTH ONCE DAILY WITH OR IMMEDIATELY FOLLOWING A MEAL 90 tablet 0   simvastatin  (ZOCOR ) 10 MG tablet Take 1 tablet by mouth once daily 90 tablet 0   sitaGLIPtin  (JANUVIA ) 50 MG tablet Take 1 tablet (50 mg total) by mouth daily. 90 tablet 0   valsartan  (DIOVAN ) 40 MG tablet Take 1 tablet (40 mg total) by mouth daily. 90 tablet 0   No facility-administered medications prior to visit.    Allergies  Allergen Reactions   Metformin Nausea Only   Lipitor [Atorvastatin Calcium]     Leg cramps    Lisinopril Itching   Pravachol [Pravastatin Sodium]    Pravastatin Sodium Other (See Comments)    leg cramps    ROS See HPI    Objective:    Physical Exam Constitutional:      General: She is not in acute distress.    Appearance: Normal appearance. She is well-developed.  HENT:     Head: Normocephalic and atraumatic.     Right Ear: External ear normal.     Left Ear: External ear normal.  Eyes:     General: No scleral icterus. Neck:     Thyroid : No thyromegaly.  Cardiovascular:     Rate and Rhythm: Normal rate and regular rhythm.     Heart sounds: Normal heart sounds. No murmur heard. Pulmonary:     Effort: Pulmonary effort is normal. No respiratory distress.     Breath sounds: Normal breath sounds. No wheezing.  Musculoskeletal:        General: Swelling (2+ bilateral LE edema) present.     Cervical back: Neck supple.  Skin:    General: Skin is warm and dry.  Neurological:     Mental Status: She is  alert and oriented to person, place, and time.  Psychiatric:        Mood and Affect: Mood normal.        Behavior: Behavior normal.        Thought Content: Thought content normal.        Judgment: Judgment normal.      BP (!) 124/53 (BP Location: Right Arm, Patient Position: Sitting, Cuff Size: Large)   Pulse 64   Temp 99 F (37.2 C) (Oral)   Resp 16   Ht 4' 11 (1.499 m)   Wt 211 lb 12.8 oz (96.1 kg)   SpO2 100%   BMI 42.78 kg/m  Wt Readings from Last 3 Encounters:   11/23/23 211 lb 12.8 oz (96.1 kg)  11/15/23 211 lb (95.7 kg)  11/10/23 211 lb 12.8 oz (96.1 kg)   Lab Results  Component Value Date   CHOL 125 01/24/2023   HDL 44.10 01/24/2023   LDLCALC 60 01/24/2023   TRIG 104.0 01/24/2023   CHOLHDL 3 01/24/2023       Assessment & Plan:   Problem List Items Addressed This Visit       High   Irritable bowel syndrome with constipation   Maintained on amitiza . Advised pt to discuss chronic nausea with her GI specialist.         Unprioritized   Hyperlipidemia (Chronic)   Relevant Medications   metoprolol  succinate (TOPROL -XL) 50 MG 24 hr tablet   hydrochlorothiazide  (MICROZIDE ) 12.5 MG capsule   valsartan  (DIOVAN ) 40 MG tablet   simvastatin  (ZOCOR ) 10 MG tablet   Other Relevant Orders   Lipid panel   Essential hypertension (Chronic)   BP Readings from Last 3 Encounters:  11/23/23 (!) 124/53  11/10/23 111/67  09/29/23 (!) 142/70    BP stable, continue current meds.       Relevant Medications   metoprolol  succinate (TOPROL -XL) 50 MG 24 hr tablet   hydrochlorothiazide  (MICROZIDE ) 12.5 MG capsule   valsartan  (DIOVAN ) 40 MG tablet   simvastatin  (ZOCOR ) 10 MG tablet   Uncontrolled type 2 diabetes mellitus with hyperglycemia (HCC) - Primary   Lab Results  Component Value Date   HGBA1C 7.1 (H) 08/01/2023   HGBA1C 6.9 (H) 05/04/2023   HGBA1C 6.8 (H) 01/24/2023   Lab Results  Component Value Date   MICROALBUR 13.8 (H) 08/24/2023   LDLCALC 60 01/24/2023   CREATININE 1.69 (H) 08/31/2023   Stable on glipizide  and januvia . Update A1C.      Relevant Medications   glipiZIDE  (GLUCOTROL  XL) 10 MG 24 hr tablet   valsartan  (DIOVAN ) 40 MG tablet   Continuous Glucose Sensor (FREESTYLE LIBRE 3 SENSOR) MISC   sitaGLIPtin  (JANUVIA ) 50 MG tablet   simvastatin  (ZOCOR ) 10 MG tablet   Other Relevant Orders   HgB A1c   Basic Metabolic Panel (BMET)   Urine Microalbumin w/creat. ratio   Obesity, morbid (HCC)   Continue to work on [employer] and exercise.      Relevant Medications   glipiZIDE  (GLUCOTROL  XL) 10 MG 24 hr tablet   sitaGLIPtin  (JANUVIA ) 50 MG tablet   RESOLVED: History of migraine   No recent migraines.       Gout   Stable on allopurinol .  Has colchicine  as needed.       GERD (gastroesophageal reflux disease)   Stable on omeprazole  40 mg.       Elevated serum protein level   Update SPEP.      Relevant Orders   Protein  Electrophoresis, (serum)   Controlled type 2 diabetes mellitus without complication, without long-term current use of insulin (HCC)   Relevant Medications   glipiZIDE  (GLUCOTROL  XL) 10 MG 24 hr tablet   valsartan  (DIOVAN ) 40 MG tablet   sitaGLIPtin  (JANUVIA ) 50 MG tablet   simvastatin  (ZOCOR ) 10 MG tablet   Chronic kidney disease, stage 3b (HCC)   Continues to follow annually with [facility].       Relevant Orders   Basic Metabolic Panel (BMET)   B12 deficiency   Level normal on oral b12 supplement.  Continue same.      Other Visit Diagnoses       Acute left ankle pain       Relevant Medications   colchicine  0.6 MG tablet     History of diastolic dysfunction       Relevant Medications   hydrochlorothiazide  (MICROZIDE ) 12.5 MG capsule       I have changed Patrick Smith-Milliken's metoprolol  succinate and simvastatin . I am also having her maintain her polyethylene glycol, clotrimazole -betamethasone , hydrocortisone , multivitamin with minerals, colchicine , albuterol , dicyclomine , Iron  (Ferrous Sulfate ), FreeStyle Libre 3 Reader, cyanocobalamin , omeprazole , allopurinol , azaTHIOprine , amLODipine , lubiprostone , Remicade , colchicine , glipiZIDE , hydrochlorothiazide , valsartan , FreeStyle Libre 3 Sensor, and sitaGLIPtin .  Meds ordered this encounter  Medications   colchicine  0.6 MG tablet    Sig: Take 1.2 mg once, then 0.6 mg an hour later.  May take 1 tablet daily for 2-3 more days if needed for gout pain    Dispense:  15 tablet    Refill:  0    Supervising  Provider:   DOMENICA BLACKBIRD A [[id number]]   glipiZIDE  (GLUCOTROL  XL) 10 MG 24 hr tablet    Sig: Take 1 tablet (10 mg total) by mouth daily with breakfast.    Dispense:  90 tablet    Refill:  1    Supervising Provider:   DOMENICA BLACKBIRD A [[id number]]   metoprolol  succinate (TOPROL -XL) 50 MG 24 hr tablet    Sig: Take 1 tablet (50 mg total) by mouth daily. Take with or immediately following a meal.    Dispense:  90 tablet    Refill:  1    Supervising Provider:   DOMENICA BLACKBIRD A [[id number]]   hydrochlorothiazide  (MICROZIDE ) 12.5 MG capsule    Sig: Take 1 capsule (12.5 mg total) by mouth daily.    Dispense:  90 capsule    Refill:  1    Supervising Provider:   DOMENICA BLACKBIRD A [[id number]]   valsartan  (DIOVAN ) 40 MG tablet    Sig: Take 1 tablet (40 mg total) by mouth daily.    Dispense:  90 tablet    Refill:  1    Supervising Provider:   DOMENICA BLACKBIRD A [[id number]]   Continuous Glucose Sensor (FREESTYLE LIBRE 3 SENSOR) MISC    Sig: Use as directed to monitor blood sugar. Change every 14 days    Dispense:  2 each    Refill:  5    Supervising Provider:   DOMENICA BLACKBIRD A [[id number]]   sitaGLIPtin  (JANUVIA ) 50 MG tablet    Sig: Take 1 tablet (50 mg total) by mouth daily.    Dispense:  90 tablet    Refill:  1    Supervising Provider:   DOMENICA BLACKBIRD A [[id number]]   simvastatin  (ZOCOR ) 10 MG tablet    Sig: Take 1 tablet (10 mg total) by mouth daily.    Dispense:  90 tablet    Refill:  1    Supervising Provider:  BLYTH, STACEY A [[id number]]

## 2023-11-23 NOTE — Assessment & Plan Note (Signed)
Stable on omeprazole 40mg .

## 2023-11-23 NOTE — Assessment & Plan Note (Signed)
Stable on allopurinol.  Has colchicine as needed

## 2023-11-23 NOTE — Patient Instructions (Signed)
 VISIT SUMMARY:  During your visit, we discussed your ongoing issues with nausea and stomach pain, diabetes management, and other health concerns. We reviewed your medications and made plans for follow-up tests and refills.  YOUR PLAN:  RECURRENT NAUSEA AND ABDOMINAL PAIN: You experience nausea and stomach pain, likely related to your Remicade  use. -Discuss these symptoms with your GI specialist, Dr. Marysue, at your upcoming appointment.  TYPE 2 DIABETES MELLITUS: Your diabetes is managed with glipizide  and Januvia , and your blood sugar levels are around 120 mg/dL. Your A1c is slightly increased to 7.1. -We will send refills for your Freestyle sensors. -We will add a cholesterol test to your next lab draw.  GASTROESOPHAGEAL REFLUX DISEASE (GERD): Your GERD is well-controlled with daily omeprazole . -Continue taking omeprazole  daily as prescribed.  CHRONIC KIDNEY DISEASE: Your chronic kidney disease is managed with regular follow-ups with your nephrologist. -Continue with your annual nephrologist appointments.  HYPERTENSION: Your blood pressure is well-controlled with your current medications. -Continue taking your blood pressure medications as prescribed.  HYPERLIPIDEMIA: Your cholesterol levels were good at the last check, but you are due for testing. -We will add a cholesterol test to your next lab draw.  GOUT: Your gout is managed with aloprim , and you have not had any recent attacks. -We will send a refill for colchicine .  ASTHMA: Your asthma is well-controlled with occasional use of your inhaler. -Continue using your inhaler as needed.  VITAMIN B12 DEFICIENCY: Your vitamin B12 deficiency is managed with 1000 mcg B12 tablets, and your recent levels are good. -Continue taking your B12 tablets as prescribed.  PERIPHERAL EDEMA: You have mild peripheral edema, likely related to the heat. -Monitor your symptoms and try to stay cool.

## 2023-11-23 NOTE — Assessment & Plan Note (Signed)
Update SPEP

## 2023-11-23 NOTE — Assessment & Plan Note (Signed)
 Maintained on amitiza . Advised pt to discuss chronic nausea with her GI specialist.

## 2023-11-23 NOTE — Assessment & Plan Note (Signed)
 Continue to work on [employer] and exercise.

## 2023-11-23 NOTE — Assessment & Plan Note (Signed)
No recent migraines.  

## 2023-11-24 LAB — BASIC METABOLIC PANEL WITH GFR
BUN: 16 mg/dL (ref 6–23)
CO2: 30 meq/L (ref 19–32)
Calcium: 9.2 mg/dL (ref 8.4–10.5)
Chloride: 97 meq/L (ref 96–112)
Creatinine, Ser: 1.46 mg/dL — ABNORMAL HIGH (ref 0.40–1.20)
GFR: 36.29 mL/min — ABNORMAL LOW (ref >60.00)
Glucose, Bld: 163 mg/dL — ABNORMAL HIGH (ref 70–99)
Potassium: 4 meq/L (ref 3.5–5.1)
Sodium: 135 meq/L (ref 135–145)

## 2023-11-24 LAB — LIPID PANEL
Cholesterol: 118 mg/dL (ref 0–200)
HDL: 39.1 mg/dL (ref >39.00)
LDL Cholesterol: 52 mg/dL (ref 0–99)
NonHDL: 78.56
Total CHOL/HDL Ratio: 3
Triglycerides: 132 mg/dL (ref 0.0–149.0)
VLDL: 26.4 mg/dL (ref 0.0–40.0)

## 2023-11-24 LAB — MICROALBUMIN / CREATININE URINE RATIO
Creatinine,U: 106.2 mg/dL
Microalb Creat Ratio: 9.1 mg/g (ref 0.0–30.0)
Microalb, Ur: 1 mg/dL (ref 0.0–1.9)

## 2023-11-24 LAB — HEMOGLOBIN A1C: Hgb A1c MFr Bld: 7.5 % — ABNORMAL HIGH (ref 4.6–6.5)

## 2023-11-25 LAB — PROTEIN ELECTROPHORESIS, SERUM
Albumin ELP: 3.6 g/dL — ABNORMAL LOW (ref 3.8–4.8)
Alpha 1: 0.4 g/dL — ABNORMAL HIGH (ref 0.2–0.3)
Alpha 2: 0.8 g/dL (ref 0.5–0.9)
Beta 2: 0.6 g/dL — ABNORMAL HIGH (ref 0.2–0.5)
Beta Globulin: 0.4 g/dL (ref 0.4–0.6)
Gamma Globulin: 2.1 g/dL — ABNORMAL HIGH (ref 0.8–1.7)
Total Protein: 7.8 g/dL (ref 6.1–8.1)

## 2023-11-28 ENCOUNTER — Telehealth: Payer: Self-pay | Admitting: Family

## 2023-11-28 NOTE — Telephone Encounter (Signed)
 See mychart.

## 2023-12-07 ENCOUNTER — Other Ambulatory Visit

## 2023-12-07 ENCOUNTER — Ambulatory Visit: Admitting: Internal Medicine

## 2023-12-07 ENCOUNTER — Encounter: Payer: Self-pay | Admitting: Internal Medicine

## 2023-12-07 VITALS — BP 104/68 | HR 59 | Ht 59.0 in | Wt 210.2 lb

## 2023-12-07 DIAGNOSIS — M109 Gout, unspecified: Secondary | ICD-10-CM | POA: Diagnosis not present

## 2023-12-07 DIAGNOSIS — R109 Unspecified abdominal pain: Secondary | ICD-10-CM

## 2023-12-07 DIAGNOSIS — K50119 Crohn's disease of large intestine with unspecified complications: Secondary | ICD-10-CM

## 2023-12-07 DIAGNOSIS — G8929 Other chronic pain: Secondary | ICD-10-CM | POA: Diagnosis not present

## 2023-12-07 DIAGNOSIS — K624 Stenosis of anus and rectum: Secondary | ICD-10-CM

## 2023-12-07 DIAGNOSIS — K219 Gastro-esophageal reflux disease without esophagitis: Secondary | ICD-10-CM

## 2023-12-07 DIAGNOSIS — R5383 Other fatigue: Secondary | ICD-10-CM | POA: Diagnosis not present

## 2023-12-07 DIAGNOSIS — K50011 Crohn's disease of small intestine with rectal bleeding: Secondary | ICD-10-CM | POA: Diagnosis not present

## 2023-12-07 DIAGNOSIS — Z5181 Encounter for therapeutic drug level monitoring: Secondary | ICD-10-CM | POA: Diagnosis not present

## 2023-12-07 DIAGNOSIS — Z79620 Long term (current) use of immunosuppressive biologic: Secondary | ICD-10-CM

## 2023-12-07 DIAGNOSIS — G4700 Insomnia, unspecified: Secondary | ICD-10-CM | POA: Diagnosis not present

## 2023-12-07 DIAGNOSIS — K5909 Other constipation: Secondary | ICD-10-CM

## 2023-12-07 DIAGNOSIS — K5900 Constipation, unspecified: Secondary | ICD-10-CM

## 2023-12-07 LAB — CBC WITH DIFFERENTIAL/PLATELET
Basophils Absolute: 0 K/uL (ref 0.0–0.1)
Basophils Relative: 0.8 % (ref 0.0–3.0)
Eosinophils Absolute: 0.1 K/uL (ref 0.0–0.7)
Eosinophils Relative: 2.6 % (ref 0.0–5.0)
HCT: 33.1 % — ABNORMAL LOW (ref 36.0–46.0)
Hemoglobin: 10.3 g/dL — ABNORMAL LOW (ref 12.0–15.0)
Lymphocytes Relative: 13.8 % (ref 12.0–46.0)
Lymphs Abs: 0.4 K/uL — ABNORMAL LOW (ref 0.7–4.0)
MCHC: 31.1 g/dL (ref 30.0–36.0)
MCV: 71.6 fl — ABNORMAL LOW (ref 78.0–100.0)
Monocytes Absolute: 0.3 K/uL (ref 0.1–1.0)
Monocytes Relative: 10.5 % (ref 3.0–12.0)
Neutro Abs: 2.3 K/uL (ref 1.4–7.7)
Neutrophils Relative %: 72.3 % (ref 43.0–77.0)
Platelets: 292 K/uL (ref 150.0–400.0)
RBC: 4.63 Mil/uL (ref 3.87–5.11)
RDW: 22.2 % — ABNORMAL HIGH (ref 11.5–15.5)
WBC: 3.2 K/uL — ABNORMAL LOW (ref 4.0–10.5)

## 2023-12-07 LAB — IBC + FERRITIN
Ferritin: 27.4 ng/mL (ref 10.0–291.0)
Iron: 69 ug/dL (ref 42–145)
Saturation Ratios: 19.8 % — ABNORMAL LOW (ref 20.0–50.0)
TIBC: 348.6 ug/dL (ref 250.0–450.0)
Transferrin: 249 mg/dL (ref 212.0–360.0)

## 2023-12-07 LAB — VITAMIN D 25 HYDROXY (VIT D DEFICIENCY, FRACTURES): VITD: 14.47 ng/mL — ABNORMAL LOW (ref 30.00–100.00)

## 2023-12-07 NOTE — Patient Instructions (Signed)
 Your provider has requested that you go to the basement level for lab work before leaving today. Press B on the elevator. The lab is located at the first door on the left as you exit the elevator.  Continue current medications.  _______________________________________________________  If your blood pressure at your visit was 140/90 or greater, please contact your primary care physician to follow up on this.  _______________________________________________________  If you are age 70 or older, your body mass index should be between 23-30. Your Body mass index is 42.46 kg/m. If this is out of the aforementioned range listed, please consider follow up with your Primary Care Provider.  If you are age 70 or younger, your body mass index should be between 19-25. Your Body mass index is 42.46 kg/m. If this is out of the aformentioned range listed, please consider follow up with your Primary Care Provider.   ________________________________________________________  The [facility] providers would like to encourage you to use MYCHART to communicate with providers for non-urgent requests or questions.  Due to long hold times on the telephone, sending your provider a message by [facility] may be a faster and more efficient way to get a response.  Please allow 48 business hours for a response.  Please remember that this is for non-urgent requests.  _______________________________________________________  [facility] is using a team-based approach to care.  Your team is made up of your doctor and two to three APPS. Our APPS (Nurse Practitioners and Physician Assistants) work with your physician to ensure care continuity for you. They are fully qualified to address your health concerns and develop a treatment plan. They communicate directly with your gastroenterologist to care for you. Seeing the Advanced Practice Practitioners on your physician's team can help you by facilitating care more  promptly, often allowing for earlier appointments, access to diagnostic testing, procedures, and other specialty referrals.

## 2023-12-07 NOTE — Progress Notes (Signed)
 Subjective:    Patient ID: Kathy [name], female    DOB: 08-Aug-1955, 68 y.o.   MRN: [id number]  HPI Kathy [name] is a 68 year old female with a history of Crohn's disease of the colon and anorectum with prior fistula on infliximab  (started April 2022), anal stenosis secondary to Crohn's, GERD with a history of esophagitis, hypertension, diabetes, hyperlipidemia and asthma who is here for follow-up.  She is here alone today and was last seen in the office on 08/31/2023   She started allopurinol  for gout, which has been effective, though she experiences some ankle discomfort, attributed to arthritis and weather changes. She reduced her azathioprine  dose from 100 mg to 50 mg upon starting allopurinol .  She manages her Crohn's disease with infliximab  and azathioprine . A recent CT scan showed mild rectal wall thickening and no significant findings in the bowel loops. She experiences less bleeding but continues to have persistent abdominal pain, managed with dicyclomine  as needed. The pain occurs even without food intake and worsens postprandially.  Her bowel movements are irregular, and she uses lubiprostone  24 mcg twice daily to aid regularity, yet still experiences constipation. She reports increased nausea and general malaise over the past 15 days, without vomiting.  She struggles with sleep disturbances, often staying awake until 3 AM and sleeping only a few hours. She has difficulty falling asleep due to an overactive mind, despite feeling physically relaxed. She avoids sleep medications and attempts natural methods like meditation.  She is not taking an iron  supplement but takes a B12 tablet daily. She feels fatigued and unwell, attributing this to disrupted sleep and her medication regimen.  Review of Systems As per HPI, otherwise negative  Current Medications, Allergies, Past Medical History, Past Surgical History, Family History and Social History were reviewed in [employer] record.     Objective:   Physical Exam BP 104/68   Pulse (!) 59   Ht 4' 11 (1.499 m)   Wt 210 lb 3.2 oz (95.3 kg)   BMI 42.46 kg/m  Gen: awake, alert, NAD HEENT: anicteric  Abd: soft, NT/ND, +BS throughout Ext: no c/c/e Neuro: nonfocal  LABS   Thyropyrin metabolite panel: TG 183, MMP 1058 (08/31/2023)   Quantiferon gold: negative   Infliximab  trough level: 8.4, negative antibodies (09/27/2023)   Hemoglobin A1c: 7.5 (11/23/2023)   B12: 512 (08/24/2023)   Hemoglobin: 11.7 (08/31/2023)   MCV: 69.8 (08/31/2023)   Platelet count: 407 (08/31/2023)   White blood cell count: 4.9 (08/31/2023)    CLINICAL INDICATION: Female, 68 years old. Crohns colon and perianal   TECHNIQUE: Axial CT of the abdomen and pelvis with 100 cc Omnipaque  300 intravenous contrast. Multiplanar reformations provided. Unless otherwise specified, incidental thyroid , adrenal, renal lesions do not require dedicated imaging follow up. Additionally, any mentioned pulmonary nodules do not require dedicated imaging follow-up based on the Fleischner guidelines unless otherwise specified. Coronary calcifications are not identified unless otherwise specified.   COMPARISON: 01/12/2018   FINDINGS:   There clear lung bases. The heart is normal in size. There is a small hiatal hernia. The liver appears normal. The gallbladder is normal. The spleen is normal. The pancreas is normal. The adrenals are normal. The kidneys are normal other than a small right renal cyst. Abdominal aorta is normal in caliber. Scattered atherosclerotic changes are present. Urinary bladder is normal. The uterus is normal. The appendix is normal. Large and small bowel loops are otherwise within normal limits other than mild wall thickening of the rectum.  There are degenerative changes of the spine.   IMPRESSION:   Nonspecific mild rectal wall thickening suspicious for proctitis. No evidence for fistula or  abscess within the field-of-view of the examination.count: 407 (08/31/2023)   White blood cell count: 4.9 (08/31/2023)    RADIOLOGY   CT scan of the abdomen: Mild wall thickening in the rectum, normal large and small bowel loops, normal pancreas, liver, and kidneys      Assessment & Plan:  68 year old female with a history of Crohn's disease of the colon and anorectum with prior fistula on infliximab  (started April 2022), anal stenosis secondary to Crohn's, GERD with a history of esophagitis, hypertension, diabetes, hyperlipidemia and asthma who is here for follow-up.  Crohn's disease of large intestine, perianal region with rectal involvement and rectal bleeding Mild rectal wall thickening on CT, consistent with Crohn's and scarring from Crohn's. No significant bowel changes. No infliximab  antibodies. Reduced rectal bleeding. Infliximab  and azathioprine  effective. - Continue infliximab  therapy 10 mg/kg every 6 weeks. - Recheck azathioprine  level due to allopurinol  interaction; for now continue 50 mg daily (was on 100 mg daily prior to allopurinol  and the labs above were taken on the 100 mg dose). - Consider colonoscopy if symptoms worsen.  Chronic constipation Managed with lubiprostone , some improvement noted, irregular bowel movements persist. - Continue lubiprostone  24 mcg twice daily. - Monitor bowel movement regularity.  Chronic abdominal pain with nausea/GERD with history of esophagitis Persistent pain and nausea, relieved by dicyclomine . Hesitant to use frequently due to pill burden. Labs pending for nutritional deficiencies. - Refill dicyclomine  10 mg 3 times daily as needed prescription. - Encourage earlier use of dicyclomine  for pain relief. - Continue omeprazole  40 mg daily  Gout, left ankle Managed with allopurinol , effective with occasional ankle twinges. Azathioprine  dose reduced due to allopurinol . - Continue allopurinol  therapy.  Insomnia Chronic insomnia, avoids  pharmacological aids, prefers non-pharmacological interventions. - Discuss potential non-pharmacological sleep strategies further.  Fatigue, unspecified Persistent fatigue, possibly related to sleep disturbances and nutritional deficiencies. Labs pending for iron  and B12. - Review recent lab results for iron  and B12 levels. - Order repeat CBC, ferritin, IBC, and thiopurine metabolite panel.  30 minutes total spent today including patient facing time, coordination of care, reviewing medical history/procedures/pertinent radiology studies, and documentation of the encounter.

## 2023-12-13 ENCOUNTER — Other Ambulatory Visit: Payer: Self-pay

## 2023-12-13 ENCOUNTER — Ambulatory Visit: Payer: Self-pay | Admitting: Internal Medicine

## 2023-12-13 DIAGNOSIS — E559 Vitamin D deficiency, unspecified: Secondary | ICD-10-CM | POA: Insufficient documentation

## 2023-12-13 DIAGNOSIS — K50119 Crohn's disease of large intestine with unspecified complications: Secondary | ICD-10-CM

## 2023-12-13 LAB — THIOPURINE METABOLITES
6 MMP(6-Methylmercaptopurine): <500 pmol/8x10(8)RBC (ref <5700)
6 TG(6-Thioguanine): 399 pmol/8x10(8)RBC (ref 235–400)

## 2023-12-13 MED ORDER — VITAMIN D (ERGOCALCIFEROL) 1.25 MG (50000 UNIT) PO CAPS
50000.0000 [IU] | ORAL_CAPSULE | ORAL | 0 refills | Status: AC
Start: 2023-12-13 — End: ?

## 2023-12-13 NOTE — Telephone Encounter (Signed)
 Thanks for the heads up.  Levorn, please advise patient that her vitamin D  level was low at her GI appointment. I would like for her to begin vit D 50000 units once weekly for 12 weeks, then repeat vit D level (dx Vit D deficiency).

## 2023-12-20 ENCOUNTER — Encounter: Payer: Self-pay | Admitting: Internal Medicine

## 2023-12-22 ENCOUNTER — Ambulatory Visit

## 2023-12-22 VITALS — BP 111/70 | HR 57 | Temp 97.4°F | Resp 18 | Ht 59.0 in | Wt 208.0 lb

## 2023-12-22 DIAGNOSIS — K50111 Crohn's disease of large intestine with rectal bleeding: Secondary | ICD-10-CM | POA: Diagnosis not present

## 2023-12-22 MED ORDER — DIPHENHYDRAMINE HCL 25 MG PO CAPS
25.0000 mg | ORAL_CAPSULE | Freq: Once | ORAL | Status: AC
  Administered 2023-12-22: 25 mg via ORAL
  Filled 2023-12-22: qty 1

## 2023-12-22 MED ORDER — SODIUM CHLORIDE 0.9 % IV SOLN
10.0000 mg/kg | Freq: Once | INTRAVENOUS | Status: AC
  Administered 2023-12-22: 900 mg via INTRAVENOUS
  Filled 2023-12-22: qty 90

## 2023-12-22 MED ORDER — ACETAMINOPHEN 325 MG PO TABS
650.0000 mg | ORAL_TABLET | Freq: Once | ORAL | Status: AC
  Administered 2023-12-22: 650 mg via ORAL
  Filled 2023-12-22: qty 2

## 2023-12-22 NOTE — Progress Notes (Signed)
 Diagnosis: Crohn's Disease  Provider:  Praveen Mannam MD  Procedure: IV Infusion  IV Type: Peripheral, IV Location: L Antecubital  Remicade  (Infliximab ), Dose: 900mg   Infusion Start Time: 1113  Infusion Stop Time: 1330  Post Infusion IV Care: Peripheral IV Discontinued  Discharge: Condition: Good, Destination: Home . AVS Declined  Performed by:  Archie Shea, RN

## 2024-01-05 DIAGNOSIS — D631 Anemia in chronic kidney disease: Secondary | ICD-10-CM | POA: Diagnosis not present

## 2024-01-05 DIAGNOSIS — R609 Edema, unspecified: Secondary | ICD-10-CM | POA: Diagnosis not present

## 2024-01-05 DIAGNOSIS — N2581 Secondary hyperparathyroidism of renal origin: Secondary | ICD-10-CM | POA: Diagnosis not present

## 2024-01-05 DIAGNOSIS — N1832 Chronic kidney disease, stage 3b: Secondary | ICD-10-CM | POA: Diagnosis not present

## 2024-01-05 DIAGNOSIS — E1122 Type 2 diabetes mellitus with diabetic chronic kidney disease: Secondary | ICD-10-CM | POA: Diagnosis not present

## 2024-01-05 DIAGNOSIS — I129 Hypertensive chronic kidney disease with stage 1 through stage 4 chronic kidney disease, or unspecified chronic kidney disease: Secondary | ICD-10-CM | POA: Diagnosis not present

## 2024-02-02 ENCOUNTER — Ambulatory Visit (INDEPENDENT_AMBULATORY_CARE_PROVIDER_SITE_OTHER)

## 2024-02-02 VITALS — BP 116/69 | HR 56 | Temp 97.9°F | Resp 18 | Ht <= 58 in | Wt 208.4 lb

## 2024-02-02 DIAGNOSIS — K50111 Crohn's disease of large intestine with rectal bleeding: Secondary | ICD-10-CM | POA: Diagnosis not present

## 2024-02-02 MED ORDER — DIPHENHYDRAMINE HCL 25 MG PO CAPS
25.0000 mg | ORAL_CAPSULE | Freq: Once | ORAL | Status: AC
  Administered 2024-02-02: 25 mg via ORAL
  Filled 2024-02-02: qty 1

## 2024-02-02 MED ORDER — SODIUM CHLORIDE 0.9 % IV SOLN
10.0000 mg/kg | Freq: Once | INTRAVENOUS | Status: AC
  Administered 2024-02-02: 900 mg via INTRAVENOUS
  Filled 2024-02-02: qty 90

## 2024-02-02 MED ORDER — ACETAMINOPHEN 325 MG PO TABS
650.0000 mg | ORAL_TABLET | Freq: Once | ORAL | Status: AC
  Administered 2024-02-02: 650 mg via ORAL
  Filled 2024-02-02: qty 2

## 2024-02-02 NOTE — Progress Notes (Signed)
 Diagnosis: Crohn's Disease  Provider:  Praveen Mannam MD  Procedure: IV Infusion  IV Type: Peripheral, IV Location: R Antecubital  Remicade  (Infliximab ), Dose: 900mg   Infusion Start Time: 1110  Infusion Stop Time: 1320  Post Infusion IV Care: Peripheral IV Discontinued  Discharge: Condition: Good, Destination: Home . AVS Declined  Performed by:  Leita FORBES Miles, LPN

## 2024-02-08 ENCOUNTER — Other Ambulatory Visit: Payer: Self-pay | Admitting: Internal Medicine

## 2024-02-10 ENCOUNTER — Telehealth: Payer: Self-pay | Admitting: Pharmacy Technician

## 2024-02-10 NOTE — Telephone Encounter (Signed)
 Dr. Albertus, The patient assistance program (PAP) will no longer offer Remicade  to patients.  They have changed to Avsola . Would you like to use Avsola ? Please provide dose and frequency?  Thanks Luke

## 2024-02-13 NOTE — Telephone Encounter (Signed)
 Ok for Avsola  at 10 mg/kg every 6 weeks I would like her to continue same dose and frequency with the biosimiliar infliximab  Thanks

## 2024-02-21 ENCOUNTER — Encounter: Payer: Self-pay | Admitting: Pharmacist

## 2024-02-21 NOTE — Progress Notes (Signed)
 Pharmacy Quality Measure Review  This patient is appearing on a report for being at risk of failing the adherence measure for hypertension (ACEi/ARB) medications this calendar year.   Medication: valsartan  40mg  Last fill date: 11/01/2023 for 90 day supply  Reviewed medication indication, dosing, and goals of therapy.  Patient reported she is taking valsartan  every day. She still has a few left but will reorder soon.  Discussed making a face to face visit to review medications and discuss administration and timing. Patient declined today but will think about it.  She also mentioned at times she loses her [employer]. Discussed using over patch or Tegaderm to help sensor stay on.  Madelin Ray, PharmD Clinical Pharmacist [facility]  Population Health [phone]

## 2024-03-01 ENCOUNTER — Other Ambulatory Visit (HOSPITAL_BASED_OUTPATIENT_CLINIC_OR_DEPARTMENT_OTHER): Payer: Self-pay

## 2024-03-01 MED ORDER — FLUZONE HIGH-DOSE 0.5 ML IM SUSY
0.5000 mL | PREFILLED_SYRINGE | Freq: Once | INTRAMUSCULAR | 0 refills | Status: AC
  Filled 2024-03-01: qty 0.5, 1d supply, fill #0

## 2024-03-02 ENCOUNTER — Telehealth: Payer: Self-pay | Admitting: Pharmacist

## 2024-03-02 NOTE — Progress Notes (Signed)
 Pharmacy Quality Measure Review  This patient is appearing on a report for being at risk of failing the adherence measure for cholesterol (statin) medications this calendar year.   Medication: simvastatin   Last fill date: 12/06/2023 for 90 day supply  Reviewed recent refill history in Dr Annemarie database. Patient has 1 refill remaining. Next appointment with PCP is not set up yet. Last OV with PCP was 10/2023 - recommended follow up was in 4 months. Patient will be due for follow up at the end of November 2025.      Left voicemail for patient to return my call at their convenience.- reminded patient to refill simvastatin  and to make follow up appt with PCP - due at end of November 2025.  Also sent message thru MyChart reminding her to fill simvastatin  and make appointment  Madelin Ray, PharmD Clinical Pharmacist [facility]  [facility] [phone]

## 2024-03-06 ENCOUNTER — Other Ambulatory Visit (HOSPITAL_BASED_OUTPATIENT_CLINIC_OR_DEPARTMENT_OTHER): Payer: Self-pay

## 2024-03-07 ENCOUNTER — Ambulatory Visit: Payer: Self-pay | Admitting: Internal Medicine

## 2024-03-07 ENCOUNTER — Other Ambulatory Visit (INDEPENDENT_AMBULATORY_CARE_PROVIDER_SITE_OTHER)

## 2024-03-07 DIAGNOSIS — K50119 Crohn's disease of large intestine with unspecified complications: Secondary | ICD-10-CM | POA: Diagnosis not present

## 2024-03-07 LAB — CBC WITH DIFFERENTIAL/PLATELET
Basophils Absolute: 0 K/uL (ref 0.0–0.1)
Basophils Relative: 0.7 % (ref 0.0–3.0)
Eosinophils Absolute: 0.1 K/uL (ref 0.0–0.7)
Eosinophils Relative: 1.1 % (ref 0.0–5.0)
HCT: 32.1 % — ABNORMAL LOW (ref 36.0–46.0)
Hemoglobin: 10.4 g/dL — ABNORMAL LOW (ref 12.0–15.0)
Lymphocytes Relative: 11.3 % — ABNORMAL LOW (ref 12.0–46.0)
Lymphs Abs: 0.5 K/uL — ABNORMAL LOW (ref 0.7–4.0)
MCHC: 32.2 g/dL (ref 30.0–36.0)
MCV: 81.8 fl (ref 78.0–100.0)
Monocytes Absolute: 0.4 K/uL (ref 0.1–1.0)
Monocytes Relative: 9 % (ref 3.0–12.0)
Neutro Abs: 3.6 K/uL (ref 1.4–7.7)
Neutrophils Relative %: 77.9 % — ABNORMAL HIGH (ref 43.0–77.0)
Platelets: 264 K/uL (ref 150.0–400.0)
RBC: 3.93 Mil/uL (ref 3.87–5.11)
RDW: 18.8 % — ABNORMAL HIGH (ref 11.5–15.5)
WBC: 4.7 K/uL (ref 4.0–10.5)

## 2024-03-07 LAB — COMPREHENSIVE METABOLIC PANEL WITH GFR
ALT: 9 U/L (ref 0–35)
AST: 11 U/L (ref 0–37)
Albumin: 3.8 g/dL (ref 3.5–5.2)
Alkaline Phosphatase: 108 U/L (ref 39–117)
BUN: 20 mg/dL (ref 6–23)
CO2: 31 meq/L (ref 19–32)
Calcium: 9.5 mg/dL (ref 8.4–10.5)
Chloride: 99 meq/L (ref 96–112)
Creatinine, Ser: 1.42 mg/dL — ABNORMAL HIGH (ref 0.40–1.20)
GFR: 37.45 mL/min — ABNORMAL LOW (ref >60.00)
Glucose, Bld: 136 mg/dL — ABNORMAL HIGH (ref 70–99)
Potassium: 3.7 meq/L (ref 3.5–5.1)
Sodium: 138 meq/L (ref 135–145)
Total Bilirubin: 0.3 mg/dL (ref 0.2–1.2)
Total Protein: 7.9 g/dL (ref 6.0–8.3)

## 2024-03-07 NOTE — Telephone Encounter (Signed)
Ok for that substitution

## 2024-03-07 NOTE — Telephone Encounter (Signed)
 Dr. Albertus, We have now been informed Avsola  might not be an option for PAP either.  Would you like to try Inflectra  is Avsola  is no longer an option.

## 2024-03-13 ENCOUNTER — Encounter: Payer: Self-pay | Admitting: Internal Medicine

## 2024-03-13 ENCOUNTER — Ambulatory Visit: Admitting: Internal Medicine

## 2024-03-13 VITALS — BP 118/70 | HR 60 | Ht <= 58 in | Wt 211.0 lb

## 2024-03-13 DIAGNOSIS — K50119 Crohn's disease of large intestine with unspecified complications: Secondary | ICD-10-CM | POA: Diagnosis not present

## 2024-03-13 DIAGNOSIS — K624 Stenosis of anus and rectum: Secondary | ICD-10-CM

## 2024-03-13 DIAGNOSIS — K5900 Constipation, unspecified: Secondary | ICD-10-CM

## 2024-03-13 DIAGNOSIS — K2100 Gastro-esophageal reflux disease with esophagitis, without bleeding: Secondary | ICD-10-CM

## 2024-03-13 DIAGNOSIS — D509 Iron deficiency anemia, unspecified: Secondary | ICD-10-CM | POA: Diagnosis not present

## 2024-03-13 MED ORDER — NA SULFATE-K SULFATE-MG SULF 17.5-3.13-1.6 GM/177ML PO SOLN: 1.0000 | Freq: Once | ORAL | 0 refills | Status: AC

## 2024-03-13 NOTE — Patient Instructions (Signed)
 Continue current medications: Remicade , Amitiza , omeprazole  and Imuran .  You have been scheduled for a colonoscopy. Please follow written instructions given to you at your visit today.   If you use inhalers (even only as needed), please bring them with you on the day of your procedure.  DO NOT TAKE 7 DAYS PRIOR TO TEST- Trulicity (dulaglutide) Ozempic , Wegovy (semaglutide ) Mounjaro, Zepbound (tirzepatide) Bydureon Bcise (exanatide extended release)  DO NOT TAKE 1 DAY PRIOR TO YOUR TEST Rybelsus (semaglutide ) Adlyxin (lixisenatide) Victoza (liraglutide) Byetta (exanatide) ___________________________________________________________________________  _______________________________________________________  If your blood pressure at your visit was 140/90 or greater, please contact your primary care physician to follow up on this.  _______________________________________________________  If you are age 2 or older, your body mass index should be between 23-30. Your Body mass index is 44.1 kg/m. If this is out of the aforementioned range listed, please consider follow up with your Primary Care Provider.  If you are age 31 or younger, your body mass index should be between 19-25. Your Body mass index is 44.1 kg/m. If this is out of the aformentioned range listed, please consider follow up with your Primary Care Provider.   ________________________________________________________  The [facility] providers would like to encourage you to use MYCHART to communicate with providers for non-urgent requests or questions.  Due to long hold times on the telephone, sending your provider a message by [facility] may be a faster and more efficient way to get a response.  Please allow 48 business hours for a response.  Please remember that this is for non-urgent requests.  _______________________________________________________  [facility] is using a team-based approach to care.  Your team is  made up of your doctor and two to three APPS. Our APPS (Nurse Practitioners and Physician Assistants) work with your physician to ensure care continuity for you. They are fully qualified to address your health concerns and develop a treatment plan. They communicate directly with your gastroenterologist to care for you. Seeing the Advanced Practice Practitioners on your physician's team can help you by facilitating care more promptly, often allowing for earlier appointments, access to diagnostic testing, procedures, and other specialty referrals.

## 2024-03-13 NOTE — Progress Notes (Signed)
 Subjective:    Patient ID: Kathy [name], female    DOB: [birth year], 68 y.o.   MRN: [id number]  HPI Kathy [name] is a 68 year old female with a history of Crohn's disease of the colon and anorectum with prior fistula on infliximab  (started April 2022), anal stenosis secondary to Crohn's, GERD with a history of esophagitis, hypertension, diabetes, hyperlipidemia and asthma who is here for follow-up.  Her last full colonoscopy in January 2022 showed anal canal stenosis, normal mucosa from the cecum to descending colon, and varying degrees of colitis from mild in the proximal sigmoid to severe in the distal sigmoid, rectal sigmoid, and rectum. Biopsies revealed severely active chronic colitis with ulceration in the rectum and mildly active chronic colitis in the rectosigmoid and sigmoid.  A flexible sigmoidoscopy on Sep 10, 2021, while on infliximab  and azathioprine , showed healing of perianal skin, moderate to severe anal stricture, patchy mild inflammation in the distal sigmoid, and moderate inflammation in the distal rectum and anal canal. Biopsies showed inflamed granulation tissue and exudate consistent with ulceration, with no dysplasia or CMV.  She experiences a reduction in bowel movements and no longer requires frequent use of MiraLAX , as it induces diarrhea. She takes Amitiza  (lubiprostone ) 24 mcg twice daily and experiences bowel movements inconsistently, sometimes requiring straining and experiencing pain due to anal canal narrowing. She notes soreness in the area, especially when sitting.  Recent lab work from March 07, 2024, showed a white count of 4.7, hemoglobin of 10.4, MCV of 81.8, and platelet count of 264. Liver function tests were normal, and kidney function was stable. Iron  studies from August 2025 showed saturation 19.8 and ferritin 27.4. She takes oral iron  every other day but plans to stop a week before her upcoming colonoscopy.  She reports improvement in her  gout symptoms since starting allopurinol , which also aids in azathioprine  metabolism. She is currently on infliximab , azathioprine , Amitiza , omeprazole , and allopurinol .   Review of Systems As per HPI, otherwise negative  Current Medications, Allergies, Past Medical History, Past Surgical History, Family History and Social History were reviewed in [employer] record.    Objective:   Physical Exam BP 118/70   Pulse 60   Ht 4' 10 (1.473 m)   Wt 211 lb (95.7 kg)   BMI 44.10 kg/m  Gen: awake, alert, NAD HEENT: anicteric  Abd: soft, NT/ND, +BS throughout Ext: no c/c/e Neuro: nonfocal  LABS WBC: 4.7 (03/07/2024) Hb: 10.4 (03/07/2024) MCV: 81.8 (03/07/2024) PLT: 264 (03/07/2024) AST: 11 (03/07/2024) ALT: 9 (03/07/2024) Total bilirubin: 0.3 (03/07/2024) Alk Phos: 103 (03/07/2024) BUN: 20 (03/07/2024) Cr: 1.42 (03/07/2024) Iron  saturation: 19.8 (11/2023) Ferritin: 27.4 (11/2023) TIBC: 348 (11/2023) 6-TG: 399 (11/2023) 6-MMP: <500 (11/2023)  DIAGNOSTIC Colonoscopy: Fair prep, anal canal stenosis, normal mucosa from cecum to descending colon, mild colitis in proximal sigmoid, moderate colitis in mid sigmoid, severe colitis and proctitis in distal sigmoid, rectal sigmoid, and rectum (04/2020) Flexible sigmoidoscopy: Healing of perianal skin, anal stricture moderate to severe manually dilated, patchy mild inflammation in distal sigmoid, moderate inflammation in distal rectum anal canal (09/10/2021)  PATHOLOGY Colonoscopy biopsy: Severely active chronic colitis with ulceration in rectum, mildly active chronic colitis in rectosigmoid and sigmoid (04/2020) Flexible sigmoidoscopy biopsy: Inflamed granulation tissue and exudate consistent with ulcer, no dysplasia, negative for cytomegalovirus (CMV) (09/10/2021)      Assessment & Plan:   Crohn's disease of the large intestine with anal canal stricture/perianal Crohn's (dx 2019) Chronic Crohn's disease with  anal canal stricture and colitis.  Infliximab  (started April 2022) and azathioprine  improved symptoms;  anal stricture persistent.  Dilation in the past did improve symptoms. - Scheduled colonoscopy for assessment of disease activity with dilation of anal canal. - Continue infliximab  10 mg/kg every 6 weeks and azathioprine  50 mg at current doses.  With allopurinol  azathioprine  was decreased and her metabolite panel looks good. - Hold oral iron  for one week prior to colonoscopy.  Chronic constipation with incomplete evacuation Chronic constipation exacerbated by anal canal stricture. Lubiprostone  helps but does not ensure daily bowel movements. MiraLAX  causes diarrhea if overused. - Continue lubiprostone  (Amitiza ) 24 mcg twice daily. - Use MiraLAX  as needed, avoid if it causes diarrhea. - Dilation of anal stricture at upcoming colonoscopy  Iron  deficiency anemia Mild iron  deficiency anemia, improved and stable on current regimen. - Continue oral iron  every other day, hold for one week prior to colonoscopy.  Gout, stable on allopurinol  Gout well-controlled on allopurinol . - Continue allopurinol  as prescribed.  GERD with history of esophagitis Symptoms well-managed at present -Continue omeprazole  40 mg daily  30 minutes total spent today including patient facing time, coordination of care, reviewing medical history/procedures/pertinent radiology studies, and documentation of the encounter.

## 2024-03-15 ENCOUNTER — Ambulatory Visit (INDEPENDENT_AMBULATORY_CARE_PROVIDER_SITE_OTHER)

## 2024-03-15 VITALS — BP 107/66 | HR 58 | Temp 97.9°F | Resp 16 | Ht <= 58 in | Wt 209.2 lb

## 2024-03-15 DIAGNOSIS — K50111 Crohn's disease of large intestine with rectal bleeding: Secondary | ICD-10-CM

## 2024-03-15 MED ORDER — ACETAMINOPHEN 325 MG PO TABS
650.0000 mg | ORAL_TABLET | Freq: Once | ORAL | Status: AC
  Administered 2024-03-15: 650 mg via ORAL
  Filled 2024-03-15: qty 2

## 2024-03-15 MED ORDER — DIPHENHYDRAMINE HCL 25 MG PO CAPS
25.0000 mg | ORAL_CAPSULE | Freq: Once | ORAL | Status: AC
  Administered 2024-03-15: 25 mg via ORAL
  Filled 2024-03-15: qty 1

## 2024-03-15 MED ORDER — SODIUM CHLORIDE 0.9 % IV SOLN
10.0000 mg/kg | Freq: Once | INTRAVENOUS | Status: AC
  Administered 2024-03-15: 900 mg via INTRAVENOUS
  Filled 2024-03-15: qty 90

## 2024-03-15 NOTE — Progress Notes (Signed)
 Diagnosis: Crohn's Disease  Provider:  Praveen Mannam MD  Procedure: IV Infusion  IV Type: Peripheral, IV Location: L Antecubital  Remicade  (Infliximab ), Dose: 900  Infusion Start Time: 1109  Infusion Stop Time: 1320  Post Infusion IV Care: Peripheral IV Discontinued  Discharge: Condition: Good, Destination: Home . AVS Declined  Performed by:  Amantha Sklar, RN

## 2024-03-25 ENCOUNTER — Other Ambulatory Visit: Payer: Self-pay | Admitting: Internal Medicine

## 2024-03-25 DIAGNOSIS — M109 Gout, unspecified: Secondary | ICD-10-CM

## 2024-04-26 ENCOUNTER — Encounter: Payer: Self-pay | Admitting: Internal Medicine

## 2024-04-27 ENCOUNTER — Ambulatory Visit

## 2024-04-27 ENCOUNTER — Encounter: Payer: Self-pay | Admitting: Internal Medicine

## 2024-04-27 VITALS — BP 116/70 | HR 57 | Temp 97.5°F | Resp 16 | Ht <= 58 in | Wt 207.4 lb

## 2024-04-27 DIAGNOSIS — K50111 Crohn's disease of large intestine with rectal bleeding: Secondary | ICD-10-CM

## 2024-04-27 MED ORDER — DIPHENHYDRAMINE HCL 25 MG PO CAPS
25.0000 mg | ORAL_CAPSULE | Freq: Once | ORAL | Status: AC
  Administered 2024-04-27: 25 mg via ORAL

## 2024-04-27 MED ORDER — SODIUM CHLORIDE 0.9 % IV SOLN
10.0000 mg/kg | Freq: Once | INTRAVENOUS | Status: AC
  Administered 2024-04-27: 900 mg via INTRAVENOUS
  Filled 2024-04-27: qty 90

## 2024-04-27 MED ORDER — ACETAMINOPHEN 325 MG PO TABS
650.0000 mg | ORAL_TABLET | Freq: Once | ORAL | Status: AC
  Administered 2024-04-27: 650 mg via ORAL

## 2024-04-27 NOTE — Progress Notes (Signed)
 Diagnosis: Crohn's Disease  Provider:  Mannam, Praveen MD  Procedure: IV Infusion  IV Type: Peripheral, IV Location: L Antecubital  Remicade  (Infliximab ), Dose: 900 mg  Infusion Start Time: 1111  Infusion Stop Time: 1315  Post Infusion IV Care: Peripheral IV Discontinued  Discharge: Condition: Good, Destination: Home . AVS Declined  Performed by:  Candiace West E, RN

## 2024-05-04 ENCOUNTER — Encounter: Payer: Self-pay | Admitting: Internal Medicine

## 2024-05-04 ENCOUNTER — Ambulatory Visit: Admitting: Internal Medicine

## 2024-05-04 VITALS — BP 131/68 | HR 67 | Temp 97.4°F | Resp 12 | Ht <= 58 in | Wt 211.0 lb

## 2024-05-04 DIAGNOSIS — K626 Ulcer of anus and rectum: Secondary | ICD-10-CM | POA: Diagnosis not present

## 2024-05-04 DIAGNOSIS — K635 Polyp of colon: Secondary | ICD-10-CM | POA: Diagnosis not present

## 2024-05-04 DIAGNOSIS — K5010 Crohn's disease of large intestine without complications: Secondary | ICD-10-CM | POA: Diagnosis not present

## 2024-05-04 DIAGNOSIS — K624 Stenosis of anus and rectum: Secondary | ICD-10-CM

## 2024-05-04 DIAGNOSIS — K50119 Crohn's disease of large intestine with unspecified complications: Secondary | ICD-10-CM

## 2024-05-04 DIAGNOSIS — D123 Benign neoplasm of transverse colon: Secondary | ICD-10-CM

## 2024-05-04 MED ORDER — SODIUM CHLORIDE 0.9 % IV SOLN: 500.0000 mL | INTRAVENOUS | Status: DC

## 2024-05-04 NOTE — Progress Notes (Unsigned)
 "   GASTROENTEROLOGY PROCEDURE H&P NOTE   Primary Care Physician: Daryl Setter, NP    Reason for Procedure:  Crohn's disease of the colon and perianal region  Plan:    Colonoscopy  Patient is appropriate for endoscopic procedure(s) in the ambulatory ([facility]) setting.  The nature of the procedure, as well as the risks, benefits, and alternatives were carefully and thoroughly reviewed with the patient. Ample time for discussion and questions allowed.  All questions were answered. The patient understood, was satisfied, and agreed with the plan to proceed.    HPI: Kathy [name] is a 70 y.o. female who presents for surveillance colonoscopy.  Medical history as below.  Tolerated the prep.  No recent chest pain or shortness of breath.  No abdominal pain today.  Past Medical History:  Diagnosis Date   Anal stenosis    Arthritis    Asthma    09-23-2017  per pt has not done inhaler since May 2018, no insurance (QVAR bid and Ventolin  prn)   B12 deficiency 01/05/2021   Chronic kidney disease    Crohn disease (HCC)    Diastolic dysfunction    per echo 06-23-2009  grade 2 diastolic dysfunction , ef 60-65%   Esophagitis    GERD (gastroesophageal reflux disease)    Hemorrhoids    Hiatal hernia    History of migraine 07/17/2008   Qualifier: Diagnosis of   By: Tanda MD, LauraLee         Hyperlipidemia    stopped meds due to side effects   Hypertension    09-23-2017  per pt has takes bp meds since May 2018 no insurance (losartan 100mg  qd, HCTZ 12.5mg  qd, and toprol  50 qd)   Microalbuminuria 06/01/2006   Qualifier: Diagnosis of  By: Tobie MD, Sejal     Type 2 diabetes mellitus (HCC)    09-23-2017 per pt has not taken diabetic meds since May 2018, no insurance (kombiglyze 5-1000mg  qd)    Past Surgical History:  Procedure Laterality Date   BREAST BIOPSY Bilateral    CARDIOVASCULAR STRESS TEST  07/29/2009   normal nuclear study w/ no ischemia/  normal LV function and wall  motion,  ef 70%   CESAREAN SECTION  1980s   RECTAL BIOPSY N/A 10/03/2017   Procedure: POSSIBLE BIOPSY RECTAL;  Surgeon: Teresa Lonni HERO, MD;  Location: [facility];  Service: General;  Laterality: N/A;    Prior to Admission medications  Medication Sig Start Date End Date Taking? Authorizing Provider  albuterol  (VENTOLIN  HFA) 108 (90 Base) MCG/ACT inhaler Inhale 1 puff into the lungs every 6 (six) hours as needed for wheezing or shortness of breath. 03/10/22   O'Sullivan, Melissa, NP  allopurinol  (ZYLOPRIM ) 100 MG tablet Take 1 tablet (100 mg total) by mouth daily. 09/08/23   O'Sullivan, Melissa, NP  amLODipine  (NORVASC ) 10 MG tablet Take 1 tablet (10 mg total) by mouth daily. 09/26/23   O'Sullivan, Melissa, NP  azaTHIOprine  (IMURAN ) 50 MG tablet Take 2 tablets (100 mg total) by mouth daily. 03/26/24   Esmae Donathan, Gordy HERO, MD  chlorthalidone (HYGROTON) 25 MG tablet Take 25 mg by mouth daily. 03/05/24   [provider]  clotrimazole -betamethasone  (LOTRISONE ) cream Apply 1 application topically 2 (two) times daily. 08/06/19   Krystie Leiter, Gordy HERO, MD  colchicine  0.6 MG tablet Take 2 tabs by mouth now and 1 tab in 1 hour. 05/27/20   O'Sullivan, Melissa, NP  colchicine  0.6 MG tablet Take 1.2 mg once, then 0.6 mg an hour later.  May take 1 tablet daily for 2-3 more days if needed for gout pain 11/23/23   Daryl Setter, NP  Continuous Glucose Receiver (FREESTYLE LIBRE 3 READER) DEVI Use as directed to monitor blood sugar 01/24/23   Daryl Setter, NP  Continuous Glucose Sensor (FREESTYLE LIBRE 3 SENSOR) MISC Use as directed to monitor blood sugar. Change every 14 days 11/23/23   O'Sullivan, Melissa, NP  cyanocobalamin  (VITAMIN B12) 1000 MCG tablet Take 1 tablet (1,000 mcg total) by mouth daily. 08/24/23   O'Sullivan, Melissa, NP  dicyclomine  (BENTYL ) 10 MG capsule TAKE 1 CAPSULE BY MOUTH THREE TIMES DAILY AS NEEDED FOR CRAMPS 02/08/24   Jennipher Weatherholtz, Gordy HERO, MD  glipiZIDE  (GLUCOTROL  XL) 10 MG 24 hr tablet Take 1  tablet (10 mg total) by mouth daily with breakfast. 11/23/23   Daryl Setter, NP  hydrocortisone  (ANUSOL -HC) 25 MG suppository Place 1 suppository (25 mg total) rectally at bedtime. 08/17/19   Aryaa Bunting, Gordy HERO, MD  Iron , Ferrous Sulfate , 325 (65 Fe) MG TABS Take 325 mg by mouth every other day. 11/08/22   O'Sullivan, Melissa, NP  lubiprostone  (AMITIZA ) 24 MCG capsule Take 1 capsule (24 mcg total) by mouth 2 (two) times daily with a meal. 09/28/23   Dawon Troop, Gordy HERO, MD  metoprolol  succinate (TOPROL -XL) 50 MG 24 hr tablet Take 1 tablet (50 mg total) by mouth daily. Take with or immediately following a meal. 11/23/23   O'Sullivan, Melissa, NP  Multiple Vitamins-Minerals (MULTIVITAMIN WITH MINERALS) tablet Take 1 tablet by mouth daily. 02/11/20   O'Sullivan, Melissa, NP  omeprazole  (PRILOSEC) 40 MG capsule TAKE 1 CAPSULE BY MOUTH ONCE DAILY 08/31/23   Mccall Will, Gordy HERO, MD  polyethylene glycol (MIRALAX  / GLYCOLAX ) 17 g packet Take 17 g by mouth as needed. 1 capful prn 02/28/19   Stallings, Zoe A, MD  REMICADE  100 MG injection INFUSE 1000MG  IV EVERY 6 WEEKS 11/03/23   Jamine Highfill, Gordy HERO, MD  simvastatin  (ZOCOR ) 10 MG tablet Take 1 tablet (10 mg total) by mouth daily. 11/23/23   O'Sullivan, Melissa, NP  sitaGLIPtin  (JANUVIA ) 50 MG tablet Take 1 tablet (50 mg total) by mouth daily. 11/23/23   O'Sullivan, Melissa, NP  valsartan  (DIOVAN ) 40 MG tablet Take 1 tablet (40 mg total) by mouth daily. 11/23/23   O'Sullivan, Melissa, NP  Vitamin D , Ergocalciferol , (DRISDOL ) 1.25 MG (50000 UNIT) CAPS capsule Take 1 capsule (50,000 Units total) by mouth every 7 (seven) days. 12/13/23   O'Sullivan, Melissa, NP    Current Outpatient Medications  Medication Sig Dispense Refill   albuterol  (VENTOLIN  HFA) 108 (90 Base) MCG/ACT inhaler Inhale 1 puff into the lungs every 6 (six) hours as needed for wheezing or shortness of breath. 18 g 5   allopurinol  (ZYLOPRIM ) 100 MG tablet Take 1 tablet (100 mg total) by mouth daily. 30 tablet 6   amLODipine   (NORVASC ) 10 MG tablet Take 1 tablet (10 mg total) by mouth daily. 90 tablet 1   azaTHIOprine  (IMURAN ) 50 MG tablet Take 2 tablets (100 mg total) by mouth daily. 180 tablet 1   chlorthalidone (HYGROTON) 25 MG tablet Take 25 mg by mouth daily.     clotrimazole -betamethasone  (LOTRISONE ) cream Apply 1 application topically 2 (two) times daily. 30 g 1   colchicine  0.6 MG tablet Take 2 tabs by mouth now and 1 tab in 1 hour. 6 tablet 1   colchicine  0.6 MG tablet Take 1.2 mg once, then 0.6 mg an hour later.  May take 1 tablet daily for 2-3 more days if  needed for gout pain 15 tablet 0   Continuous Glucose Receiver (FREESTYLE LIBRE 3 READER) DEVI Use as directed to monitor blood sugar 1 each 0   Continuous Glucose Sensor (FREESTYLE LIBRE 3 SENSOR) MISC Use as directed to monitor blood sugar. Change every 14 days 2 each 5   cyanocobalamin  (VITAMIN B12) 1000 MCG tablet Take 1 tablet (1,000 mcg total) by mouth daily.     dicyclomine  (BENTYL ) 10 MG capsule TAKE 1 CAPSULE BY MOUTH THREE TIMES DAILY AS NEEDED FOR CRAMPS 90 capsule 0   glipiZIDE  (GLUCOTROL  XL) 10 MG 24 hr tablet Take 1 tablet (10 mg total) by mouth daily with breakfast. 90 tablet 1   hydrocortisone  (ANUSOL -HC) 25 MG suppository Place 1 suppository (25 mg total) rectally at bedtime. 14 suppository 0   Iron , Ferrous Sulfate , 325 (65 Fe) MG TABS Take 325 mg by mouth every other day.     lubiprostone  (AMITIZA ) 24 MCG capsule Take 1 capsule (24 mcg total) by mouth 2 (two) times daily with a meal. 60 capsule 11   metoprolol  succinate (TOPROL -XL) 50 MG 24 hr tablet Take 1 tablet (50 mg total) by mouth daily. Take with or immediately following a meal. 90 tablet 1   Multiple Vitamins-Minerals (MULTIVITAMIN WITH MINERALS) tablet Take 1 tablet by mouth daily.     omeprazole  (PRILOSEC) 40 MG capsule TAKE 1 CAPSULE BY MOUTH ONCE DAILY 90 capsule 3   polyethylene glycol (MIRALAX  / GLYCOLAX ) 17 g packet Take 17 g by mouth as needed. 1 capful prn 30 each 11    REMICADE  100 MG injection INFUSE 1000MG  IV EVERY 6 WEEKS 10 each 4   simvastatin  (ZOCOR ) 10 MG tablet Take 1 tablet (10 mg total) by mouth daily. 90 tablet 1   sitaGLIPtin  (JANUVIA ) 50 MG tablet Take 1 tablet (50 mg total) by mouth daily. 90 tablet 1   valsartan  (DIOVAN ) 40 MG tablet Take 1 tablet (40 mg total) by mouth daily. 90 tablet 1   Vitamin D , Ergocalciferol , (DRISDOL ) 1.25 MG (50000 UNIT) CAPS capsule Take 1 capsule (50,000 Units total) by mouth every 7 (seven) days. 12 capsule 0   Current Facility-Administered Medications  Medication Dose Route Frequency Provider Last Rate Last Admin   0.9 %  sodium chloride  infusion  500 mL Intravenous Continuous Tracy Kinner, Gordy HERO, MD        Allergies as of 05/04/2024 - Review Complete 05/04/2024  Allergen Reaction Noted   Metformin Nausea Only 07/02/2022   Lipitor [atorvastatin calcium] Other (See Comments) 01/10/2018   Lisinopril Itching 07/15/2008   Pravachol [pravastatin sodium]  01/10/2018   Pravastatin sodium Other (See Comments)     Family History  Problem Relation Age of Onset   Hypertension Mother 24   Stroke Mother 87   Diabetes Maternal Aunt        s/p bilater amputation due to DM   Asthma Father    Colon cancer Neg Hx    Esophageal cancer Neg Hx    Stomach cancer Neg Hx    Rectal cancer Neg Hx     Social History   Socioeconomic History   Marital status: Married    Spouse name: Not on file   Number of children: 2   Years of education: Not on file   Highest education level: Not on file  Occupational History   Occupation: unemployed  Tobacco Use   Smoking status: Never   Smokeless tobacco: Never  Vaping Use   Vaping status: Never Used  Substance and Sexual  Activity   Alcohol use: No   Drug use: Never   Sexual activity: Not Currently  Other Topics Concern   Not on file  Social History Narrative   Not working currently. Retired in 2019. Previously worked in [employer] job   Married    2 children (son and  daughter). They are both local   She has 1 grand-daughter and 1 grand-son   No pets (allergic)   Completed 4 years of college.   Social Drivers of Health   Tobacco Use: Low Risk (05/04/2024)   Patient History    Smoking Tobacco Use: Never    Smokeless Tobacco Use: Never    Passive Exposure: Not on file  Financial Resource Strain: Low Risk (11/15/2023)   Overall Financial Resource Strain (CARDIA)    Difficulty of Paying Living Expenses: Not hard at all  Food Insecurity: No Food Insecurity (11/15/2023)   Epic    Worried About Radiation Protection Practitioner of Food in the Last Year: Never true    Ran Out of Food in the Last Year: Never true  Transportation Needs: No Transportation Needs (11/15/2023)   Epic    Lack of Transportation (Medical): No    Lack of Transportation (Non-Medical): No  Physical Activity: Inactive (11/15/2023)   Exercise Vital Sign    Days of Exercise per Week: 0 days    Minutes of Exercise per Session: 0 min  Stress: No Stress Concern Present (11/15/2023)   [employer] of Occupational Health - Occupational Stress Questionnaire    Feeling of Stress: Not at all  Social Connections: Moderately Integrated (11/15/2023)   Social Connection and Isolation Panel    Frequency of Communication with Friends and Family: More than three times a week    Frequency of Social Gatherings with Friends and Family: Twice a week    Attends Religious Services: More than 4 times per year    Active Member of Clubs or Organizations: No    Attends Banker Meetings: Never    Marital Status: Married  Catering Manager Violence: Not At Risk (11/15/2023)   Epic    Fear of Current or Ex-Partner: No    Emotionally Abused: No    Physically Abused: No    Sexually Abused: No  Depression (PHQ2-9): Low Risk (11/15/2023)   Depression (PHQ2-9)    PHQ-2 Score: 0  Alcohol Screen: Low Risk (11/15/2023)   Alcohol Screen    Last Alcohol Screening Score (AUDIT): 0  Housing: Unknown (11/15/2023)   Epic     Unable to Pay for Housing in the Last Year: No    Number of Times Moved in the Last Year: Not on file    Homeless in the Last Year: No  Utilities: Not At Risk (11/15/2023)   Epic    Threatened with loss of utilities: No  Health Literacy: Adequate Health Literacy (11/15/2023)   B1300 Health Literacy    Frequency of need for help with medical instructions: Never    Physical Exam: Vital signs in last 24 hours: @BP  125/89   Pulse 67   Temp (!) 97.4 F (36.3 C) (Temporal)   Ht 4' 10 (1.473 m)   Wt 211 lb (95.7 kg)   SpO2 99%   BMI 44.10 kg/m  GEN: NAD EYE: Sclerae anicteric ENT: MMM CV: Non-tachycardic Pulm: CTA b/l GI: Soft, NT/ND NEURO:  Alert & Oriented x 3   Gordy Starch, MD [facility]  05/04/2024 3:14 PM  "

## 2024-05-04 NOTE — Patient Instructions (Signed)
 "  Continue previous diet & medications  Continue current doses and intervals for Infliximab  and Azathioprine   Await pathology results on polyps removed and biopsies done   Repeat colonoscopy date to be determined after pathology results reviewed       YOU HAD AN ENDOSCOPIC PROCEDURE TODAY AT THE [facility]:   Refer to the procedure report that was given to you for any specific questions about what was found during the examination.  If the procedure report does not answer your questions, please call your gastroenterologist to clarify.  If you requested that your care partner not be given the details of your procedure findings, then the procedure report has been included in a sealed envelope for you to review at your convenience later.  YOU SHOULD EXPECT: Some feelings of bloating in the abdomen. Passage of more gas than usual.  Walking can help get rid of the air that was put into your GI tract during the procedure and reduce the bloating. If you had a lower endoscopy (such as a colonoscopy or flexible sigmoidoscopy) you may notice spotting of blood in your stool or on the toilet paper. If you underwent a bowel prep for your procedure, you may not have a normal bowel movement for a few days.  Please Note:  You might notice some irritation and congestion in your nose or some drainage.  This is from the oxygen used during your procedure.  There is no need for concern and it should clear up in a day or so.  SYMPTOMS TO REPORT IMMEDIATELY:  Following lower endoscopy (colonoscopy or flexible sigmoidoscopy):  Excessive amounts of blood in the stool  Significant tenderness or worsening of abdominal pains  Swelling of the abdomen that is new, acute  Fever of 100F or higher  Following upper endoscopy (EGD)  Vomiting of blood or coffee ground material  New chest pain or pain under the shoulder blades  Painful or persistently difficult swallowing  New shortness of breath  Fever of  100F or higher  Black, tarry-looking stools  For urgent or emergent issues, a gastroenterologist can be reached at any hour by calling [phone]. Do not use MyChart messaging for urgent concerns.    DIET:  We do recommend a small meal at first, but then you may proceed to your regular diet.  Drink plenty of fluids but you should avoid alcoholic beverages for 24 hours.  ACTIVITY:  You should plan to take it easy for the rest of today and you should NOT DRIVE or use heavy machinery until tomorrow (because of the sedation medicines used during the test).    FOLLOW UP: Our staff will call the number listed on your records the next business day following your procedure.  We will call around 7:15- 8:00 am to check on you and address any questions or concerns that you may have regarding the information given to you following your procedure. If we do not reach you, we will leave a message.     If any biopsies were taken you will be contacted by phone or by letter within the next 1-3 weeks.  Please call us  at [phone] if you have not heard about the biopsies in 3 weeks.    SIGNATURES/CONFIDENTIALITY: You and/or your care partner have signed paperwork which will be entered into your electronic medical record.  These signatures attest to the fact that that the information above on your After Visit Summary has been reviewed and is understood.  Full  responsibility of the confidentiality of this discharge information lies with you and/or your care-partner. "

## 2024-05-04 NOTE — Progress Notes (Unsigned)
 Called to room to assist during endoscopic procedure.  Patient ID and intended procedure confirmed with present staff. Received instructions for my participation in the procedure from the performing physician.

## 2024-05-04 NOTE — Op Note (Signed)
 [facility] Patient Name: Kathy [name] Procedure Date: 05/04/2024 3:12 PM MRN: [id number] Endoscopist: Gordy CHRISTELLA Starch , MD, [id number] Age: 69 Referring MD:  Date of Birth: 1956/01/27 Gender: Female Account #: [id number] Procedure:                Colonoscopy Indications:              Disease activity assessment of Crohn's disease of                            the colon and perianal region, Assess therapeutic                            response to therapy of Crohn's disease of the                            colon/perianal region; Current therapy is                            infliximab  10 mg/kg every 6 weeks with last                            infliximab  trough level 8.4, azathioprine  in the                            presence of allopurinol  with metabolite panel                            showing 6-TG level 399; known; Anal stenosis with                            impaired defecation and constipation secondary to                            stenosis; Incidentally patient noted significant                            improvement in her abdominal bloating and                            discomfort after bowel prep today. Medicines:                Monitored Anesthesia Care Procedure:                Pre-Anesthesia Assessment:                           - Prior to the procedure, a History and Physical                            was performed, and patient medications and                            allergies were reviewed. The patient's tolerance of  previous anesthesia was also reviewed. The risks                            and benefits of the procedure and the sedation                            options and risks were discussed with the patient.                            All questions were answered, and informed consent                            was obtained. Prior Anticoagulants: The patient has                            taken no anticoagulant or  antiplatelet agents. ASA                            Grade Assessment: III - A patient with severe                            systemic disease. After reviewing the risks and                            benefits, the patient was deemed in satisfactory                            condition to undergo the procedure.                           After obtaining informed consent, the colonoscope                            was passed under direct vision. Throughout the                            procedure, the patient's blood pressure, pulse, and                            oxygen saturations were monitored continuously. The                            [id number]-[id number] Slim SN [id number] was introduced through                            the anus and advanced to the cecum, identified by                            the ileocecal valve. The colonoscopy was performed                            without difficulty. The patient tolerated the  procedure well. The quality of the bowel                            preparation was good. The ileocecal valve,                            appendiceal orifice, and rectum were photographed. Scope In: 3:33:41 PM Scope Out: 3:55:27 PM Scope Withdrawal Time: 0 hours 13 minutes 13 seconds  Total Procedure Duration: 0 hours 21 minutes 46 seconds  Findings:                 The digital rectal exam findings include anal                            stricture. Severe stenosis gentle dilated by                            digital exam and then with the UltraSlim                            colonoscope.                           Two sessile polyps were found in the transverse                            colon. The polyps were 2 to 3 mm in size. These                            polyps were removed with a cold snare. Resection                            and retrieval were complete.                           The recto-sigmoid colon, sigmoid colon, descending                             colon, transverse colon, ascending colon and cecum                            appeared normal.                           Inflammation characterized by friability,                            granularity and serpentine ulcerations was found in                            a continuous and circumferential pattern in the mid                            and distal rectum. The inflammation was moderate in  severity in the rectum only, and when compared to                            previous examinations, the findings are improved.                            Biopsies were taken with a cold forceps for                            histology.                           An area of moderately nodular mucosa was found at                            the anus. Biopsies were taken with a cold forceps                            for histology. Complications:            No immediate complications. Estimated Blood Loss:     Estimated blood loss was minimal. Impression:               - Anal stricture found on digital rectal exam.                            Dilated with digital exam and with the scope.                           - Two 2 to 3 mm polyps in the transverse colon,                            removed with a cold snare. Resected and retrieved.                           - The sigmoid colon, descending colon, transverse                            colon, ascending colon, cecum and recto-sigmoid                            colon are normal.                           - Crohn's disease. Inflammation was found in the                            mid and distal rectum. This was moderate in                            severity, improved compared to previous                            examinations. Biopsied.                           -  Nodular mucosa at the anus. Biopsied. Recommendation:           - Patient has a contact number available for                            emergencies. The  signs and symptoms of potential                            delayed complications were discussed with the                            patient. Return to normal activities tomorrow.                            Written discharge instructions were provided to the                            patient.                           - Resume previous diet.                           - Continue present medications. Continue current                            doses and intervals for infliximab  and azathioprine .                           - Await pathology results.                           - Await response to anal dilation today. If issues                            persist I would like the patient to follow-up with                            Dr. Teresa for EUA and surgical management/dilation                            of anal stenosis due to Crohn's.                           - Repeat colonoscopy is recommended for                            surveillance. The colonoscopy date will be                            determined after pathology results from today's                            exam become available for review. Gordy CHRISTELLA Starch, MD 05/04/2024 4:07:45 PM This report has been signed electronically.

## 2024-05-04 NOTE — Progress Notes (Unsigned)
 To pacu, VSS. Report to [email or web address]

## 2024-05-07 ENCOUNTER — Telehealth: Payer: Self-pay

## 2024-05-07 NOTE — Telephone Encounter (Signed)
 No answer on follow up call - after a few rings, call went to busy

## 2024-05-10 LAB — SURGICAL PATHOLOGY

## 2024-05-11 ENCOUNTER — Ambulatory Visit: Payer: Self-pay | Admitting: Internal Medicine

## 2024-05-11 NOTE — Progress Notes (Signed)
 [place],  Kathy [name] has developed severe anal stenosis from her Crohn's.  I have been managing this aggressively medically and we have her on maximum therapy. The disease is much better, but the anal stenosis is affecting her daily QOL. Would there be surgical option for anal stenosis?  I have dilated it manually under MAC, but the period of relief is rather short.  It is painful enough that I do not think she could tolerate self-dilators at home. Still some disease activity in the distal rectum and anal canal. Thanks Gordy

## 2024-05-22 ENCOUNTER — Telehealth: Payer: Self-pay | Admitting: Pharmacy Technician

## 2024-05-22 ENCOUNTER — Other Ambulatory Visit: Payer: Self-pay | Admitting: Internal Medicine

## 2024-05-22 NOTE — Telephone Encounter (Signed)
 PAP: APPROVED  Received notice from [facility] patients that have been already established on Remicade  will be grand-fathered and will not have to change treatment therapy. Patient will remain on Remicade  (free drug) Exp. 12.31.26

## 2024-05-28 ENCOUNTER — Other Ambulatory Visit: Payer: Self-pay | Admitting: Family

## 2024-05-28 ENCOUNTER — Encounter: Payer: Self-pay | Admitting: Internal Medicine

## 2024-05-28 DIAGNOSIS — I119 Hypertensive heart disease without heart failure: Secondary | ICD-10-CM

## 2024-05-28 DIAGNOSIS — M109 Gout, unspecified: Secondary | ICD-10-CM

## 2024-06-06 ENCOUNTER — Ambulatory Visit: Admitting: Internal Medicine

## 2024-06-07 ENCOUNTER — Ambulatory Visit: Payer: Self-pay

## 2024-11-20 ENCOUNTER — Ambulatory Visit
